# Patient Record
Sex: Female | Born: 1940
Health system: Southern US, Community
[De-identification: ages and names within clinical notes are randomized; demographics above are authoritative.]

## PROBLEM LIST (undated history)

## (undated) DIAGNOSIS — I10 Essential (primary) hypertension: Secondary | ICD-10-CM

## (undated) DIAGNOSIS — M1990 Unspecified osteoarthritis, unspecified site: Secondary | ICD-10-CM

## (undated) DIAGNOSIS — I639 Cerebral infarction, unspecified: Secondary | ICD-10-CM

## (undated) DIAGNOSIS — E7800 Pure hypercholesterolemia, unspecified: Secondary | ICD-10-CM

## (undated) HISTORY — PX: REPLACEMENT TOTAL KNEE: SUR1224

## (undated) HISTORY — DX: Cerebral infarction, unspecified: I63.9

---

## 1999-07-06 ENCOUNTER — Other Ambulatory Visit: Admission: RE | Admit: 1999-07-06 | Discharge: 1999-07-06 | Payer: Self-pay | Admitting: Family Medicine

## 2000-09-09 ENCOUNTER — Other Ambulatory Visit: Admission: RE | Admit: 2000-09-09 | Discharge: 2000-09-09 | Payer: Self-pay | Admitting: Family Medicine

## 2001-02-13 ENCOUNTER — Encounter: Payer: Self-pay | Admitting: Family Medicine

## 2001-02-13 ENCOUNTER — Encounter: Admission: RE | Admit: 2001-02-13 | Discharge: 2001-02-13 | Payer: Self-pay | Admitting: Family Medicine

## 2001-10-09 ENCOUNTER — Encounter: Admission: RE | Admit: 2001-10-09 | Discharge: 2001-10-09 | Payer: Self-pay | Admitting: Family Medicine

## 2001-10-09 ENCOUNTER — Encounter: Payer: Self-pay | Admitting: Family Medicine

## 2005-09-22 ENCOUNTER — Other Ambulatory Visit: Admission: RE | Admit: 2005-09-22 | Discharge: 2005-09-22 | Payer: Self-pay | Admitting: Family Medicine

## 2006-04-01 ENCOUNTER — Encounter: Admission: RE | Admit: 2006-04-01 | Discharge: 2006-04-01 | Payer: Self-pay | Admitting: Family Medicine

## 2006-05-03 ENCOUNTER — Ambulatory Visit (HOSPITAL_COMMUNITY): Admission: RE | Admit: 2006-05-03 | Discharge: 2006-05-03 | Payer: Self-pay | Admitting: Gastroenterology

## 2006-12-12 ENCOUNTER — Other Ambulatory Visit: Admission: RE | Admit: 2006-12-12 | Discharge: 2006-12-12 | Payer: Self-pay | Admitting: Family Medicine

## 2007-05-16 ENCOUNTER — Encounter: Admission: RE | Admit: 2007-05-16 | Discharge: 2007-05-16 | Payer: Self-pay | Admitting: Family Medicine

## 2007-08-29 ENCOUNTER — Inpatient Hospital Stay (HOSPITAL_COMMUNITY): Admission: RE | Admit: 2007-08-29 | Discharge: 2007-09-03 | Payer: Self-pay | Admitting: Orthopedic Surgery

## 2008-05-15 ENCOUNTER — Ambulatory Visit (HOSPITAL_BASED_OUTPATIENT_CLINIC_OR_DEPARTMENT_OTHER): Admission: RE | Admit: 2008-05-15 | Discharge: 2008-05-16 | Payer: Self-pay | Admitting: Orthopedic Surgery

## 2008-06-25 ENCOUNTER — Encounter: Admission: RE | Admit: 2008-06-25 | Discharge: 2008-06-25 | Payer: Self-pay | Admitting: Family Medicine

## 2008-12-20 ENCOUNTER — Other Ambulatory Visit: Admission: RE | Admit: 2008-12-20 | Discharge: 2008-12-20 | Payer: Self-pay | Admitting: Family Medicine

## 2009-07-07 ENCOUNTER — Encounter: Admission: RE | Admit: 2009-07-07 | Discharge: 2009-07-07 | Payer: Self-pay | Admitting: Family Medicine

## 2009-09-16 ENCOUNTER — Ambulatory Visit: Payer: Self-pay | Admitting: Gynecology

## 2009-10-06 ENCOUNTER — Ambulatory Visit: Payer: Self-pay | Admitting: Gynecology

## 2009-10-09 ENCOUNTER — Ambulatory Visit (HOSPITAL_BASED_OUTPATIENT_CLINIC_OR_DEPARTMENT_OTHER): Admission: RE | Admit: 2009-10-09 | Discharge: 2009-10-09 | Payer: Self-pay | Admitting: Gynecology

## 2009-10-09 ENCOUNTER — Ambulatory Visit: Payer: Self-pay | Admitting: Gynecology

## 2009-10-23 ENCOUNTER — Ambulatory Visit: Payer: Self-pay | Admitting: Gynecology

## 2010-07-16 ENCOUNTER — Encounter: Admission: RE | Admit: 2010-07-16 | Discharge: 2010-07-16 | Payer: Self-pay | Admitting: Family Medicine

## 2010-11-16 ENCOUNTER — Encounter: Payer: Self-pay | Admitting: Family Medicine

## 2011-03-09 NOTE — Op Note (Signed)
Tammy Arnold, Tammy Arnold               ACCOUNT NO.:  [id number]   MEDICAL RECORD NO.:  [id number]          PATIENT TYPE:  INP   LOCATION:  0012                         FACILITY:  [facility]   PHYSICIAN:  Marlowe Kays, M.D.  DATE OF BIRTH:  07/10/1941   DATE OF PROCEDURE:  08/29/2007  DATE OF DISCHARGE:                               OPERATIVE REPORT   PREOPERATIVE DIAGNOSIS:  Osteoarthritis, right knee.   POSTOPERATIVE DIAGNOSIS:  Osteoarthritis, right knee.   OPERATION:  Osteonics total knee replacement, right.   SURGEON:  Marlowe Kays, M.D.   ASSISTANTDruscilla Brownie. Underwood, P.A.-C.   ANESTHESIA:  Spinal.   JUSTIFICATION FOR PROCEDURE:  She had tricompartmental arthritis but  mainly in the medial joint.  There was almost bone-on-bone abutment. She  had had a total complement of nonsurgical treatment including viscous  supplementation.   DESCRIPTION OF PROCEDURE:  Prophylactic antibiotics, satisfactory spinal  anesthesia, Foley catheter inserted.  Lateral hip stabilizer, pneumatic  tourniquet, a time out performed.  The right leg was prepped with  DuraPrep from tourniquet to ankle and draped in a sterile field.  Ioban  employed.  Vertical midline incision down to the patellar mechanism with  median parapatellar incision to open the joint.  Undermining the pes  anserinus and medial collateral ligament, freed up the patellar  mechanism, and was then able to evert the patella and flex the knee.  Small osteophytes from around the patella and the femur were removed.  She had severe wear in the medial femoral condyle in particular, and  also a good bit of patellar and lateral compartment wear, as depicted on  x-ray. I removed remnants of the menisci, ACL, and PCL complex. I then  made 5/16 drill hole in the distal femur followed by the canal finder  and the axis liner set for 5 degrees valgus cut.  She had a substantial  flexion contracture and I elected to take 12 mm off the distal  femur.  I  then used the sizing jig and using the jig, she measured out at a size 9  for the femur but clearly this was too large a prosthesis for her femur  based on placing a trial against the distal femur since it overlapped  medially and laterally where as a 7 was a perfect size.  Accordingly, I  elected to use the guide for making the cuts for a 7 prosthesis and  after placing the scribe lines on the distal femur with the guide, I  then used a distal cutting jig and made anterior and posterior cuts and  posterior anterior chamfers. The anterior cut did necessitate taking a  small amount of cortical femur in order to accommodate the 7 size on the  distal femur.  I then went to the tibia where I made a leveling cut,  sized the tibia at #7, and did my initial intramedullary drill hole  followed by step cut drill canal finder, and then placed the  intramedullary rod and the cutting jig set for 90 degrees cut 4 mm off  the depressed medial tibial plateau. After making  this cut, I then  placed a laminar spreader to remove remnants of bone and soft tissue  from behind the condyles and then placed the jig for creating the  patellar groove and also the box cut for the post.  I first used a saw  to remove some bone from the intercondylar area to minimize the risk of  fracture.  After creating the post hole, I then went to the patella  which was sized at 26.  I used a 10 mm recessed jig followed by the  guide for creating the three fixation holes.  I then placed the trial  patella and trimmed bone around the perimeter.  I then returned to the  tibia and we had previously gone through a trial reduction and found a  10 spacer was a little too thick and I re-cut an additional 2 mm off the  proximal tibia and the 10 mm spacer was perfect.  I used the external  guide splitting the bimalleolar distance and put scribe lines on the  anterior tibia for the base plate.  This was base plate was then  applied  and using the tripod apparatus, I reamed up to a 7 cemented. She did not  have a significant varus deformity but because of her medial compartment  narrowing and some osteopenia, I felt that a small stem would facilitate  stability and I elected to go with a 40 mm stem because of her size.  I  used the reamer to ream for this and then went through a trial reduction  and found this fit nicely.  I water picked the knee while the components  were assembled and the 40 mm extension was placed on the tibial  component.  I then glued in the components individually, starting first  with the tibia, impacting, and removing all methyl methacrylate from  around the perimeter.  I then followed this with the femur and held the  knee in extension with the 10 mm spacer as we glued in the patella which  I held with a patellar holding clamp.  When the methacrylate had  hardened, we removed small amounts of methacrylate from around the  components and I went through a trial reduction with the 12 mm spacer  which left a little bit of a flexion contracture, so I felt that the 10  mm spacer was the correct thickness. Accordingly, after irrigating the  wound well and checking to be sure that there were no remaining  follicles of methacrylate remaining to interfere with mechanics, I  placed the final 10 mm posterior stabilized spacer, reduced the knee,  found it to be nice and stable, a small lateral release was performed, a  Hemovac inserted, and the wound closed in multiple layers with #1 Vicryl  in two layers in the quadriceps tendon and distally in two layers with  the synovium and the capsule.  The subcutaneous tissue was closed with a  combination of #1 and 2-0 Vicryl, staples in the skin.  A dry sterile  dressing was applied.  It should be noted that I placed bone wax on all  of the bone prior to closure.  The tourniquet was then released with  slightly less than 2 hours of tourniquet time having  elapsed.  She  tolerated the procedure well and was taken to the recovery room in  satisfactory condition with no known complications.           ______________________________  Marlowe Kays, M.D.  JA/MEDQ  D:  08/29/2007  T:  08/29/2007  Job:  [id number]

## 2011-03-09 NOTE — H&P (Signed)
Tammy Arnold, Tammy Arnold               ACCOUNT NO.:  [id number]   MEDICAL RECORD NO.:  [id number]          PATIENT TYPE:  INP   LOCATION:  NA                           FACILITY:  [facility]   PHYSICIAN:  Marlowe Kays, M.D.  DATE OF BIRTH:  07/23/1941   DATE OF ADMISSION:  08/29/2007  DATE OF DISCHARGE:                              HISTORY & PHYSICAL   CHIEF COMPLAINT:  Pain in my right knee.   PRESENT ILLNESS:  70 year old lady who had been seen by Dr. Simonne Come  for continuing progressive problems concerning her right knee.  She has  had progressive deterioration of the joint with degenerative changes,  developing a rather severe and interfering osteoarthritis.  X-rays have  shown significant changes in the articulating surfaces.  After much  discussion, including the risks and benefits of surgery, it was decided  to go ahead with total knee replacement arthroplasty to the right knee.   PAST MEDICAL HISTORY:  This lady has been in relatively good health  throughout her lifetime.  She does have some mild hypertension being  treated by Dr. Maurice Small.   ALLERGIES:  PENICILLIN.   CURRENT MEDICATIONS:  1. Aspirin 81 mg daily (will stop prior to surgery).  2. Actonel 35 mg daily,  3. Vitamins.   FAMILY HISTORY:  Positive for heart disease and hypertension.   SOCIAL HISTORY:  The patient is married, retired, and has three  children.  No intake of alcohol or tobacco products.   REVIEW OF SYSTEMS:  CNS:  No seizures, paralysis, numbness, or double  vision.  RESPIRATORY:  No productive cough, no hemoptysis, no shortness  of breath.  CARDIOVASCULAR:  No chest pain, no angina, no orthopnea.  GASTROINTESTINAL:  No nausea, vomiting, melena, or bloody stool.  GENITOURINARY:  No discharge, dysuria, hematuria.  MUSCULOSKELETAL:  Primarily in present illness with her knee.   PHYSICAL EXAMINATION:  GENERAL:  Alert, cooperative, friendly 66-year-  old white female looking younger than her  stated age.  VITAL SIGNS:  Blood pressure 140/78, pulse 86, respirations 12.  She is  somewhat anxious today.  HEENT:  Normocephalic.  PERRLA.  EOM intact.  Oropharynx is clear.  CHEST:  Clear to auscultation.  No rhonchi or rales.  HEART:  Regular rate and rhythm.  No murmurs are heard.  ABDOMEN:  Soft, nontender.  Liver or spleen not felt.  GENITALIA, RECTAL, PELVIC, BREASTS:  Not done, not pertinent to present  illness.  EXTREMITIES:  The patient has painful range of motion of the right knee  with crepitus.   ADMISSION DIAGNOSES:  1. Osteoarthritis, right knee.  2. Hypertension.   PLAN:  The patient will be admitted for total knee replacement  arthroplasty of the right knee.  We will order appropriate durable  medical goods that she will need at home during her hospitalization.      [name] [name].    ______________________________  Marlowe Kays, M.D.    DLU/MEDQ  D:  08/22/2007  T:  08/22/2007  Job:  [id number]   cc:   Gretta Arab. Valentina Lucks, M.D.  Fax: [phone]

## 2011-03-09 NOTE — Op Note (Signed)
Tammy Arnold, Tammy Arnold               ACCOUNT NO.:  [id number]   MEDICAL RECORD NO.:  [id number]          PATIENT TYPE:  AMB   LOCATION:  NESC                         FACILITY:  [facility]   PHYSICIAN:  Marlowe Kays, M.D.  DATE OF BIRTH:  19-Jun-1941   DATE OF PROCEDURE:  05/15/2008  DATE OF DISCHARGE:                               OPERATIVE REPORT   PREOPERATIVE DIAGNOSES:  1. Chronic impingement syndrome with rotator cuff tendinopathy.  2. Osteoarthritis, acromioclavicular joint, left clavicle.   POSTOPERATIVE DIAGNOSIS:  1. Chronic impingement syndrome with rotator cuff tendinopathy.  2. Osteoarthritis, acromioclavicular joint, left clavicle.   OPERATION:  1. Left shoulder arthroscopy (normal examination).  2. Arthroscopic subacromial decompression with shaving of the rotator      cuff bursal surface.  3. Open resection, distal clavicle, left shoulder.   SURGEON:  Marlowe Kays, M.D.   ASSISTANT:  Mr. Idolina Primer, [place].   ANESTHESIA:  General.   PATHOLOGY AND INDICATIONS FOR PROCEDURE:  Chronic progressive pain in  left shoulder with an MRI demonstrating rotator cuff tendinopathy, a  type 2 acromion and fairly pronounced arthritic changes in the [facility] joint.  Consequently she is here for the above-mentioned surgery.   PROCEDURE:  Prophylactic antibiotics.  She has had a total knee  replacement.  Under satisfactory general anesthesia in the beach-chair  position on the sliding frame, the left shoulder girdle was prepped with  DuraPrep and draped in a sterile field.  Anatomy of the shoulder joint  was marked out and posterior and lateral portal sites marked.  After  performing a time-out these 2 portals and subacromial space were all  injected with 0.5% Marcaine with adrenaline.  Through a posterior Soft  Spot portal I atraumatically entered the glenohumeral joint.  This was  normal on examination.  Representative pictures were taken.  I then  redirected the scope to the  subacromial space and through the lateral  portal introduced the 4.2 shaver, preceded by a blunt trocar.  She had a  good bit of bursitis and roughening of the bursal surface of the rotator  cuff.  I cleared out most of bursal tissue with a shaver and gently  smoothed down the rotator cuff.  I then used the 90-degree ArthroCare  vaporizer to begin removing soft tissue from the undersurface of the  distal acromion back to the [facility] joint, which was identified.  I followed  this with a 4-mm oval bur, burring down the undersurface of the  acromion.  I then utilized these 3 instruments, going back and forth  until the bursal surface of the rotator cuff was reasonably smooth and  there was wide decompression of the subacromial space based on pictures  with the arm to his side and the arm abducted with the vaporizer in  place.  I then removed all fluid possible from the subacromial space and  I made an open incision on the distal clavicle, which was identified  with subperiosteal dissection.  I measured 1.5 cm from the [facility] joint,  marked the clavicle there, and then undermining it, used a micro saw to  cut the  clavicle at this spot.  I then removed the cut portion with a  towel clip and cautery technique.  Several small spicules of bone were  removed from the distal surface of the parent clavicle.  I then placed  bone wax over the cut surface of the clavicle and irrigated the gap well  with sterile saline and placed Gelfoam in the resection site.  I then  closed the fascia over the top of this with interrupted 2-0 Vicryl and  the same in the subcutaneous tissue, with Steri-Strips on the skin and 4-  0 nylon in the 2 portal incisions and the 2 portals and subacromial  space once again were infiltrated with 0.5% Marcaine with adrenaline.  A  dry sterile dressing was applied, followed by a shoulder immobilizer.  She tolerated the procedure well and was taken to the recovery room in  satisfactory  condition with no known complications.           ______________________________  Marlowe Kays, M.D.     JA/MEDQ  D:  05/15/2008  T:  05/15/2008  Job:  [id number]

## 2011-03-12 NOTE — Discharge Summary (Signed)
Tammy Arnold, CERINO               ACCOUNT NO.:  [id number]   MEDICAL RECORD NO.:  [id number]          PATIENT TYPE:  INP   LOCATION:  1613                         [facility]ACILITY:  [facility]   PHYSICIAN:  Marlowe Kays, M.D.  DATE OF BIRTH:  September 14, 1941   DATE OF ADMISSION:  08/29/2007  DATE OF DISCHARGE:  09/03/2007                               DISCHARGE SUMMARY   ADMITTING DIAGNOSES:  1. Osteoarthritis of the right knee.  2. Hypertension.   DISCHARGE DIAGNOSES:  1. Osteoarthritis of the right knee.  2. Hypertension.  3. Postoperative anemia, treated with transfusion.   OPERATION:  On August 29, 2007, the patient underwent an Osteonics  total knee replacement arthroplasty of the right knee.  [name]  Idolina Primer, P.A.-C, assisted.   BRIEF HISTORY:  This is a 70 year old lady with progressive problems  concerning the right knee.  She has tricompartmental arthritis by  examination and x-ray but mainly in the medial joint with a near bone-on-  bone deformity.  This pain and limitation of activity is limiting her  day-to-day pleasures and so after much discussion including risks and  benefits of surgery, it was decided she would benefit with the above  procedure and was admitted for same.   COURSE IN THE HOSPITAL:  The patient tolerated the surgical procedure  quite well.  She could weightbear as tolerated.  Neurovascular remained  intact to the operative extremity.  The Hemovac was pulled the first day  and then a dressing change the second day.  She worked very slowly with  physical therapy but did achieve ambulating in the hall.  She was placed  on Coumadin protocol postoperatively for prevention of DVT.  She ran a  slight temperature of 101 on September 01, 2007; however, the lungs were  clear.  Dr. Simonne Come started her on Keflex and she was eventually  discharged on same.  The patient's hemoglobin dropped to 7.0.  she was  transfused with packed cells, bringing her hemoglobin up to  9.2 with a  hematocrit of 27.0.   The patient continued to work with physical therapy, feeling much better  after her transfusion.  Dr. Simonne Come saw her the day of discharge.  She  was mobile, the wound was clear, and it was felt she could be maintained  in the home environment with home health.   Laboratory values in the hospital hematologically showed a preoperative  CBC completely within normal limits.  Hemoglobin was 13.0, hematocrit  was 38.5.  her hemoglobin dropped to 7.0 prior to her transfusion and  afterward the hemoglobin was 9.2 and hematocrit was 27.0.  Blood  chemistries were normal.  INR was 1.7 at discharge.  Blood chemistries  remained normal other than very slight elevated glucose at 136.  Urinalysis negative for urinary tract infection.  Cultures were all  negative.  Electrocardiogram showed normal sinus rhythm.  No chest x-ray  seen on this chart.   CONDITION ON DISCHARGE:  Improved, stable.   PLAN:  The patient is discharged to her home in the care of her family.  She may continue weightbearing as tolerated.  Return to see [place] 2 weeks  after the date of surgery.   MEDICATIONS AT DISCHARGE:  Actonel 35 mg one per week, per her family  physician.   Our medications are Vicodin for pain, Robaxin as a muscle relaxant,  Coumadin for anticoagulation therapy to be done for 4 weeks after the  date of surgery, ferrous sulfate for blood replacement, Keflex as an  antibiotic.   Use dry dressing as indicated.  They are urged to call should they hae  any problems or questions at home.  Continue with her diet that she  enjoyed prior to surgery.      [name] Tammy Arnold.    ______________________________  Marlowe Kays, M.D.    DLU/MEDQ  D:  09/27/2007  T:  09/27/2007  Job:  [id number]   cc:   Gretta Arab. Valentina Lucks, M.D.  Fax: [phone]

## 2011-06-11 ENCOUNTER — Other Ambulatory Visit: Payer: Self-pay | Admitting: Family Medicine

## 2011-06-11 DIAGNOSIS — Z1231 Encounter for screening mammogram for malignant neoplasm of breast: Secondary | ICD-10-CM

## 2011-07-23 LAB — POCT I-STAT 4, (NA,K, GLUC, HGB,HCT)
Glucose, Bld: 86
HCT: 44
Operator id: 268271

## 2011-07-27 ENCOUNTER — Ambulatory Visit
Admission: RE | Admit: 2011-07-27 | Discharge: 2011-07-27 | Disposition: A | Discharge status: Home / Self Care | Payer: PRIVATE HEALTH INSURANCE | Source: Ambulatory Visit | Attending: Family Medicine | Admitting: Family Medicine

## 2011-07-27 DIAGNOSIS — Z1231 Encounter for screening mammogram for malignant neoplasm of breast: Secondary | ICD-10-CM

## 2011-08-03 LAB — CROSSMATCH
ABO/RH(D): A POS
ABO/RH(D): A POS
Antibody Screen: NEGATIVE

## 2011-08-03 LAB — BASIC METABOLIC PANEL
CO2: 27
Chloride: 102
GFR calc Af Amer: >60
Glucose, Bld: 136 — ABNORMAL HIGH
Sodium: 135

## 2011-08-03 LAB — PROTIME-INR
INR: 1.3
INR: 1.7 — ABNORMAL HIGH
INR: 1.7 — ABNORMAL HIGH
Prothrombin Time: 20.3 — ABNORMAL HIGH

## 2011-08-03 LAB — CULTURE, BLOOD (ROUTINE X 2)
Culture: NO GROWTH
Culture: NO GROWTH

## 2011-08-03 LAB — CBC
HCT: 21.8 — ABNORMAL LOW
HCT: 21.8 — ABNORMAL LOW
HCT: 23.6 — ABNORMAL LOW
Hemoglobin: 7.5 — CL
Hemoglobin: 7.7 — CL
Hemoglobin: 8.4 — ABNORMAL LOW
MCHC: 35.1
MCHC: 35.6
MCV: 86.9
MCV: 87.1
MCV: 90.1
Platelets: 108 — ABNORMAL LOW
RBC: 2.5 — ABNORMAL LOW
RBC: 3.13 — ABNORMAL LOW
RDW: 13.1
RDW: 13.1
RDW: 13.5
WBC: 7.3

## 2011-08-03 LAB — HEPARIN ANTIBODY SCREEN

## 2011-08-03 LAB — PREPARE RBC (CROSSMATCH)

## 2011-08-04 LAB — COMPREHENSIVE METABOLIC PANEL
ALT: 16
AST: 18
Alkaline Phosphatase: 62
CO2: 28
Calcium: 9.8
Chloride: 106
GFR calc Af Amer: >60
GFR calc non Af Amer: >60
Glucose, Bld: 94
Potassium: 4.3
Sodium: 143
Total Bilirubin: 0.7

## 2011-08-04 LAB — URINALYSIS, ROUTINE W REFLEX MICROSCOPIC
Glucose, UA: NEGATIVE
Ketones, ur: NEGATIVE
Specific Gravity, Urine: 1.01
pH: 7

## 2011-08-04 LAB — PROTIME-INR
INR: 1
Prothrombin Time: 13.6

## 2011-08-04 LAB — DIFFERENTIAL
Basophils Absolute: 0
Basophils Relative: 0
Eosinophils Absolute: 0.1
Eosinophils Relative: 2
Neutrophils Relative %: 64

## 2011-08-04 LAB — CBC
Hemoglobin: 13
MCHC: 33.9
RBC: 4.36
WBC: 6.2

## 2012-07-13 ENCOUNTER — Other Ambulatory Visit: Payer: Self-pay | Admitting: Family Medicine

## 2012-07-13 DIAGNOSIS — Z1231 Encounter for screening mammogram for malignant neoplasm of breast: Secondary | ICD-10-CM

## 2012-08-15 ENCOUNTER — Ambulatory Visit
Admission: RE | Admit: 2012-08-15 | Discharge: 2012-08-15 | Disposition: A | Discharge status: Home / Self Care | Payer: PRIVATE HEALTH INSURANCE | Source: Ambulatory Visit | Attending: Family Medicine | Admitting: Family Medicine

## 2012-08-15 DIAGNOSIS — Z1231 Encounter for screening mammogram for malignant neoplasm of breast: Secondary | ICD-10-CM

## 2013-07-09 ENCOUNTER — Other Ambulatory Visit: Payer: Self-pay

## 2013-07-09 DIAGNOSIS — Z1231 Encounter for screening mammogram for malignant neoplasm of breast: Secondary | ICD-10-CM

## 2013-08-21 ENCOUNTER — Ambulatory Visit
Admission: RE | Admit: 2013-08-21 | Discharge: 2013-08-21 | Disposition: A | Discharge status: Home / Self Care | Payer: BC Managed Care – PPO | Source: Ambulatory Visit

## 2013-08-21 DIAGNOSIS — Z1231 Encounter for screening mammogram for malignant neoplasm of breast: Secondary | ICD-10-CM

## 2014-10-31 ENCOUNTER — Other Ambulatory Visit: Payer: Self-pay

## 2014-10-31 DIAGNOSIS — Z1231 Encounter for screening mammogram for malignant neoplasm of breast: Secondary | ICD-10-CM

## 2014-11-07 ENCOUNTER — Ambulatory Visit
Admission: RE | Admit: 2014-11-07 | Discharge: 2014-11-07 | Disposition: A | Discharge status: Home / Self Care | Payer: Commercial Indemnity | Source: Ambulatory Visit

## 2014-11-07 DIAGNOSIS — Z1231 Encounter for screening mammogram for malignant neoplasm of breast: Secondary | ICD-10-CM

## 2015-07-08 ENCOUNTER — Other Ambulatory Visit: Payer: Self-pay | Admitting: Orthopedic Surgery

## 2015-07-08 DIAGNOSIS — M419 Scoliosis, unspecified: Secondary | ICD-10-CM

## 2015-07-18 ENCOUNTER — Ambulatory Visit
Admission: RE | Admit: 2015-07-18 | Discharge: 2015-07-18 | Disposition: A | Discharge status: Home / Self Care | Payer: Commercial Indemnity | Source: Ambulatory Visit | Attending: Orthopedic Surgery | Admitting: Orthopedic Surgery

## 2015-07-18 DIAGNOSIS — M419 Scoliosis, unspecified: Secondary | ICD-10-CM

## 2015-07-18 MED ORDER — IOHEXOL 180 MG/ML  SOLN
17.0000 mL | Freq: Once | INTRAMUSCULAR | Status: DC | PRN
  Administered 2015-07-18: 17 mL via INTRATHECAL

## 2015-07-18 MED ORDER — DIAZEPAM 5 MG PO TABS
5.0000 mg | ORAL_TABLET | Freq: Once | ORAL | Status: AC
Start: 2015-07-18 — End: 2015-07-18
  Administered 2015-07-18: 5 mg via ORAL

## 2015-07-18 NOTE — Discharge Instructions (Addendum)

## 2015-12-07 ENCOUNTER — Encounter (HOSPITAL_COMMUNITY): Payer: Self-pay | Admitting: Emergency Medicine

## 2015-12-07 DIAGNOSIS — Z880 Allergy status to penicillin: Secondary | ICD-10-CM | POA: Insufficient documentation

## 2015-12-07 DIAGNOSIS — I10 Essential (primary) hypertension: Secondary | ICD-10-CM | POA: Diagnosis not present

## 2015-12-07 DIAGNOSIS — Y9289 Other specified places as the place of occurrence of the external cause: Secondary | ICD-10-CM | POA: Diagnosis not present

## 2015-12-07 DIAGNOSIS — L539 Erythematous condition, unspecified: Secondary | ICD-10-CM | POA: Insufficient documentation

## 2015-12-07 DIAGNOSIS — N390 Urinary tract infection, site not specified: Secondary | ICD-10-CM | POA: Insufficient documentation

## 2015-12-07 DIAGNOSIS — Z8639 Personal history of other endocrine, nutritional and metabolic disease: Secondary | ICD-10-CM | POA: Diagnosis not present

## 2015-12-07 DIAGNOSIS — S39012A Strain of muscle, fascia and tendon of lower back, initial encounter: Secondary | ICD-10-CM | POA: Insufficient documentation

## 2015-12-07 DIAGNOSIS — Y9389 Activity, other specified: Secondary | ICD-10-CM | POA: Insufficient documentation

## 2015-12-07 DIAGNOSIS — Y998 Other external cause status: Secondary | ICD-10-CM | POA: Diagnosis not present

## 2015-12-07 DIAGNOSIS — X58XXXA Exposure to other specified factors, initial encounter: Secondary | ICD-10-CM | POA: Insufficient documentation

## 2015-12-07 DIAGNOSIS — M5440 Lumbago with sciatica, unspecified side: Secondary | ICD-10-CM | POA: Diagnosis not present

## 2015-12-07 DIAGNOSIS — R109 Unspecified abdominal pain: Secondary | ICD-10-CM | POA: Diagnosis present

## 2015-12-07 DIAGNOSIS — K5900 Constipation, unspecified: Secondary | ICD-10-CM | POA: Insufficient documentation

## 2015-12-07 DIAGNOSIS — M62830 Muscle spasm of back: Secondary | ICD-10-CM | POA: Diagnosis not present

## 2015-12-07 DIAGNOSIS — M4306 Spondylolysis, lumbar region: Secondary | ICD-10-CM | POA: Diagnosis not present

## 2015-12-07 LAB — COMPREHENSIVE METABOLIC PANEL
ALK PHOS: 49 U/L (ref 38–126)
ALT: 14 U/L (ref 14–54)
AST: 22 U/L (ref 15–41)
Albumin: 3.3 g/dL — ABNORMAL LOW (ref 3.5–5.0)
Anion gap: 11 (ref 5–15)
BILIRUBIN TOTAL: 0.1 mg/dL — AB (ref 0.3–1.2)
BUN: 19 mg/dL (ref 6–20)
CALCIUM: 8.7 mg/dL — AB (ref 8.9–10.3)
CO2: 24 mmol/L (ref 22–32)
CREATININE: 0.72 mg/dL (ref 0.44–1.00)
Chloride: 105 mmol/L (ref 101–111)
GFR calc non Af Amer: >60 mL/min (ref >60)
GLUCOSE: 151 mg/dL — AB (ref 65–99)
Potassium: 3.8 mmol/L (ref 3.5–5.1)
SODIUM: 140 mmol/L (ref 135–145)
Total Protein: 6.4 g/dL — ABNORMAL LOW (ref 6.5–8.1)

## 2015-12-07 LAB — CBC
HCT: 33.6 % — ABNORMAL LOW (ref 36.0–46.0)
Hemoglobin: 10.8 g/dL — ABNORMAL LOW (ref 12.0–15.0)
MCH: 27.6 pg (ref 26.0–34.0)
MCHC: 32.1 g/dL (ref 30.0–36.0)
MCV: 85.7 fL (ref 78.0–100.0)
PLATELETS: 252 10*3/uL (ref 150–400)
RBC: 3.92 MIL/uL (ref 3.87–5.11)
RDW: 13.4 % (ref 11.5–15.5)
WBC: 8.8 10*3/uL (ref 4.0–10.5)

## 2015-12-07 LAB — LIPASE, BLOOD: Lipase: 41 U/L (ref 11–51)

## 2015-12-07 NOTE — ED Notes (Signed)
Pt c/o L flank pain that moved to the center of her back. x2 days.

## 2015-12-08 ENCOUNTER — Emergency Department (HOSPITAL_COMMUNITY)
Admission: EM | Admit: 2015-12-08 | Discharge: 2015-12-08 | Disposition: A | Discharge status: Home / Self Care | Payer: Managed Care, Other (non HMO) | Attending: Emergency Medicine | Admitting: Emergency Medicine

## 2015-12-08 ENCOUNTER — Encounter (HOSPITAL_COMMUNITY): Payer: Self-pay | Admitting: Emergency Medicine

## 2015-12-08 ENCOUNTER — Emergency Department (HOSPITAL_COMMUNITY): Payer: Managed Care, Other (non HMO)

## 2015-12-08 DIAGNOSIS — M5440 Lumbago with sciatica, unspecified side: Secondary | ICD-10-CM | POA: Diagnosis not present

## 2015-12-08 DIAGNOSIS — N390 Urinary tract infection, site not specified: Secondary | ICD-10-CM

## 2015-12-08 DIAGNOSIS — M62830 Muscle spasm of back: Secondary | ICD-10-CM | POA: Diagnosis not present

## 2015-12-08 DIAGNOSIS — M4306 Spondylolysis, lumbar region: Secondary | ICD-10-CM

## 2015-12-08 DIAGNOSIS — R52 Pain, unspecified: Secondary | ICD-10-CM

## 2015-12-08 DIAGNOSIS — S39012A Strain of muscle, fascia and tendon of lower back, initial encounter: Secondary | ICD-10-CM

## 2015-12-08 DIAGNOSIS — K5900 Constipation, unspecified: Secondary | ICD-10-CM

## 2015-12-08 HISTORY — DX: Essential (primary) hypertension: I10

## 2015-12-08 HISTORY — DX: Pure hypercholesterolemia, unspecified: E78.00

## 2015-12-08 LAB — URINALYSIS, ROUTINE W REFLEX MICROSCOPIC
BILIRUBIN URINE: NEGATIVE
Glucose, UA: NEGATIVE mg/dL
KETONES UR: NEGATIVE mg/dL
Leukocytes, UA: NEGATIVE
Nitrite: NEGATIVE
PH: 5.5 (ref 5.0–8.0)
Protein, ur: NEGATIVE mg/dL
SPECIFIC GRAVITY, URINE: 1.011 (ref 1.005–1.030)

## 2015-12-08 LAB — URINE MICROSCOPIC-ADD ON

## 2015-12-08 MED ORDER — GADOBENATE DIMEGLUMINE 529 MG/ML IV SOLN
10.0000 mL | Freq: Once | INTRAVENOUS | Status: AC | PRN
  Administered 2015-12-08: 10 mL via INTRAVENOUS

## 2015-12-08 MED ORDER — POLYETHYLENE GLYCOL 3350 17 GM/SCOOP PO POWD: 17.0000 g | Freq: Every day | ORAL | Status: DC

## 2015-12-08 MED ORDER — DICLOFENAC SODIUM ER 100 MG PO TB24: 100.0000 mg | ORAL_TABLET | Freq: Every day | ORAL | Status: DC

## 2015-12-08 MED ORDER — METHOCARBAMOL 500 MG PO TABS
1000.0000 mg | ORAL_TABLET | Freq: Once | ORAL | Status: AC
  Administered 2015-12-08: 1000 mg via ORAL
  Filled 2015-12-08: qty 2

## 2015-12-08 MED ORDER — METHOCARBAMOL 500 MG PO TABS: 500.0000 mg | ORAL_TABLET | Freq: Two times a day (BID) | ORAL | Status: DC

## 2015-12-08 MED ORDER — BACLOFEN 10 MG PO TABS: ORAL_TABLET | ORAL | Status: DC

## 2015-12-08 MED ORDER — KETOROLAC TROMETHAMINE 30 MG/ML IJ SOLN
30.0000 mg | Freq: Once | INTRAMUSCULAR | Status: AC
  Administered 2015-12-08: 30 mg via INTRAVENOUS
  Filled 2015-12-08: qty 1

## 2015-12-08 MED ORDER — FOSFOMYCIN TROMETHAMINE 3 G PO PACK
3.0000 g | PACK | Freq: Once | ORAL | Status: AC
  Administered 2015-12-08: 3 g via ORAL
  Filled 2015-12-08: qty 3

## 2015-12-08 NOTE — Discharge Instructions (Signed)
Do not take robaxen, take baclofen as needed for muscle spasms. Discuss physical therapy and back brace LSO with primary doctor.   If you were given medicines take as directed.  If you are on coumadin or contraceptives realize their levels and effectiveness is altered by many different medicines.  If you have any reaction (rash, tongues swelling, other) to the medicines stop taking and see a physician.    If your blood pressure was elevated in the ER make sure you follow up for management with a primary doctor or return for chest pain, shortness of breath or stroke symptoms.  Please follow up as directed and return to the ER or see a physician for new or worsening symptoms.  Thank you. Filed Vitals:   12/07/15 2232 12/08/15 0430 12/08/15 0638  BP: 136/54 119/60 126/57  Pulse: 89 65 69  Temp: 100.7 F (38.2 C)  98.3 F (36.8 C)  TempSrc: Oral  Oral  Resp: 18  16  SpO2: 96% 98% 98%

## 2015-12-08 NOTE — Consult Note (Signed)
Requesting Physician: Dr.  Randal Buba    Reason for consultation:  severe low back pain  HPI:                                                                                                                                         Tammy Arnold is an 75 y.o. female patient who presented  with severe low back pain for a few days. H/o chronic low back pain. Also reported flank pain.   No other focal neuro sx, no bowel or bladder problems, no leg weakness or numbness, no sx in UE or vision or speech.    Past Medical History: Past Medical History  Diagnosis Date  . High cholesterol   . Hypertension     Past Surgical History  Procedure Laterality Date  . Replacement total knee      Family History: History reviewed. No pertinent family history.  Social History:   reports that she has never smoked. She has never used smokeless tobacco. She reports that she does not drink alcohol. Her drug history is not on file.  Allergies:  Allergies  Allergen Reactions  . Penicillins     Was a long time ago; doesn't remember reaction  . Sulfa Antibiotics     Was a long time ago; doesn't remember reaction     Medications:                                                                                                                        No current facility-administered medications for this encounter.  Current outpatient prescriptions:  .  hydrochlorothiazide (HYDRODIURIL) 25 MG tablet, Take 12.5 mg by mouth daily., Disp: , Rfl: 2 .  simvastatin (ZOCOR) 20 MG tablet, Take 20 mg by mouth daily., Disp: , Rfl: 5 .  baclofen (LIORESAL) 10 MG tablet, Take 5 mg of baclofen in the morning and 10 mg at night as needed for muscle spasms., Disp: 30 each, Rfl: 0 .  Diclofenac Sodium CR (VOLTAREN-XR) 100 MG 24 hr tablet, Take 1 tablet (100 mg total) by mouth daily., Disp: 10 tablet, Rfl: 0 .  methocarbamol (ROBAXIN) 500 MG tablet, Take 1 tablet (500 mg total) by mouth 2 (two) times daily., Disp: 20 tablet,  Rfl: 0 .  polyethylene glycol powder (GLYCOLAX/MIRALAX) powder, Take 17 g by mouth daily., Disp: 255 g, Rfl: 0   ROS:  History obtained from the patient  General ROS: negative for - chills, fatigue, fever, night sweats, weight gain or weight loss Psychological ROS: negative for - behavioral disorder, hallucinations, memory difficulties, mood swings or suicidal ideation Ophthalmic ROS: negative for - blurry vision, double vision, eye pain or loss of vision ENT ROS: negative for - epistaxis, nasal discharge, oral lesions, sore throat, tinnitus or vertigo Allergy and Immunology ROS: negative for - hives or itchy/watery eyes Hematological and Lymphatic ROS: negative for - bleeding problems, bruising or swollen lymph nodes Endocrine ROS: negative for - galactorrhea, hair pattern changes, polydipsia/polyuria or temperature intolerance Respiratory ROS: negative for - cough, hemoptysis, shortness of breath or wheezing Cardiovascular ROS: negative for - chest pain, dyspnea on exertion, edema or irregular heartbeat Gastrointestinal ROS: negative for - abdominal pain, diarrhea, hematemesis, nausea/vomiting or stool incontinence Genito-Urinary ROS: negative for - dysuria, hematuria, incontinence or urinary frequency/urgency Musculoskeletal ROS: negative for - joint swelling or muscular weakness Neurological ROS: as noted in HPI Dermatological ROS: negative for rash and skin lesion changes  Neurologic Examination:                                                                                                      Blood pressure 126/57, pulse 69, temperature 98.3 F (36.8 C), temperature source Oral, resp. rate 16, SpO2 98 %.  Evaluation of higher integrative functions including: Level of alertness: Alert,  Oriented to time, place and person Speech: fluent, no evidence  of dysarthria or aphasia noted.  Test the following cranial nerves: 2-12 grossly intact Motor examination: Normal tone, bulk, full 5/5 motor strength in all 4 extremities Examination of sensation : Normal and symmetric sensation to pinprick in all 4 extremities and on face Examination of deep tendon reflexes: 2+, normal and symmetric in all extremities, normal plantars bilaterally Test coordination: Normal finger nose testing, with no evidence of limb appendicular ataxia or abnormal involuntary movements or tremors noted.  Gait:  Antalgic   Lab Results: Basic Metabolic Panel:  Recent Labs Lab 12/07/15 2251  NA 140  K 3.8  CL 105  CO2 24  GLUCOSE 151*  BUN 19  CREATININE 0.72  CALCIUM 8.7*    Liver Function Tests:  Recent Labs Lab 12/07/15 2251  AST 22  ALT 14  ALKPHOS 49  BILITOT 0.1*  PROT 6.4*  ALBUMIN 3.3*    Recent Labs Lab 12/07/15 2251  LIPASE 41   No results for input(s): AMMONIA in the last 168 hours.  CBC:  Recent Labs Lab 12/07/15 2251  WBC 8.8  HGB 10.8*  HCT 33.6*  MCV 85.7  PLT 252    Cardiac Enzymes: No results for input(s): CKTOTAL, CKMB, CKMBINDEX, TROPONINI in the last 168 hours.  Lipid Panel: No results for input(s): CHOL, TRIG, HDL, CHOLHDL, VLDL, LDLCALC in the last 168 hours.  CBG: No results for input(s): GLUCAP in the last 168 hours.  Microbiology: Results for orders placed or performed during the hospital encounter of 08/29/07  Culture, blood (routine x 2)     Status: None   Collection Time: 09/01/07  10:10 AM  Result Value Ref Range Status   Specimen Description BLOOD LEFT ARM  Final   Special Requests BOTTLES DRAWN AEROBIC AND ANAEROBIC 5CC  Final   Culture NO GROWTH 5 DAYS  Final   Report Status 09/07/2007 FINAL  Final  Culture, blood (routine x 2)     Status: None   Collection Time: 09/01/07 10:10 AM  Result Value Ref Range Status   Specimen Description BLOOD RIGHT ARM  Final   Special Requests BOTTLES DRAWN  AEROBIC AND ANAEROBIC 10CC  Final   Culture NO GROWTH 5 DAYS  Final   Report Status 09/07/2007 FINAL  Final     Imaging: Mr Lumbar Spine W Wo Contrast  12/08/2015  CLINICAL DATA:  Constant severe LEFT flank pain for 3 days.  Fever. EXAM: MRI LUMBAR SPINE WITHOUT AND WITH CONTRAST TECHNIQUE: Multiplanar and multiecho pulse sequences of the lumbar spine were obtained without and with intravenous contrast. CONTRAST:  45mL MULTIHANCE GADOBENATE DIMEGLUMINE 529 MG/ML IV SOLN COMPARISON:  CT abdomen pelvis December 08, 2015 at 0408 hours FINDINGS: Lumbar vertebral bodies intact and aligned with maintenance of lumbar lordosis. Transitional anatomy, lumbarized S1 vertebral body. Broad levoscoliosis better seen on today's CT. Moderate to severe L2-3 through L5-S1 disc height loss associated with scoliosis with decreased T2 signal within all discs compatible with desiccation. Moderate acute on chronic discogenic endplate changes [id number], mild at L3-4 and L4-5. No suspicious osseous or intradiscal enhancement. Conus medullaris terminates at L2 and is normal morphology and signal characteristics. Cauda equina is unremarkable. No abnormal spinal cord, leptomeningeal or epidural enhancement. Included prevertebral and paraspinal soft tissues are nonsuspicious, moderate paraspinal muscle atrophy. Mild bright interstitial STIR signal LEFT paraspinal soft tissues. Level by level evaluation: L1-2: Small 3 mm broad-based disc bulge. No canal stenosis or neural foraminal narrowing. L2-3: Small broad-based disc bulge asymmetric to the LEFT. Mild facet arthropathy and ligamentum flavum redundancy. Trace LEFT facet effusion is likely reactive. No canal stenosis. Encroachment upon the exited LEFT L2 nerve. No canal stenosis. Mild LEFT neural foraminal narrowing. L3-4: Small broad-based disc bulge, moderate LEFT facet arthropathy and ligamentum flavum redundancy with trace LEFT facet effusion which is likely reactive. No canal stenosis.  Mild LEFT greater than RIGHT neural foraminal narrowing. L4-5: Small broad-based disc bulge asymmetric to LEFT. Moderate facet arthropathy and ligamentum flavum redundancy with trace LEFT facet effusion which is likely reactive. No canal stenosis. Mild RIGHT greater LEFT neural foraminal narrowing. L5-S1: Small broad-based disc bulge. Severe RIGHT, mild LEFT facet arthropathy and ligamentum flavum redundancy without canal stenosis. Moderate to severe RIGHT neural foraminal narrowing, mild on the LEFT. IMPRESSION: No acute lumbar spine fracture or malalignment. Levoscoliosis better seen on today's CT. Degenerative lumbar spine without canal stenosis. Neural foraminal narrowing L2-3 through L5-S1: Moderate to severe on the RIGHT at L5-S1. Low-grade LEFT paraspinal muscle strain. Electronically Signed   By: Elon Alas M.D.   On: 12/08/2015 05:55   Ct Renal Stone Study  12/08/2015  CLINICAL DATA:  New constant left flank pain, onset 3 days ago. EXAM: CT ABDOMEN AND PELVIS WITHOUT CONTRAST TECHNIQUE: Multidetector CT imaging of the abdomen and pelvis was performed following the standard protocol without IV contrast. COMPARISON:  None. FINDINGS: Atelectasis in the lung bases.  Coronary artery calcifications. Kidneys are symmetrical in size and shape. No hydronephrosis or hydroureter. Punctate size stone in the lower pole right kidney. No stones in the left kidney. No ureteral stones or bladder stones. Bladder wall is not thickened. The  unenhanced appearance of the liver, spleen, gallbladder, pancreas, adrenal glands, inferior vena cava, abdominal aorta, and retroperitoneal lymph nodes is unremarkable. Stomach, small bowel, and colon are not abnormally distended. The colon is diffusely stool-filled suggesting constipation. No free air or free fluid in the abdomen. Abdominal wall musculature appears intact. Pelvis: The appendix is not identified. Uterus and ovaries are not enlarged. No pelvic mass or  lymphadenopathy. No free or loculated pelvic fluid collections. No evidence of diverticulitis. Degenerative changes throughout the spine. No destructive bone lesions. IMPRESSION: Nonobstructing intrarenal stone on the right. No obstructing ureteral stones identified. Electronically Signed   By: Lucienne Capers M.D.   On: 12/08/2015 04:42    Assessment and plan:   Tammy Arnold is an 75 y.o. female patient who presented with severe worsening low back pain and spasms.  CT ABDOMEN AND PELVIS WITHOUT CONTRAST showed Nonobstructing intrarenal stone on the right. No obstructing ureteral stones identified.  I recommend MRI lumbar spine. It showed severe  degenerative spine disease with scoliosis, facet arthritis, and endplate degenerative changes, post paravertebral soft tissue edema. Reviewed mri images with patient.  Recommend baclofen 5 mg in am, and 10 mg QHS to help with severe back spasms from the degenerative spine disease. Advised out patient physical therapy, and will benefit from obtaining a LSO brace through prescription from PCP office.  D/W ER physician.

## 2015-12-08 NOTE — ED Notes (Signed)
Neurology at bedside.

## 2015-12-08 NOTE — ED Notes (Signed)
Waiting for neurology to see patient before discharge.

## 2015-12-08 NOTE — ED Provider Notes (Signed)
CSN: [id number]     Arrival date & time 12/07/15  2215 History  By signing my name below, I, Tammy Arnold, attest that this documentation has been prepared under the direction and in the presence of Josip Merolla, MD. Electronically Signed: Altamease Arnold, ED Scribe. 12/08/2015. 3:47 AM   Chief Complaint  Patient presents with  . Flank Pain  . Back Pain   Patient is a 75 y.o. female presenting with flank pain. The history is provided by the patient. No language interpreter was used.  Flank Pain This is a new problem. The current episode started more than 2 days ago. The problem occurs constantly. The problem has not changed since onset.Pertinent negatives include no chest pain, no abdominal pain, no headaches and no shortness of breath. The symptoms are aggravated by walking (and laying flat). Nothing relieves the symptoms. Treatments tried: oxycodone. The treatment provided mild relief.  Tammy Arnold is a 75 y.o. female who presents to the Emergency Department complaining of new, constant, 2/10 in severity left flank pain with onset 3 days ago. The pain radiates to the center of the back. Initially she associated the pain with bursitis in her hip but notes that the radiation is new. Walking and laying flat exacerbate the pain.  She has occasionally been using her husband's oxycodone without sufficient pain relief at home. Associated symptoms include fever. Pt denies difficulty urinating, dysuria, increased frequency, hematuria, pain or difficulty passing stool, diarrhea cough, congestion, and draining wounds.    Past Medical History  Diagnosis Date  . High cholesterol   . Hypertension    Past Surgical History  Procedure Laterality Date  . Replacement total knee     No family history on file. Social History  Substance Use Topics  . Smoking status: Never Smoker   . Smokeless tobacco: Never Used     Comment: smoked very little in college none since  . Alcohol Use: No   OB  History    No data available     Review of Systems  Constitutional: Positive for fever.  Respiratory: Negative for shortness of breath.   Cardiovascular: Negative for chest pain.  Gastrointestinal: Negative for abdominal pain.  Genitourinary: Positive for flank pain. Negative for difficulty urinating.  Musculoskeletal: Positive for back pain. Negative for gait problem.  Neurological: Negative for weakness, numbness and headaches.  All other systems reviewed and are negative.     Allergies  Penicillins and Sulfa antibiotics  Home Medications   Prior to Admission medications   Not on File   BP 136/54 mmHg  Pulse 89  Temp(Src) 100.7 F (38.2 C) (Oral)  Resp 18  SpO2 96% Physical Exam  Constitutional: She is oriented to person, place, and time. She appears well-developed and well-nourished.  HENT:  Head: Normocephalic.  Mouth/Throat: Oropharynx is clear and moist.  Moist mucous membranes No exudate  Eyes: EOM are normal. Pupils are equal, round, and reactive to light.  Neck: Normal range of motion. Neck supple.  Trachea midline No bruit  Cardiovascular: Normal rate and regular rhythm.   Pulmonary/Chest: Effort normal and breath sounds normal. No stridor. No respiratory distress. She has no wheezes. She has no rales.  CTAB  Abdominal: Soft. She exhibits no mass. There is no tenderness. There is no rebound and no guarding.  Stool noted in the transverse and descending colon  Musculoskeletal: Normal range of motion.  Redness and warmth noted at the lateral right forearm  Lymphadenopathy:    She has no cervical  adenopathy.  Neurological: She is alert and oriented to person, place, and time. She has normal reflexes.  L5S1 intact  Intact perineal sensation  Skin: Skin is warm and dry.  Psychiatric: She has a normal mood and affect. Her behavior is normal.  Nursing note and vitals reviewed.   ED Course  Procedures (including critical care time) DIAGNOSTIC  STUDIES: Oxygen Saturation is 96% on RA,  normal by my interpretation.    COORDINATION OF CARE: 3:43 AM Discussed treatment plan which includes lab work with pt at bedside and pt agreed to plan.  Labs Review Labs Reviewed  COMPREHENSIVE METABOLIC PANEL - Abnormal; Notable for the following:    Glucose, Bld 151 (*)    Calcium 8.7 (*)    Total Protein 6.4 (*)    Albumin 3.3 (*)    Total Bilirubin 0.1 (*)    All other components within normal limits  CBC - Abnormal; Notable for the following:    Hemoglobin 10.8 (*)    HCT 33.6 (*)    All other components within normal limits  URINALYSIS, ROUTINE W REFLEX MICROSCOPIC (NOT AT [facility]) - Abnormal; Notable for the following:    Hgb urine dipstick MODERATE (*)    All other components within normal limits  URINE MICROSCOPIC-ADD ON - Abnormal; Notable for the following:    Squamous Epithelial / LPF 0-5 (*)    Bacteria, UA RARE (*)    Casts HYALINE CASTS (*)    All other components within normal limits  LIPASE, BLOOD  URINALYSIS, ROUTINE W REFLEX MICROSCOPIC (NOT AT [facility])    Imaging Review No results found. I have personally reviewed and evaluated these lab results as part of my medical decision-making.   EKG Interpretation None      MDM   Final diagnoses:  None   Medications  fosfomycin (MONUROL) packet 3 g (not administered)  methocarbamol (ROBAXIN) tablet 1,000 mg (not administered)  ketorolac (TORADOL) 30 MG/ML injection 30 mg (30 mg Intravenous Given 12/08/15 0400)  gadobenate dimeglumine (MULTIHANCE) injection 10 mL (10 mLs Intravenous Contrast Given 12/08/15 0535)   Results for orders placed or performed during the hospital encounter of 12/08/15  Lipase, blood  Result Value Ref Range   Lipase 41 11 - 51 U/L  Comprehensive metabolic panel  Result Value Ref Range   Sodium 140 135 - 145 mmol/L   Potassium 3.8 3.5 - 5.1 mmol/L   Chloride 105 101 - 111 mmol/L   CO2 24 22 - 32 mmol/L   Glucose, Bld 151 (H) 65 - 99 mg/dL    BUN 19 6 - 20 mg/dL   Creatinine, Ser 0.72 0.44 - 1.00 mg/dL   Calcium 8.7 (L) 8.9 - 10.3 mg/dL   Total Protein 6.4 (L) 6.5 - 8.1 g/dL   Albumin 3.3 (L) 3.5 - 5.0 g/dL   AST 22 15 - 41 U/L   ALT 14 14 - 54 U/L   Alkaline Phosphatase 49 38 - 126 U/L   Total Bilirubin 0.1 (L) 0.3 - 1.2 mg/dL   GFR calc non Af Amer >60 >60 mL/min   GFR calc Af Amer >60 >60 mL/min   Anion gap 11 5 - 15  CBC  Result Value Ref Range   WBC 8.8 4.0 - 10.5 K/uL   RBC 3.92 3.87 - 5.11 MIL/uL   Hemoglobin 10.8 (L) 12.0 - 15.0 g/dL   HCT 33.6 (L) 36.0 - 46.0 %   MCV 85.7 78.0 - 100.0 fL   MCH 27.6 26.0 -  34.0 pg   MCHC 32.1 30.0 - 36.0 g/dL   RDW 13.4 11.5 - 15.5 %   Platelets 252 150 - 400 K/uL  Urinalysis, Routine w reflex microscopic (not at [facility])  Result Value Ref Range   Color, Urine YELLOW YELLOW   APPearance CLEAR CLEAR   Specific Gravity, Urine 1.011 1.005 - 1.030   pH 5.5 5.0 - 8.0   Glucose, UA NEGATIVE NEGATIVE mg/dL   Hgb urine dipstick MODERATE (A) NEGATIVE   Bilirubin Urine NEGATIVE NEGATIVE   Ketones, ur NEGATIVE NEGATIVE mg/dL   Protein, ur NEGATIVE NEGATIVE mg/dL   Nitrite NEGATIVE NEGATIVE   Leukocytes, UA NEGATIVE NEGATIVE  Urine microscopic-add on  Result Value Ref Range   Squamous Epithelial / LPF 0-5 (A) NONE SEEN   WBC, UA 0-5 0 - 5 WBC/hpf   RBC / HPF 0-5 0 - 5 RBC/hpf   Bacteria, UA RARE (A) NONE SEEN   Casts HYALINE CASTS (A) NEGATIVE   Mr Lumbar Spine W Wo Contrast  12/08/2015  CLINICAL DATA:  Constant severe LEFT flank pain for 3 days.  Fever. EXAM: MRI LUMBAR SPINE WITHOUT AND WITH CONTRAST TECHNIQUE: Multiplanar and multiecho pulse sequences of the lumbar spine were obtained without and with intravenous contrast. CONTRAST:  67mL MULTIHANCE GADOBENATE DIMEGLUMINE 529 MG/ML IV SOLN COMPARISON:  CT abdomen pelvis December 08, 2015 at 0408 hours FINDINGS: Lumbar vertebral bodies intact and aligned with maintenance of lumbar lordosis. Transitional anatomy, lumbarized S1  vertebral body. Broad levoscoliosis better seen on today's CT. Moderate to severe L2-3 through L5-S1 disc height loss associated with scoliosis with decreased T2 signal within all discs compatible with desiccation. Moderate acute on chronic discogenic endplate changes [id number], mild at L3-4 and L4-5. No suspicious osseous or intradiscal enhancement. Conus medullaris terminates at L2 and is normal morphology and signal characteristics. Cauda equina is unremarkable. No abnormal spinal cord, leptomeningeal or epidural enhancement. Included prevertebral and paraspinal soft tissues are nonsuspicious, moderate paraspinal muscle atrophy. Mild bright interstitial STIR signal LEFT paraspinal soft tissues. Level by level evaluation: L1-2: Small 3 mm broad-based disc bulge. No canal stenosis or neural foraminal narrowing. L2-3: Small broad-based disc bulge asymmetric to the LEFT. Mild facet arthropathy and ligamentum flavum redundancy. Trace LEFT facet effusion is likely reactive. No canal stenosis. Encroachment upon the exited LEFT L2 nerve. No canal stenosis. Mild LEFT neural foraminal narrowing. L3-4: Small broad-based disc bulge, moderate LEFT facet arthropathy and ligamentum flavum redundancy with trace LEFT facet effusion which is likely reactive. No canal stenosis. Mild LEFT greater than RIGHT neural foraminal narrowing. L4-5: Small broad-based disc bulge asymmetric to LEFT. Moderate facet arthropathy and ligamentum flavum redundancy with trace LEFT facet effusion which is likely reactive. No canal stenosis. Mild RIGHT greater LEFT neural foraminal narrowing. L5-S1: Small broad-based disc bulge. Severe RIGHT, mild LEFT facet arthropathy and ligamentum flavum redundancy without canal stenosis. Moderate to severe RIGHT neural foraminal narrowing, mild on the LEFT. IMPRESSION: No acute lumbar spine fracture or malalignment. Levoscoliosis better seen on today's CT. Degenerative lumbar spine without canal stenosis. Neural  foraminal narrowing L2-3 through L5-S1: Moderate to severe on the RIGHT at L5-S1. Low-grade LEFT paraspinal muscle strain. Electronically Signed   By: Elon Alas M.D.   On: 12/08/2015 05:55   Ct Renal Stone Study  12/08/2015  CLINICAL DATA:  New constant left flank pain, onset 3 days ago. EXAM: CT ABDOMEN AND PELVIS WITHOUT CONTRAST TECHNIQUE: Multidetector CT imaging of the abdomen and pelvis was performed following the standard protocol  without IV contrast. COMPARISON:  None. FINDINGS: Atelectasis in the lung bases.  Coronary artery calcifications. Kidneys are symmetrical in size and shape. No hydronephrosis or hydroureter. Punctate size stone in the lower pole right kidney. No stones in the left kidney. No ureteral stones or bladder stones. Bladder wall is not thickened. The unenhanced appearance of the liver, spleen, gallbladder, pancreas, adrenal glands, inferior vena cava, abdominal aorta, and retroperitoneal lymph nodes is unremarkable. Stomach, small bowel, and colon are not abnormally distended. The colon is diffusely stool-filled suggesting constipation. No free air or free fluid in the abdomen. Abdominal wall musculature appears intact. Pelvis: The appendix is not identified. Uterus and ovaries are not enlarged. No pelvic mass or lymphadenopathy. No free or loculated pelvic fluid collections. No evidence of diverticulitis. Degenerative changes throughout the spine. No destructive bone lesions. IMPRESSION: Nonobstructing intrarenal stone on the right. No obstructing ureteral stones identified. Electronically Signed   By: Lucienne Capers M.D.   On: 12/08/2015 04:42       Patient updated frequently on labs and imaging.     Case d/w Dr. Silverio Decamp who is seeing the patient   Patient instructed to follow up with both their PMD and Dr. Gladstone Lighter.  Patient verbalizes understanding and agrees to follow up.  Strict fever return precautions.    I personally performed the services described in  this documentation, which was scribed in my presence. The recorded information has been reviewed and is accurate.      Veatrice Kells, MD 12/08/15 [phone]

## 2015-12-09 LAB — URINE CULTURE: Special Requests: NORMAL

## 2015-12-23 ENCOUNTER — Other Ambulatory Visit: Payer: Self-pay

## 2015-12-23 DIAGNOSIS — Z1231 Encounter for screening mammogram for malignant neoplasm of breast: Secondary | ICD-10-CM

## 2016-01-01 ENCOUNTER — Ambulatory Visit
Admission: RE | Admit: 2016-01-01 | Discharge: 2016-01-01 | Disposition: A | Discharge status: Home / Self Care | Payer: Managed Care, Other (non HMO) | Source: Ambulatory Visit

## 2016-01-01 DIAGNOSIS — Z1231 Encounter for screening mammogram for malignant neoplasm of breast: Secondary | ICD-10-CM

## 2016-12-07 DIAGNOSIS — M810 Age-related osteoporosis without current pathological fracture: Secondary | ICD-10-CM | POA: Diagnosis not present

## 2017-02-17 DIAGNOSIS — M810 Age-related osteoporosis without current pathological fracture: Secondary | ICD-10-CM | POA: Diagnosis not present

## 2017-02-17 DIAGNOSIS — Z0000 Encounter for general adult medical examination without abnormal findings: Secondary | ICD-10-CM | POA: Diagnosis not present

## 2017-02-17 DIAGNOSIS — R413 Other amnesia: Secondary | ICD-10-CM | POA: Diagnosis not present

## 2017-02-17 DIAGNOSIS — N951 Menopausal and female climacteric states: Secondary | ICD-10-CM | POA: Diagnosis not present

## 2017-02-17 DIAGNOSIS — E7800 Pure hypercholesterolemia, unspecified: Secondary | ICD-10-CM | POA: Diagnosis not present

## 2017-02-17 DIAGNOSIS — E559 Vitamin D deficiency, unspecified: Secondary | ICD-10-CM | POA: Diagnosis not present

## 2017-02-17 DIAGNOSIS — I10 Essential (primary) hypertension: Secondary | ICD-10-CM | POA: Diagnosis not present

## 2017-02-17 DIAGNOSIS — M1990 Unspecified osteoarthritis, unspecified site: Secondary | ICD-10-CM | POA: Diagnosis not present

## 2017-06-07 DIAGNOSIS — M810 Age-related osteoporosis without current pathological fracture: Secondary | ICD-10-CM | POA: Diagnosis not present

## 2017-07-25 DIAGNOSIS — E559 Vitamin D deficiency, unspecified: Secondary | ICD-10-CM | POA: Diagnosis not present

## 2017-07-25 DIAGNOSIS — R413 Other amnesia: Secondary | ICD-10-CM | POA: Diagnosis not present

## 2017-07-25 DIAGNOSIS — I10 Essential (primary) hypertension: Secondary | ICD-10-CM | POA: Diagnosis not present

## 2017-07-25 DIAGNOSIS — F411 Generalized anxiety disorder: Secondary | ICD-10-CM | POA: Diagnosis not present

## 2017-07-25 DIAGNOSIS — Z23 Encounter for immunization: Secondary | ICD-10-CM | POA: Diagnosis not present

## 2017-09-20 ENCOUNTER — Ambulatory Visit: Payer: Medicare Other | Admitting: Neurology

## 2017-10-27 ENCOUNTER — Other Ambulatory Visit: Payer: Self-pay | Admitting: Dermatology

## 2017-10-27 DIAGNOSIS — D485 Neoplasm of uncertain behavior of skin: Secondary | ICD-10-CM | POA: Diagnosis not present

## 2017-10-27 DIAGNOSIS — L309 Dermatitis, unspecified: Secondary | ICD-10-CM | POA: Diagnosis not present

## 2017-10-27 DIAGNOSIS — L259 Unspecified contact dermatitis, unspecified cause: Secondary | ICD-10-CM | POA: Diagnosis not present

## 2017-11-15 DIAGNOSIS — H25013 Cortical age-related cataract, bilateral: Secondary | ICD-10-CM | POA: Diagnosis not present

## 2017-11-15 DIAGNOSIS — H2513 Age-related nuclear cataract, bilateral: Secondary | ICD-10-CM | POA: Diagnosis not present

## 2017-11-23 DIAGNOSIS — H25012 Cortical age-related cataract, left eye: Secondary | ICD-10-CM | POA: Diagnosis not present

## 2017-11-23 DIAGNOSIS — H2512 Age-related nuclear cataract, left eye: Secondary | ICD-10-CM | POA: Diagnosis not present

## 2017-11-23 HISTORY — PX: CATARACT EXTRACTION: SUR2

## 2017-12-04 ENCOUNTER — Encounter (HOSPITAL_COMMUNITY): Payer: Self-pay

## 2017-12-04 ENCOUNTER — Emergency Department (HOSPITAL_COMMUNITY): Payer: Medicare Other

## 2017-12-04 ENCOUNTER — Inpatient Hospital Stay (HOSPITAL_COMMUNITY)
Admission: EM | Admit: 2017-12-04 | Discharge: 2017-12-07 | DRG: 123 | Disposition: A | Discharge status: Rehabilitation Facility | Payer: Medicare Other | Attending: Internal Medicine | Admitting: Internal Medicine

## 2017-12-04 ENCOUNTER — Other Ambulatory Visit: Payer: Self-pay

## 2017-12-04 DIAGNOSIS — I639 Cerebral infarction, unspecified: Secondary | ICD-10-CM | POA: Diagnosis not present

## 2017-12-04 DIAGNOSIS — G459 Transient cerebral ischemic attack, unspecified: Secondary | ICD-10-CM | POA: Diagnosis not present

## 2017-12-04 DIAGNOSIS — E785 Hyperlipidemia, unspecified: Secondary | ICD-10-CM | POA: Diagnosis present

## 2017-12-04 DIAGNOSIS — W19XXXA Unspecified fall, initial encounter: Secondary | ICD-10-CM

## 2017-12-04 DIAGNOSIS — H510 Palsy (spasm) of conjugate gaze: Secondary | ICD-10-CM | POA: Diagnosis present

## 2017-12-04 DIAGNOSIS — I10 Essential (primary) hypertension: Secondary | ICD-10-CM | POA: Diagnosis not present

## 2017-12-04 DIAGNOSIS — R9401 Abnormal electroencephalogram [EEG]: Secondary | ICD-10-CM | POA: Diagnosis present

## 2017-12-04 DIAGNOSIS — R509 Fever, unspecified: Secondary | ICD-10-CM | POA: Diagnosis not present

## 2017-12-04 DIAGNOSIS — H47293 Other optic atrophy, bilateral: Principal | ICD-10-CM | POA: Diagnosis present

## 2017-12-04 DIAGNOSIS — R483 Visual agnosia: Secondary | ICD-10-CM | POA: Diagnosis present

## 2017-12-04 DIAGNOSIS — H538 Other visual disturbances: Secondary | ICD-10-CM | POA: Diagnosis not present

## 2017-12-04 DIAGNOSIS — H5316 Psychophysical visual disturbances: Secondary | ICD-10-CM | POA: Diagnosis not present

## 2017-12-04 DIAGNOSIS — R51 Headache: Secondary | ICD-10-CM | POA: Diagnosis not present

## 2017-12-04 DIAGNOSIS — D62 Acute posthemorrhagic anemia: Secondary | ICD-10-CM | POA: Diagnosis not present

## 2017-12-04 DIAGNOSIS — H518 Other specified disorders of binocular movement: Secondary | ICD-10-CM | POA: Diagnosis present

## 2017-12-04 DIAGNOSIS — R52 Pain, unspecified: Secondary | ICD-10-CM

## 2017-12-04 DIAGNOSIS — M545 Low back pain: Secondary | ICD-10-CM | POA: Diagnosis not present

## 2017-12-04 DIAGNOSIS — R270 Ataxia, unspecified: Secondary | ICD-10-CM

## 2017-12-04 DIAGNOSIS — I6523 Occlusion and stenosis of bilateral carotid arteries: Secondary | ICD-10-CM | POA: Diagnosis not present

## 2017-12-04 DIAGNOSIS — Z79899 Other long term (current) drug therapy: Secondary | ICD-10-CM

## 2017-12-04 DIAGNOSIS — Z96659 Presence of unspecified artificial knee joint: Secondary | ICD-10-CM | POA: Diagnosis present

## 2017-12-04 DIAGNOSIS — E876 Hypokalemia: Secondary | ICD-10-CM | POA: Diagnosis present

## 2017-12-04 DIAGNOSIS — R488 Other symbolic dysfunctions: Secondary | ICD-10-CM | POA: Diagnosis present

## 2017-12-04 DIAGNOSIS — R9389 Abnormal findings on diagnostic imaging of other specified body structures: Secondary | ICD-10-CM

## 2017-12-04 DIAGNOSIS — E7800 Pure hypercholesterolemia, unspecified: Secondary | ICD-10-CM | POA: Diagnosis present

## 2017-12-04 DIAGNOSIS — M419 Scoliosis, unspecified: Secondary | ICD-10-CM | POA: Diagnosis present

## 2017-12-04 DIAGNOSIS — F0390 Unspecified dementia without behavioral disturbance: Secondary | ICD-10-CM | POA: Diagnosis present

## 2017-12-04 DIAGNOSIS — Z8742 Personal history of other diseases of the female genital tract: Secondary | ICD-10-CM

## 2017-12-04 DIAGNOSIS — R4189 Other symptoms and signs involving cognitive functions and awareness: Secondary | ICD-10-CM | POA: Diagnosis present

## 2017-12-04 DIAGNOSIS — S0990XA Unspecified injury of head, initial encounter: Secondary | ICD-10-CM | POA: Diagnosis not present

## 2017-12-04 HISTORY — DX: Unspecified osteoarthritis, unspecified site: M19.90

## 2017-12-04 LAB — I-STAT TROPONIN, ED: Troponin i, poc: 0 ng/mL (ref 0.00–0.08)

## 2017-12-04 LAB — DIFFERENTIAL
Basophils Absolute: 0.1 10*3/uL (ref 0.0–0.1)
Basophils Relative: 1 %
EOS PCT: 2 %
Eosinophils Absolute: 0.2 10*3/uL (ref 0.0–0.7)
LYMPHS ABS: 1.6 10*3/uL (ref 0.7–4.0)
LYMPHS PCT: 18 %
MONO ABS: 0.7 10*3/uL (ref 0.1–1.0)
Monocytes Relative: 8 %
NEUTROS ABS: 6.2 10*3/uL (ref 1.7–7.7)
NEUTROS PCT: 71 %

## 2017-12-04 LAB — COMPREHENSIVE METABOLIC PANEL
ALBUMIN: 3.6 g/dL (ref 3.5–5.0)
ALK PHOS: 47 U/L (ref 38–126)
ALT: 15 U/L (ref 14–54)
ANION GAP: 10 (ref 5–15)
AST: 22 U/L (ref 15–41)
BILIRUBIN TOTAL: 0.5 mg/dL (ref 0.3–1.2)
BUN: 10 mg/dL (ref 6–20)
CALCIUM: 9.1 mg/dL (ref 8.9–10.3)
CO2: 23 mmol/L (ref 22–32)
CREATININE: 0.69 mg/dL (ref 0.44–1.00)
Chloride: 106 mmol/L (ref 101–111)
GFR calc Af Amer: >60 mL/min (ref >60)
GFR calc non Af Amer: >60 mL/min (ref >60)
GLUCOSE: 105 mg/dL — AB (ref 65–99)
Potassium: 3.3 mmol/L — ABNORMAL LOW (ref 3.5–5.1)
SODIUM: 139 mmol/L (ref 135–145)
TOTAL PROTEIN: 6.6 g/dL (ref 6.5–8.1)

## 2017-12-04 LAB — CBG MONITORING, ED: Glucose-Capillary: 103 mg/dL — ABNORMAL HIGH (ref 65–99)

## 2017-12-04 LAB — RAPID URINE DRUG SCREEN, HOSP PERFORMED
AMPHETAMINES: NOT DETECTED
BARBITURATES: NOT DETECTED
BENZODIAZEPINES: NOT DETECTED
COCAINE: NOT DETECTED
Opiates: NOT DETECTED
Tetrahydrocannabinol: NOT DETECTED

## 2017-12-04 LAB — URINALYSIS, ROUTINE W REFLEX MICROSCOPIC
BACTERIA UA: NONE SEEN
Bilirubin Urine: NEGATIVE
Glucose, UA: NEGATIVE mg/dL
Ketones, ur: 5 mg/dL — AB
LEUKOCYTES UA: NEGATIVE
Nitrite: NEGATIVE
PROTEIN: NEGATIVE mg/dL
SQUAMOUS EPITHELIAL / LPF: NONE SEEN
Specific Gravity, Urine: 1.013 (ref 1.005–1.030)
pH: 7 (ref 5.0–8.0)

## 2017-12-04 LAB — I-STAT CHEM 8, ED
BUN: 12 mg/dL (ref 6–20)
CHLORIDE: 105 mmol/L (ref 101–111)
CREATININE: 0.6 mg/dL (ref 0.44–1.00)
Calcium, Ion: 1.1 mmol/L — ABNORMAL LOW (ref 1.15–1.40)
Glucose, Bld: 104 mg/dL — ABNORMAL HIGH (ref 65–99)
HCT: 35 % — ABNORMAL LOW (ref 36.0–46.0)
Hemoglobin: 11.9 g/dL — ABNORMAL LOW (ref 12.0–15.0)
POTASSIUM: 3.4 mmol/L — AB (ref 3.5–5.1)
SODIUM: 140 mmol/L (ref 135–145)
TCO2: 25 mmol/L (ref 22–32)

## 2017-12-04 LAB — CBC
HCT: 35.3 % — ABNORMAL LOW (ref 36.0–46.0)
HEMOGLOBIN: 11.6 g/dL — AB (ref 12.0–15.0)
MCH: 28.2 pg (ref 26.0–34.0)
MCHC: 32.9 g/dL (ref 30.0–36.0)
MCV: 85.7 fL (ref 78.0–100.0)
PLATELETS: 266 10*3/uL (ref 150–400)
RBC: 4.12 MIL/uL (ref 3.87–5.11)
RDW: 13.7 % (ref 11.5–15.5)
WBC: 8.7 10*3/uL (ref 4.0–10.5)

## 2017-12-04 LAB — PROTIME-INR
INR: 1.13
PROTHROMBIN TIME: 14.4 s (ref 11.4–15.2)

## 2017-12-04 LAB — ETHANOL: Alcohol, Ethyl (B): <10 mg/dL (ref <10)

## 2017-12-04 MED ORDER — ACETAMINOPHEN 650 MG RE SUPP: 650.0000 mg | RECTAL | Status: DC | PRN

## 2017-12-04 MED ORDER — ASPIRIN 325 MG PO TABS
325.0000 mg | ORAL_TABLET | Freq: Every day | ORAL | Status: DC
  Administered 2017-12-05: 325 mg via ORAL
  Filled 2017-12-04: qty 1

## 2017-12-04 MED ORDER — OFLOXACIN 0.3 % OP SOLN
1.0000 [drp] | Freq: Two times a day (BID) | OPHTHALMIC | Status: DC
  Administered 2017-12-06 – 2017-12-07 (×3): 1 [drp] via OPHTHALMIC
  Filled 2017-12-04: qty 5

## 2017-12-04 MED ORDER — ASPIRIN 300 MG RE SUPP: 300.0000 mg | Freq: Every day | RECTAL | Status: DC

## 2017-12-04 MED ORDER — STROKE: EARLY STAGES OF RECOVERY BOOK
Freq: Once | Status: AC
  Administered 2017-12-05: 02:00:00
  Filled 2017-12-04: qty 1

## 2017-12-04 MED ORDER — PREDNISOLONE ACETATE 1 % OP SUSP
1.0000 [drp] | Freq: Two times a day (BID) | OPHTHALMIC | Status: DC
  Administered 2017-12-06 – 2017-12-07 (×3): 1 [drp] via OPHTHALMIC
  Filled 2017-12-04: qty 1

## 2017-12-04 MED ORDER — IOPAMIDOL (ISOVUE-370) INJECTION 76%
INTRAVENOUS | Status: AC
  Administered 2017-12-04: 100 mL
  Filled 2017-12-04: qty 100

## 2017-12-04 MED ORDER — SODIUM CHLORIDE 0.9 % IV SOLN
INTRAVENOUS | Status: AC
  Administered 2017-12-04: via INTRAVENOUS

## 2017-12-04 MED ORDER — ACETAMINOPHEN 160 MG/5ML PO SOLN: 650.0000 mg | ORAL | Status: DC | PRN

## 2017-12-04 MED ORDER — ENOXAPARIN SODIUM 40 MG/0.4ML ~~LOC~~ SOLN
40.0000 mg | SUBCUTANEOUS | Status: DC
  Administered 2017-12-05 – 2017-12-07 (×3): 40 mg via SUBCUTANEOUS
  Filled 2017-12-04 (×3): qty 0.4

## 2017-12-04 MED ORDER — ACETAMINOPHEN 325 MG PO TABS
650.0000 mg | ORAL_TABLET | ORAL | Status: DC | PRN
  Administered 2017-12-06 – 2017-12-07 (×2): 650 mg via ORAL
  Filled 2017-12-04 (×3): qty 2

## 2017-12-04 NOTE — H&P (Signed)
History and Physical    Tammy Arnold ZOX:[id number] DOB: August 18, 1941 DOA: 12/04/2017  PCP: Kelton Pillar, MD  Patient coming from: Home.  History provided by patient's husband.  Chief Complaint: Fall and difficulty walking.  HPI: Tammy Arnold is a 77 y.o. female with history of hypertension hyperlipidemia who was taken off her medications recently after patient had an allergic reaction last month patient performed purpuric spots on the skin which was confirmed by biopsy was brought to the ER after patient had a fall at home.  As per the patient's husband patient was last seen around 11:45 AM and on returning back home around 4 PM patient was found to be on the floor.  Patient was conscious.  Patient has poor memory and does not remember why she fell.  After which patient was helped and made to sit on the chair.  During which it was noted that patient had ataxic gait.  Patient not had any difficulty swallowing or speaking.  When patient's husband went to the room and come back she had a fall onto the floor.  Did not lose consciousness or hit her head.  Patient was brought to the ER.  ED Course: In the ER patient had CT head followed by CT angiogram of the head and neck which all were unremarkable.  Patient is being admitted for further management of possible TIA versus stroke.  On exam patient is able to move all extremities without difficulty.  Pupils are equal and reactive to light.  Patient passed swallow.  Review of Systems: As per HPI, rest all negative.   Past Medical History:  Diagnosis Date  . Arthritis   . High cholesterol   . Hypertension     Past Surgical History:  Procedure Laterality Date  . CATARACT EXTRACTION    . REPLACEMENT TOTAL KNEE       reports that  has never smoked. she has never used smokeless tobacco. She reports that she does not drink alcohol. Her drug history is not on file.  Allergies  Allergen Reactions  . Baclofen Other (See Comments)   Caused brain dysfunction  . Keflex [Cephalexin] Rash  . Macrodantin [Nitrofurantoin] Rash  . Neggram [Nalidixic Acid] Rash  . Penicillins Rash    Has patient had a PCN reaction causing immediate rash, facial/tongue/throat swelling, SOB or lightheadedness with hypotension: Yes Has patient had a PCN reaction causing severe rash involving mucus membranes or skin necrosis: No Has patient had a PCN reaction that required hospitalization: No Has patient had a PCN reaction occurring within the last 10 years: No If all of the above answers are "NO", then may proceed with Cephalosporin use.  . Sulfa Antibiotics Rash    Family History  Problem Relation Age of Onset  . Dementia Mother     Prior to Admission medications   Medication Sig Start Date End Date Taking? Authorizing Provider  Difluprednate (DUREZOL) 0.05 % EMUL Place 1 drop into the left eye See admin instructions. Instill one drop into left eye twice daily for 3 weeks starting after surgery (surgery date 11/23/17)   Yes [provider]  ofloxacin (OCUFLOX) 0.3 % ophthalmic solution Place 1 drop into the left eye See admin instructions. Instill one drop into left eye twice daily starting 3 days prior to surgery and continuing for 3 weeks after surgery (surgery date 11/23/17) 11/17/17  Yes [provider]  baclofen (LIORESAL) 10 MG tablet Take 5 mg of baclofen in the morning and 10 mg  at night as needed for muscle spasms. Patient not taking: Reported on 12/04/2017 12/08/15   Elnora Morrison, MD  Diclofenac Sodium CR (VOLTAREN-XR) 100 MG 24 hr tablet Take 1 tablet (100 mg total) by mouth daily. Patient not taking: Reported on 12/04/2017 12/08/15   Elnora Morrison, MD  polyethylene glycol powder (GLYCOLAX/MIRALAX) powder Take 17 g by mouth daily. Patient not taking: Reported on 12/04/2017 12/08/15   Veatrice Kells, MD    Physical Exam: Vitals:   12/04/17 2030 12/04/17 2200 12/04/17 2215 12/04/17 2230  BP: (!) 159/71 (!) 138/94  127/60 140/66  Pulse: 73 81 77 81  Resp: 12 (!) 24 (!) 25 (!) 26  Temp:      TempSrc:      SpO2: 99% 99% 99% 100%  Weight:      Height:          Constitutional: Moderately built and nourished. Vitals:   12/04/17 2030 12/04/17 2200 12/04/17 2215 12/04/17 2230  BP: (!) 159/71 (!) 138/94 127/60 140/66  Pulse: 73 81 77 81  Resp: 12 (!) 24 (!) 25 (!) 26  Temp:      TempSrc:      SpO2: 99% 99% 99% 100%  Weight:      Height:       Eyes: Anicteric no pallor. ENMT: No discharge from the ears eyes nose or mouth. Neck: No mass felt.  No neck rigidity.  No JVD appreciated. Respiratory: No rhonchi or crepitations. Cardiovascular: S1-S2 heard no murmurs appreciated. Abdomen: Soft nontender bowel sounds present.  No guarding or rigidity. Musculoskeletal: No edema.  No joint effusion. Skin: No rash.  Skin appears warm. Neurologic: Alert awake oriented to person only.  Moves all extremities 5 x 5.  No facial asymmetry tongue is midline.  Pupils equal and reactive to light. Psychiatric: Oriented to person only.   Labs on Admission: I have personally reviewed following labs and imaging studies  CBC: Recent Labs  Lab 12/04/17 1915 12/04/17 1935  WBC 8.7  --   NEUTROABS 6.2  --   HGB 11.6* 11.9*  HCT 35.3* 35.0*  MCV 85.7  --   PLT 266  --    Basic Metabolic Panel: Recent Labs  Lab 12/04/17 1915 12/04/17 1935  NA 139 140  K 3.3* 3.4*  CL 106 105  CO2 23  --   GLUCOSE 105* 104*  BUN 10 12  CREATININE 0.69 0.60  CALCIUM 9.1  --    GFR: Estimated Creatinine Clearance: 52.5 mL/min (by C-G formula based on SCr of 0.6 mg/dL). Liver Function Tests: Recent Labs  Lab 12/04/17 1915  AST 22  ALT 15  ALKPHOS 47  BILITOT 0.5  PROT 6.6  ALBUMIN 3.6   No results for input(s): LIPASE, AMYLASE in the last 168 hours. No results for input(s): AMMONIA in the last 168 hours. Coagulation Profile: Recent Labs  Lab 12/04/17 2020  INR 1.13   Cardiac Enzymes: No results for  input(s): CKTOTAL, CKMB, CKMBINDEX, TROPONINI in the last 168 hours. BNP (last 3 results) No results for input(s): PROBNP in the last 8760 hours. HbA1C: No results for input(s): HGBA1C in the last 72 hours. CBG: Recent Labs  Lab 12/04/17 1900  GLUCAP 103*   Lipid Profile: No results for input(s): CHOL, HDL, LDLCALC, TRIG, CHOLHDL, LDLDIRECT in the last 72 hours. Thyroid Function Tests: No results for input(s): TSH, T4TOTAL, FREET4, T3FREE, THYROIDAB in the last 72 hours. Anemia Panel: No results for input(s): VITAMINB12, FOLATE, FERRITIN, TIBC, IRON, RETICCTPCT in  the last 72 hours. Urine analysis:    Component Value Date/Time   COLORURINE YELLOW 12/04/2017 1919   APPEARANCEUR CLEAR 12/04/2017 1919   LABSPEC 1.013 12/04/2017 1919   PHURINE 7.0 12/04/2017 1919   GLUCOSEU NEGATIVE 12/04/2017 1919   HGBUR MODERATE (A) 12/04/2017 1919   BILIRUBINUR NEGATIVE 12/04/2017 1919   KETONESUR 5 (A) 12/04/2017 1919   PROTEINUR NEGATIVE 12/04/2017 1919   UROBILINOGEN 0.2 08/25/2007 0835   NITRITE NEGATIVE 12/04/2017 1919   LEUKOCYTESUR NEGATIVE 12/04/2017 1919   Sepsis Labs: @LABRCNTIP (procalcitonin:4,lacticidven:4) )No results found for this or any previous visit (from the past 240 hour(s)).   Radiological Exams on Admission: Ct Angio Head W Or Wo Contrast  Result Date: 12/04/2017 CLINICAL DATA:  Focal neuro deficit for greater than 6 hours. EXAM: CT ANGIOGRAPHY HEAD AND NECK CT PERFUSION BRAIN TECHNIQUE: Multidetector CT imaging of the head and neck was performed using the standard protocol during bolus administration of intravenous contrast. Multiplanar CT image reconstructions and MIPs were obtained to evaluate the vascular anatomy. Carotid stenosis measurements (when applicable) are obtained utilizing NASCET criteria, using the distal internal carotid diameter as the denominator. Multiphase CT imaging of the brain was performed following IV bolus contrast injection. Subsequent  parametric perfusion maps were calculated using RAPID software. CONTRAST:  118mL ISOVUE-370 IOPAMIDOL (ISOVUE-370) INJECTION 76% COMPARISON:  CT head without contrast from the same day. FINDINGS: CTA NECK FINDINGS Aortic arch: A 3 vessel arch configuration is present. There is minimal calcification at the origin of the left subclavian artery. Additional calcifications are present in the distal arch without aneurysm or stenosis. Right carotid system: The right common carotid artery is within normal limits. Atherosclerotic calcifications are present at the bifurcation. There is no significant stenosis. Cervical right ICA is within normal limits. Left carotid system: Is the left common carotid artery is within normal limits. Atherosclerotic changes are present at the bifurcation. There is no significant stenosis relative to the more distal vessel. The cervical left ICA is unremarkable. Vertebral arteries: The left vertebral artery is dominant to the right. Both vertebral arteries originate from the subclavian arteries without significant stenosis. There tortuosity in the proximal left vertebral artery. No stenoses are present in either vertebral artery throughout the neck. Skeleton: Grade 1 anterolisthesis is present at C3-4. There is chronic loss of disc height at C4-5, C5-6, and C6-7. Other neck: The soft tissues the neck are otherwise unremarkable. Salivary glands are within normal limits bilaterally. No focal mucosal or submucosal lesions are present. The thyroid is within normal limits. There is no significant adenopathy. Upper chest: Interlobular septal thickening is present. There is no focal consolidation. Review of the MIP images confirms the above findings CTA HEAD FINDINGS Anterior circulation: Atherosclerotic irregularity is present in the cavernous and precavernous internal carotid arteries bilaterally. There is no significant stenosis through the ICA termini. The right A1 segment is hypoplastic. Anterior  communicating artery is patent. Both A2 segments fill. M1 segments are within normal limits. MCA bifurcations are normal. Is small vessel attenuation is present in the MCA branch vessels bilaterally without a significant proximal stenosis or occlusion. Posterior circulation: The left vertebral artery is the dominant vessel. PICA origins are visualized and normal bilaterally. The basilar artery is within normal limits. Both posterior cerebral arteries originate from the basilar tip. The PCA branch vessels are within normal limits. Venous sinuses: Dural sinuses are patent. The right transverse sinus is dominant. Straight sinus deep cerebral veins are intact. Cortical veins are unremarkable. Anatomic variants: None. Review of  the MIP images confirms the above findings CT Brain Perfusion Findings: CBF (<30%) Volume: 50mL Perfusion (Tmax>6.0s) volume: 47mL A remote left occipital lobe infarct is noted. IMPRESSION: 1. Mild atherosclerotic changes at the origin of the left subclavian artery and at the carotid bifurcations bilaterally without significant stenosis relative to the more distal vessels. 2. Atherosclerotic irregularity within the cavernous and precavernous internal carotid arteries bilaterally without significant stenosis. 3. Distal small vessel disease in the MCA branch vessels bilaterally without significant proximal stenosis, aneurysm, or branch vessel occlusion. 4. Remote left occipital lobe infarct. 5. Perfusion imaging demonstrates no acute infarct or significant ischemia. 6. Spondylosis of the cervical spine as described. Electronically Signed   By: San Morelle M.D.   On: 12/04/2017 20:47   Ct Head Wo Contrast  Result Date: 12/04/2017 CLINICAL DATA:  Pain following fall.  Expressive aphasia. EXAM: CT HEAD WITHOUT CONTRAST TECHNIQUE: Contiguous axial images were obtained from the base of the skull through the vertex without intravenous contrast. COMPARISON:  None. FINDINGS: Brain: There is  moderate generalized ventricular enlargement. There is milder sulcal prominence diffusely. There is no appreciable intracranial mass, hemorrhage, extra-axial fluid collection, midline shift. There is a small focus of 4 decreased attenuation in the mid left cerebellum which is rather ill-defined and is concerning for a small acute infarct in the mid left cerebellar hemisphere. Elsewhere, there is patchy small vessel disease in the centra semiovale bilaterally. There is small vessel disease in the posterior limb of each internal capsule. Vascular: No hyperdense vessel. There is calcification in each carotid siphon region. Skull: The bony calvarium appears intact. Sinuses/Orbits: There is mucosal thickening in several ethmoid air cells. There is slight mucosal thickening in the lateral right maxillary antrum. Other paranasal sinuses are clear. Orbits appear symmetric bilaterally except for apparent prior cataract surgery on the left. Other: Mastoid air cells are clear. There is debris in each external auditory canal. IMPRESSION: 1. Focal area of decreased attenuation in the mid left cerebellum, concerning for recent and possibly acute infarct in this area. 2. Atrophy with ventricles somewhat larger in proportion in sulci. Question a degree of superimposed normal pressure hydrocephalus. 3. Patchy supratentorial small vessel disease. No mass or hemorrhage. 4.  There are foci of arterial vascular calcification. 5.  Mild paranasal sinus disease. Electronically Signed   By: Lowella Grip III M.D.   On: 12/04/2017 18:59   Ct Angio Neck W Or Wo Contrast  Result Date: 12/04/2017 CLINICAL DATA:  Focal neuro deficit for greater than 6 hours. EXAM: CT ANGIOGRAPHY HEAD AND NECK CT PERFUSION BRAIN TECHNIQUE: Multidetector CT imaging of the head and neck was performed using the standard protocol during bolus administration of intravenous contrast. Multiplanar CT image reconstructions and MIPs were obtained to evaluate the  vascular anatomy. Carotid stenosis measurements (when applicable) are obtained utilizing NASCET criteria, using the distal internal carotid diameter as the denominator. Multiphase CT imaging of the brain was performed following IV bolus contrast injection. Subsequent parametric perfusion maps were calculated using RAPID software. CONTRAST:  126mL ISOVUE-370 IOPAMIDOL (ISOVUE-370) INJECTION 76% COMPARISON:  CT head without contrast from the same day. FINDINGS: CTA NECK FINDINGS Aortic arch: A 3 vessel arch configuration is present. There is minimal calcification at the origin of the left subclavian artery. Additional calcifications are present in the distal arch without aneurysm or stenosis. Right carotid system: The right common carotid artery is within normal limits. Atherosclerotic calcifications are present at the bifurcation. There is no significant stenosis. Cervical right ICA is  within normal limits. Left carotid system: Is the left common carotid artery is within normal limits. Atherosclerotic changes are present at the bifurcation. There is no significant stenosis relative to the more distal vessel. The cervical left ICA is unremarkable. Vertebral arteries: The left vertebral artery is dominant to the right. Both vertebral arteries originate from the subclavian arteries without significant stenosis. There tortuosity in the proximal left vertebral artery. No stenoses are present in either vertebral artery throughout the neck. Skeleton: Grade 1 anterolisthesis is present at C3-4. There is chronic loss of disc height at C4-5, C5-6, and C6-7. Other neck: The soft tissues the neck are otherwise unremarkable. Salivary glands are within normal limits bilaterally. No focal mucosal or submucosal lesions are present. The thyroid is within normal limits. There is no significant adenopathy. Upper chest: Interlobular septal thickening is present. There is no focal consolidation. Review of the MIP images confirms the  above findings CTA HEAD FINDINGS Anterior circulation: Atherosclerotic irregularity is present in the cavernous and precavernous internal carotid arteries bilaterally. There is no significant stenosis through the ICA termini. The right A1 segment is hypoplastic. Anterior communicating artery is patent. Both A2 segments fill. M1 segments are within normal limits. MCA bifurcations are normal. Is small vessel attenuation is present in the MCA branch vessels bilaterally without a significant proximal stenosis or occlusion. Posterior circulation: The left vertebral artery is the dominant vessel. PICA origins are visualized and normal bilaterally. The basilar artery is within normal limits. Both posterior cerebral arteries originate from the basilar tip. The PCA branch vessels are within normal limits. Venous sinuses: Dural sinuses are patent. The right transverse sinus is dominant. Straight sinus deep cerebral veins are intact. Cortical veins are unremarkable. Anatomic variants: None. Review of the MIP images confirms the above findings CT Brain Perfusion Findings: CBF (<30%) Volume: 31mL Perfusion (Tmax>6.0s) volume: 56mL A remote left occipital lobe infarct is noted. IMPRESSION: 1. Mild atherosclerotic changes at the origin of the left subclavian artery and at the carotid bifurcations bilaterally without significant stenosis relative to the more distal vessels. 2. Atherosclerotic irregularity within the cavernous and precavernous internal carotid arteries bilaterally without significant stenosis. 3. Distal small vessel disease in the MCA branch vessels bilaterally without significant proximal stenosis, aneurysm, or branch vessel occlusion. 4. Remote left occipital lobe infarct. 5. Perfusion imaging demonstrates no acute infarct or significant ischemia. 6. Spondylosis of the cervical spine as described. Electronically Signed   By: San Morelle M.D.   On: 12/04/2017 20:47   Ct Cerebral Perfusion W  Contrast  Result Date: 12/04/2017 CLINICAL DATA:  Focal neuro deficit for greater than 6 hours. EXAM: CT ANGIOGRAPHY HEAD AND NECK CT PERFUSION BRAIN TECHNIQUE: Multidetector CT imaging of the head and neck was performed using the standard protocol during bolus administration of intravenous contrast. Multiplanar CT image reconstructions and MIPs were obtained to evaluate the vascular anatomy. Carotid stenosis measurements (when applicable) are obtained utilizing NASCET criteria, using the distal internal carotid diameter as the denominator. Multiphase CT imaging of the brain was performed following IV bolus contrast injection. Subsequent parametric perfusion maps were calculated using RAPID software. CONTRAST:  188mL ISOVUE-370 IOPAMIDOL (ISOVUE-370) INJECTION 76% COMPARISON:  CT head without contrast from the same day. FINDINGS: CTA NECK FINDINGS Aortic arch: A 3 vessel arch configuration is present. There is minimal calcification at the origin of the left subclavian artery. Additional calcifications are present in the distal arch without aneurysm or stenosis. Right carotid system: The right common carotid artery is within normal  limits. Atherosclerotic calcifications are present at the bifurcation. There is no significant stenosis. Cervical right ICA is within normal limits. Left carotid system: Is the left common carotid artery is within normal limits. Atherosclerotic changes are present at the bifurcation. There is no significant stenosis relative to the more distal vessel. The cervical left ICA is unremarkable. Vertebral arteries: The left vertebral artery is dominant to the right. Both vertebral arteries originate from the subclavian arteries without significant stenosis. There tortuosity in the proximal left vertebral artery. No stenoses are present in either vertebral artery throughout the neck. Skeleton: Grade 1 anterolisthesis is present at C3-4. There is chronic loss of disc height at C4-5, C5-6, and  C6-7. Other neck: The soft tissues the neck are otherwise unremarkable. Salivary glands are within normal limits bilaterally. No focal mucosal or submucosal lesions are present. The thyroid is within normal limits. There is no significant adenopathy. Upper chest: Interlobular septal thickening is present. There is no focal consolidation. Review of the MIP images confirms the above findings CTA HEAD FINDINGS Anterior circulation: Atherosclerotic irregularity is present in the cavernous and precavernous internal carotid arteries bilaterally. There is no significant stenosis through the ICA termini. The right A1 segment is hypoplastic. Anterior communicating artery is patent. Both A2 segments fill. M1 segments are within normal limits. MCA bifurcations are normal. Is small vessel attenuation is present in the MCA branch vessels bilaterally without a significant proximal stenosis or occlusion. Posterior circulation: The left vertebral artery is the dominant vessel. PICA origins are visualized and normal bilaterally. The basilar artery is within normal limits. Both posterior cerebral arteries originate from the basilar tip. The PCA branch vessels are within normal limits. Venous sinuses: Dural sinuses are patent. The right transverse sinus is dominant. Straight sinus deep cerebral veins are intact. Cortical veins are unremarkable. Anatomic variants: None. Review of the MIP images confirms the above findings CT Brain Perfusion Findings: CBF (<30%) Volume: 59mL Perfusion (Tmax>6.0s) volume: 66mL A remote left occipital lobe infarct is noted. IMPRESSION: 1. Mild atherosclerotic changes at the origin of the left subclavian artery and at the carotid bifurcations bilaterally without significant stenosis relative to the more distal vessels. 2. Atherosclerotic irregularity within the cavernous and precavernous internal carotid arteries bilaterally without significant stenosis. 3. Distal small vessel disease in the MCA branch  vessels bilaterally without significant proximal stenosis, aneurysm, or branch vessel occlusion. 4. Remote left occipital lobe infarct. 5. Perfusion imaging demonstrates no acute infarct or significant ischemia. 6. Spondylosis of the cervical spine as described. Electronically Signed   By: San Morelle M.D.   On: 12/04/2017 20:47    EKG: Independently reviewed.  Normal sinus rhythm.  Assessment/Plan Principal Problem:   TIA (transient ischemic attack) Active Problems:   Essential hypertension   HLD (hyperlipidemia)    1. TIA versus stroke -appreciate neurology consult.  MRI brain has been ordered along with 2D echo.  The patient will be on aspirin.  Physical therapy consult.  Patient has had allergic reaction to statin last month so we will start and has not been ordered. 2. Hypertension -patient had a generalized reaction to hydrochlorothiazide.  Closely follow blood pressure trends.  PRN IV hydralazine for systolic blood pressure more than 220 until stroke ruled out. 3. Hyperlipidemia -statin has not been ordered since patient had recent allergic reaction. 4. Dementia patient's husband states that patient did not want to have outpatient with -.  At this time we are checking RPR B12 folate TSH levels.   DVT prophylaxis: Lovenox.  Code Status: Full code. Family Communication: [name] husband. Disposition Plan: Home. Consults called: Neurology. Admission status: Observation.   Rise Patience MD [facility] Pager [phone].  If 7PM-7AM, please contact night-coverage [email or web address] Password [facility]  12/04/2017, 10:43 PM

## 2017-12-04 NOTE — ED Notes (Signed)
Canceled code stroke per Dr. Rory Percy

## 2017-12-04 NOTE — ED Triage Notes (Signed)
Per Pt, Pt is coming from home with complaints of fall. Husband reports that he left patient around 1145 to go out. When he got home slightly after 1600 patient was found on the ground. Husband helped patient out of the floor and patient requested to walk back and forth. When he left her in the kitchen, pt fell again trying to get up and walk. Reports that he gait is off. Noted to have expressive aphasia, but patient has been having slight episodes of this in the last two weeks. Two weeks ago, pt had cataract surgery.

## 2017-12-04 NOTE — ED Notes (Signed)
Neurologist at bedside,patient back from [place], RN present during the CT

## 2017-12-04 NOTE — ED Notes (Signed)
This RN attempted IV x 2 without success 

## 2017-12-04 NOTE — ED Provider Notes (Signed)
[facility] Provider Note   CSN: [id number] Arrival date & time: 12/04/17  1815     History   Chief Complaint Chief Complaint  Patient presents with  . Fall  . Stroke Symptoms    HPI Tammy Arnold is a 77 y.o. female.  HPI   Patient 77 year old female presenting with expressive aphasia.  Unfortunately has been is not at bedside.  According to nurses came home at 4:00 to find her lying on the floor.  Per report last known normal at 11:30 AM..  That means last known normal was almost 8 hours ago.  Past Medical History:  Diagnosis Date  . Arthritis   . High cholesterol   . Hypertension     Patient Active Problem List   Diagnosis Date Noted  . TIA (transient ischemic attack) 12/04/2017  . Essential hypertension 12/04/2017  . HLD (hyperlipidemia) 12/04/2017    Past Surgical History:  Procedure Laterality Date  . CATARACT EXTRACTION    . REPLACEMENT TOTAL KNEE      OB History    No data available       Home Medications    Prior to Admission medications   Medication Sig Start Date End Date Taking? Authorizing Provider  Difluprednate (DUREZOL) 0.05 % EMUL Place 1 drop into the left eye See admin instructions. Instill one drop into left eye twice daily for 3 weeks starting after surgery (surgery date 11/23/17)   Yes [provider]  ofloxacin (OCUFLOX) 0.3 % ophthalmic solution Place 1 drop into the left eye See admin instructions. Instill one drop into left eye twice daily starting 3 days prior to surgery and continuing for 3 weeks after surgery (surgery date 11/23/17) 11/17/17  Yes [provider]  baclofen (LIORESAL) 10 MG tablet Take 5 mg of baclofen in the morning and 10 mg at night as needed for muscle spasms. Patient not taking: Reported on 12/04/2017 12/08/15   Elnora Morrison, MD  Diclofenac Sodium CR (VOLTAREN-XR) 100 MG 24 hr tablet Take 1 tablet (100 mg total) by mouth daily. Patient not taking: Reported on 12/04/2017  12/08/15   Elnora Morrison, MD  polyethylene glycol powder (GLYCOLAX/MIRALAX) powder Take 17 g by mouth daily. Patient not taking: Reported on 12/04/2017 12/08/15   Veatrice Kells, MD    Family History Family History  Problem Relation Age of Onset  . Dementia Mother     Social History Social History   Tobacco Use  . Smoking status: Never Smoker  . Smokeless tobacco: Never Used  . Tobacco comment: smoked very little in college none since  Substance Use Topics  . Alcohol use: No    Alcohol/week: 0.0 oz  . Drug use: Not on file     Allergies   Baclofen; Keflex [cephalexin]; Macrodantin [nitrofurantoin]; Neggram [nalidixic acid]; Penicillins; and Sulfa antibiotics   Review of Systems Review of Systems  Unable to perform ROS: Mental status change     Physical Exam Updated Vital Signs BP 140/66   Pulse 81   Temp 98.6 F (37 C) (Oral)   Resp (!) 26   Ht 5\' 4"  (1.626 m)   Wt 56.7 kg (125 lb)   SpO2 100%   BMI 21.46 kg/m   Physical Exam  Constitutional: She appears well-developed and well-nourished.  HENT:  Head: Normocephalic and atraumatic.  Eyes: Right eye exhibits no discharge. Left eye exhibits no discharge.  Cardiovascular: Normal rate and regular rhythm.  Pulmonary/Chest: Effort normal and breath sounds normal. No respiratory  distress.  Neurological:  oreinted to first name, not place time or situation.   Difficulty finding words,   CN appear intact, some dysmetria  Skin: Skin is warm and dry. She is not diaphoretic.  Psychiatric: She has a normal mood and affect.  Nursing note and vitals reviewed.    ED Treatments / Results  Labs (all labs ordered are listed, but only abnormal results are displayed) Labs Reviewed  CBC - Abnormal; Notable for the following components:      Result Value   Hemoglobin 11.6 (*)    HCT 35.3 (*)    All other components within normal limits  COMPREHENSIVE METABOLIC PANEL - Abnormal; Notable for the following components:     Potassium 3.3 (*)    Glucose, Bld 105 (*)    All other components within normal limits  URINALYSIS, ROUTINE W REFLEX MICROSCOPIC - Abnormal; Notable for the following components:   Hgb urine dipstick MODERATE (*)    Ketones, ur 5 (*)    All other components within normal limits  CBG MONITORING, ED - Abnormal; Notable for the following components:   Glucose-Capillary 103 (*)    All other components within normal limits  I-STAT CHEM 8, ED - Abnormal; Notable for the following components:   Potassium 3.4 (*)    Glucose, Bld 104 (*)    Calcium, Ion 1.10 (*)    Hemoglobin 11.9 (*)    HCT 35.0 (*)    All other components within normal limits  DIFFERENTIAL  ETHANOL  RAPID URINE DRUG SCREEN, HOSP PERFORMED  PROTIME-INR  HEMOGLOBIN A1C  LIPID PANEL  CBC  CREATININE, SERUM  COMPREHENSIVE METABOLIC PANEL  CBC  I-STAT TROPONIN, ED  I-STAT TROPONIN, ED    EKG  EKG Interpretation  Date/Time:  Sunday December 04 2017 18:58:40 EST Ventricular Rate:  76 PR Interval:    QRS Duration: 91 QT Interval:  397 QTC Calculation: 447 R Axis:   90 Text Interpretation:  Sinus rhythm Borderline right axis deviation Normal sinus rhythm Confirmed by Thomasene Lot, Deleon Passe [phone]) on 12/04/2017 11:53:17 PM       Radiology Ct Angio Head W Or Wo Contrast  Result Date: 12/04/2017 CLINICAL DATA:  Focal neuro deficit for greater than 6 hours. EXAM: CT ANGIOGRAPHY HEAD AND NECK CT PERFUSION BRAIN TECHNIQUE: Multidetector CT imaging of the head and neck was performed using the standard protocol during bolus administration of intravenous contrast. Multiplanar CT image reconstructions and MIPs were obtained to evaluate the vascular anatomy. Carotid stenosis measurements (when applicable) are obtained utilizing NASCET criteria, using the distal internal carotid diameter as the denominator. Multiphase CT imaging of the brain was performed following IV bolus contrast injection. Subsequent parametric perfusion maps  were calculated using RAPID software. CONTRAST:  166mL ISOVUE-370 IOPAMIDOL (ISOVUE-370) INJECTION 76% COMPARISON:  CT head without contrast from the same day. FINDINGS: CTA NECK FINDINGS Aortic arch: A 3 vessel arch configuration is present. There is minimal calcification at the origin of the left subclavian artery. Additional calcifications are present in the distal arch without aneurysm or stenosis. Right carotid system: The right common carotid artery is within normal limits. Atherosclerotic calcifications are present at the bifurcation. There is no significant stenosis. Cervical right ICA is within normal limits. Left carotid system: Is the left common carotid artery is within normal limits. Atherosclerotic changes are present at the bifurcation. There is no significant stenosis relative to the more distal vessel. The cervical left ICA is unremarkable. Vertebral arteries: The left vertebral artery is dominant  to the right. Both vertebral arteries originate from the subclavian arteries without significant stenosis. There tortuosity in the proximal left vertebral artery. No stenoses are present in either vertebral artery throughout the neck. Skeleton: Grade 1 anterolisthesis is present at C3-4. There is chronic loss of disc height at C4-5, C5-6, and C6-7. Other neck: The soft tissues the neck are otherwise unremarkable. Salivary glands are within normal limits bilaterally. No focal mucosal or submucosal lesions are present. The thyroid is within normal limits. There is no significant adenopathy. Upper chest: Interlobular septal thickening is present. There is no focal consolidation. Review of the MIP images confirms the above findings CTA HEAD FINDINGS Anterior circulation: Atherosclerotic irregularity is present in the cavernous and precavernous internal carotid arteries bilaterally. There is no significant stenosis through the ICA termini. The right A1 segment is hypoplastic. Anterior communicating artery is  patent. Both A2 segments fill. M1 segments are within normal limits. MCA bifurcations are normal. Is small vessel attenuation is present in the MCA branch vessels bilaterally without a significant proximal stenosis or occlusion. Posterior circulation: The left vertebral artery is the dominant vessel. PICA origins are visualized and normal bilaterally. The basilar artery is within normal limits. Both posterior cerebral arteries originate from the basilar tip. The PCA branch vessels are within normal limits. Venous sinuses: Dural sinuses are patent. The right transverse sinus is dominant. Straight sinus deep cerebral veins are intact. Cortical veins are unremarkable. Anatomic variants: None. Review of the MIP images confirms the above findings CT Brain Perfusion Findings: CBF (<30%) Volume: 33mL Perfusion (Tmax>6.0s) volume: 10mL A remote left occipital lobe infarct is noted. IMPRESSION: 1. Mild atherosclerotic changes at the origin of the left subclavian artery and at the carotid bifurcations bilaterally without significant stenosis relative to the more distal vessels. 2. Atherosclerotic irregularity within the cavernous and precavernous internal carotid arteries bilaterally without significant stenosis. 3. Distal small vessel disease in the MCA branch vessels bilaterally without significant proximal stenosis, aneurysm, or branch vessel occlusion. 4. Remote left occipital lobe infarct. 5. Perfusion imaging demonstrates no acute infarct or significant ischemia. 6. Spondylosis of the cervical spine as described. Electronically Signed   By: San Morelle M.D.   On: 12/04/2017 20:47   Ct Head Wo Contrast  Result Date: 12/04/2017 CLINICAL DATA:  Pain following fall.  Expressive aphasia. EXAM: CT HEAD WITHOUT CONTRAST TECHNIQUE: Contiguous axial images were obtained from the base of the skull through the vertex without intravenous contrast. COMPARISON:  None. FINDINGS: Brain: There is moderate generalized  ventricular enlargement. There is milder sulcal prominence diffusely. There is no appreciable intracranial mass, hemorrhage, extra-axial fluid collection, midline shift. There is a small focus of 4 decreased attenuation in the mid left cerebellum which is rather ill-defined and is concerning for a small acute infarct in the mid left cerebellar hemisphere. Elsewhere, there is patchy small vessel disease in the centra semiovale bilaterally. There is small vessel disease in the posterior limb of each internal capsule. Vascular: No hyperdense vessel. There is calcification in each carotid siphon region. Skull: The bony calvarium appears intact. Sinuses/Orbits: There is mucosal thickening in several ethmoid air cells. There is slight mucosal thickening in the lateral right maxillary antrum. Other paranasal sinuses are clear. Orbits appear symmetric bilaterally except for apparent prior cataract surgery on the left. Other: Mastoid air cells are clear. There is debris in each external auditory canal. IMPRESSION: 1. Focal area of decreased attenuation in the mid left cerebellum, concerning for recent and possibly acute infarct in this  area. 2. Atrophy with ventricles somewhat larger in proportion in sulci. Question a degree of superimposed normal pressure hydrocephalus. 3. Patchy supratentorial small vessel disease. No mass or hemorrhage. 4.  There are foci of arterial vascular calcification. 5.  Mild paranasal sinus disease. Electronically Signed   By: Lowella Grip III M.D.   On: 12/04/2017 18:59   Ct Angio Neck W Or Wo Contrast  Result Date: 12/04/2017 CLINICAL DATA:  Focal neuro deficit for greater than 6 hours. EXAM: CT ANGIOGRAPHY HEAD AND NECK CT PERFUSION BRAIN TECHNIQUE: Multidetector CT imaging of the head and neck was performed using the standard protocol during bolus administration of intravenous contrast. Multiplanar CT image reconstructions and MIPs were obtained to evaluate the vascular anatomy.  Carotid stenosis measurements (when applicable) are obtained utilizing NASCET criteria, using the distal internal carotid diameter as the denominator. Multiphase CT imaging of the brain was performed following IV bolus contrast injection. Subsequent parametric perfusion maps were calculated using RAPID software. CONTRAST:  189mL ISOVUE-370 IOPAMIDOL (ISOVUE-370) INJECTION 76% COMPARISON:  CT head without contrast from the same day. FINDINGS: CTA NECK FINDINGS Aortic arch: A 3 vessel arch configuration is present. There is minimal calcification at the origin of the left subclavian artery. Additional calcifications are present in the distal arch without aneurysm or stenosis. Right carotid system: The right common carotid artery is within normal limits. Atherosclerotic calcifications are present at the bifurcation. There is no significant stenosis. Cervical right ICA is within normal limits. Left carotid system: Is the left common carotid artery is within normal limits. Atherosclerotic changes are present at the bifurcation. There is no significant stenosis relative to the more distal vessel. The cervical left ICA is unremarkable. Vertebral arteries: The left vertebral artery is dominant to the right. Both vertebral arteries originate from the subclavian arteries without significant stenosis. There tortuosity in the proximal left vertebral artery. No stenoses are present in either vertebral artery throughout the neck. Skeleton: Grade 1 anterolisthesis is present at C3-4. There is chronic loss of disc height at C4-5, C5-6, and C6-7. Other neck: The soft tissues the neck are otherwise unremarkable. Salivary glands are within normal limits bilaterally. No focal mucosal or submucosal lesions are present. The thyroid is within normal limits. There is no significant adenopathy. Upper chest: Interlobular septal thickening is present. There is no focal consolidation. Review of the MIP images confirms the above findings CTA  HEAD FINDINGS Anterior circulation: Atherosclerotic irregularity is present in the cavernous and precavernous internal carotid arteries bilaterally. There is no significant stenosis through the ICA termini. The right A1 segment is hypoplastic. Anterior communicating artery is patent. Both A2 segments fill. M1 segments are within normal limits. MCA bifurcations are normal. Is small vessel attenuation is present in the MCA branch vessels bilaterally without a significant proximal stenosis or occlusion. Posterior circulation: The left vertebral artery is the dominant vessel. PICA origins are visualized and normal bilaterally. The basilar artery is within normal limits. Both posterior cerebral arteries originate from the basilar tip. The PCA branch vessels are within normal limits. Venous sinuses: Dural sinuses are patent. The right transverse sinus is dominant. Straight sinus deep cerebral veins are intact. Cortical veins are unremarkable. Anatomic variants: None. Review of the MIP images confirms the above findings CT Brain Perfusion Findings: CBF (<30%) Volume: 33mL Perfusion (Tmax>6.0s) volume: 29mL A remote left occipital lobe infarct is noted. IMPRESSION: 1. Mild atherosclerotic changes at the origin of the left subclavian artery and at the carotid bifurcations bilaterally without significant stenosis relative  to the more distal vessels. 2. Atherosclerotic irregularity within the cavernous and precavernous internal carotid arteries bilaterally without significant stenosis. 3. Distal small vessel disease in the MCA branch vessels bilaterally without significant proximal stenosis, aneurysm, or branch vessel occlusion. 4. Remote left occipital lobe infarct. 5. Perfusion imaging demonstrates no acute infarct or significant ischemia. 6. Spondylosis of the cervical spine as described. Electronically Signed   By: San Morelle M.D.   On: 12/04/2017 20:47   Ct Cerebral Perfusion W Contrast  Result Date:  12/04/2017 CLINICAL DATA:  Focal neuro deficit for greater than 6 hours. EXAM: CT ANGIOGRAPHY HEAD AND NECK CT PERFUSION BRAIN TECHNIQUE: Multidetector CT imaging of the head and neck was performed using the standard protocol during bolus administration of intravenous contrast. Multiplanar CT image reconstructions and MIPs were obtained to evaluate the vascular anatomy. Carotid stenosis measurements (when applicable) are obtained utilizing NASCET criteria, using the distal internal carotid diameter as the denominator. Multiphase CT imaging of the brain was performed following IV bolus contrast injection. Subsequent parametric perfusion maps were calculated using RAPID software. CONTRAST:  129mL ISOVUE-370 IOPAMIDOL (ISOVUE-370) INJECTION 76% COMPARISON:  CT head without contrast from the same day. FINDINGS: CTA NECK FINDINGS Aortic arch: A 3 vessel arch configuration is present. There is minimal calcification at the origin of the left subclavian artery. Additional calcifications are present in the distal arch without aneurysm or stenosis. Right carotid system: The right common carotid artery is within normal limits. Atherosclerotic calcifications are present at the bifurcation. There is no significant stenosis. Cervical right ICA is within normal limits. Left carotid system: Is the left common carotid artery is within normal limits. Atherosclerotic changes are present at the bifurcation. There is no significant stenosis relative to the more distal vessel. The cervical left ICA is unremarkable. Vertebral arteries: The left vertebral artery is dominant to the right. Both vertebral arteries originate from the subclavian arteries without significant stenosis. There tortuosity in the proximal left vertebral artery. No stenoses are present in either vertebral artery throughout the neck. Skeleton: Grade 1 anterolisthesis is present at C3-4. There is chronic loss of disc height at C4-5, C5-6, and C6-7. Other neck: The soft  tissues the neck are otherwise unremarkable. Salivary glands are within normal limits bilaterally. No focal mucosal or submucosal lesions are present. The thyroid is within normal limits. There is no significant adenopathy. Upper chest: Interlobular septal thickening is present. There is no focal consolidation. Review of the MIP images confirms the above findings CTA HEAD FINDINGS Anterior circulation: Atherosclerotic irregularity is present in the cavernous and precavernous internal carotid arteries bilaterally. There is no significant stenosis through the ICA termini. The right A1 segment is hypoplastic. Anterior communicating artery is patent. Both A2 segments fill. M1 segments are within normal limits. MCA bifurcations are normal. Is small vessel attenuation is present in the MCA branch vessels bilaterally without a significant proximal stenosis or occlusion. Posterior circulation: The left vertebral artery is the dominant vessel. PICA origins are visualized and normal bilaterally. The basilar artery is within normal limits. Both posterior cerebral arteries originate from the basilar tip. The PCA branch vessels are within normal limits. Venous sinuses: Dural sinuses are patent. The right transverse sinus is dominant. Straight sinus deep cerebral veins are intact. Cortical veins are unremarkable. Anatomic variants: None. Review of the MIP images confirms the above findings CT Brain Perfusion Findings: CBF (<30%) Volume: 29mL Perfusion (Tmax>6.0s) volume: 97mL A remote left occipital lobe infarct is noted. IMPRESSION: 1. Mild atherosclerotic changes at  the origin of the left subclavian artery and at the carotid bifurcations bilaterally without significant stenosis relative to the more distal vessels. 2. Atherosclerotic irregularity within the cavernous and precavernous internal carotid arteries bilaterally without significant stenosis. 3. Distal small vessel disease in the MCA branch vessels bilaterally without  significant proximal stenosis, aneurysm, or branch vessel occlusion. 4. Remote left occipital lobe infarct. 5. Perfusion imaging demonstrates no acute infarct or significant ischemia. 6. Spondylosis of the cervical spine as described. Electronically Signed   By: San Morelle M.D.   On: 12/04/2017 20:47    Procedures Procedures (including critical care time)  Medications Ordered in ED Medications  prednisoLONE acetate (PRED FORTE) 1 % ophthalmic suspension 1 drop (not administered)  ofloxacin (OCUFLOX) 0.3 % ophthalmic solution 1 drop (not administered)   stroke: mapping our early stages of recovery book (not administered)  0.9 %  sodium chloride infusion (not administered)  acetaminophen (TYLENOL) tablet 650 mg (not administered)    Or  acetaminophen (TYLENOL) solution 650 mg (not administered)    Or  acetaminophen (TYLENOL) suppository 650 mg (not administered)  enoxaparin (LOVENOX) injection 40 mg (not administered)  aspirin suppository 300 mg (not administered)    Or  aspirin tablet 325 mg (not administered)  iopamidol (ISOVUE-370) 76 % injection (100 mLs  Contrast Given 12/04/17 1951)     Initial Impression / Assessment and Plan / ED Course  I have reviewed the triage vital signs and the nursing notes.  Pertinent labs & imaging results that were available during my care of the patient were reviewed by me and considered in my medical decision making (see chart for details).    Now that Ive talked to her husband, it appears that her memory issues have been ongoing for the last several months.  She is been filling out checks incorrectly, she had to quit [employer] and her bridge club because she was unable to remember things or perform things the way that she used to.  Husband e feels like she knows the answer but is unable to say it. This has been progressive over 2 years. Likely dementia sub type.   However today he reports that he got home and she is unable to stand and unable  to ambulate well.  It sound like she is having some posterior signs concerning for new stroke.    Seen by neurology, will admit for stroke work up and initiate dementia work up.       Final Clinical Impressions(s) / ED Diagnoses   Final diagnoses:  None    ED Discharge Orders    None       Macarthur Critchley, MD 12/04/17 2353

## 2017-12-04 NOTE — Consult Note (Addendum)
Neurology Consultation  Reason for Consult: Aphasia, falls Referring Physician: Dr. Thomasene Lot  CC: Aphasia, falls  History is obtained from: Chart, husband at bedside  HPI: Tammy Arnold is a 77 y.o. female who has a past medical history of hypertension, hyperlipidemia, progressive cognitive decline, brought into the emergency room for evaluation of having had a fall.  The history is provided by the husband, who is not exactly a very good historian. He reports that she has been having problems with her memory now ongoing for a year or year and a half and the progression has been pretty rapid in terms of the deterioration of her memory. At baseline 2 years ago or so, she would play bridge 3 times a week but then stopped doing it.  Initially thought it was because of her personal choice to not do it but then she he realized that she is not able to perform her ADLs and simple tasks.  As an example, he said that she tends to ignore certain parts of her visual field and is unable to grab objects when asked.  She is also having trouble with words and sentences.  She is able to make her needs known but her speech has not been normal now for many months to over a year.  He last saw normal at 11 AM and when he returned, she was laying on the floor and probably sustained a fall.  He brought her into the emergency room for evaluation. There is no focal weakness tingling numbness reported.  No reports of headaches.  No preceding flulike symptoms.  No chest pain nausea vomiting shortness of breath palpitations.  She has not had any strokes in the past. She was having difficulty naming objects and with her speech, and was within the extended window for intervention for stroke and hence a code stroke was spaced out upon discussion with me over the phone.  After my evaluation, see my recommendations below, the code stroke was canceled.  ZWC:HENIDP to obtain due to altered mental status.  Pertinent positives  documented above as reported by the husband.  Past Medical History:  Diagnosis Date  . Arthritis   . High cholesterol   . Hypertension    No family history on file.  Social History:   reports that  has never smoked. she has never used smokeless tobacco. She reports that she does not drink alcohol. Her drug history is not on file.  Medications No current facility-administered medications for this encounter.   Current Outpatient Medications:  .  baclofen (LIORESAL) 10 MG tablet, Take 5 mg of baclofen in the morning and 10 mg at night as needed for muscle spasms., Disp: 30 each, Rfl: 0 .  Diclofenac Sodium CR (VOLTAREN-XR) 100 MG 24 hr tablet, Take 1 tablet (100 mg total) by mouth daily., Disp: 10 tablet, Rfl: 0 .  hydrochlorothiazide (HYDRODIURIL) 25 MG tablet, Take 12.5 mg by mouth daily., Disp: , Rfl: 2 .  polyethylene glycol powder (GLYCOLAX/MIRALAX) powder, Take 17 g by mouth daily., Disp: 255 g, Rfl: 0 .  simvastatin (ZOCOR) 20 MG tablet, Take 20 mg by mouth daily., Disp: , Rfl: 5  Exam: Current vital signs: BP (!) 131/97   Pulse 86   Temp 98.6 F (37 C) (Oral)   Resp (!) 23   Ht 5\' 4"  (1.626 m)   Wt 56.7 kg (125 lb)   SpO2 100%   BMI 21.46 kg/m  Vital signs in last 24 hours: Temp:  [98.6 F (  37 C)] 98.6 F (37 C) (02/10 1820) Pulse Rate:  [83-86] 86 (02/10 1930) Resp:  [14-23] 23 (02/10 1930) BP: (131-187)/(63-97) 131/97 (02/10 1930) SpO2:  [100 %] 100 % (02/10 1930) Weight:  [56.7 kg (125 lb)] 56.7 kg (125 lb) (02/10 1820) General: Awake alert in no acute distress HEENT: Normocephalic atraumatic clear nares clear throat Lungs clear to auscultation Cardiac: S1-S2 heard regular rate rhythm Abdomen: Soft nondistended nontender with normal bowel sounds Extremities: Warm well perfused Neurological exam Patient is awake, alert, oriented to self, place.  Could not tell me the date.  Poor attention concentration. She was able to tell me her correct age after a little  pause and hesitation. She was able to follow commands intermittently. Her naming is grossly impaired.  Looking at the pen, she could not name the pen but told me that it is an object used to write and draw with.  She was unable to name my wrist watch.  Unable to name a ring on my finger. Her repetition is completely intact. Cranial nerves: Pupils equal round reactive to light, extraocular movements are intact as based on tracking the observer, very difficult to assess her visual fields as it seems that she has inattention and possible problems with depth perception as she is able to look at the objects but not count fingers or reach my finger.  She was able to grab my finger on either side with much difficulty and multiple trials.  Her face is symmetric.  Facial sensation is intact.  Palate elevates symmetrically.  Tongue is midline. Motor exam: 5/5 all over with no focal drift. Her tone and bulk of the muscles is normal. Sensory exam: Intact to light touch.  Unable to reliably assess for extinction due to her poor attention concentration. Coordination: She was unable to perform finger nose finger test reliably on either right or left.  She was able to extend her arm and touch her nose but when I asked her to touch my finger she seemed unable to do that.  She instead tried to grab my finger. DTRs: Brisk all over Gait testing was deferred. 1a Level of Conscious.: 0 1b LOC Questions: 0 1c LOC Commands: 0 2 Best Gaze: 0 3 Visual: 0 4 Facial Palsy: 0 5a Motor Arm - left: 0 5b Motor Arm - Right: 0 6a Motor Leg - Left: 0 6b Motor Leg - Right: 0 7 Limb Ataxia: 2 8 Sensory: 0 9 Best Language: 2 10 Dysarthria: 0 11 Extinct. and Inatten.: 2 TOTAL: 6  Labs I have reviewed labs in epic and the results pertinent to this consultation are: CBC    Component Value Date/Time   WBC 8.7 12/04/2017 1915   RBC 4.12 12/04/2017 1915   HGB 11.9 (L) 12/04/2017 1935   HCT 35.0 (L) 12/04/2017 1935   PLT 266  12/04/2017 1915   MCV 85.7 12/04/2017 1915   MCH 28.2 12/04/2017 1915   MCHC 32.9 12/04/2017 1915   RDW 13.7 12/04/2017 1915   LYMPHSABS 1.6 12/04/2017 1915   MONOABS 0.7 12/04/2017 1915   EOSABS 0.2 12/04/2017 1915   BASOSABS 0.1 12/04/2017 1915    CMP     Component Value Date/Time   NA 140 12/04/2017 1935   K 3.4 (L) 12/04/2017 1935   CL 105 12/04/2017 1935   CO2 24 12/07/2015 2251   GLUCOSE 104 (H) 12/04/2017 1935   BUN 12 12/04/2017 1935   CREATININE 0.60 12/04/2017 1935   CALCIUM 8.7 (L) 12/07/2015 2251  PROT 6.4 (L) 12/07/2015 2251   ALBUMIN 3.3 (L) 12/07/2015 2251   AST 22 12/07/2015 2251   ALT 14 12/07/2015 2251   ALKPHOS 49 12/07/2015 2251   BILITOT 0.1 (L) 12/07/2015 2251   GFRNONAA >60 12/07/2015 2251   GFRAA >60 12/07/2015 2251   Imaging I have reviewed the images obtained:  CT-scan of the brain no evidence of bleed.  No dense vessels.  Possible hypodensity in the left cerebellum.  CT angiogram of the head and neck with mild atherosclerotic changes at the origin of the left subclavian and carotid bifurcations without significant stenosis.  Atherosclerosis within the cavernous and precavernous internal carotids bilaterally without significant stenosis.  Distal small vessel disease and MCA branches bilaterally without significant proximal stenosis aneurysm or occlusions.  Remote left occipital infarct.  No perfusion asymmetry and CT perfusion imaging.  Spondylosis of C-spine.  Assessment:  77 year old woman past history of hypertension hyperlipidemia and progressive cognitive decline without a formal diagnosis of Alzheimer's dementia brought into the emergency room for evaluation of fall.  Also noted to have difficulty with following commands and producing speech. 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 Posterior circulation strokes, Alzheimer's and neurodegenerative conditions can cause Balint's syndrome, and I think she would benefit from inpatient workup as documented below.  Patient is not a candidate for IV TPA as her last known normal is unclear and at best outside the 4-1/2-hour window. There are no exam findings or imaging finding suggestive of large vessel occlusion, hence not an endovascular treatment candidate.  Impression: --Evaluate for-Balint syndrome - no specific test, suspicion based on history and physical exam --Evaluate for-stroke due to falls and concerning CT finding - stroke w/u as below --Evaluate for- dementia - will be mostly outpatient. Obtain labs as below for reversible causes. --Low suspicion for seizure activity at this time. Will obtain routine EEG --Low on likelihood but not impossible due to presentation with visuo-spacial deficits on exam and relatively rapidly progressive memory decline CJD. An MRI might provide more answers and a spinal tap can be considered at some point if suspicion remains and alternative explanations do not seem plausible.  Recommendations: B12, TSH, RPR - for reversible causes of dementia/memory loss Telemetry monitoring Allow for permissive hypertension for the first 24-48h - only treat PRN if SBP >220 mmHg. Blood pressures can be gradually normalized to SBP<140 upon discharge. MRI brain without contrast Echocardiogram HgbA1c, fasting lipid panel Frequent neuro checks Prophylactic therapy-Antiplatelet med: Aspirin - dose 325mg  PO or 300mg  PR Atorvastatin 80 mg PO daily Risk factor modification PT consult, OT consult, Speech consult Routine EEG Outpatient neurology appointment on February 19th at [facility]  Would recommend to keep that for evaluation of cognitive decline and possible formal neuropsych evaluation.  I discussed this plan in detail with her husband, Dr. Ulanda Edison. I have also discussed the plan in detail with the  ED provider Dr. Thomasene Lot.  Please page stroke NP/PA/MD (listed on AMION)  from 8am-4 pm as this patient will be followed by the stroke team at this point.  -- Amie Portland, MD [facility] Pager: [phone] If 7pm to 7am, please call on call as listed on AMION.  CRITICAL CARE ATTESTATION This patient is critically ill and at significant risk of neurological worsening, death and care requires constant monitoring of vital signs, hemodynamics,respiratory and cardiac monitoring. I spent 60  minutes of neurocritical care time performing neurological assessment, discussion with family, other specialists and medical decision making of high complexityin the care of  this patient.

## 2017-12-04 NOTE — ED Notes (Signed)
Neuro paged to Dr. Thomasene Lot per her request

## 2017-12-04 NOTE — ED Notes (Signed)
Tammy Arnold - [name] Husband - [phone] (if hospitalist or other would like to speak to him)

## 2017-12-05 ENCOUNTER — Observation Stay (HOSPITAL_COMMUNITY): Payer: Medicare Other

## 2017-12-05 ENCOUNTER — Encounter (HOSPITAL_COMMUNITY): Payer: Self-pay | Admitting: Physical Medicine and Rehabilitation

## 2017-12-05 ENCOUNTER — Other Ambulatory Visit: Payer: Self-pay

## 2017-12-05 ENCOUNTER — Inpatient Hospital Stay (HOSPITAL_COMMUNITY): Payer: Medicare Other

## 2017-12-05 DIAGNOSIS — I5030 Unspecified diastolic (congestive) heart failure: Secondary | ICD-10-CM

## 2017-12-05 DIAGNOSIS — E785 Hyperlipidemia, unspecified: Secondary | ICD-10-CM

## 2017-12-05 DIAGNOSIS — M545 Low back pain: Secondary | ICD-10-CM | POA: Diagnosis not present

## 2017-12-05 DIAGNOSIS — H518 Other specified disorders of binocular movement: Secondary | ICD-10-CM | POA: Diagnosis present

## 2017-12-05 DIAGNOSIS — S0990XA Unspecified injury of head, initial encounter: Secondary | ICD-10-CM | POA: Diagnosis not present

## 2017-12-05 DIAGNOSIS — R270 Ataxia, unspecified: Secondary | ICD-10-CM

## 2017-12-05 DIAGNOSIS — R4189 Other symptoms and signs involving cognitive functions and awareness: Secondary | ICD-10-CM

## 2017-12-05 DIAGNOSIS — E876 Hypokalemia: Secondary | ICD-10-CM

## 2017-12-05 DIAGNOSIS — M7989 Other specified soft tissue disorders: Secondary | ICD-10-CM | POA: Diagnosis not present

## 2017-12-05 DIAGNOSIS — R339 Retention of urine, unspecified: Secondary | ICD-10-CM | POA: Diagnosis present

## 2017-12-05 DIAGNOSIS — E7800 Pure hypercholesterolemia, unspecified: Secondary | ICD-10-CM | POA: Diagnosis present

## 2017-12-05 DIAGNOSIS — Z8742 Personal history of other diseases of the female genital tract: Secondary | ICD-10-CM | POA: Diagnosis not present

## 2017-12-05 DIAGNOSIS — R483 Visual agnosia: Secondary | ICD-10-CM | POA: Diagnosis present

## 2017-12-05 DIAGNOSIS — G459 Transient cerebral ischemic attack, unspecified: Secondary | ICD-10-CM | POA: Diagnosis not present

## 2017-12-05 DIAGNOSIS — H5316 Psychophysical visual disturbances: Secondary | ICD-10-CM | POA: Diagnosis present

## 2017-12-05 DIAGNOSIS — R9389 Abnormal findings on diagnostic imaging of other specified body structures: Secondary | ICD-10-CM | POA: Diagnosis not present

## 2017-12-05 DIAGNOSIS — G4700 Insomnia, unspecified: Secondary | ICD-10-CM | POA: Diagnosis present

## 2017-12-05 DIAGNOSIS — R9401 Abnormal electroencephalogram [EEG]: Secondary | ICD-10-CM | POA: Diagnosis present

## 2017-12-05 DIAGNOSIS — H538 Other visual disturbances: Secondary | ICD-10-CM | POA: Diagnosis not present

## 2017-12-05 DIAGNOSIS — I10 Essential (primary) hypertension: Secondary | ICD-10-CM | POA: Diagnosis not present

## 2017-12-05 DIAGNOSIS — Z881 Allergy status to other antibiotic agents status: Secondary | ICD-10-CM | POA: Diagnosis not present

## 2017-12-05 DIAGNOSIS — M419 Scoliosis, unspecified: Secondary | ICD-10-CM | POA: Diagnosis present

## 2017-12-05 DIAGNOSIS — D62 Acute posthemorrhagic anemia: Secondary | ICD-10-CM

## 2017-12-05 DIAGNOSIS — G319 Degenerative disease of nervous system, unspecified: Secondary | ICD-10-CM | POA: Diagnosis present

## 2017-12-05 DIAGNOSIS — R488 Other symbolic dysfunctions: Secondary | ICD-10-CM | POA: Diagnosis present

## 2017-12-05 DIAGNOSIS — R32 Unspecified urinary incontinence: Secondary | ICD-10-CM | POA: Diagnosis not present

## 2017-12-05 DIAGNOSIS — F0280 Dementia in other diseases classified elsewhere without behavioral disturbance: Secondary | ICD-10-CM | POA: Diagnosis present

## 2017-12-05 DIAGNOSIS — R410 Disorientation, unspecified: Secondary | ICD-10-CM | POA: Diagnosis not present

## 2017-12-05 DIAGNOSIS — R509 Fever, unspecified: Secondary | ICD-10-CM | POA: Diagnosis not present

## 2017-12-05 DIAGNOSIS — I7389 Other specified peripheral vascular diseases: Secondary | ICD-10-CM | POA: Diagnosis present

## 2017-12-05 DIAGNOSIS — R159 Full incontinence of feces: Secondary | ICD-10-CM | POA: Diagnosis not present

## 2017-12-05 DIAGNOSIS — N39498 Other specified urinary incontinence: Secondary | ICD-10-CM | POA: Diagnosis not present

## 2017-12-05 DIAGNOSIS — Z79899 Other long term (current) drug therapy: Secondary | ICD-10-CM | POA: Diagnosis not present

## 2017-12-05 DIAGNOSIS — M549 Dorsalgia, unspecified: Secondary | ICD-10-CM | POA: Diagnosis present

## 2017-12-05 DIAGNOSIS — Z883 Allergy status to other anti-infective agents status: Secondary | ICD-10-CM | POA: Diagnosis not present

## 2017-12-05 DIAGNOSIS — Z792 Long term (current) use of antibiotics: Secondary | ICD-10-CM | POA: Diagnosis not present

## 2017-12-05 DIAGNOSIS — Z880 Allergy status to penicillin: Secondary | ICD-10-CM | POA: Diagnosis not present

## 2017-12-05 DIAGNOSIS — H47293 Other optic atrophy, bilateral: Secondary | ICD-10-CM | POA: Diagnosis present

## 2017-12-05 DIAGNOSIS — I679 Cerebrovascular disease, unspecified: Secondary | ICD-10-CM | POA: Diagnosis not present

## 2017-12-05 DIAGNOSIS — H539 Unspecified visual disturbance: Secondary | ICD-10-CM | POA: Diagnosis not present

## 2017-12-05 DIAGNOSIS — H510 Palsy (spasm) of conjugate gaze: Secondary | ICD-10-CM | POA: Diagnosis present

## 2017-12-05 DIAGNOSIS — Z96659 Presence of unspecified artificial knee joint: Secondary | ICD-10-CM | POA: Diagnosis present

## 2017-12-05 DIAGNOSIS — F0390 Unspecified dementia without behavioral disturbance: Secondary | ICD-10-CM | POA: Diagnosis present

## 2017-12-05 LAB — CBC
HCT: 34.4 % — ABNORMAL LOW (ref 36.0–46.0)
HCT: 35 % — ABNORMAL LOW (ref 36.0–46.0)
Hemoglobin: 11.1 g/dL — ABNORMAL LOW (ref 12.0–15.0)
Hemoglobin: 11.6 g/dL — ABNORMAL LOW (ref 12.0–15.0)
MCH: 27.6 pg (ref 26.0–34.0)
MCH: 28.2 pg (ref 26.0–34.0)
MCHC: 32.3 g/dL (ref 30.0–36.0)
MCHC: 33.1 g/dL (ref 30.0–36.0)
MCV: 85 fL (ref 78.0–100.0)
MCV: 85.6 fL (ref 78.0–100.0)
PLATELETS: 247 10*3/uL (ref 150–400)
Platelets: 257 10*3/uL (ref 150–400)
RBC: 4.02 MIL/uL (ref 3.87–5.11)
RBC: 4.12 MIL/uL (ref 3.87–5.11)
RDW: 13.7 % (ref 11.5–15.5)
RDW: 13.8 % (ref 11.5–15.5)
WBC: 6.3 10*3/uL (ref 4.0–10.5)
WBC: 7.1 10*3/uL (ref 4.0–10.5)

## 2017-12-05 LAB — COMPREHENSIVE METABOLIC PANEL
ALT: 15 U/L (ref 14–54)
AST: 21 U/L (ref 15–41)
Albumin: 3.3 g/dL — ABNORMAL LOW (ref 3.5–5.0)
Alkaline Phosphatase: 44 U/L (ref 38–126)
Anion gap: 9 (ref 5–15)
BUN: 5 mg/dL — AB (ref 6–20)
CHLORIDE: 108 mmol/L (ref 101–111)
CO2: 22 mmol/L (ref 22–32)
CREATININE: 0.71 mg/dL (ref 0.44–1.00)
Calcium: 8.8 mg/dL — ABNORMAL LOW (ref 8.9–10.3)
GFR calc Af Amer: >60 mL/min (ref >60)
GFR calc non Af Amer: >60 mL/min (ref >60)
Glucose, Bld: 88 mg/dL (ref 65–99)
Potassium: 3.2 mmol/L — ABNORMAL LOW (ref 3.5–5.1)
Sodium: 139 mmol/L (ref 135–145)
Total Bilirubin: 0.8 mg/dL (ref 0.3–1.2)
Total Protein: 6.2 g/dL — ABNORMAL LOW (ref 6.5–8.1)

## 2017-12-05 LAB — HEMOGLOBIN A1C
HEMOGLOBIN A1C: 5.4 % (ref 4.8–5.6)
MEAN PLASMA GLUCOSE: 108.28 mg/dL

## 2017-12-05 LAB — LIPID PANEL
CHOLESTEROL: 168 mg/dL (ref 0–200)
HDL: 45 mg/dL (ref >40)
LDL Cholesterol: 114 mg/dL — ABNORMAL HIGH (ref 0–99)
Total CHOL/HDL Ratio: 3.7 RATIO
Triglycerides: 46 mg/dL (ref <150)
VLDL: 9 mg/dL (ref 0–40)

## 2017-12-05 LAB — VITAMIN B12: Vitamin B-12: 367 pg/mL (ref 180–914)

## 2017-12-05 LAB — CREATININE, SERUM
Creatinine, Ser: 0.67 mg/dL (ref 0.44–1.00)
GFR calc non Af Amer: >60 mL/min (ref >60)

## 2017-12-05 LAB — ECHOCARDIOGRAM COMPLETE
Height: 64 in
WEIGHTICAEL: 2000 [oz_av]

## 2017-12-05 LAB — TSH: TSH: 2.537 u[IU]/mL (ref 0.350–4.500)

## 2017-12-05 LAB — RPR: RPR Ser Ql: NONREACTIVE

## 2017-12-05 MED ORDER — PRAVASTATIN SODIUM 20 MG PO TABS
20.0000 mg | ORAL_TABLET | Freq: Every day | ORAL | Status: DC
  Administered 2017-12-06: 20 mg via ORAL
  Filled 2017-12-05 (×2): qty 1

## 2017-12-05 MED ORDER — ASPIRIN EC 81 MG PO TBEC
81.0000 mg | DELAYED_RELEASE_TABLET | Freq: Every day | ORAL | Status: DC
  Administered 2017-12-06 – 2017-12-07 (×2): 81 mg via ORAL
  Filled 2017-12-05 (×2): qty 1

## 2017-12-05 NOTE — Consult Note (Signed)
Physical Medicine and Rehabilitation    Reason for Consult: Debility Referring Physician: Dr. Eliseo Squires   HPI: Tammy Arnold is a 77 y.o. female with history of HTN, progressive cognitive decline with difficulty speaking, blurry vision due to cataracts and recent surgery left eye. History taken from chart review.  She was admitted on 12/04/17 with fall and difficulty talking. CT head reviewed, suggestive of ?cerebellar abnormality. CTA head/neck per report, showed atherosclerotic changes with distal small vessel disease and remote left occipital lobe infarct --no perfusion deficits. Work up underway. Therapy evaluation done today revealing functional deficits and CIR recommended for follow up therapy. Patient with resulting confusion.    Review of Systems  Unable to perform ROS: Mental acuity   Past Medical History:  Diagnosis Date  . Arthritis   . High cholesterol   . Hypertension     Past Surgical History:  Procedure Laterality Date  . CATARACT EXTRACTION    . REPLACEMENT TOTAL KNEE      Family History  Problem Relation Age of Onset  . Dementia Mother     Social History:  Married. Independent PTA.  reports that  has never smoked. she has never used smokeless tobacco. She reports that she does not drink alcohol. Her drug history is not on file.     Allergies  Allergen Reactions  . Baclofen Other (See Comments)    Caused brain dysfunction  . Keflex [Cephalexin] Rash  . Macrodantin [Nitrofurantoin] Rash  . Neggram [Nalidixic Acid] Rash  . Penicillins Rash    Has patient had a PCN reaction causing immediate rash, facial/tongue/throat swelling, SOB or lightheadedness with hypotension: Yes Has patient had a PCN reaction causing severe rash involving mucus membranes or skin necrosis: No Has patient had a PCN reaction that required hospitalization: No Has patient had a PCN reaction occurring within the last 10 years: No If all of the above answers are "NO", then may  proceed with Cephalosporin use.  . Sulfa Antibiotics Rash    Medications Prior to Admission  Medication Sig Dispense Refill  . Difluprednate (DUREZOL) 0.05 % EMUL Place 1 drop into the left eye See admin instructions. Instill one drop into left eye twice daily for 3 weeks starting after surgery (surgery date 11/23/17)    . ofloxacin (OCUFLOX) 0.3 % ophthalmic solution Place 1 drop into the left eye See admin instructions. Instill one drop into left eye twice daily starting 3 days prior to surgery and continuing for 3 weeks after surgery (surgery date 11/23/17)  1  . baclofen (LIORESAL) 10 MG tablet Take 5 mg of baclofen in the morning and 10 mg at night as needed for muscle spasms. (Patient not taking: Reported on 12/04/2017) 30 each 0  . Diclofenac Sodium CR (VOLTAREN-XR) 100 MG 24 hr tablet Take 1 tablet (100 mg total) by mouth daily. (Patient not taking: Reported on 12/04/2017) 10 tablet 0  . polyethylene glycol powder (GLYCOLAX/MIRALAX) powder Take 17 g by mouth daily. (Patient not taking: Reported on 12/04/2017) 255 g 0    Home: Home Living Family/patient expects to be discharged to:: Private residence Living Arrangements: Spouse/significant other Available Help at Discharge: Family, Available PRN/intermittently Home Equipment: Gilford Rile - 2 wheels  Functional History: Prior Function Level of Independence: Needs assistance Gait / Transfers Assistance Needed: Reports walking independently. Per notes, pt with multiple falls at home but pt endorses only 1 leading to admission. ADL's / Homemaking Assistance Needed: Per notes, spouse assists with ADLs. Per pt, spouse  works during the day. Comments: Not sure of accuracy of pt's reported PLOF/history as no family members present during evaluation. Functional Status:  Mobility: Bed Mobility Overal bed mobility: Needs Assistance Bed Mobility: Supine to Sit, Sit to Supine Supine to sit: Mod assist, HOB elevated Sit to supine: Mod assist General  bed mobility comments: Multiple attempts to elevate trunk to get to EOB without success as pt gets half way up and falls posteriorly. Mod A to assist but still with posterior lean supported by UEs. Assist to bring BLEs into bed. Transfers Overall transfer level: Needs assistance Equipment used: 1 person hand held assist Transfers: Sit to/from Stand Sit to Stand: Mod assist General transfer comment: Assist to power to standing from EOB x2 with multiple attempts with Bil knees collapsing at first. Pt facing towards right side in standing despite cues to face forward. SPT bed to/from chair towards right side x2, Difficulty following commands to reach for arm rest with RUE and impaired sequencing noted as well.  Ambulation/Gait General Gait Details: Deferred as echo tech present to perform test.    ADL:    Cognition: Cognition Overall Cognitive Status: Impaired/Different from baseline Orientation Level: Disoriented to situation, Disoriented to place, Disoriented to time, Oriented to person Cognition Arousal/Alertness: Awake/alert Behavior During Therapy: Flat affect Overall Cognitive Status: Impaired/Different from baseline Area of Impairment: Orientation, Memory, Following commands, Safety/judgement, Awareness, Problem solving Orientation Level: Disoriented to, Time, Situation Memory: Decreased short-term memory Following Commands: Follows multi-step commands inconsistently, Follows one step commands with increased time Safety/Judgement: Decreased awareness of safety, Decreased awareness of deficits Awareness: Intellectual Problem Solving: Slow processing, Difficulty sequencing, Requires verbal cues, Requires tactile cues General Comments: Able to state she was in the hospital with increased time. Not able to state month despite contextual clues, "v day is coming up" or "second month of the year." Difficulty with short term memory within session- not able to recall name of hospital  previously discussed. Pt seems to have right visual field deficits, difficulty counting number of fingers in right visual field and putting RUE in gown for dressing.   Blood pressure (!) 174/81, pulse 93, temperature 99.1 F (37.3 C), temperature source Oral, resp. rate 16, height 5\' 4"  (1.626 m), weight 56.7 kg (125 lb), SpO2 97 %. Physical Exam  Nursing note and vitals reviewed. Constitutional: She appears well-developed and well-nourished. No distress.  HENT:  Head: Normocephalic and atraumatic.  Mouth/Throat: Oropharynx is clear and moist.  Eyes: Conjunctivae are normal. Pupils are equal, round, and reactive to light. Right eye exhibits no discharge. Left eye exhibits no discharge.  Neck: Normal range of motion. Neck supple.  Cardiovascular: Normal rate and regular rhythm.  Respiratory: Effort normal and breath sounds normal. No stridor.  GI: Soft. Bowel sounds are normal. She exhibits no distension. There is no tenderness.  Musculoskeletal: She exhibits no edema or tenderness.  Neurological: She is alert.  Oriented to self only. Unable to recall age or DOB. Delayed processing with difficulty following simple motor commands.  Aphasia Motor: RUE 4/5 proximal to distal with apraxia LUE: 4+/5 proximal to distal  RLE: 4-/5 proximal to distal with apraxia LLE: 4+/5 proximal to distal  Skin: Skin is warm and dry. She is not diaphoretic.  Psychiatric: Her mood appears anxious. Her speech is delayed. Cognition and memory are impaired.    Results for orders placed or performed during the hospital encounter of 12/04/17 (from the past 24 hour(s))  CBG monitoring, ED     Status: Abnormal  Collection Time: 12/04/17  7:00 PM  Result Value Ref Range   Glucose-Capillary 103 (H) 65 - 99 mg/dL   Comment 1 Notify RN    Comment 2 Document in Chart   CBC     Status: Abnormal   Collection Time: 12/04/17  7:15 PM  Result Value Ref Range   WBC 8.7 4.0 - 10.5 K/uL   RBC 4.12 3.87 - 5.11 MIL/uL    Hemoglobin 11.6 (L) 12.0 - 15.0 g/dL   HCT 35.3 (L) 36.0 - 46.0 %   MCV 85.7 78.0 - 100.0 fL   MCH 28.2 26.0 - 34.0 pg   MCHC 32.9 30.0 - 36.0 g/dL   RDW 13.7 11.5 - 15.5 %   Platelets 266 150 - 400 K/uL  Differential     Status: None   Collection Time: 12/04/17  7:15 PM  Result Value Ref Range   Neutrophils Relative % 71 %   Neutro Abs 6.2 1.7 - 7.7 K/uL   Lymphocytes Relative 18 %   Lymphs Abs 1.6 0.7 - 4.0 K/uL   Monocytes Relative 8 %   Monocytes Absolute 0.7 0.1 - 1.0 K/uL   Eosinophils Relative 2 %   Eosinophils Absolute 0.2 0.0 - 0.7 K/uL   Basophils Relative 1 %   Basophils Absolute 0.1 0.0 - 0.1 K/uL  Comprehensive metabolic panel     Status: Abnormal   Collection Time: 12/04/17  7:15 PM  Result Value Ref Range   Sodium 139 135 - 145 mmol/L   Potassium 3.3 (L) 3.5 - 5.1 mmol/L   Chloride 106 101 - 111 mmol/L   CO2 23 22 - 32 mmol/L   Glucose, Bld 105 (H) 65 - 99 mg/dL   BUN 10 6 - 20 mg/dL   Creatinine, Ser 0.69 0.44 - 1.00 mg/dL   Calcium 9.1 8.9 - 10.3 mg/dL   Total Protein 6.6 6.5 - 8.1 g/dL   Albumin 3.6 3.5 - 5.0 g/dL   AST 22 15 - 41 U/L   ALT 15 14 - 54 U/L   Alkaline Phosphatase 47 38 - 126 U/L   Total Bilirubin 0.5 0.3 - 1.2 mg/dL   GFR calc non Af Amer >60 >60 mL/min   GFR calc Af Amer >60 >60 mL/min   Anion gap 10 5 - 15  Ethanol     Status: None   Collection Time: 12/04/17  7:19 PM  Result Value Ref Range   Alcohol, Ethyl (B) <10 <10 mg/dL  Urine rapid drug screen (hosp performed)     Status: None   Collection Time: 12/04/17  7:19 PM  Result Value Ref Range   Opiates NONE DETECTED NONE DETECTED   Cocaine NONE DETECTED NONE DETECTED   Benzodiazepines NONE DETECTED NONE DETECTED   Amphetamines NONE DETECTED NONE DETECTED   Tetrahydrocannabinol NONE DETECTED NONE DETECTED   Barbiturates NONE DETECTED NONE DETECTED  Urinalysis, Routine w reflex microscopic     Status: Abnormal   Collection Time: 12/04/17  7:19 PM  Result Value Ref Range    Color, Urine YELLOW YELLOW   APPearance CLEAR CLEAR   Specific Gravity, Urine 1.013 1.005 - 1.030   pH 7.0 5.0 - 8.0   Glucose, UA NEGATIVE NEGATIVE mg/dL   Hgb urine dipstick MODERATE (A) NEGATIVE   Bilirubin Urine NEGATIVE NEGATIVE   Ketones, ur 5 (A) NEGATIVE mg/dL   Protein, ur NEGATIVE NEGATIVE mg/dL   Nitrite NEGATIVE NEGATIVE   Leukocytes, UA NEGATIVE NEGATIVE   RBC / HPF 0-5  0 - 5 RBC/hpf   WBC, UA 0-5 0 - 5 WBC/hpf   Bacteria, UA NONE SEEN NONE SEEN   Squamous Epithelial / LPF NONE SEEN NONE SEEN   Mucus PRESENT    Hyaline Casts, UA PRESENT   I-stat troponin, ED     Status: None   Collection Time: 12/04/17  7:33 PM  Result Value Ref Range   Troponin i, poc 0.00 0.00 - 0.08 ng/mL   Comment 3          I-Stat Chem 8, ED     Status: Abnormal   Collection Time: 12/04/17  7:35 PM  Result Value Ref Range   Sodium 140 135 - 145 mmol/L   Potassium 3.4 (L) 3.5 - 5.1 mmol/L   Chloride 105 101 - 111 mmol/L   BUN 12 6 - 20 mg/dL   Creatinine, Ser 0.60 0.44 - 1.00 mg/dL   Glucose, Bld 104 (H) 65 - 99 mg/dL   Calcium, Ion 1.10 (L) 1.15 - 1.40 mmol/L   TCO2 25 22 - 32 mmol/L   Hemoglobin 11.9 (L) 12.0 - 15.0 g/dL   HCT 35.0 (L) 36.0 - 46.0 %  Protime-INR     Status: None   Collection Time: 12/04/17  8:20 PM  Result Value Ref Range   Prothrombin Time 14.4 11.4 - 15.2 seconds   INR 1.13   Hemoglobin A1c     Status: None   Collection Time: 12/04/17 11:50 PM  Result Value Ref Range   Hgb A1c MFr Bld 5.4 4.8 - 5.6 %   Mean Plasma Glucose 108.28 mg/dL  CBC     Status: Abnormal   Collection Time: 12/04/17 11:50 PM  Result Value Ref Range   WBC 7.1 4.0 - 10.5 K/uL   RBC 4.12 3.87 - 5.11 MIL/uL   Hemoglobin 11.6 (L) 12.0 - 15.0 g/dL   HCT 35.0 (L) 36.0 - 46.0 %   MCV 85.0 78.0 - 100.0 fL   MCH 28.2 26.0 - 34.0 pg   MCHC 33.1 30.0 - 36.0 g/dL   RDW 13.7 11.5 - 15.5 %   Platelets 257 150 - 400 K/uL  Creatinine, serum     Status: None   Collection Time: 12/04/17 11:50 PM    Result Value Ref Range   Creatinine, Ser 0.67 0.44 - 1.00 mg/dL   GFR calc non Af Amer >60 >60 mL/min   GFR calc Af Amer >60 >60 mL/min  Lipid panel     Status: Abnormal   Collection Time: 12/05/17  7:29 AM  Result Value Ref Range   Cholesterol 168 0 - 200 mg/dL   Triglycerides 46 <150 mg/dL   HDL 45 >40 mg/dL   Total CHOL/HDL Ratio 3.7 RATIO   VLDL 9 0 - 40 mg/dL   LDL Cholesterol 114 (H) 0 - 99 mg/dL  Comprehensive metabolic panel     Status: Abnormal   Collection Time: 12/05/17  7:29 AM  Result Value Ref Range   Sodium 139 135 - 145 mmol/L   Potassium 3.2 (L) 3.5 - 5.1 mmol/L   Chloride 108 101 - 111 mmol/L   CO2 22 22 - 32 mmol/L   Glucose, Bld 88 65 - 99 mg/dL   BUN 5 (L) 6 - 20 mg/dL   Creatinine, Ser 0.71 0.44 - 1.00 mg/dL   Calcium 8.8 (L) 8.9 - 10.3 mg/dL   Total Protein 6.2 (L) 6.5 - 8.1 g/dL   Albumin 3.3 (L) 3.5 - 5.0 g/dL   AST  21 15 - 41 U/L   ALT 15 14 - 54 U/L   Alkaline Phosphatase 44 38 - 126 U/L   Total Bilirubin 0.8 0.3 - 1.2 mg/dL   GFR calc non Af Amer >60 >60 mL/min   GFR calc Af Amer >60 >60 mL/min   Anion gap 9 5 - 15  CBC     Status: Abnormal   Collection Time: 12/05/17  7:29 AM  Result Value Ref Range   WBC 6.3 4.0 - 10.5 K/uL   RBC 4.02 3.87 - 5.11 MIL/uL   Hemoglobin 11.1 (L) 12.0 - 15.0 g/dL   HCT 34.4 (L) 36.0 - 46.0 %   MCV 85.6 78.0 - 100.0 fL   MCH 27.6 26.0 - 34.0 pg   MCHC 32.3 30.0 - 36.0 g/dL   RDW 13.8 11.5 - 15.5 %   Platelets 247 150 - 400 K/uL  Vitamin B12     Status: None   Collection Time: 12/05/17  7:29 AM  Result Value Ref Range   Vitamin B-12 367 180 - 914 pg/mL  TSH     Status: None   Collection Time: 12/05/17  7:29 AM  Result Value Ref Range   TSH 2.537 0.350 - 4.500 uIU/mL   Ct Angio Head W Or Wo Contrast  Result Date: 12/04/2017 CLINICAL DATA:  Focal neuro deficit for greater than 6 hours. EXAM: CT ANGIOGRAPHY HEAD AND NECK CT PERFUSION BRAIN TECHNIQUE: Multidetector CT imaging of the head and neck was  performed using the standard protocol during bolus administration of intravenous contrast. Multiplanar CT image reconstructions and MIPs were obtained to evaluate the vascular anatomy. Carotid stenosis measurements (when applicable) are obtained utilizing NASCET criteria, using the distal internal carotid diameter as the denominator. Multiphase CT imaging of the brain was performed following IV bolus contrast injection. Subsequent parametric perfusion maps were calculated using RAPID software. CONTRAST:  140mL ISOVUE-370 IOPAMIDOL (ISOVUE-370) INJECTION 76% COMPARISON:  CT head without contrast from the same day. FINDINGS: CTA NECK FINDINGS Aortic arch: A 3 vessel arch configuration is present. There is minimal calcification at the origin of the left subclavian artery. Additional calcifications are present in the distal arch without aneurysm or stenosis. Right carotid system: The right common carotid artery is within normal limits. Atherosclerotic calcifications are present at the bifurcation. There is no significant stenosis. Cervical right ICA is within normal limits. Left carotid system: Is the left common carotid artery is within normal limits. Atherosclerotic changes are present at the bifurcation. There is no significant stenosis relative to the more distal vessel. The cervical left ICA is unremarkable. Vertebral arteries: The left vertebral artery is dominant to the right. Both vertebral arteries originate from the subclavian arteries without significant stenosis. There tortuosity in the proximal left vertebral artery. No stenoses are present in either vertebral artery throughout the neck. Skeleton: Grade 1 anterolisthesis is present at C3-4. There is chronic loss of disc height at C4-5, C5-6, and C6-7. Other neck: The soft tissues the neck are otherwise unremarkable. Salivary glands are within normal limits bilaterally. No focal mucosal or submucosal lesions are present. The thyroid is within normal limits.  There is no significant adenopathy. Upper chest: Interlobular septal thickening is present. There is no focal consolidation. Review of the MIP images confirms the above findings CTA HEAD FINDINGS Anterior circulation: Atherosclerotic irregularity is present in the cavernous and precavernous internal carotid arteries bilaterally. There is no significant stenosis through the ICA termini. The right A1 segment is hypoplastic. Anterior communicating artery  is patent. Both A2 segments fill. M1 segments are within normal limits. MCA bifurcations are normal. Is small vessel attenuation is present in the MCA branch vessels bilaterally without a significant proximal stenosis or occlusion. Posterior circulation: The left vertebral artery is the dominant vessel. PICA origins are visualized and normal bilaterally. The basilar artery is within normal limits. Both posterior cerebral arteries originate from the basilar tip. The PCA branch vessels are within normal limits. Venous sinuses: Dural sinuses are patent. The right transverse sinus is dominant. Straight sinus deep cerebral veins are intact. Cortical veins are unremarkable. Anatomic variants: None. Review of the MIP images confirms the above findings CT Brain Perfusion Findings: CBF (<30%) Volume: 30mL Perfusion (Tmax>6.0s) volume: 76mL A remote left occipital lobe infarct is noted. IMPRESSION: 1. Mild atherosclerotic changes at the origin of the left subclavian artery and at the carotid bifurcations bilaterally without significant stenosis relative to the more distal vessels. 2. Atherosclerotic irregularity within the cavernous and precavernous internal carotid arteries bilaterally without significant stenosis. 3. Distal small vessel disease in the MCA branch vessels bilaterally without significant proximal stenosis, aneurysm, or branch vessel occlusion. 4. Remote left occipital lobe infarct. 5. Perfusion imaging demonstrates no acute infarct or significant ischemia. 6.  Spondylosis of the cervical spine as described. Electronically Signed   By: San Morelle M.D.   On: 12/04/2017 20:47   Ct Head Wo Contrast  Result Date: 12/04/2017 CLINICAL DATA:  Pain following fall.  Expressive aphasia. EXAM: CT HEAD WITHOUT CONTRAST TECHNIQUE: Contiguous axial images were obtained from the base of the skull through the vertex without intravenous contrast. COMPARISON:  None. FINDINGS: Brain: There is moderate generalized ventricular enlargement. There is milder sulcal prominence diffusely. There is no appreciable intracranial mass, hemorrhage, extra-axial fluid collection, midline shift. There is a small focus of 4 decreased attenuation in the mid left cerebellum which is rather ill-defined and is concerning for a small acute infarct in the mid left cerebellar hemisphere. Elsewhere, there is patchy small vessel disease in the centra semiovale bilaterally. There is small vessel disease in the posterior limb of each internal capsule. Vascular: No hyperdense vessel. There is calcification in each carotid siphon region. Skull: The bony calvarium appears intact. Sinuses/Orbits: There is mucosal thickening in several ethmoid air cells. There is slight mucosal thickening in the lateral right maxillary antrum. Other paranasal sinuses are clear. Orbits appear symmetric bilaterally except for apparent prior cataract surgery on the left. Other: Mastoid air cells are clear. There is debris in each external auditory canal. IMPRESSION: 1. Focal area of decreased attenuation in the mid left cerebellum, concerning for recent and possibly acute infarct in this area. 2. Atrophy with ventricles somewhat larger in proportion in sulci. Question a degree of superimposed normal pressure hydrocephalus. 3. Patchy supratentorial small vessel disease. No mass or hemorrhage. 4.  There are foci of arterial vascular calcification. 5.  Mild paranasal sinus disease. Electronically Signed   By: Lowella Grip III  M.D.   On: 12/04/2017 18:59   Ct Angio Neck W Or Wo Contrast  Result Date: 12/04/2017 CLINICAL DATA:  Focal neuro deficit for greater than 6 hours. EXAM: CT ANGIOGRAPHY HEAD AND NECK CT PERFUSION BRAIN TECHNIQUE: Multidetector CT imaging of the head and neck was performed using the standard protocol during bolus administration of intravenous contrast. Multiplanar CT image reconstructions and MIPs were obtained to evaluate the vascular anatomy. Carotid stenosis measurements (when applicable) are obtained utilizing NASCET criteria, using the distal internal carotid diameter as the denominator. Multiphase  CT imaging of the brain was performed following IV bolus contrast injection. Subsequent parametric perfusion maps were calculated using RAPID software. CONTRAST:  149mL ISOVUE-370 IOPAMIDOL (ISOVUE-370) INJECTION 76% COMPARISON:  CT head without contrast from the same day. FINDINGS: CTA NECK FINDINGS Aortic arch: A 3 vessel arch configuration is present. There is minimal calcification at the origin of the left subclavian artery. Additional calcifications are present in the distal arch without aneurysm or stenosis. Right carotid system: The right common carotid artery is within normal limits. Atherosclerotic calcifications are present at the bifurcation. There is no significant stenosis. Cervical right ICA is within normal limits. Left carotid system: Is the left common carotid artery is within normal limits. Atherosclerotic changes are present at the bifurcation. There is no significant stenosis relative to the more distal vessel. The cervical left ICA is unremarkable. Vertebral arteries: The left vertebral artery is dominant to the right. Both vertebral arteries originate from the subclavian arteries without significant stenosis. There tortuosity in the proximal left vertebral artery. No stenoses are present in either vertebral artery throughout the neck. Skeleton: Grade 1 anterolisthesis is present at C3-4.  There is chronic loss of disc height at C4-5, C5-6, and C6-7. Other neck: The soft tissues the neck are otherwise unremarkable. Salivary glands are within normal limits bilaterally. No focal mucosal or submucosal lesions are present. The thyroid is within normal limits. There is no significant adenopathy. Upper chest: Interlobular septal thickening is present. There is no focal consolidation. Review of the MIP images confirms the above findings CTA HEAD FINDINGS Anterior circulation: Atherosclerotic irregularity is present in the cavernous and precavernous internal carotid arteries bilaterally. There is no significant stenosis through the ICA termini. The right A1 segment is hypoplastic. Anterior communicating artery is patent. Both A2 segments fill. M1 segments are within normal limits. MCA bifurcations are normal. Is small vessel attenuation is present in the MCA branch vessels bilaterally without a significant proximal stenosis or occlusion. Posterior circulation: The left vertebral artery is the dominant vessel. PICA origins are visualized and normal bilaterally. The basilar artery is within normal limits. Both posterior cerebral arteries originate from the basilar tip. The PCA branch vessels are within normal limits. Venous sinuses: Dural sinuses are patent. The right transverse sinus is dominant. Straight sinus deep cerebral veins are intact. Cortical veins are unremarkable. Anatomic variants: None. Review of the MIP images confirms the above findings CT Brain Perfusion Findings: CBF (<30%) Volume: 7mL Perfusion (Tmax>6.0s) volume: 78mL A remote left occipital lobe infarct is noted. IMPRESSION: 1. Mild atherosclerotic changes at the origin of the left subclavian artery and at the carotid bifurcations bilaterally without significant stenosis relative to the more distal vessels. 2. Atherosclerotic irregularity within the cavernous and precavernous internal carotid arteries bilaterally without significant  stenosis. 3. Distal small vessel disease in the MCA branch vessels bilaterally without significant proximal stenosis, aneurysm, or branch vessel occlusion. 4. Remote left occipital lobe infarct. 5. Perfusion imaging demonstrates no acute infarct or significant ischemia. 6. Spondylosis of the cervical spine as described. Electronically Signed   By: San Morelle M.D.   On: 12/04/2017 20:47   Ct Cerebral Perfusion W Contrast  Result Date: 12/04/2017 CLINICAL DATA:  Focal neuro deficit for greater than 6 hours. EXAM: CT ANGIOGRAPHY HEAD AND NECK CT PERFUSION BRAIN TECHNIQUE: Multidetector CT imaging of the head and neck was performed using the standard protocol during bolus administration of intravenous contrast. Multiplanar CT image reconstructions and MIPs were obtained to evaluate the vascular anatomy. Carotid stenosis measurements (  when applicable) are obtained utilizing NASCET criteria, using the distal internal carotid diameter as the denominator. Multiphase CT imaging of the brain was performed following IV bolus contrast injection. Subsequent parametric perfusion maps were calculated using RAPID software. CONTRAST:  153mL ISOVUE-370 IOPAMIDOL (ISOVUE-370) INJECTION 76% COMPARISON:  CT head without contrast from the same day. FINDINGS: CTA NECK FINDINGS Aortic arch: A 3 vessel arch configuration is present. There is minimal calcification at the origin of the left subclavian artery. Additional calcifications are present in the distal arch without aneurysm or stenosis. Right carotid system: The right common carotid artery is within normal limits. Atherosclerotic calcifications are present at the bifurcation. There is no significant stenosis. Cervical right ICA is within normal limits. Left carotid system: Is the left common carotid artery is within normal limits. Atherosclerotic changes are present at the bifurcation. There is no significant stenosis relative to the more distal vessel. The cervical left  ICA is unremarkable. Vertebral arteries: The left vertebral artery is dominant to the right. Both vertebral arteries originate from the subclavian arteries without significant stenosis. There tortuosity in the proximal left vertebral artery. No stenoses are present in either vertebral artery throughout the neck. Skeleton: Grade 1 anterolisthesis is present at C3-4. There is chronic loss of disc height at C4-5, C5-6, and C6-7. Other neck: The soft tissues the neck are otherwise unremarkable. Salivary glands are within normal limits bilaterally. No focal mucosal or submucosal lesions are present. The thyroid is within normal limits. There is no significant adenopathy. Upper chest: Interlobular septal thickening is present. There is no focal consolidation. Review of the MIP images confirms the above findings CTA HEAD FINDINGS Anterior circulation: Atherosclerotic irregularity is present in the cavernous and precavernous internal carotid arteries bilaterally. There is no significant stenosis through the ICA termini. The right A1 segment is hypoplastic. Anterior communicating artery is patent. Both A2 segments fill. M1 segments are within normal limits. MCA bifurcations are normal. Is small vessel attenuation is present in the MCA branch vessels bilaterally without a significant proximal stenosis or occlusion. Posterior circulation: The left vertebral artery is the dominant vessel. PICA origins are visualized and normal bilaterally. The basilar artery is within normal limits. Both posterior cerebral arteries originate from the basilar tip. The PCA branch vessels are within normal limits. Venous sinuses: Dural sinuses are patent. The right transverse sinus is dominant. Straight sinus deep cerebral veins are intact. Cortical veins are unremarkable. Anatomic variants: None. Review of the MIP images confirms the above findings CT Brain Perfusion Findings: CBF (<30%) Volume: 25mL Perfusion (Tmax>6.0s) volume: 41mL A remote left  occipital lobe infarct is noted. IMPRESSION: 1. Mild atherosclerotic changes at the origin of the left subclavian artery and at the carotid bifurcations bilaterally without significant stenosis relative to the more distal vessels. 2. Atherosclerotic irregularity within the cavernous and precavernous internal carotid arteries bilaterally without significant stenosis. 3. Distal small vessel disease in the MCA branch vessels bilaterally without significant proximal stenosis, aneurysm, or branch vessel occlusion. 4. Remote left occipital lobe infarct. 5. Perfusion imaging demonstrates no acute infarct or significant ischemia. 6. Spondylosis of the cervical spine as described. Electronically Signed   By: San Morelle M.D.   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  Records reviewed and summated above.  1. Does the need for close, 24 hr/day medical supervision in concert with the patient's rehab needs make it unreasonable for this patient to be served in a less intensive  setting? Potentially  2. Co-Morbidities requiring supervision/potential complications: HTN (monitor and provide prns in accordance with increased physical exertion and pain), progressive cognitive decline with difficulty speaking, blurry vision due to cataracts and recent surgery left eye, hypokalemia (continue to monitor and replete as necessary), ABLA (transfuse if necessary to ensure appropriate perfusion for increased activity tolerance) 3. Due to safety, disease management, medication administration and patient education, does the patient require 24 hr/day rehab nursing? Yes 4. Does the patient require coordinated care of a physician, rehab nurse, PT (1-2 hrs/day, 5 days/week), OT (1-2 hrs/day, 5 days/week) and SLP (1-2 hrs/day, 5 days/week) to address physical and functional deficits in the context of the above medical diagnosis(es)? Yes Addressing  deficits in the following areas: balance, endurance, locomotion, strength, transferring, bathing, dressing, toileting, cognition and psychosocial support 5. Can the patient actively participate in an intensive therapy program of at least 3 hrs of therapy per day at least 5 days per week? Yes 6. The potential for patient to make measurable gains while on inpatient rehab is good 7. Anticipated functional outcomes upon discharge from inpatient rehab are min assist  with PT, min assist with OT, min assist and mod assist with SLP. 8. Estimated rehab length of stay to reach the above functional goals is: 14-17 days. 9. Anticipated D/C setting: Home 10. Anticipated post D/C treatments: HH therapy and Home excercise program 11. Overall Rehab/Functional Prognosis: good  RECOMMENDATIONS: This patient's condition is appropriate for continued rehabilitative care in the following setting: Will await completion of medical workup and will also need to inquire about baseline level of functioning.  Patient has agreed to participate in recommended program. Potentially Note that insurance prior authorization may be required for reimbursement for recommended care.  Comment: Rehab Admissions Coordinator to follow up.  Delice Lesch, MD, ABPMR Bary Leriche, PA-C 12/05/2017

## 2017-12-05 NOTE — Evaluation (Signed)
Occupational Therapy Evaluation Patient Details Name: Tammy Arnold MRN: [id number] DOB: 1941-01-19 Today's Date: 12/05/2017    History of Present Illness Patient is a 77 y/o female who presents s/p fall and with aphasia. Per spouse, pt with cognitive decline over the last year as well as memory issues. Head CT, CTA-unremarkable. MRI pending. 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 Workup pending. PMH includes HTN, high cholesterol.    Clinical Impression   Pt admitted with above. She demonstrates the below listed deficits and will benefit from continued OT to maximize safety and independence with BADLs.  Pt presents to OT with deficits in cognition, vision, motor planning, balance, coordination.  She currently requires mod A for ADLs and functional transfers.   She lived alone with spouse and per chart, has had a progressive decline in function over the past 1+ years, and has required assist with ADLs.  Feel she would benefit from the intensity and consistency of therapies on CIR to allow her to maximize independence with ADLs and functional mobility and reduce burden of care. Will follow acutely.       Follow Up Recommendations  CIR;Supervision/Assistance - 24 hour    Equipment Recommendations  3 in 1 bedside commode    Recommendations for Other Services Rehab consult     Precautions / Restrictions Precautions Precautions: Fall      Mobility Bed Mobility                  Transfers Overall transfer level: Needs assistance Equipment used: 1 person hand held assist Transfers: Sit to/from Stand Sit to Stand: Mod assist         General transfer comment: assist to move into standing and assist for balance     Balance Overall balance assessment: Needs assistance Sitting-balance support: Feet supported;Bilateral upper extremity supported Sitting balance-Leahy Scale: Poor Sitting balance - Comments: Requires BUEs to maintain static sitting balance with  posterior lean, more notable during AROM BLEs.  Postural control: Posterior lean Standing balance support: Single extremity supported Standing balance-Leahy Scale: Poor                             ADL either performed or assessed with clinical judgement   ADL Overall ADL's : Needs assistance/impaired Eating/Feeding: Minimal assistance;Sitting   Grooming: Oral care;Minimal assistance;Sitting Grooming Details (indicate cue type and reason): Pt unable to locate toothpaste and toothbrush on her bedside table despite it being placed right in front of her.  She repeatedly reached for cups on the sides of her bedside table.  once those objects were removed, she was able to locate the toothpaste and toothbrush with mod cues.  She required mod A to remove the top from the toothpaste, then proceeded to apply toothpaste to the handle of the toothbrush - mod A to correct.  She then required once cue to initiate brushing her teeth.  After spitting and rinsing mouth the first time, she required cues to reinitiate the task.  She was able to let me know she needed to continue, but was unable to do so without prompting.  Upper Body Bathing: Moderate assistance;Sitting   Lower Body Bathing: Moderate assistance;Sit to/from stand   Upper Body Dressing : Maximal assistance;Sitting   Lower Body Dressing: Moderate assistance;Sit to/from stand Lower Body Dressing Details (indicate cue type and reason): able to don/doff socks with increased time and effort  Toilet Transfer: Moderate assistance;Stand-pivot;BSC   Toileting- Clothing Manipulation  and Hygiene: Moderate assistance;Sit to/from stand       Functional mobility during ADLs: Moderate assistance       Vision Baseline Vision/History: Wears glasses Additional Comments: Pt is able to track object in all quadrant, but she is unable to locate items intermittently in all visual fields.  She tends to be able to locate items in central fields more  consistently, but at times, will not see the items in her central fields and will instead reach for items in her periphery.  When asked to place an object in my hand she will completely bypass my hand, and move the item toward my chest or my face.       Perception Perception Perception Tested?: Yes Perception Deficits: Body part identification Spatial deficits: Pt able to identify body parts with ~75% accuracy - she was unable to identify nor locate her ear.  Required max multimodal cues to do so    Praxis Praxis Praxis tested?: Deficits Deficits: Ideomotor;Perseveration    Pertinent Vitals/Pain Pain Assessment: Faces Faces Pain Scale: Hurts little more Pain Location: Lt hip or back.  Pt with grimace, then rub this area  Pain Descriptors / Indicators: Grimacing;Guarding Pain Intervention(s): Monitored during session     Hand Dominance Right   Extremity/Trunk Assessment Upper Extremity Assessment Upper Extremity Assessment: RUE deficits/detail;LUE deficits/detail RUE Deficits / Details: dysmetria noted  RUE Coordination: decreased fine motor LUE Deficits / Details: dysmetria noted  LUE Coordination: decreased fine motor   Lower Extremity Assessment Lower Extremity Assessment: Defer to PT evaluation   Cervical / Trunk Assessment Cervical / Trunk Assessment: Kyphotic   Communication Communication Communication: Expressive difficulties   Cognition Arousal/Alertness: Awake/alert Behavior During Therapy: WFL for tasks assessed/performed Overall Cognitive Status: Impaired/Different from baseline Area of Impairment: Orientation;Attention;Memory;Following commands;Safety/judgement;Awareness;Problem solving                 Orientation Level: Disoriented to;Time Current Attention Level: Sustained Memory: Decreased short-term memory Following Commands: Follows multi-step commands inconsistently;Follows one step commands inconsistently Safety/Judgement: Decreased awareness of  deficits;Decreased awareness of safety     General Comments: cognition very difficult to accurately assess due to communication deficits, apraxias, and visual deficits.   She will follow one step commands 75% of time, but will have intermittent difficulty point to body parts, reaching for objects, but this may be due to apraxia and scanning as well as attentional deficits.  She displays some awareness of her deficits, and states "I should know this".  She states she can no longer drive "it's not safe to do so".     General Comments       Exercises     Shoulder Instructions      Home Living Family/patient expects to be discharged to:: Private residence Living Arrangements: Spouse/significant other Available Help at Discharge: Family;Available PRN/intermittently Type of Home: House Home Access: Stairs to enter [employer] of Steps: pt unable to state    Home Layout: Two level Alternate Level Stairs-Number of Steps: Pt unable to state details but indicates they could live on main level              Home Equipment: Walker - 2 wheels   Additional Comments: spouse not present.  Pt only able to provide limited info       Prior Functioning/Environment Level of Independence: Needs assistance  Gait / Transfers Assistance Needed: Reports walking independently. Per notes, pt with multiple falls at home but pt endorses only 1 leading to admission. ADL's / [employer]  Needed: Per notes, spouse assists with ADLs. Per pt, spouse works during the day.   Comments: Not sure of accuracy of pt's reported PLOF/history as no family members present during evaluation.        OT Problem List: Decreased activity tolerance;Impaired balance (sitting and/or standing);Impaired vision/perception;Decreased coordination;Decreased cognition;Decreased safety awareness;Decreased knowledge of use of DME or AE      OT Treatment/Interventions: Self-care/ADL training;Neuromuscular  education;DME and/or AE instruction;Therapeutic activities;Cognitive remediation/compensation;Visual/perceptual remediation/compensation;Patient/family education;Balance training    OT Goals(Current goals can be found in the care plan section) Acute Rehab OT Goals Patient Stated Goal: pt unable to state  OT Goal Formulation: With patient Time For Goal Achievement: 12/12/17 Potential to Achieve Goals: Good ADL Goals Pt Will Perform Grooming: with min assist;standing Pt Will Perform Upper Body Bathing: with min assist;sitting Pt Will Perform Lower Body Bathing: with min assist;sit to/from stand Pt Will Perform Upper Body Dressing: with min assist;sitting Pt Will Perform Lower Body Dressing: with min assist;sit to/from stand Pt Will Transfer to Toilet: with min assist;ambulating;regular height toilet;grab bars Pt Will Perform Toileting - Clothing Manipulation and hygiene: with min assist;sit to/from stand Additional ADL Goal #1: Pt will correctly identify familiar grooming object in an array of no more than 2 objects Additional ADL Goal #2: Pt will utilize and organized scan path with min cues with in her central visual field Additional ADL Goal #3: Pt will be able to perform saccadic jumb between two objects consistently with no more than 2 cues  OT Frequency: Min 3X/week   Barriers to D/C:            Co-evaluation              AM-PAC PT "6 Clicks" Daily Activity     Outcome Measure Help from another person eating meals?: A Little Help from another person taking care of personal grooming?: A Lot Help from another person toileting, which includes using toliet, bedpan, or urinal?: A Lot Help from another person bathing (including washing, rinsing, drying)?: A Lot Help from another person to put on and taking off regular upper body clothing?: A Lot Help from another person to put on and taking off regular lower body clothing?: A Lot 6 Click Score: 13   End of Session Nurse  Communication: Mobility status  Activity Tolerance: Patient tolerated treatment well Patient left: in chair;with call bell/phone within reach;with chair alarm set  OT Visit Diagnosis: Unsteadiness on feet (R26.81);Cognitive communication deficit (R41.841) Symptoms and signs involving cognitive functions: Cerebral infarction                Time: 5956-3875 OT Time Calculation (min): 33 min Charges:  OT General Charges $OT Visit: 1 Visit OT Evaluation $OT Eval Moderate Complexity: 1 Mod OT Treatments $Self Care/Home Management : 8-22 mins G-Codes:     [facility], OTR/L [phone]   Lucille Passy M 12/05/2017, 6:54 PM

## 2017-12-05 NOTE — Evaluation (Signed)
Physical Therapy Evaluation Patient Details Name: Tammy Arnold MRN: [id number] DOB: Oct 04, 1941 Today's Date: 12/05/2017   History of Present Illness  Patient is a 77 y/o female who presents s/p fall and with aphasia. Per spouse, pt with cognitive decline over the last year as well as memory issues. Head CT, CTA-unremarkable. MRI pending. 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 Workup pending. PMH includes HTN, high cholesterol.   Clinical Impression  Patient presents with right visual field deficits, possibly language deficits?, impaired memory, decreased problem solving, impaired ability to follow multi step commands, poor awareness and impaired balance/mobility s/p above. Pt demonstrates impaired sequencing and motor planning requiring Min-Mod A to perform SPT. Question vision also impacting mobility. Pt not a great historian and spouse not present so difficult to get accurate history of symptoms. At this time, pt not safe to return home. Concerned about pt's ability to retain learned information due to memory issues and cognitive decline however rehab may be beneficial to educate spouse on techniques to assist and ease burden of care at home. Would benefit from post acute rehab to maximize independence and mobility prior to return home. Will follow acutely.    Follow Up Recommendations CIR    Equipment Recommendations  None recommended by PT    Recommendations for Other Services Rehab consult     Precautions / Restrictions Precautions Precautions: Fall Restrictions Weight Bearing Restrictions: No      Mobility  Bed Mobility Overal bed mobility: Needs Assistance Bed Mobility: Supine to Sit;Sit to Supine     Supine to sit: Mod assist;HOB elevated Sit to supine: Mod assist   General bed mobility comments: Multiple attempts to elevate trunk to get to EOB without success as pt gets half way up and falls posteriorly. Mod A to assist but still with posterior lean supported  by UEs. Assist to bring BLEs into bed.  Transfers Overall transfer level: Needs assistance Equipment used: 1 person hand held assist Transfers: Sit to/from Stand Sit to Stand: Mod assist         General transfer comment: Assist to power to standing from EOB x2 with multiple attempts with Bil knees collapsing at first. Pt facing towards right side in standing despite cues to face forward. SPT bed to/from chair towards right side x2, Difficulty following commands to reach for arm rest with RUE and impaired sequencing noted as well.   Ambulation/Gait             General Gait Details: Deferred as echo tech present to perform test.  Stairs            Wheelchair Mobility    Modified Rankin (Stroke Patients Only) Modified Rankin (Stroke Patients Only) Pre-Morbid Rankin Score: Moderately severe disability Modified Rankin: Moderately severe disability     Balance Overall balance assessment: Needs assistance Sitting-balance support: Feet supported;Bilateral upper extremity supported Sitting balance-Leahy Scale: Poor Sitting balance - Comments: Requires BUEs to maintain static sitting balance with posterior lean, more notable during AROM BLEs.  Postural control: Posterior lean Standing balance support: During functional activity;Single extremity supported Standing balance-Leahy Scale: Poor Standing balance comment: Reilant on external support for standing balance.                             Pertinent Vitals/Pain Pain Assessment: Faces Faces Pain Scale: No hurt    Home Living Family/patient expects to be discharged to:: Private residence Living Arrangements: Spouse/significant other Available Help at  Discharge: Family;Available PRN/intermittently           Home Equipment: Walker - 2 wheels      Prior Function Level of Independence: Needs assistance   Gait / Transfers Assistance Needed: Reports walking independently. Per notes, pt with multiple falls  at home but pt endorses only 1 leading to admission.  ADL's / Homemaking Assistance Needed: Per notes, spouse assists with ADLs. Per pt, spouse works during the day.  Comments: Not sure of accuracy of pt's reported PLOF/history as no family members present during evaluation.     Hand Dominance        Extremity/Trunk Assessment   Upper Extremity Assessment Upper Extremity Assessment: Defer to OT evaluation    Lower Extremity Assessment Lower Extremity Assessment: Generalized weakness;Difficult to assess due to impaired cognition(Grossly ~3+/5 throughout BLEs. Reports sensation WFL.)    Cervical / Trunk Assessment Cervical / Trunk Assessment: Kyphotic  Communication   Communication: Expressive difficulties;Receptive difficulties  Cognition Arousal/Alertness: Awake/alert Behavior During Therapy: Flat affect Overall Cognitive Status: Impaired/Different from baseline Area of Impairment: Orientation;Memory;Following commands;Safety/judgement;Awareness;Problem solving                 Orientation Level: Disoriented to;Time;Situation   Memory: Decreased short-term memory Following Commands: Follows multi-step commands inconsistently;Follows one step commands with increased time Safety/Judgement: Decreased awareness of safety;Decreased awareness of deficits Awareness: Intellectual Problem Solving: Slow processing;Difficulty sequencing;Requires verbal cues;Requires tactile cues General Comments: Able to state she was in the hospital with increased time. Not able to state month despite contextual clues, "v day is coming up" or "second month of the year." Difficulty with short term memory within session- not able to recall name of hospital previously discussed. Pt seems to have right visual field deficits, difficulty counting number of fingers in right visual field and putting RUE in gown for dressing.      General Comments General comments (skin integrity, edema, etc.): HR  stable.    Exercises     Assessment/Plan    PT Assessment Patient needs continued PT services  PT Problem List Decreased mobility;Decreased safety awareness;Decreased cognition;Decreased balance       PT Treatment Interventions DME instruction;Functional mobility training;Balance training;Patient/family education;Therapeutic activities;Therapeutic exercise;Cognitive remediation;Gait training    PT Goals (Current goals can be found in the Care Plan section)  Acute Rehab PT Goals Patient Stated Goal: feel better PT Goal Formulation: With patient Time For Goal Achievement: 12/19/17 Potential to Achieve Goals: Good    Frequency Min 3X/week   Barriers to discharge Decreased caregiver support      Co-evaluation               AM-PAC PT "6 Clicks" Daily Activity  Outcome Measure Difficulty turning over in bed (including adjusting bedclothes, sheets and blankets)?: Unable Difficulty moving from lying on back to sitting on the side of the bed? : Unable Difficulty sitting down on and standing up from a chair with arms (e.g., wheelchair, bedside commode, etc,.)?: Unable Help needed moving to and from a bed to chair (including a wheelchair)?: A Lot Help needed walking in hospital room?: A Lot Help needed climbing 3-5 steps with a railing? : Total 6 Click Score: 8    End of Session Equipment Utilized During Treatment: Gait belt Activity Tolerance: Patient tolerated treatment well Patient left: in bed;with call bell/phone within reach;with bed alarm set;Other (comment)(with ECHO tech present in room) Nurse Communication: Mobility status PT Visit Diagnosis: Unsteadiness on feet (R26.81);Difficulty in walking, not elsewhere classified (R26.2)    Time:  6948-5462 PT Time Calculation (min) (ACUTE ONLY): 21 min   Charges:   PT Evaluation $PT Eval Moderate Complexity: 1 Mod PT Treatments $Therapeutic Activity: 8-22 mins   PT G Codes:        Wray Kearns, PT,  DPT [phone]    Tammy Arnold 12/05/2017, 9:20 AM

## 2017-12-05 NOTE — Progress Notes (Signed)
STROKE TEAM PROGRESS NOTE   SUBJECTIVE (INTERVAL HISTORY) Her RN is at the bedside.  Overall she feels her condition is unchanged. 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    OBJECTIVE Temp:  [98.6 F (37 C)-99.1 F (37.3 C)] 99.1 F (37.3 C) (02/11 1015) Pulse Rate:  [73-102] 93 (02/11 1015) Cardiac Rhythm: Normal sinus rhythm (02/11 0708) Resp:  [12-26] 16 (02/11 1015) BP: (126-187)/(60-106) 174/81 (02/11 1015) SpO2:  [86 %-100 %] 97 % (02/11 1015) Weight:  [125 lb (56.7 kg)] 125 lb (56.7 kg) (02/10 1820)  Recent Labs  Lab 12/04/17 1900  GLUCAP 103*   Recent Labs  Lab 12/04/17 1915 12/04/17 1935 12/04/17 2350 12/05/17 0729  NA 139 140  --  139  K 3.3* 3.4*  --  3.2*  CL 106 105  --  108  CO2 23  --   --  22  GLUCOSE 105* 104*  --  88  BUN 10 12  --  5*  CREATININE 0.69 0.60 0.67 0.71  CALCIUM 9.1  --   --  8.8*   Recent Labs  Lab 12/04/17 1915 12/05/17 0729  AST 22 21  ALT 15 15  ALKPHOS 47 44  BILITOT 0.5 0.8  PROT 6.6 6.2*  ALBUMIN 3.6 3.3*   Recent Labs  Lab 12/04/17 1915 12/04/17 1935 12/04/17 2350 12/05/17 0729  WBC 8.7  --  7.1 6.3  NEUTROABS 6.2  --   --   --   HGB 11.6* 11.9* 11.6* 11.1*  HCT 35.3* 35.0* 35.0* 34.4*  MCV 85.7  --  85.0 85.6  PLT 266  --  257 247   No results for input(s): CKTOTAL, CKMB, CKMBINDEX, TROPONINI in the last 168 hours. Recent Labs    12/04/17 2020  LABPROT 14.4  INR 1.13   Recent Labs    12/04/17 1919  COLORURINE YELLOW  LABSPEC 1.013  PHURINE 7.0  GLUCOSEU NEGATIVE  HGBUR MODERATE*  BILIRUBINUR NEGATIVE  KETONESUR 5*  PROTEINUR NEGATIVE  NITRITE NEGATIVE  LEUKOCYTESUR NEGATIVE       Component Value Date/Time   CHOL 168 12/05/2017 0729   TRIG 46 12/05/2017 0729   HDL 45 12/05/2017 0729   CHOLHDL 3.7 12/05/2017 0729   VLDL 9 12/05/2017 0729   LDLCALC 114 (H) 12/05/2017 0729   Lab Results  Component Value Date   HGBA1C 5.4  12/04/2017      Component Value Date/Time   LABOPIA NONE DETECTED 12/04/2017 1919   COCAINSCRNUR NONE DETECTED 12/04/2017 1919   LABBENZ NONE DETECTED 12/04/2017 1919   AMPHETMU NONE DETECTED 12/04/2017 1919   THCU NONE DETECTED 12/04/2017 1919   LABBARB NONE DETECTED 12/04/2017 1919    Recent Labs  Lab 12/04/17 1919  ETH <10    I have personally reviewed the radiological images below and agree with the radiology interpretations.  Ct Angio Head W Or Wo Contrast  Result Date: 12/04/2017 CLINICAL DATA:  Focal neuro deficit for greater than 6 hours. EXAM: CT ANGIOGRAPHY HEAD AND NECK CT PERFUSION BRAIN TECHNIQUE: Multidetector CT imaging of the head and neck was performed using the standard protocol during bolus administration of intravenous contrast. Multiplanar CT image reconstructions and MIPs were obtained to evaluate the vascular anatomy. Carotid stenosis measurements (when applicable) are obtained utilizing NASCET criteria, using the distal internal carotid diameter as the denominator. Multiphase CT imaging of the brain was performed following IV bolus contrast injection. Subsequent parametric perfusion maps were calculated using RAPID  software. CONTRAST:  122mL ISOVUE-370 IOPAMIDOL (ISOVUE-370) INJECTION 76% COMPARISON:  CT head without contrast from the same day. FINDINGS: CTA NECK FINDINGS Aortic arch: A 3 vessel arch configuration is present. There is minimal calcification at the origin of the left subclavian artery. Additional calcifications are present in the distal arch without aneurysm or stenosis. Right carotid system: The right common carotid artery is within normal limits. Atherosclerotic calcifications are present at the bifurcation. There is no significant stenosis. Cervical right ICA is within normal limits. Left carotid system: Is the left common carotid artery is within normal limits. Atherosclerotic changes are present at the bifurcation. There is no significant stenosis  relative to the more distal vessel. The cervical left ICA is unremarkable. Vertebral arteries: The left vertebral artery is dominant to the right. Both vertebral arteries originate from the subclavian arteries without significant stenosis. There tortuosity in the proximal left vertebral artery. No stenoses are present in either vertebral artery throughout the neck. Skeleton: Grade 1 anterolisthesis is present at C3-4. There is chronic loss of disc height at C4-5, C5-6, and C6-7. Other neck: The soft tissues the neck are otherwise unremarkable. Salivary glands are within normal limits bilaterally. No focal mucosal or submucosal lesions are present. The thyroid is within normal limits. There is no significant adenopathy. Upper chest: Interlobular septal thickening is present. There is no focal consolidation. Review of the MIP images confirms the above findings CTA HEAD FINDINGS Anterior circulation: Atherosclerotic irregularity is present in the cavernous and precavernous internal carotid arteries bilaterally. There is no significant stenosis through the ICA termini. The right A1 segment is hypoplastic. Anterior communicating artery is patent. Both A2 segments fill. M1 segments are within normal limits. MCA bifurcations are normal. Is small vessel attenuation is present in the MCA branch vessels bilaterally without a significant proximal stenosis or occlusion. Posterior circulation: The left vertebral artery is the dominant vessel. PICA origins are visualized and normal bilaterally. The basilar artery is within normal limits. Both posterior cerebral arteries originate from the basilar tip. The PCA branch vessels are within normal limits. Venous sinuses: Dural sinuses are patent. The right transverse sinus is dominant. Straight sinus deep cerebral veins are intact. Cortical veins are unremarkable. Anatomic variants: None. Review of the MIP images confirms the above findings CT Brain Perfusion Findings: CBF (<30%)  Volume: 56mL Perfusion (Tmax>6.0s) volume: 33mL A remote left occipital lobe infarct is noted. IMPRESSION: 1. Mild atherosclerotic changes at the origin of the left subclavian artery and at the carotid bifurcations bilaterally without significant stenosis relative to the more distal vessels. 2. Atherosclerotic irregularity within the cavernous and precavernous internal carotid arteries bilaterally without significant stenosis. 3. Distal small vessel disease in the MCA branch vessels bilaterally without significant proximal stenosis, aneurysm, or branch vessel occlusion. 4. Remote left occipital lobe infarct. 5. Perfusion imaging demonstrates no acute infarct or significant ischemia. 6. Spondylosis of the cervical spine as described. Electronically Signed   By: San Morelle M.D.   On: 12/04/2017 20:47   Ct Head Wo Contrast  Result Date: 12/04/2017 CLINICAL DATA:  Pain following fall.  Expressive aphasia. EXAM: CT HEAD WITHOUT CONTRAST TECHNIQUE: Contiguous axial images were obtained from the base of the skull through the vertex without intravenous contrast. COMPARISON:  None. FINDINGS: Brain: There is moderate generalized ventricular enlargement. There is milder sulcal prominence diffusely. There is no appreciable intracranial mass, hemorrhage, extra-axial fluid collection, midline shift. There is a small focus of 4 decreased attenuation in the mid left cerebellum which is rather  ill-defined and is concerning for a small acute infarct in the mid left cerebellar hemisphere. Elsewhere, there is patchy small vessel disease in the centra semiovale bilaterally. There is small vessel disease in the posterior limb of each internal capsule. Vascular: No hyperdense vessel. There is calcification in each carotid siphon region. Skull: The bony calvarium appears intact. Sinuses/Orbits: There is mucosal thickening in several ethmoid air cells. There is slight mucosal thickening in the lateral right maxillary antrum.  Other paranasal sinuses are clear. Orbits appear symmetric bilaterally except for apparent prior cataract surgery on the left. Other: Mastoid air cells are clear. There is debris in each external auditory canal. IMPRESSION: 1. Focal area of decreased attenuation in the mid left cerebellum, concerning for recent and possibly acute infarct in this area. 2. Atrophy with ventricles somewhat larger in proportion in sulci. Question a degree of superimposed normal pressure hydrocephalus. 3. Patchy supratentorial small vessel disease. No mass or hemorrhage. 4.  There are foci of arterial vascular calcification. 5.  Mild paranasal sinus disease. Electronically Signed   By: Lowella Grip III M.D.   On: 12/04/2017 18:59   Ct Angio Neck W Or Wo Contrast  Result Date: 12/04/2017 CLINICAL DATA:  Focal neuro deficit for greater than 6 hours. EXAM: CT ANGIOGRAPHY HEAD AND NECK CT PERFUSION BRAIN TECHNIQUE: Multidetector CT imaging of the head and neck was performed using the standard protocol during bolus administration of intravenous contrast. Multiplanar CT image reconstructions and MIPs were obtained to evaluate the vascular anatomy. Carotid stenosis measurements (when applicable) are obtained utilizing NASCET criteria, using the distal internal carotid diameter as the denominator. Multiphase CT imaging of the brain was performed following IV bolus contrast injection. Subsequent parametric perfusion maps were calculated using RAPID software. CONTRAST:  157mL ISOVUE-370 IOPAMIDOL (ISOVUE-370) INJECTION 76% COMPARISON:  CT head without contrast from the same day. FINDINGS: CTA NECK FINDINGS Aortic arch: A 3 vessel arch configuration is present. There is minimal calcification at the origin of the left subclavian artery. Additional calcifications are present in the distal arch without aneurysm or stenosis. Right carotid system: The right common carotid artery is within normal limits. Atherosclerotic calcifications are  present at the bifurcation. There is no significant stenosis. Cervical right ICA is within normal limits. Left carotid system: Is the left common carotid artery is within normal limits. Atherosclerotic changes are present at the bifurcation. There is no significant stenosis relative to the more distal vessel. The cervical left ICA is unremarkable. Vertebral arteries: The left vertebral artery is dominant to the right. Both vertebral arteries originate from the subclavian arteries without significant stenosis. There tortuosity in the proximal left vertebral artery. No stenoses are present in either vertebral artery throughout the neck. Skeleton: Grade 1 anterolisthesis is present at C3-4. There is chronic loss of disc height at C4-5, C5-6, and C6-7. Other neck: The soft tissues the neck are otherwise unremarkable. Salivary glands are within normal limits bilaterally. No focal mucosal or submucosal lesions are present. The thyroid is within normal limits. There is no significant adenopathy. Upper chest: Interlobular septal thickening is present. There is no focal consolidation. Review of the MIP images confirms the above findings CTA HEAD FINDINGS Anterior circulation: Atherosclerotic irregularity is present in the cavernous and precavernous internal carotid arteries bilaterally. There is no significant stenosis through the ICA termini. The right A1 segment is hypoplastic. Anterior communicating artery is patent. Both A2 segments fill. M1 segments are within normal limits. MCA bifurcations are normal. Is small vessel attenuation is present  in the MCA branch vessels bilaterally without a significant proximal stenosis or occlusion. Posterior circulation: The left vertebral artery is the dominant vessel. PICA origins are visualized and normal bilaterally. The basilar artery is within normal limits. Both posterior cerebral arteries originate from the basilar tip. The PCA branch vessels are within normal limits. Venous  sinuses: Dural sinuses are patent. The right transverse sinus is dominant. Straight sinus deep cerebral veins are intact. Cortical veins are unremarkable. Anatomic variants: None. Review of the MIP images confirms the above findings CT Brain Perfusion Findings: CBF (<30%) Volume: 24mL Perfusion (Tmax>6.0s) volume: 79mL A remote left occipital lobe infarct is noted. IMPRESSION: 1. Mild atherosclerotic changes at the origin of the left subclavian artery and at the carotid bifurcations bilaterally without significant stenosis relative to the more distal vessels. 2. Atherosclerotic irregularity within the cavernous and precavernous internal carotid arteries bilaterally without significant stenosis. 3. Distal small vessel disease in the MCA branch vessels bilaterally without significant proximal stenosis, aneurysm, or branch vessel occlusion. 4. Remote left occipital lobe infarct. 5. Perfusion imaging demonstrates no acute infarct or significant ischemia. 6. Spondylosis of the cervical spine as described. Electronically Signed   By: San Morelle M.D.   On: 12/04/2017 20:47   Ct Cerebral Perfusion W Contrast  Result Date: 12/04/2017 CLINICAL DATA:  Focal neuro deficit for greater than 6 hours. EXAM: CT ANGIOGRAPHY HEAD AND NECK CT PERFUSION BRAIN TECHNIQUE: Multidetector CT imaging of the head and neck was performed using the standard protocol during bolus administration of intravenous contrast. Multiplanar CT image reconstructions and MIPs were obtained to evaluate the vascular anatomy. Carotid stenosis measurements (when applicable) are obtained utilizing NASCET criteria, using the distal internal carotid diameter as the denominator. Multiphase CT imaging of the brain was performed following IV bolus contrast injection. Subsequent parametric perfusion maps were calculated using RAPID software. CONTRAST:  133mL ISOVUE-370 IOPAMIDOL (ISOVUE-370) INJECTION 76% COMPARISON:  CT head without contrast from the same  day. FINDINGS: CTA NECK FINDINGS Aortic arch: A 3 vessel arch configuration is present. There is minimal calcification at the origin of the left subclavian artery. Additional calcifications are present in the distal arch without aneurysm or stenosis. Right carotid system: The right common carotid artery is within normal limits. Atherosclerotic calcifications are present at the bifurcation. There is no significant stenosis. Cervical right ICA is within normal limits. Left carotid system: Is the left common carotid artery is within normal limits. Atherosclerotic changes are present at the bifurcation. There is no significant stenosis relative to the more distal vessel. The cervical left ICA is unremarkable. Vertebral arteries: The left vertebral artery is dominant to the right. Both vertebral arteries originate from the subclavian arteries without significant stenosis. There tortuosity in the proximal left vertebral artery. No stenoses are present in either vertebral artery throughout the neck. Skeleton: Grade 1 anterolisthesis is present at C3-4. There is chronic loss of disc height at C4-5, C5-6, and C6-7. Other neck: The soft tissues the neck are otherwise unremarkable. Salivary glands are within normal limits bilaterally. No focal mucosal or submucosal lesions are present. The thyroid is within normal limits. There is no significant adenopathy. Upper chest: Interlobular septal thickening is present. There is no focal consolidation. Review of the MIP images confirms the above findings CTA HEAD FINDINGS Anterior circulation: Atherosclerotic irregularity is present in the cavernous and precavernous internal carotid arteries bilaterally. There is no significant stenosis through the ICA termini. The right A1 segment is hypoplastic. Anterior communicating artery is patent. Both A2 segments  fill. M1 segments are within normal limits. MCA bifurcations are normal. Is small vessel attenuation is present in the MCA branch  vessels bilaterally without a significant proximal stenosis or occlusion. Posterior circulation: The left vertebral artery is the dominant vessel. PICA origins are visualized and normal bilaterally. The basilar artery is within normal limits. Both posterior cerebral arteries originate from the basilar tip. The PCA branch vessels are within normal limits. Venous sinuses: Dural sinuses are patent. The right transverse sinus is dominant. Straight sinus deep cerebral veins are intact. Cortical veins are unremarkable. Anatomic variants: None. Review of the MIP images confirms the above findings CT Brain Perfusion Findings: CBF (<30%) Volume: 68mL Perfusion (Tmax>6.0s) volume: 5mL A remote left occipital lobe infarct is noted. IMPRESSION: 1. Mild atherosclerotic changes at the origin of the left subclavian artery and at the carotid bifurcations bilaterally without significant stenosis relative to the more distal vessels. 2. Atherosclerotic irregularity within the cavernous and precavernous internal carotid arteries bilaterally without significant stenosis. 3. Distal small vessel disease in the MCA branch vessels bilaterally without significant proximal stenosis, aneurysm, or branch vessel occlusion. 4. Remote left occipital lobe infarct. 5. Perfusion imaging demonstrates no acute infarct or significant ischemia. 6. Spondylosis of the cervical spine as described. Electronically Signed   By: San Morelle M.D.   On: 12/04/2017 20:47   MRI pending  TTE pending   PHYSICAL EXAM  Temp:  [98.6 F (37 C)-99.1 F (37.3 C)] 99.1 F (37.3 C) (02/11 1015) Pulse Rate:  [73-102] 93 (02/11 1015) Resp:  [12-26] 16 (02/11 1015) BP: (126-187)/(60-106) 174/81 (02/11 1015) SpO2:  [86 %-100 %] 97 % (02/11 1015) Weight:  [125 lb (56.7 kg)] 125 lb (56.7 kg) (02/10 1820)  General - thin built, well developed, in no apparent distress, with mild restlessness.  Ophthalmologic - fundi not visualized due to  noncooperation.  Cardiovascular - Regular rate and rhythm with no murmur.  Mental Status -  Awake alert, orientated to first name only, not orientated to last name, age, place, people or time. Language exam showed spontaneous speech, able to follow one step command and able to repeat, however, profound anomia, not able to follow two-step commands, not able to differentiate left and right. Attention span and concentration exam showed not able to backward spelling and not able to calculate. Recent and remote memory were impaired with 0/3 delayed recall. Fund of Knowledge was assessed and was impaired.  Cranial Nerves II - XII - II - Visual field exam showed simultanagnosia but intermittently able to perceive 2-3 objects in the picture at the same time.  III, IV, VI - Extraocular movements intact with smooth pursuit eye movement but not able to perform spontaneous saccades, consistent with oculomotor apraxia. V - Facial sensation intact bilaterally. VII - Facial movement intact bilaterally. VIII - Hearing & vestibular intact bilaterally. X - Palate elevates symmetrically. XI - Chin turning & shoulder shrug intact bilaterally. XII - Tongue protrusion intact.  Motor Strength - The patient's strength was normal in all extremities and pronator drift was absent.  Bulk was normal and fasciculations were absent.   Motor Tone - Muscle tone was assessed at the neck and appendages and was normal.  Reflexes - The patient's reflexes were symmetrical in all extremities and she had no pathological reflexes.  Sensory - Light touch, temperature/pinprick were assessed and were symmetrical.    Coordination - The patient had dysmetria and miss pointing with FTN, not able to pick up spoon at side of visual field  but able to pick up knife at the center of visual field, consistent with optic ataxia and lack of visual scanning.  Tremor was absent.  Gait and Station - deferred.   ASSESSMENT/PLAN Ms. SIGRID SCHWEBACH is a 77 y.o. female with history of HTN, HLD, cognitive decline admitted after a fall. No tPA given due to not feeling to be stroke.    Cognitive decline with balint syndrome    Resultant anomia, impaired cognition, simultanagnosia, optic ataxia, and oculomotor apraxia  CT remote left occipital infarct  MRI  pending  CTA head and neck and CTP unremarkable  2D Echo  pending  LDL 114  HgbA1c 5.4  TSH and B12 WNL  lovenox for VTE prophylaxis  Fall precautions  Diet Heart Room service appropriate? Yes; Fluid consistency: Thin   No antithrombotic prior to admission, now on aspirin 81 mg daily.   Ongoing aggressive stroke risk factor management  Therapy recommendations:  CIR  Disposition:  Pending  Pt has neurology visit with Dr. Rexene Alberts on 12/13/17  Hypertension Stable  Long term BP goal normotensive  Hyperlipidemia  Home meds:  none   LDL 114, goal < 70  Now on pravastatin 20  Continue statin at discharge  Other Stroke Risk Factors  Advanced age  Other Active Problems  S/p fall  Hospital day # 0   Rosalin Hawking, MD PhD Stroke Neurology 12/05/2017 1:19 PM    To contact Stroke Continuity provider, please refer to [email or web address] After hours, contact General Neurology

## 2017-12-05 NOTE — Progress Notes (Signed)
PROGRESS NOTE    Tammy Arnold  XNT:[id number] DOB: 1941/06/18 DOA: 12/04/2017 PCP: Kelton Pillar, MD   Outpatient Specialists:    Brief Narrative:  Tammy Arnold is a 77 y.o. female with history of hypertension hyperlipidemia who was taken off her medications recently after patient had an allergic reaction last month patient performed purpuric spots on the skin which was confirmed by biopsy was brought to the ER after patient had a fall at home.  As per the patient's husband patient was last seen around 11:45 AM and on returning back home around 4 PM patient was found to be on the floor.  Patient was conscious.  Patient has poor memory and does not remember why she fell.  After which patient was helped and made to sit on the chair.  During which it was noted that patient had ataxic gait.  Patient not had any difficulty swallowing or speaking.  When patient's husband went to the room and come back she had a fall onto the floor.  Did not lose consciousness or hit her head.  Patient was brought to the ER.     Assessment & Plan:   Principal Problem:   TIA (transient ischemic attack) Active Problems:   Essential hypertension   HLD (hyperlipidemia)   Ataxia   TIA versus stroke vs Balint syndrome  -appreciate neurology consult -MRI brain pending - 2D echo -PT-- CIR -pravastatin -ASA -EEG  Hypertension - PRN IV hydralazine for systolic blood pressure more than 220 until stroke ruled out.  Hyperlipidemia  -pravastatin ordered  Dementia - RPR - B12 lower end of normal -folate  -TSH normal -has appointment at [facility] 12/13/17      DVT prophylaxis:  Lovenox   Code Status: Full Code   Family Communication:   Disposition Plan:     Consultants:   neuro   Subjective: C/o being cold  Objective: Vitals:   12/05/17 0025 12/05/17 0134 12/05/17 0556 12/05/17 1015  BP: (!) 127/106 (!) 164/72 (!) 141/90 (!) 174/81  Pulse: 79 79 91 93  Resp:  18 18 16   Temp:   98.6 F (37 C) 99 F (37.2 C) 99.1 F (37.3 C)  TempSrc:  Oral Oral Oral  SpO2: 99% 98% 98% 97%  Weight:      Height:       No intake or output data in the 24 hours ending 12/05/17 1237 Filed Weights   12/04/17 1820  Weight: 56.7 kg (125 lb)    Examination:  General exam: Appears calm and comfortable  Respiratory system: Clear to auscultation. Respiratory effort normal. Cardiovascular system: S1 & S2 heard, RRR. No JVD, murmurs, rubs, gallops or clicks. No pedal edema. Gastrointestinal system: Abdomen is nondistended, soft and nontender. No organomegaly or masses felt. Normal bowel sounds heard. Central nervous system: coordination impaired Extremities: Symmetric 5 x 5 power. Skin: No rashes, lesions or ulcers Psychiatry: cooperative    Data Reviewed: I have personally reviewed following labs and imaging studies  CBC: Recent Labs  Lab 12/04/17 1915 12/04/17 1935 12/04/17 2350 12/05/17 0729  WBC 8.7  --  7.1 6.3  NEUTROABS 6.2  --   --   --   HGB 11.6* 11.9* 11.6* 11.1*  HCT 35.3* 35.0* 35.0* 34.4*  MCV 85.7  --  85.0 85.6  PLT 266  --  257 967   Basic Metabolic Panel: Recent Labs  Lab 12/04/17 1915 12/04/17 1935 12/04/17 2350 12/05/17 0729  NA 139 140  --  139  K  3.3* 3.4*  --  3.2*  CL 106 105  --  108  CO2 23  --   --  22  GLUCOSE 105* 104*  --  88  BUN 10 12  --  5*  CREATININE 0.69 0.60 0.67 0.71  CALCIUM 9.1  --   --  8.8*   GFR: Estimated Creatinine Clearance: 52.5 mL/min (by C-G formula based on SCr of 0.71 mg/dL). Liver Function Tests: Recent Labs  Lab 12/04/17 1915 12/05/17 0729  AST 22 21  ALT 15 15  ALKPHOS 47 44  BILITOT 0.5 0.8  PROT 6.6 6.2*  ALBUMIN 3.6 3.3*   No results for input(s): LIPASE, AMYLASE in the last 168 hours. No results for input(s): AMMONIA in the last 168 hours. Coagulation Profile: Recent Labs  Lab 12/04/17 2020  INR 1.13   Cardiac Enzymes: No results for input(s): CKTOTAL, CKMB, CKMBINDEX, TROPONINI  in the last 168 hours. BNP (last 3 results) No results for input(s): PROBNP in the last 8760 hours. HbA1C: Recent Labs    12/04/17 2350  HGBA1C 5.4   CBG: Recent Labs  Lab 12/04/17 1900  GLUCAP 103*   Lipid Profile: Recent Labs    12/05/17 0729  CHOL 168  HDL 45  LDLCALC 114*  TRIG 46  CHOLHDL 3.7   Thyroid Function Tests: Recent Labs    12/05/17 0729  TSH 2.537   Anemia Panel: Recent Labs    12/05/17 0729  VITAMINB12 367   Urine analysis:    Component Value Date/Time   COLORURINE YELLOW 12/04/2017 1919   APPEARANCEUR CLEAR 12/04/2017 1919   LABSPEC 1.013 12/04/2017 1919   PHURINE 7.0 12/04/2017 1919   GLUCOSEU NEGATIVE 12/04/2017 1919   HGBUR MODERATE (A) 12/04/2017 1919   BILIRUBINUR NEGATIVE 12/04/2017 1919   KETONESUR 5 (A) 12/04/2017 1919   PROTEINUR NEGATIVE 12/04/2017 1919   UROBILINOGEN 0.2 08/25/2007 0835   NITRITE NEGATIVE 12/04/2017 1919   LEUKOCYTESUR NEGATIVE 12/04/2017 1919     )No results found for this or any previous visit (from the past 240 hour(s)).    Anti-infectives (From admission, onward)   None       Radiology Studies: Ct Angio Head W Or Wo Contrast  Result Date: 12/04/2017 CLINICAL DATA:  Focal neuro deficit for greater than 6 hours. EXAM: CT ANGIOGRAPHY HEAD AND NECK CT PERFUSION BRAIN TECHNIQUE: Multidetector CT imaging of the head and neck was performed using the standard protocol during bolus administration of intravenous contrast. Multiplanar CT image reconstructions and MIPs were obtained to evaluate the vascular anatomy. Carotid stenosis measurements (when applicable) are obtained utilizing NASCET criteria, using the distal internal carotid diameter as the denominator. Multiphase CT imaging of the brain was performed following IV bolus contrast injection. Subsequent parametric perfusion maps were calculated using RAPID software. CONTRAST:  165mL ISOVUE-370 IOPAMIDOL (ISOVUE-370) INJECTION 76% COMPARISON:  CT head  without contrast from the same day. FINDINGS: CTA NECK FINDINGS Aortic arch: A 3 vessel arch configuration is present. There is minimal calcification at the origin of the left subclavian artery. Additional calcifications are present in the distal arch without aneurysm or stenosis. Right carotid system: The right common carotid artery is within normal limits. Atherosclerotic calcifications are present at the bifurcation. There is no significant stenosis. Cervical right ICA is within normal limits. Left carotid system: Is the left common carotid artery is within normal limits. Atherosclerotic changes are present at the bifurcation. There is no significant stenosis relative to the more distal vessel. The cervical left ICA  is unremarkable. Vertebral arteries: The left vertebral artery is dominant to the right. Both vertebral arteries originate from the subclavian arteries without significant stenosis. There tortuosity in the proximal left vertebral artery. No stenoses are present in either vertebral artery throughout the neck. Skeleton: Grade 1 anterolisthesis is present at C3-4. There is chronic loss of disc height at C4-5, C5-6, and C6-7. Other neck: The soft tissues the neck are otherwise unremarkable. Salivary glands are within normal limits bilaterally. No focal mucosal or submucosal lesions are present. The thyroid is within normal limits. There is no significant adenopathy. Upper chest: Interlobular septal thickening is present. There is no focal consolidation. Review of the MIP images confirms the above findings CTA HEAD FINDINGS Anterior circulation: Atherosclerotic irregularity is present in the cavernous and precavernous internal carotid arteries bilaterally. There is no significant stenosis through the ICA termini. The right A1 segment is hypoplastic. Anterior communicating artery is patent. Both A2 segments fill. M1 segments are within normal limits. MCA bifurcations are normal. Is small vessel attenuation  is present in the MCA branch vessels bilaterally without a significant proximal stenosis or occlusion. Posterior circulation: The left vertebral artery is the dominant vessel. PICA origins are visualized and normal bilaterally. The basilar artery is within normal limits. Both posterior cerebral arteries originate from the basilar tip. The PCA branch vessels are within normal limits. Venous sinuses: Dural sinuses are patent. The right transverse sinus is dominant. Straight sinus deep cerebral veins are intact. Cortical veins are unremarkable. Anatomic variants: None. Review of the MIP images confirms the above findings CT Brain Perfusion Findings: CBF (<30%) Volume: 16mL Perfusion (Tmax>6.0s) volume: 60mL A remote left occipital lobe infarct is noted. IMPRESSION: 1. Mild atherosclerotic changes at the origin of the left subclavian artery and at the carotid bifurcations bilaterally without significant stenosis relative to the more distal vessels. 2. Atherosclerotic irregularity within the cavernous and precavernous internal carotid arteries bilaterally without significant stenosis. 3. Distal small vessel disease in the MCA branch vessels bilaterally without significant proximal stenosis, aneurysm, or branch vessel occlusion. 4. Remote left occipital lobe infarct. 5. Perfusion imaging demonstrates no acute infarct or significant ischemia. 6. Spondylosis of the cervical spine as described. Electronically Signed   By: San Morelle M.D.   On: 12/04/2017 20:47   Ct Head Wo Contrast  Result Date: 12/04/2017 CLINICAL DATA:  Pain following fall.  Expressive aphasia. EXAM: CT HEAD WITHOUT CONTRAST TECHNIQUE: Contiguous axial images were obtained from the base of the skull through the vertex without intravenous contrast. COMPARISON:  None. FINDINGS: Brain: There is moderate generalized ventricular enlargement. There is milder sulcal prominence diffusely. There is no appreciable intracranial mass, hemorrhage,  extra-axial fluid collection, midline shift. There is a small focus of 4 decreased attenuation in the mid left cerebellum which is rather ill-defined and is concerning for a small acute infarct in the mid left cerebellar hemisphere. Elsewhere, there is patchy small vessel disease in the centra semiovale bilaterally. There is small vessel disease in the posterior limb of each internal capsule. Vascular: No hyperdense vessel. There is calcification in each carotid siphon region. Skull: The bony calvarium appears intact. Sinuses/Orbits: There is mucosal thickening in several ethmoid air cells. There is slight mucosal thickening in the lateral right maxillary antrum. Other paranasal sinuses are clear. Orbits appear symmetric bilaterally except for apparent prior cataract surgery on the left. Other: Mastoid air cells are clear. There is debris in each external auditory canal. IMPRESSION: 1. Focal area of decreased attenuation in the mid left  cerebellum, concerning for recent and possibly acute infarct in this area. 2. Atrophy with ventricles somewhat larger in proportion in sulci. Question a degree of superimposed normal pressure hydrocephalus. 3. Patchy supratentorial small vessel disease. No mass or hemorrhage. 4.  There are foci of arterial vascular calcification. 5.  Mild paranasal sinus disease. Electronically Signed   By: Lowella Grip III M.D.   On: 12/04/2017 18:59   Ct Angio Neck W Or Wo Contrast  Result Date: 12/04/2017 CLINICAL DATA:  Focal neuro deficit for greater than 6 hours. EXAM: CT ANGIOGRAPHY HEAD AND NECK CT PERFUSION BRAIN TECHNIQUE: Multidetector CT imaging of the head and neck was performed using the standard protocol during bolus administration of intravenous contrast. Multiplanar CT image reconstructions and MIPs were obtained to evaluate the vascular anatomy. Carotid stenosis measurements (when applicable) are obtained utilizing NASCET criteria, using the distal internal carotid diameter  as the denominator. Multiphase CT imaging of the brain was performed following IV bolus contrast injection. Subsequent parametric perfusion maps were calculated using RAPID software. CONTRAST:  152mL ISOVUE-370 IOPAMIDOL (ISOVUE-370) INJECTION 76% COMPARISON:  CT head without contrast from the same day. FINDINGS: CTA NECK FINDINGS Aortic arch: A 3 vessel arch configuration is present. There is minimal calcification at the origin of the left subclavian artery. Additional calcifications are present in the distal arch without aneurysm or stenosis. Right carotid system: The right common carotid artery is within normal limits. Atherosclerotic calcifications are present at the bifurcation. There is no significant stenosis. Cervical right ICA is within normal limits. Left carotid system: Is the left common carotid artery is within normal limits. Atherosclerotic changes are present at the bifurcation. There is no significant stenosis relative to the more distal vessel. The cervical left ICA is unremarkable. Vertebral arteries: The left vertebral artery is dominant to the right. Both vertebral arteries originate from the subclavian arteries without significant stenosis. There tortuosity in the proximal left vertebral artery. No stenoses are present in either vertebral artery throughout the neck. Skeleton: Grade 1 anterolisthesis is present at C3-4. There is chronic loss of disc height at C4-5, C5-6, and C6-7. Other neck: The soft tissues the neck are otherwise unremarkable. Salivary glands are within normal limits bilaterally. No focal mucosal or submucosal lesions are present. The thyroid is within normal limits. There is no significant adenopathy. Upper chest: Interlobular septal thickening is present. There is no focal consolidation. Review of the MIP images confirms the above findings CTA HEAD FINDINGS Anterior circulation: Atherosclerotic irregularity is present in the cavernous and precavernous internal carotid  arteries bilaterally. There is no significant stenosis through the ICA termini. The right A1 segment is hypoplastic. Anterior communicating artery is patent. Both A2 segments fill. M1 segments are within normal limits. MCA bifurcations are normal. Is small vessel attenuation is present in the MCA branch vessels bilaterally without a significant proximal stenosis or occlusion. Posterior circulation: The left vertebral artery is the dominant vessel. PICA origins are visualized and normal bilaterally. The basilar artery is within normal limits. Both posterior cerebral arteries originate from the basilar tip. The PCA branch vessels are within normal limits. Venous sinuses: Dural sinuses are patent. The right transverse sinus is dominant. Straight sinus deep cerebral veins are intact. Cortical veins are unremarkable. Anatomic variants: None. Review of the MIP images confirms the above findings CT Brain Perfusion Findings: CBF (<30%) Volume: 37mL Perfusion (Tmax>6.0s) volume: 57mL A remote left occipital lobe infarct is noted. IMPRESSION: 1. Mild atherosclerotic changes at the origin of the left subclavian artery  and at the carotid bifurcations bilaterally without significant stenosis relative to the more distal vessels. 2. Atherosclerotic irregularity within the cavernous and precavernous internal carotid arteries bilaterally without significant stenosis. 3. Distal small vessel disease in the MCA branch vessels bilaterally without significant proximal stenosis, aneurysm, or branch vessel occlusion. 4. Remote left occipital lobe infarct. 5. Perfusion imaging demonstrates no acute infarct or significant ischemia. 6. Spondylosis of the cervical spine as described. Electronically Signed   By: San Morelle M.D.   On: 12/04/2017 20:47   Ct Cerebral Perfusion W Contrast  Result Date: 12/04/2017 CLINICAL DATA:  Focal neuro deficit for greater than 6 hours. EXAM: CT ANGIOGRAPHY HEAD AND NECK CT PERFUSION BRAIN  TECHNIQUE: Multidetector CT imaging of the head and neck was performed using the standard protocol during bolus administration of intravenous contrast. Multiplanar CT image reconstructions and MIPs were obtained to evaluate the vascular anatomy. Carotid stenosis measurements (when applicable) are obtained utilizing NASCET criteria, using the distal internal carotid diameter as the denominator. Multiphase CT imaging of the brain was performed following IV bolus contrast injection. Subsequent parametric perfusion maps were calculated using RAPID software. CONTRAST:  157mL ISOVUE-370 IOPAMIDOL (ISOVUE-370) INJECTION 76% COMPARISON:  CT head without contrast from the same day. FINDINGS: CTA NECK FINDINGS Aortic arch: A 3 vessel arch configuration is present. There is minimal calcification at the origin of the left subclavian artery. Additional calcifications are present in the distal arch without aneurysm or stenosis. Right carotid system: The right common carotid artery is within normal limits. Atherosclerotic calcifications are present at the bifurcation. There is no significant stenosis. Cervical right ICA is within normal limits. Left carotid system: Is the left common carotid artery is within normal limits. Atherosclerotic changes are present at the bifurcation. There is no significant stenosis relative to the more distal vessel. The cervical left ICA is unremarkable. Vertebral arteries: The left vertebral artery is dominant to the right. Both vertebral arteries originate from the subclavian arteries without significant stenosis. There tortuosity in the proximal left vertebral artery. No stenoses are present in either vertebral artery throughout the neck. Skeleton: Grade 1 anterolisthesis is present at C3-4. There is chronic loss of disc height at C4-5, C5-6, and C6-7. Other neck: The soft tissues the neck are otherwise unremarkable. Salivary glands are within normal limits bilaterally. No focal mucosal or  submucosal lesions are present. The thyroid is within normal limits. There is no significant adenopathy. Upper chest: Interlobular septal thickening is present. There is no focal consolidation. Review of the MIP images confirms the above findings CTA HEAD FINDINGS Anterior circulation: Atherosclerotic irregularity is present in the cavernous and precavernous internal carotid arteries bilaterally. There is no significant stenosis through the ICA termini. The right A1 segment is hypoplastic. Anterior communicating artery is patent. Both A2 segments fill. M1 segments are within normal limits. MCA bifurcations are normal. Is small vessel attenuation is present in the MCA branch vessels bilaterally without a significant proximal stenosis or occlusion. Posterior circulation: The left vertebral artery is the dominant vessel. PICA origins are visualized and normal bilaterally. The basilar artery is within normal limits. Both posterior cerebral arteries originate from the basilar tip. The PCA branch vessels are within normal limits. Venous sinuses: Dural sinuses are patent. The right transverse sinus is dominant. Straight sinus deep cerebral veins are intact. Cortical veins are unremarkable. Anatomic variants: None. Review of the MIP images confirms the above findings CT Brain Perfusion Findings: CBF (<30%) Volume: 65mL Perfusion (Tmax>6.0s) volume: 34mL A remote left occipital  lobe infarct is noted. IMPRESSION: 1. Mild atherosclerotic changes at the origin of the left subclavian artery and at the carotid bifurcations bilaterally without significant stenosis relative to the more distal vessels. 2. Atherosclerotic irregularity within the cavernous and precavernous internal carotid arteries bilaterally without significant stenosis. 3. Distal small vessel disease in the MCA branch vessels bilaterally without significant proximal stenosis, aneurysm, or branch vessel occlusion. 4. Remote left occipital lobe infarct. 5. Perfusion  imaging demonstrates no acute infarct or significant ischemia. 6. Spondylosis of the cervical spine as described. Electronically Signed   By: San Morelle M.D.   On: 12/04/2017 20:47        Scheduled Meds: . aspirin  300 mg Rectal Daily   Or  . aspirin  325 mg Oral Daily  . enoxaparin (LOVENOX) injection  40 mg Subcutaneous Q24H  . ofloxacin  1 drop Left Eye BID  . pravastatin  20 mg Oral q1800  . prednisoLONE acetate  1 drop Left Eye BID   Continuous Infusions: . sodium chloride 50 mL/hr at 12/04/17 2355     LOS: 0 days    Time spent: 35 min    Geradine Girt, DO [facility] Pager [phone]  If 7PM-7AM, please contact night-coverage [email or web address] Password [facility] 12/05/2017, 12:37 PM

## 2017-12-05 NOTE — Progress Notes (Signed)
  Echocardiogram 2D Echocardiogram has been performed.  Merrie Roof F 12/05/2017, 9:24 AM

## 2017-12-05 NOTE — Care Management Note (Signed)
Case Management Note  Patient Details  Name: Tammy Arnold MRN: [id number] Date of Birth: [birth year]  Subjective/Objective:   Pt in with TIA. He is from home with his spouse.                  Action/Plan: Recommendation is for CIR. CM following for d/c disposition.   Expected Discharge Date:                  Expected Discharge Plan:  [place]  In-House Referral:     Discharge planning Services  CM Consult  Post Acute Care Choice:    Choice offered to:     DME Arranged:    DME Agency:     HH Arranged:    [place] Agency:     Status of Service:  In process, will continue to follow  If discussed at Long Length of Stay Meetings, dates discussed:    Additional Comments:  Pollie Friar, RN 12/05/2017, 4:14 PM

## 2017-12-05 NOTE — Progress Notes (Signed)
Rehab Admissions Coordinator Note:  Patient was screened by Cleatrice Burke for appropriateness for an Inpatient Acute Rehab Consult per PT recommendation.  At this time, we are recommending Inpatient Rehab consult if pt would like to be considered for admit and pt inpatient statues. Please advise.  Cleatrice Burke 12/05/2017, 11:44 AM  I can be reached at [phone].

## 2017-12-06 ENCOUNTER — Inpatient Hospital Stay (HOSPITAL_COMMUNITY): Payer: Medicare Other

## 2017-12-06 LAB — FOLATE RBC
FOLATE, HEMOLYSATE: 296.8 ng/mL
Folate, RBC: 873 ng/mL (ref >498)
HEMATOCRIT: 34 % (ref 34.0–46.6)

## 2017-12-06 MED ORDER — LIDOCAINE 5 % EX PTCH
1.0000 | MEDICATED_PATCH | CUTANEOUS | Status: DC
  Administered 2017-12-06: 1 via TRANSDERMAL
  Filled 2017-12-06: qty 1

## 2017-12-06 MED ORDER — VITAMIN B-12 100 MCG PO TABS
250.0000 ug | ORAL_TABLET | Freq: Every day | ORAL | Status: DC
  Administered 2017-12-06 – 2017-12-07 (×2): 250 ug via ORAL
  Filled 2017-12-06 (×2): qty 3

## 2017-12-06 NOTE — Progress Notes (Signed)
Occupational Therapy Treatment Patient Details Name: Tammy Arnold MRN: [id number] DOB: 1941-02-04 Today's Date: 12/06/2017    History of present illness Patient is a 77 y/o female who presents 12/04/17 s/p fall and with aphasia. Per spouse, pt with cognitive decline over the last year as well as memory issues. Head CT/CTA unremarkable. 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 MRI negative for acute abnormality; shows generalized volume loss and sequelae of chornic ischemic microangiopathy. Workup pending. PMH includes HTN, arthritis, cataract extraction (10/2017).   OT comments  Pt seen x 2 by OT this date.  She indicated severe LBP, with periods of restlessness and grimacing noted.  During periods of obvious discomfort, pt's ability to maintain attention to tasks at hand deteriorated significantly.  She required max A to bathe today due to impaired attention, perseveration, pain, and visual deficits.  She was able to perform grooming and toileting with min A today, which is an improvement compared to yesterday.  She required min A for the most part for functional transfers, however, she occasionally experienced LOB requiring mod A to recover.  Kinesiology tape applied to low back to alleviate pain - spouse instructed on its use.  It is very difficult to determine if there was a reduction in pain as she demonstrates significant difficulty verbalizing needs.   Spouse was present and is very supportive of patient.  He assisted several times throughout session, but at times over stimulates her verbally and with too much tactile input.  Based on this session, continue to feel she will benefit from CIR to allow her to maximize her independence and reduce burden of care - she does appear to be able to some instruction when info is provided in simple commands/phrases and tactile input to guide her to visually fixate on task or objects.     Follow Up Recommendations  CIR;Supervision/Assistance - 24 hour     Equipment Recommendations  3 in 1 bedside commode    Recommendations for Other Services      Precautions / Restrictions Precautions Precautions: Fall       Mobility Bed Mobility Overal bed mobility: Needs Assistance Bed Mobility: Sidelying to Sit;Sit to Supine   Sidelying to sit: Min assist   Sit to supine: Mod assist   General bed mobility comments: Pt indicating severe back pain in sidelying.  She was assisted to EOB, but she demonstrated difficulty problem solving through activity due to distraction from pain.  Assist provided to guide LEs off the bed and to lift his shoulders   Transfers Overall transfer level: Needs assistance Equipment used: 1 person hand held assist Transfers: Sit to/from Stand;Stand Pivot Transfers Sit to Stand: Min assist Stand pivot transfers: Min assist       General transfer comment: assist to steady     Balance Overall balance assessment: Needs assistance Sitting-balance support: Feet supported;Bilateral upper extremity supported Sitting balance-Leahy Scale: Fair Sitting balance - Comments: Pt able to sit EOB and EOC with min guard assist.  She was able to reach down to feet to wash and don socks without LOB    Standing balance support: Single extremity supported Standing balance-Leahy Scale: Poor Standing balance comment: requires min A and UE support with an occasional LOB to the Rt requiring mod A to recover                            ADL either performed or assessed with clinical judgement  ADL Overall ADL's : Needs assistance/impaired     Grooming: Wash/dry hands;Wash/dry face;Minimal assistance;Standing Grooming Details (indicate cue type and reason): Pt required tactile cues/assist to initiate washing hands and to move through the sequence of doing so, but was able to do so with multimodal cues, and occasional assist      Lower Body Bathing: Maximal assistance;Sit to/from stand Lower Body Bathing Details  (indicate cue type and reason): Pt's IV dislodged when she was moving OOB.  Pt with signifcant bleeding, and assisted with clean up.  Pt demonstrated difficulty targeting specific area to bathe.  She required tactile cues to bathe area, and noted to perseverate on washing her feet.  Pt with back pain and fatigue which further limited her ability to concentrate on task  Upper Body Dressing : Moderate assistance;Sitting Upper Body Dressing Details (indicate cue type and reason): Pt requires mod cues to target sleeve and thread UEs through sleeve  Lower Body Dressing: Minimal assistance Lower Body Dressing Details (indicate cue type and reason): performed socks only - she required min A to don Lt sock - appeared limited by back pain  Toilet Transfer: Minimal assistance;Ambulation;Comfort height toilet;Grab bars Toilet Transfer Details (indicate cue type and reason): Pt ambulated to BR with min HHA to help her navigate unfamiliar environment and for balance  Toileting- Clothing Manipulation and Hygiene: Minimal assistance;Sit to/from stand Toileting - Clothing Manipulation Details (indicate cue type and reason): Pt able to maneuver gown with min A for balance.  Pt began to clean peri area, then proceeded to reach to floor and wipe floor.  tactile and verbal cues to redirect behavior, then pt able to complete task successfully      Functional mobility during ADLs: Minimal assistance;Moderate assistance       Vision   Additional Comments: Pt sporadically able to better target objects today during functional ADL tasks    Perception     Praxis      Cognition Arousal/Alertness: Awake/alert Behavior During Therapy: Restless Overall Cognitive Status: Impaired/Different from baseline Area of Impairment: Orientation;Attention;Memory;Following commands;Safety/judgement;Awareness;Problem solving                 Orientation Level: Disoriented to;Time;Place Current Attention Level:  Sustained Memory: Decreased short-term memory Following Commands: Follows one step commands consistently Safety/Judgement: Decreased awareness of safety;Decreased awareness of deficits   Problem Solving: Decreased initiation;Difficulty sequencing;Requires verbal cues;Requires tactile cues General Comments: as pt fatigues and pain increases cognition declines.  Pt very restless today with c/o significant back pain which appears to significantly hinder her ability to consistently sustain attention to tasks - affect different than yesterday         Exercises Exercises: Other exercises Other Exercises Other Exercises: Kinesiology tape applied to low back to reduce pain.  Spouse was instructed in it's use.  Pt with difficulty expressing her needs and pain.  She will definitively indicate and say she is in pain, then soon after deny she is in pain, but she appears very restless with facial grimacing noted    Shoulder Instructions       General Comments Spouse present.  He reports pt has had gradual decline in cognition x ~ 2 years, but was still very independent until a more rapid decline since the first of the year.  She was still performing ADLs mod I up until admission, and was still driving until ~4 weeks PTA     Pertinent Vitals/ Pain       Pain Assessment: Faces Faces Pain Scale: Hurts  whole lot Pain Location: Lower back Pain Descriptors / Indicators: Grimacing;Guarding Pain Intervention(s): Monitored during session;Repositioned;Other (comment)(kinesiology tape applied to low back )  Home Living                                          Prior Functioning/Environment              Frequency  Min 3X/week        Progress Toward Goals  OT Goals(current goals can now be found in the care plan section)  Progress towards OT goals: Progressing toward goals     Plan Discharge plan remains appropriate    Co-evaluation                 AM-PAC PT "6  Clicks" Daily Activity     Outcome Measure   Help from another person eating meals?: A Little Help from another person taking care of personal grooming?: A Lot Help from another person toileting, which includes using toliet, bedpan, or urinal?: A Little Help from another person bathing (including washing, rinsing, drying)?: A Lot Help from another person to put on and taking off regular upper body clothing?: A Lot Help from another person to put on and taking off regular lower body clothing?: A Lot 6 Click Score: 14    End of Session    OT Visit Diagnosis: Unsteadiness on feet (R26.81);Pain Symptoms and signs involving cognitive functions: Other cerebrovascular disease Pain - part of body: (back )   Activity Tolerance Patient limited by fatigue;Patient limited by pain   Patient Left in bed;with bed alarm set;with family/visitor present   Nurse Communication Mobility status        Time: 1334-1400 OT Time Calculation (min): 26 min  Charges: OT General Charges $OT Visit: 1 Visit OT Treatments $Self Care/Home Management : 53-67 mins $Therapeutic Activity: 23-37 mins  [facility], OTR/L [phone]    Lucille Passy M 12/06/2017, 7:40 PM

## 2017-12-06 NOTE — Progress Notes (Signed)
  Speech Language Pathology Treatment: Cognitive-Linquistic  Patient Details Name: Tammy Arnold MRN: [id number] DOB: 11-01-1940 Today's Date: 12/06/2017 Time: 1035-1100 SLP Time Calculation (min) (ACUTE ONLY): 25 min  Assessment / Plan / Recommendation Clinical Impression  Skilled treatment session focused on cognition goals. SLP facilitated session by providing Total A assist to answer basic yes/no questions about family to achieve < 10% accuracy. Pt require multiple attempts with Total A to indicate any conditioning to simple task that didn't involve her speaking or visual perceptional abilities. Pt unable to repeat name of month or day of week d/t lack of understanding of task not anomia.    HPI HPI: Tammy Arnold a 77 y.o.femalewithhistory of hypertension hyperlipidemia who was taken off her medications recently after patient had an allergic reaction last month patient performed purpuric spots on the skin which was confirmed by biopsy was brought to the ER after patient had a fall at home. As per the patient's husband patient was last seen around 11:45 AM and on returning back home around 4 PM patient was found to be on the floor. Patient was conscious. Patient has poor memory and does not remember why she fell. After which patient was helped and made to sit on the chair. During which it was noted that patient had ataxic gait. Patient not had any difficulty swallowing or speaking. When patient's husband went to the room and come back she had a fall onto the floor. Did not lose consciousness or hit her head. Patient was brought to the ER. In the ER patient had CT head followed by CT angiogram of the head and neck which all were unremarkable. Patient is being admitted for further management of possible TIA versus stroke. On exam patient is able to move all extremities without difficulty. Pupils are equal and reactive to light. Patient passed swallow. MRI reveals generalized volume  loss and sequelae of chronic ischemic. microangiopathy without acute intracranial abnormality. Most recent neurology note (12/05/17) states that pt continues to present with cognitive impairment with visual disturbance, consistent with balint syndrome.      SLP Plan  Continue with current plan of care  Patient needs continued Speech Lanaguage Pathology Services    Recommendations                   Follow up Recommendations: Skilled Nursing facility SLP Visit Diagnosis: Cognitive communication deficit (J44.920) Plan: Continue with current plan of care       GO                Damon Baisch 12/06/2017, 12:16 PM

## 2017-12-06 NOTE — Progress Notes (Signed)
STROKE TEAM PROGRESS NOTE   SUBJECTIVE (INTERVAL HISTORY) Husband at bedside.  Overall she feels her condition is unchanged. 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 Imaging results and discharge POC reviewed.   As per husband who was physician specialized in [place], that pt started to have cognitive impairment 2-3 years ago and progressed over the years. However, she came in this time due to gait imbalance and easy to fall for the last 1-2 weeks. MRI showed no infarct. She has complained of severe back pain and currently working with PT/OT.   OBJECTIVE Temp:  [97.8 F (36.6 C)-99.4 F (37.4 C)] 99.4 F (37.4 C) (02/12 1015) Pulse Rate:  [62-82] 82 (02/12 1015) Cardiac Rhythm: Normal sinus rhythm (02/12 0700) Resp:  [18-20] 20 (02/12 1015) BP: (145-177)/(60-92) 160/81 (02/12 1015) SpO2:  [97 %-100 %] 100 % (02/12 1015)  Recent Labs  Lab 12/04/17 1900  GLUCAP 103*   Recent Labs  Lab 12/04/17 1915 12/04/17 1935 12/04/17 2350 12/05/17 0729  NA 139 140  --  139  K 3.3* 3.4*  --  3.2*  CL 106 105  --  108  CO2 23  --   --  22  GLUCOSE 105* 104*  --  88  BUN 10 12  --  5*  CREATININE 0.69 0.60 0.67 0.71  CALCIUM 9.1  --   --  8.8*   Recent Labs  Lab 12/04/17 1915 12/05/17 0729  AST 22 21  ALT 15 15  ALKPHOS 47 44  BILITOT 0.5 0.8  PROT 6.6 6.2*  ALBUMIN 3.6 3.3*   Recent Labs  Lab 12/04/17 1915 12/04/17 1935 12/04/17 2350 12/05/17 0729  WBC 8.7  --  7.1 6.3  NEUTROABS 6.2  --   --   --   HGB 11.6* 11.9* 11.6* 11.1*  HCT 35.3* 35.0* 35.0* 34.4*  MCV 85.7  --  85.0 85.6  PLT 266  --  257 247   No results for input(s): CKTOTAL, CKMB, CKMBINDEX, TROPONINI in the last 168 hours. Recent Labs    12/04/17 2020  LABPROT 14.4  INR 1.13   Recent Labs    12/04/17 1919  COLORURINE YELLOW  LABSPEC 1.013  PHURINE 7.0  GLUCOSEU NEGATIVE  HGBUR MODERATE*  BILIRUBINUR NEGATIVE  KETONESUR 5*   PROTEINUR NEGATIVE  NITRITE NEGATIVE  LEUKOCYTESUR NEGATIVE       Component Value Date/Time   CHOL 168 12/05/2017 0729   TRIG 46 12/05/2017 0729   HDL 45 12/05/2017 0729   CHOLHDL 3.7 12/05/2017 0729   VLDL 9 12/05/2017 0729   LDLCALC 114 (H) 12/05/2017 0729   Lab Results  Component Value Date   HGBA1C 5.4 12/04/2017      Component Value Date/Time   LABOPIA NONE DETECTED 12/04/2017 1919   COCAINSCRNUR NONE DETECTED 12/04/2017 1919   LABBENZ NONE DETECTED 12/04/2017 1919   AMPHETMU NONE DETECTED 12/04/2017 1919   THCU NONE DETECTED 12/04/2017 1919   LABBARB NONE DETECTED 12/04/2017 1919    Recent Labs  Lab 12/04/17 1919  ETH <10    I have personally reviewed the radiological images below and agree with the radiology interpretations.  Ct Angio Head W Or Wo Contrast  Result Date: 12/04/2017 CLINICAL DATA:  Focal neuro deficit for greater than 6 hours. EXAM: CT ANGIOGRAPHY HEAD AND NECK CT PERFUSION BRAIN TECHNIQUE: Multidetector CT imaging of the head and neck was performed using the standard protocol during bolus administration of intravenous contrast. Multiplanar CT  image reconstructions and MIPs were obtained to evaluate the vascular anatomy. Carotid stenosis measurements (when applicable) are obtained utilizing NASCET criteria, using the distal internal carotid diameter as the denominator. Multiphase CT imaging of the brain was performed following IV bolus contrast injection. Subsequent parametric perfusion maps were calculated using RAPID software. CONTRAST:  130mL ISOVUE-370 IOPAMIDOL (ISOVUE-370) INJECTION 76% COMPARISON:  CT head without contrast from the same day. FINDINGS: CTA NECK FINDINGS Aortic arch: A 3 vessel arch configuration is present. There is minimal calcification at the origin of the left subclavian artery. Additional calcifications are present in the distal arch without aneurysm or stenosis. Right carotid system: The right common carotid artery is within  normal limits. Atherosclerotic calcifications are present at the bifurcation. There is no significant stenosis. Cervical right ICA is within normal limits. Left carotid system: Is the left common carotid artery is within normal limits. Atherosclerotic changes are present at the bifurcation. There is no significant stenosis relative to the more distal vessel. The cervical left ICA is unremarkable. Vertebral arteries: The left vertebral artery is dominant to the right. Both vertebral arteries originate from the subclavian arteries without significant stenosis. There tortuosity in the proximal left vertebral artery. No stenoses are present in either vertebral artery throughout the neck. Skeleton: Grade 1 anterolisthesis is present at C3-4. There is chronic loss of disc height at C4-5, C5-6, and C6-7. Other neck: The soft tissues the neck are otherwise unremarkable. Salivary glands are within normal limits bilaterally. No focal mucosal or submucosal lesions are present. The thyroid is within normal limits. There is no significant adenopathy. Upper chest: Interlobular septal thickening is present. There is no focal consolidation. Review of the MIP images confirms the above findings CTA HEAD FINDINGS Anterior circulation: Atherosclerotic irregularity is present in the cavernous and precavernous internal carotid arteries bilaterally. There is no significant stenosis through the ICA termini. The right A1 segment is hypoplastic. Anterior communicating artery is patent. Both A2 segments fill. M1 segments are within normal limits. MCA bifurcations are normal. Is small vessel attenuation is present in the MCA branch vessels bilaterally without a significant proximal stenosis or occlusion. Posterior circulation: The left vertebral artery is the dominant vessel. PICA origins are visualized and normal bilaterally. The basilar artery is within normal limits. Both posterior cerebral arteries originate from the basilar tip. The PCA  branch vessels are within normal limits. Venous sinuses: Dural sinuses are patent. The right transverse sinus is dominant. Straight sinus deep cerebral veins are intact. Cortical veins are unremarkable. Anatomic variants: None. Review of the MIP images confirms the above findings CT Brain Perfusion Findings: CBF (<30%) Volume: 76mL Perfusion (Tmax>6.0s) volume: 74mL A remote left occipital lobe infarct is noted. IMPRESSION: 1. Mild atherosclerotic changes at the origin of the left subclavian artery and at the carotid bifurcations bilaterally without significant stenosis relative to the more distal vessels. 2. Atherosclerotic irregularity within the cavernous and precavernous internal carotid arteries bilaterally without significant stenosis. 3. Distal small vessel disease in the MCA branch vessels bilaterally without significant proximal stenosis, aneurysm, or branch vessel occlusion. 4. Remote left occipital lobe infarct. 5. Perfusion imaging demonstrates no acute infarct or significant ischemia. 6. Spondylosis of the cervical spine as described. Electronically Signed   By: San Morelle M.D.   On: 12/04/2017 20:47   Ct Head Wo Contrast  Result Date: 12/04/2017 CLINICAL DATA:  Pain following fall.  Expressive aphasia. EXAM: CT HEAD WITHOUT CONTRAST TECHNIQUE: Contiguous axial images were obtained from the base of the skull through  the vertex without intravenous contrast. COMPARISON:  None. FINDINGS: Brain: There is moderate generalized ventricular enlargement. There is milder sulcal prominence diffusely. There is no appreciable intracranial mass, hemorrhage, extra-axial fluid collection, midline shift. There is a small focus of 4 decreased attenuation in the mid left cerebellum which is rather ill-defined and is concerning for a small acute infarct in the mid left cerebellar hemisphere. Elsewhere, there is patchy small vessel disease in the centra semiovale bilaterally. There is small vessel disease in the  posterior limb of each internal capsule. Vascular: No hyperdense vessel. There is calcification in each carotid siphon region. Skull: The bony calvarium appears intact. Sinuses/Orbits: There is mucosal thickening in several ethmoid air cells. There is slight mucosal thickening in the lateral right maxillary antrum. Other paranasal sinuses are clear. Orbits appear symmetric bilaterally except for apparent prior cataract surgery on the left. Other: Mastoid air cells are clear. There is debris in each external auditory canal. IMPRESSION: 1. Focal area of decreased attenuation in the mid left cerebellum, concerning for recent and possibly acute infarct in this area. 2. Atrophy with ventricles somewhat larger in proportion in sulci. Question a degree of superimposed normal pressure hydrocephalus. 3. Patchy supratentorial small vessel disease. No mass or hemorrhage. 4.  There are foci of arterial vascular calcification. 5.  Mild paranasal sinus disease. Electronically Signed   By: Lowella Grip III M.D.   On: 12/04/2017 18:59   Ct Angio Neck W Or Wo Contrast  Result Date: 12/04/2017 CLINICAL DATA:  Focal neuro deficit for greater than 6 hours. EXAM: CT ANGIOGRAPHY HEAD AND NECK CT PERFUSION BRAIN TECHNIQUE: Multidetector CT imaging of the head and neck was performed using the standard protocol during bolus administration of intravenous contrast. Multiplanar CT image reconstructions and MIPs were obtained to evaluate the vascular anatomy. Carotid stenosis measurements (when applicable) are obtained utilizing NASCET criteria, using the distal internal carotid diameter as the denominator. Multiphase CT imaging of the brain was performed following IV bolus contrast injection. Subsequent parametric perfusion maps were calculated using RAPID software. CONTRAST:  118mL ISOVUE-370 IOPAMIDOL (ISOVUE-370) INJECTION 76% COMPARISON:  CT head without contrast from the same day. FINDINGS: CTA NECK FINDINGS Aortic arch: A 3  vessel arch configuration is present. There is minimal calcification at the origin of the left subclavian artery. Additional calcifications are present in the distal arch without aneurysm or stenosis. Right carotid system: The right common carotid artery is within normal limits. Atherosclerotic calcifications are present at the bifurcation. There is no significant stenosis. Cervical right ICA is within normal limits. Left carotid system: Is the left common carotid artery is within normal limits. Atherosclerotic changes are present at the bifurcation. There is no significant stenosis relative to the more distal vessel. The cervical left ICA is unremarkable. Vertebral arteries: The left vertebral artery is dominant to the right. Both vertebral arteries originate from the subclavian arteries without significant stenosis. There tortuosity in the proximal left vertebral artery. No stenoses are present in either vertebral artery throughout the neck. Skeleton: Grade 1 anterolisthesis is present at C3-4. There is chronic loss of disc height at C4-5, C5-6, and C6-7. Other neck: The soft tissues the neck are otherwise unremarkable. Salivary glands are within normal limits bilaterally. No focal mucosal or submucosal lesions are present. The thyroid is within normal limits. There is no significant adenopathy. Upper chest: Interlobular septal thickening is present. There is no focal consolidation. Review of the MIP images confirms the above findings CTA HEAD FINDINGS Anterior circulation: Atherosclerotic irregularity  is present in the cavernous and precavernous internal carotid arteries bilaterally. There is no significant stenosis through the ICA termini. The right A1 segment is hypoplastic. Anterior communicating artery is patent. Both A2 segments fill. M1 segments are within normal limits. MCA bifurcations are normal. Is small vessel attenuation is present in the MCA branch vessels bilaterally without a significant proximal  stenosis or occlusion. Posterior circulation: The left vertebral artery is the dominant vessel. PICA origins are visualized and normal bilaterally. The basilar artery is within normal limits. Both posterior cerebral arteries originate from the basilar tip. The PCA branch vessels are within normal limits. Venous sinuses: Dural sinuses are patent. The right transverse sinus is dominant. Straight sinus deep cerebral veins are intact. Cortical veins are unremarkable. Anatomic variants: None. Review of the MIP images confirms the above findings CT Brain Perfusion Findings: CBF (<30%) Volume: 91mL Perfusion (Tmax>6.0s) volume: 48mL A remote left occipital lobe infarct is noted. IMPRESSION: 1. Mild atherosclerotic changes at the origin of the left subclavian artery and at the carotid bifurcations bilaterally without significant stenosis relative to the more distal vessels. 2. Atherosclerotic irregularity within the cavernous and precavernous internal carotid arteries bilaterally without significant stenosis. 3. Distal small vessel disease in the MCA branch vessels bilaterally without significant proximal stenosis, aneurysm, or branch vessel occlusion. 4. Remote left occipital lobe infarct. 5. Perfusion imaging demonstrates no acute infarct or significant ischemia. 6. Spondylosis of the cervical spine as described. Electronically Signed   By: San Morelle M.D.   On: 12/04/2017 20:47   Mr Brain Wo Contrast  Result Date: 12/05/2017 CLINICAL DATA:  Fall with gait abnormality.  Expressive aphasia. EXAM: MRI HEAD WITHOUT CONTRAST TECHNIQUE: Multiplanar, multiecho pulse sequences of the brain and surrounding structures were obtained without intravenous contrast. COMPARISON:  Head CT 12/04/2017 FINDINGS: Brain: The midline structures are normal. There is no acute infarct or acute hemorrhage. No mass lesion, hydrocephalus, dural abnormality or extra-axial collection. There is periventricular white matter hyperintensity  consistent with chronic small vessel disease. Generalized volume loss with ex vacuo dilatation of the ventricles. No chronic microhemorrhage or superficial siderosis. Vascular: Major intracranial arterial and venous sinus flow voids are preserved. Skull and upper cervical spine: The visualized skull base, calvarium, upper cervical spine and extracranial soft tissues are normal. Sinuses/Orbits: No fluid levels or advanced mucosal thickening. No mastoid or middle ear effusion. Normal orbits. IMPRESSION: Generalized volume loss and sequelae of chronic ischemic microangiopathy without acute intracranial abnormality. Electronically Signed   By: Ulyses Jarred M.D.   On: 12/05/2017 20:00   Ct Cerebral Perfusion W Contrast  Result Date: 12/04/2017 CLINICAL DATA:  Focal neuro deficit for greater than 6 hours. EXAM: CT ANGIOGRAPHY HEAD AND NECK CT PERFUSION BRAIN TECHNIQUE: Multidetector CT imaging of the head and neck was performed using the standard protocol during bolus administration of intravenous contrast. Multiplanar CT image reconstructions and MIPs were obtained to evaluate the vascular anatomy. Carotid stenosis measurements (when applicable) are obtained utilizing NASCET criteria, using the distal internal carotid diameter as the denominator. Multiphase CT imaging of the brain was performed following IV bolus contrast injection. Subsequent parametric perfusion maps were calculated using RAPID software. CONTRAST:  164mL ISOVUE-370 IOPAMIDOL (ISOVUE-370) INJECTION 76% COMPARISON:  CT head without contrast from the same day. FINDINGS: CTA NECK FINDINGS Aortic arch: A 3 vessel arch configuration is present. There is minimal calcification at the origin of the left subclavian artery. Additional calcifications are present in the distal arch without aneurysm or stenosis. Right carotid system: The  right common carotid artery is within normal limits. Atherosclerotic calcifications are present at the bifurcation. There is  no significant stenosis. Cervical right ICA is within normal limits. Left carotid system: Is the left common carotid artery is within normal limits. Atherosclerotic changes are present at the bifurcation. There is no significant stenosis relative to the more distal vessel. The cervical left ICA is unremarkable. Vertebral arteries: The left vertebral artery is dominant to the right. Both vertebral arteries originate from the subclavian arteries without significant stenosis. There tortuosity in the proximal left vertebral artery. No stenoses are present in either vertebral artery throughout the neck. Skeleton: Grade 1 anterolisthesis is present at C3-4. There is chronic loss of disc height at C4-5, C5-6, and C6-7. Other neck: The soft tissues the neck are otherwise unremarkable. Salivary glands are within normal limits bilaterally. No focal mucosal or submucosal lesions are present. The thyroid is within normal limits. There is no significant adenopathy. Upper chest: Interlobular septal thickening is present. There is no focal consolidation. Review of the MIP images confirms the above findings CTA HEAD FINDINGS Anterior circulation: Atherosclerotic irregularity is present in the cavernous and precavernous internal carotid arteries bilaterally. There is no significant stenosis through the ICA termini. The right A1 segment is hypoplastic. Anterior communicating artery is patent. Both A2 segments fill. M1 segments are within normal limits. MCA bifurcations are normal. Is small vessel attenuation is present in the MCA branch vessels bilaterally without a significant proximal stenosis or occlusion. Posterior circulation: The left vertebral artery is the dominant vessel. PICA origins are visualized and normal bilaterally. The basilar artery is within normal limits. Both posterior cerebral arteries originate from the basilar tip. The PCA branch vessels are within normal limits. Venous sinuses: Dural sinuses are patent. The  right transverse sinus is dominant. Straight sinus deep cerebral veins are intact. Cortical veins are unremarkable. Anatomic variants: None. Review of the MIP images confirms the above findings CT Brain Perfusion Findings: CBF (<30%) Volume: 45mL Perfusion (Tmax>6.0s) volume: 14mL A remote left occipital lobe infarct is noted. IMPRESSION: 1. Mild atherosclerotic changes at the origin of the left subclavian artery and at the carotid bifurcations bilaterally without significant stenosis relative to the more distal vessels. 2. Atherosclerotic irregularity within the cavernous and precavernous internal carotid arteries bilaterally without significant stenosis. 3. Distal small vessel disease in the MCA branch vessels bilaterally without significant proximal stenosis, aneurysm, or branch vessel occlusion. 4. Remote left occipital lobe infarct. 5. Perfusion imaging demonstrates no acute infarct or significant ischemia. 6. Spondylosis of the cervical spine as described. Electronically Signed   By: San Morelle M.D.   On: 12/04/2017 20:47   MRI  IMPRESSION: Generalized volume loss and sequelae of chronic ischemic microangiopathy without acute intracranial abnormality  TTE  Study Conclusions - Left ventricle: The cavity size was normal. Wall thickness was   increased in a pattern of mild LVH. Systolic function was normal.   The estimated ejection fraction was in the range of 60% to 65%.   Wall motion was normal; there were no regional wall motion   abnormalities. Doppler parameters are consistent with abnormal   left ventricular relaxation (grade 1 diastolic dysfunction). - Aortic valve: There was no stenosis. - Mitral valve: Mildly calcified annulus. There was no significant   regurgitation. - Left atrium: The atrium was mildly to moderately dilated. - Right ventricle: The cavity size was normal. Systolic function   was normal. - Pulmonary arteries: No complete TR doppler jet so unable to  estimate PA systolic pressure. - Inferior vena cava: The vessel was normal in size. The   respirophasic diameter changes were in the normal range (>= 50%),   consistent with normal central venous pressure. Impressions:- Normal LV size with mild LV hypertrophy. EF 60-65%. Normal RV   size and systolic function. No significant valvular   abnormalities.  PHYSICAL EXAM  Temp:  [97.8 F (36.6 C)-99.4 F (37.4 C)] 99.4 F (37.4 C) (02/12 1015) Pulse Rate:  [62-82] 82 (02/12 1015) Resp:  [18-20] 20 (02/12 1015) BP: (145-177)/(60-92) 160/81 (02/12 1015) SpO2:  [97 %-100 %] 100 % (02/12 1015)  General - thin built, well developed, in no apparent distress, with mild restlessness.  Ophthalmologic - fundi not visualized due to noncooperation.  Cardiovascular - Regular rate and rhythm with no murmur.  Mental Status -  Awake alert, orientated to first name only, not orientated to last name, age, place, people or time. Language exam showed spontaneous speech, able to follow one step command and able to repeat, however, profound anomia, not able to follow two-step commands, not able to differentiate left and right. Attention span and concentration exam showed not able to backward spelling and not able to calculate. Recent and remote memory were impaired with 0/3 delayed recall. Fund of Knowledge was assessed and was impaired.  Cranial Nerves II - XII - II - Visual field exam showed simultanagnosia but intermittently able to perceive 2-3 objects in the picture at the same time.  III, IV, VI - Extraocular movements intact with smooth pursuit eye movement but not able to perform spontaneous saccades, consistent with oculomotor apraxia. V - Facial sensation intact bilaterally. VII - Facial movement intact bilaterally. VIII - Hearing & vestibular intact bilaterally. X - Palate elevates symmetrically. XI - Chin turning & shoulder shrug intact bilaterally. XII - Tongue protrusion intact.  Motor  Strength - The patient's strength was normal in all extremities and pronator drift was absent.  Bulk was normal and fasciculations were absent.   Motor Tone - Muscle tone was assessed at the neck and appendages and was normal.  Reflexes - The patient's reflexes were symmetrical in all extremities and she had no pathological reflexes.  Sensory - Light touch, temperature/pinprick were assessed and were symmetrical.    Coordination - The patient had dysmetria and miss pointing with FTN, not able to pick up spoon at side of visual field but able to pick up knife at the center of visual field, consistent with optic ataxia and lack of visual scanning.  Tremor was absent.  Gait and Station - deferred.   ASSESSMENT/PLAN Ms. Tammy Arnold is a 77 y.o. female with history of HTN, HLD, cognitive decline admitted after a fall. No tPA given due to not feeling to be stroke.    Cognitive decline with balint syndrome - progressed over the last 2-3 years   Resultant anomia, impaired cognition, simultanagnosia, optic ataxia, and oculomotor apraxia  CT remote left occipital infarct  MRI  - No acute findings  CTA head and neck and CTP unremarkable  2D Echo - EF 60-65%, No PFO   Do not feel LP or EEG needed at this time.   LDL 114  HgbA1c 5.4  TSH and B12 WNL  lovenox for VTE prophylaxis Fall precautions  Diet Heart Room service appropriate? Yes; Fluid consistency: Thin   No antithrombotic prior to admission, now on aspirin 81 mg daily.   Ongoing aggressive stroke risk factor management  Therapy recommendations:  CIR  Disposition:  CIR hopefully in AM  Pt has neurology visit with Dr. Rexene Alberts on 12/13/17  Hypertension Stable  Long term BP goal normotensive  Hyperlipidemia  Home meds:  none   LDL 114, goal < 70  Now on pravastatin 20  Continue statin at discharge  Other Stroke Risk Factors  Advanced age  Other Active Problems  S/p fall  Hospital day # 1  Neurology  to sign off. Please call for any further questions or concerns. Pt will follow up with Dr. Rexene Alberts as scheduled on 12/13/17. Thank you for this consultation.  Rosalin Hawking, MD PhD Stroke Neurology 12/06/2017 3:53 PM  To contact Stroke Continuity provider, please refer to [email or web address] After hours, contact General Neurology

## 2017-12-06 NOTE — Progress Notes (Addendum)
Physical Therapy Treatment Patient Details Name: Tammy Arnold MRN: [id number] DOB: September 08, 1941 Today's Date: 12/06/2017    History of Present Illness Patient is a 77 y/o female who presents 12/04/17 s/p fall and with aphasia. Per spouse, pt with cognitive decline over the last year as well as memory issues. Head CT/CTA unremarkable. 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 MRI negative for acute abnormality; shows generalized volume loss and sequelae of chornic ischemic microangiopathy. Workup pending. PMH includes HTN, arthritis, cataract extraction (10/2017).   PT Comments    Pt progressing with mobility. Demonstrates significant visual/spatial deficits throughout session, most likely contributing to her apparent balance deficits requiring HHA and modA to maintain balance while ambulating. Pt with decreased ability to follow commands, especially with regards to directional tasks and scanning room (I.e. Finding recliner to sit in). Pt with decreased awareness of deficits. Husband present this session; reports pt was indep with ambulation, not requiring any DME; husband provides intermittent assist for ADLs. Continue to recommend intensive CIR-level therapies to maximize functional mobility prior to return home; pt and husband in agreement with this. Will follow acutely.    Follow Up Recommendations  CIR     Equipment Recommendations  None recommended by PT    Recommendations for Other Services Rehab consult     Precautions / Restrictions Precautions Precautions: Fall Restrictions Weight Bearing Restrictions: No    Mobility  Bed Mobility Overal bed mobility: Needs Assistance Bed Mobility: Supine to Sit     Supine to sit: Min assist     General bed mobility comments: Entered room with pt laying on side secondary to lower back pain; atttempting to sit up despite bedrail in her way. MinA to maintain balance and keep from falling off edge, as pt leaning sideways, although no  physical assist required to actually come into sitting. MinA to scoot hips back to keep from sliding off bed  Transfers Overall transfer level: Needs assistance Equipment used: 1 person hand held assist Transfers: Sit to/from Stand Sit to Stand: Mod assist         General transfer comment: Pt able to stand on 3rd attempt with single HHA and modA to assist trunk elevation and maintain balance  Ambulation/Gait Ambulation/Gait assistance: Mod assist Ambulation Distance (Feet): 10 Feet Assistive device: 1 person hand held assist Gait Pattern/deviations: Step-to pattern;Narrow base of support;Scissoring Gait velocity: Decreased Gait velocity interpretation: <1.8 ft/sec, indicative of risk for recurrent falls General Gait Details: Slow, unsteady ambulation with HHA and modA to maintain balance; pt also reaching for bilat UE support on furniture. Pt with narrow BOS unable to self-correct, requiring cues. Told to sit in recliner, pt attempting to sit in chair next to husband; unable to scan in order to find recliner or follow directional tasks in order to get to it; required physical guidance to chair   Stairs            Wheelchair Mobility    Modified Rankin (Stroke Patients Only) Modified Rankin (Stroke Patients Only) Pre-Morbid Rankin Score: Moderate disability Modified Rankin: Moderately severe disability     Balance Overall balance assessment: Needs assistance Sitting-balance support: Feet supported;Bilateral upper extremity supported Sitting balance-Leahy Scale: Poor Sitting balance - Comments: Pt with decreased awareness about to slide forward off EOB. Unable to don L sock without almost falling off bed Postural control: Posterior lean Standing balance support: Single extremity supported Standing balance-Leahy Scale: Poor Standing balance comment: Reilant on external support for standing balance.  Cognition Arousal/Alertness:  Awake/alert Behavior During Therapy: Restless Overall Cognitive Status: Impaired/Different from baseline Area of Impairment: Orientation;Attention;Memory;Following commands;Safety/judgement;Awareness;Problem solving                 Orientation Level: Disoriented to;Time;Situation Current Attention Level: Sustained Memory: Decreased short-term memory Following Commands: Follows one step commands inconsistently;Follows multi-step commands inconsistently Safety/Judgement: Decreased awareness of deficits;Decreased awareness of safety Awareness: Intellectual Problem Solving: Slow processing;Difficulty sequencing;Requires verbal cues;Requires tactile cues General Comments: Pt following one-step commands >75% of times, but intermittent difficulty completing task correctly; difficult to determine whether this is related to deficits with problem solving or apraxias and visuo-spatial deficits. Decreased attention throughout session requiring intermittent cues to attend to task. Some aphasia noted talking about things not pertinent to conversation. Apparent visual-spatial deficits, unable to reach for object at midline or in periphery with either UE; pt inconsistently reporting awareness into her deficits      Exercises      General Comments General comments (skin integrity, edema, etc.): Husband present during session      Pertinent Vitals/Pain Pain Assessment: Faces Faces Pain Scale: Hurts little more Pain Location: Lower back Pain Descriptors / Indicators: Grimacing;Guarding Pain Intervention(s): Monitored during session;Repositioned    Home Living     Available Help at Discharge: Family;Available PRN/intermittently Type of Home: House              Prior Function            PT Goals (current goals can now be found in the care plan section) Acute Rehab PT Goals Patient Stated Goal: Get better PT Goal Formulation: With patient/family Time For Goal Achievement:  12/19/17 Potential to Achieve Goals: Good Progress towards PT goals: Progressing toward goals    Frequency    Min 3X/week      PT Plan Current plan remains appropriate    Co-evaluation              AM-PAC PT "6 Clicks" Daily Activity  Outcome Measure  Difficulty turning over in bed (including adjusting bedclothes, sheets and blankets)?: Unable Difficulty moving from lying on back to sitting on the side of the bed? : Unable Difficulty sitting down on and standing up from a chair with arms (e.g., wheelchair, bedside commode, etc,.)?: Unable Help needed moving to and from a bed to chair (including a wheelchair)?: A Little Help needed walking in hospital room?: A Lot Help needed climbing 3-5 steps with a railing? : Total 6 Click Score: 9    End of Session Equipment Utilized During Treatment: Gait belt Activity Tolerance: Patient tolerated treatment well Patient left: in chair;with call bell/phone within reach;with chair alarm set;with family/visitor present;Other (comment)(with MD present) Nurse Communication: Mobility status PT Visit Diagnosis: Unsteadiness on feet (R26.81);Difficulty in walking, not elsewhere classified (R26.2)     Time: 0272-5366 PT Time Calculation (min) (ACUTE ONLY): 27 min  Charges:  $Gait Training: 8-22 mins $Therapeutic Activity: 8-22 mins                    G Codes:      Mabeline Caras, PT, DPT Acute Rehab Services  Pager: [place] 12/06/2017, 10:32 AM

## 2017-12-06 NOTE — Progress Notes (Signed)
EEG completed, results pending. 

## 2017-12-06 NOTE — Progress Notes (Signed)
I met with pt with her spouse, Dr. Ulanda Edison, at bedside. He clarified baseline function pta. Up until Sunday, pt independent with adls and mobility. She has had a gradual decline cognitively over the past two years he relates to a baclofen dose she received in the ED. Played bridge three times per week until 6 months ago when she quit for she felt she was making too many mistakes. Drove until 3 to 4 weeks ago for she felt she was unsafe. Husband works as a [employer] representative 2 1/2 days per week. He states he can and will provide hired caregivers at home 24/7 and prefers an inpt rehab admit rather than SNF. He relates her physical decline sudden as of this past Sunday. I also discussed therapy progress with acute OT. I will discuss with Rehab MD. Await medical workup completion on acute. Hopeful for CIR admit. [phone]

## 2017-12-06 NOTE — Progress Notes (Signed)
PROGRESS NOTE    Tammy Arnold  RWE:[id number] DOB: Dec 22, 1940 DOA: 12/04/2017 PCP: Kelton Pillar, MD   Outpatient Specialists:    Brief Narrative:  Tammy Arnold is a 77 y.o. female with history of hypertension hyperlipidemia who was taken off her medications recently after patient had an allergic reaction last month patient performed purpuric spots on the skin which was confirmed by biopsy was brought to the ER after patient had a fall at home.  As per the patient's husband patient was last seen around 11:45 AM and on returning back home around 4 PM patient was found to be on the floor.  Patient was conscious.  Patient has poor memory and does not remember why she fell.  After which patient was helped and made to sit on the chair.  During which it was noted that patient had ataxic gait.  Patient not had any difficulty swallowing or speaking.  When patient's husband went to the room and come back she had a fall onto the floor.  Did not lose consciousness or hit her head.  Patient was brought to the ER.     Assessment & Plan:   Principal Problem:   TIA (transient ischemic attack) Active Problems:   Essential hypertension   HLD (hyperlipidemia)   Ataxia   Cognitive impairment   Balint's syndrome   Benign essential HTN   Abnormal MRI   Blurry vision   Hypokalemia   Acute blood loss anemia    Balint syndrome  -appreciate neurology consult -MRI brain w/o CVA but does show Generalized volume loss and sequelae of chronic ischemic microangiopathy without acute intracranial abnormality. - 2D echo: Normal LV size with mild LV hypertrophy. EF 60-65%. Normal RV   size and systolic function. No significant valvular   abnormalities. -PT-- CIR -pravastatin -ASA -EEG ordered  Hypertension - PRN IV hydralazine for systolic blood pressure more than 220 until stroke ruled out.  Hyperlipidemia  -pravastatin ordered  Dementia - RPR negative - B12 lower end of normal -folate    -TSH normal -has appointment at [facility] 12/13/17      DVT prophylaxis:  Lovenox   Code Status: Full Code   Family Communication: Husband (MD) at bedside  Disposition Plan:  CIR tomm?  Consultants:   neuro   Subjective: C/o low back pain Was recently on steroids of LE rash Had recent cataract surgery as well  Objective: Vitals:   12/05/17 2200 12/06/17 0200 12/06/17 0543 12/06/17 1015  BP: (!) 156/92 (!) 145/60 (!) 155/69 (!) 160/81  Pulse: 62 72 81 82  Resp: 20 18 18 20   Temp: 98.9 F (37.2 C) 98.2 F (36.8 C) 98 F (36.7 C) 99.4 F (37.4 C)  TempSrc: Oral Oral Axillary Oral  SpO2: 98% 99% 97% 100%  Weight:      Height:       No intake or output data in the 24 hours ending 12/06/17 1426 Filed Weights   12/04/17 1820  Weight: 56.7 kg (125 lb)    Examination:  General exam: in chair, moving around Respiratory system: clear, no wheezing Cardiovascular system: rrr Gastrointestinal system:+ BS, soft Central nervous system: impaired coordination Skin: no rash     Data Reviewed: I have personally reviewed following labs and imaging studies  CBC: Recent Labs  Lab 12/04/17 1915 12/04/17 1935 12/04/17 2350 12/05/17 0729  WBC 8.7  --  7.1 6.3  NEUTROABS 6.2  --   --   --   HGB 11.6* 11.9* 11.6* 11.1*  HCT 35.3* 35.0* 35.0* 34.4*  MCV 85.7  --  85.0 85.6  PLT 266  --  257 062   Basic Metabolic Panel: Recent Labs  Lab 12/04/17 1915 12/04/17 1935 12/04/17 2350 12/05/17 0729  NA 139 140  --  139  K 3.3* 3.4*  --  3.2*  CL 106 105  --  108  CO2 23  --   --  22  GLUCOSE 105* 104*  --  88  BUN 10 12  --  5*  CREATININE 0.69 0.60 0.67 0.71  CALCIUM 9.1  --   --  8.8*   GFR: Estimated Creatinine Clearance: 52.5 mL/min (by C-G formula based on SCr of 0.71 mg/dL). Liver Function Tests: Recent Labs  Lab 12/04/17 1915 12/05/17 0729  AST 22 21  ALT 15 15  ALKPHOS 47 44  BILITOT 0.5 0.8  PROT 6.6 6.2*  ALBUMIN 3.6 3.3*   No results for  input(s): LIPASE, AMYLASE in the last 168 hours. No results for input(s): AMMONIA in the last 168 hours. Coagulation Profile: Recent Labs  Lab 12/04/17 2020  INR 1.13   Cardiac Enzymes: No results for input(s): CKTOTAL, CKMB, CKMBINDEX, TROPONINI in the last 168 hours. BNP (last 3 results) No results for input(s): PROBNP in the last 8760 hours. HbA1C: Recent Labs    12/04/17 2350  HGBA1C 5.4   CBG: Recent Labs  Lab 12/04/17 1900  GLUCAP 103*   Lipid Profile: Recent Labs    12/05/17 0729  CHOL 168  HDL 45  LDLCALC 114*  TRIG 46  CHOLHDL 3.7   Thyroid Function Tests: Recent Labs    12/05/17 0729  TSH 2.537   Anemia Panel: Recent Labs    12/05/17 0729  VITAMINB12 367   Urine analysis:    Component Value Date/Time   COLORURINE YELLOW 12/04/2017 1919   APPEARANCEUR CLEAR 12/04/2017 1919   LABSPEC 1.013 12/04/2017 1919   PHURINE 7.0 12/04/2017 1919   GLUCOSEU NEGATIVE 12/04/2017 1919   HGBUR MODERATE (A) 12/04/2017 1919   BILIRUBINUR NEGATIVE 12/04/2017 1919   KETONESUR 5 (A) 12/04/2017 1919   PROTEINUR NEGATIVE 12/04/2017 1919   UROBILINOGEN 0.2 08/25/2007 0835   NITRITE NEGATIVE 12/04/2017 1919   LEUKOCYTESUR NEGATIVE 12/04/2017 1919     )No results found for this or any previous visit (from the past 240 hour(s)).    Anti-infectives (From admission, onward)   None       Radiology Studies: Ct Angio Head W Or Wo Contrast  Result Date: 12/04/2017 CLINICAL DATA:  Focal neuro deficit for greater than 6 hours. EXAM: CT ANGIOGRAPHY HEAD AND NECK CT PERFUSION BRAIN TECHNIQUE: Multidetector CT imaging of the head and neck was performed using the standard protocol during bolus administration of intravenous contrast. Multiplanar CT image reconstructions and MIPs were obtained to evaluate the vascular anatomy. Carotid stenosis measurements (when applicable) are obtained utilizing NASCET criteria, using the distal internal carotid diameter as the  denominator. Multiphase CT imaging of the brain was performed following IV bolus contrast injection. Subsequent parametric perfusion maps were calculated using RAPID software. CONTRAST:  164mL ISOVUE-370 IOPAMIDOL (ISOVUE-370) INJECTION 76% COMPARISON:  CT head without contrast from the same day. FINDINGS: CTA NECK FINDINGS Aortic arch: A 3 vessel arch configuration is present. There is minimal calcification at the origin of the left subclavian artery. Additional calcifications are present in the distal arch without aneurysm or stenosis. Right carotid system: The right common carotid artery is within normal limits. Atherosclerotic calcifications are present at the  bifurcation. There is no significant stenosis. Cervical right ICA is within normal limits. Left carotid system: Is the left common carotid artery is within normal limits. Atherosclerotic changes are present at the bifurcation. There is no significant stenosis relative to the more distal vessel. The cervical left ICA is unremarkable. Vertebral arteries: The left vertebral artery is dominant to the right. Both vertebral arteries originate from the subclavian arteries without significant stenosis. There tortuosity in the proximal left vertebral artery. No stenoses are present in either vertebral artery throughout the neck. Skeleton: Grade 1 anterolisthesis is present at C3-4. There is chronic loss of disc height at C4-5, C5-6, and C6-7. Other neck: The soft tissues the neck are otherwise unremarkable. Salivary glands are within normal limits bilaterally. No focal mucosal or submucosal lesions are present. The thyroid is within normal limits. There is no significant adenopathy. Upper chest: Interlobular septal thickening is present. There is no focal consolidation. Review of the MIP images confirms the above findings CTA HEAD FINDINGS Anterior circulation: Atherosclerotic irregularity is present in the cavernous and precavernous internal carotid arteries  bilaterally. There is no significant stenosis through the ICA termini. The right A1 segment is hypoplastic. Anterior communicating artery is patent. Both A2 segments fill. M1 segments are within normal limits. MCA bifurcations are normal. Is small vessel attenuation is present in the MCA branch vessels bilaterally without a significant proximal stenosis or occlusion. Posterior circulation: The left vertebral artery is the dominant vessel. PICA origins are visualized and normal bilaterally. The basilar artery is within normal limits. Both posterior cerebral arteries originate from the basilar tip. The PCA branch vessels are within normal limits. Venous sinuses: Dural sinuses are patent. The right transverse sinus is dominant. Straight sinus deep cerebral veins are intact. Cortical veins are unremarkable. Anatomic variants: None. Review of the MIP images confirms the above findings CT Brain Perfusion Findings: CBF (<30%) Volume: 86mL Perfusion (Tmax>6.0s) volume: 7mL A remote left occipital lobe infarct is noted. IMPRESSION: 1. Mild atherosclerotic changes at the origin of the left subclavian artery and at the carotid bifurcations bilaterally without significant stenosis relative to the more distal vessels. 2. Atherosclerotic irregularity within the cavernous and precavernous internal carotid arteries bilaterally without significant stenosis. 3. Distal small vessel disease in the MCA branch vessels bilaterally without significant proximal stenosis, aneurysm, or branch vessel occlusion. 4. Remote left occipital lobe infarct. 5. Perfusion imaging demonstrates no acute infarct or significant ischemia. 6. Spondylosis of the cervical spine as described. Electronically Signed   By: San Morelle M.D.   On: 12/04/2017 20:47   Ct Head Wo Contrast  Result Date: 12/04/2017 CLINICAL DATA:  Pain following fall.  Expressive aphasia. EXAM: CT HEAD WITHOUT CONTRAST TECHNIQUE: Contiguous axial images were obtained from the  base of the skull through the vertex without intravenous contrast. COMPARISON:  None. FINDINGS: Brain: There is moderate generalized ventricular enlargement. There is milder sulcal prominence diffusely. There is no appreciable intracranial mass, hemorrhage, extra-axial fluid collection, midline shift. There is a small focus of 4 decreased attenuation in the mid left cerebellum which is rather ill-defined and is concerning for a small acute infarct in the mid left cerebellar hemisphere. Elsewhere, there is patchy small vessel disease in the centra semiovale bilaterally. There is small vessel disease in the posterior limb of each internal capsule. Vascular: No hyperdense vessel. There is calcification in each carotid siphon region. Skull: The bony calvarium appears intact. Sinuses/Orbits: There is mucosal thickening in several ethmoid air cells. There is slight mucosal thickening in  the lateral right maxillary antrum. Other paranasal sinuses are clear. Orbits appear symmetric bilaterally except for apparent prior cataract surgery on the left. Other: Mastoid air cells are clear. There is debris in each external auditory canal. IMPRESSION: 1. Focal area of decreased attenuation in the mid left cerebellum, concerning for recent and possibly acute infarct in this area. 2. Atrophy with ventricles somewhat larger in proportion in sulci. Question a degree of superimposed normal pressure hydrocephalus. 3. Patchy supratentorial small vessel disease. No mass or hemorrhage. 4.  There are foci of arterial vascular calcification. 5.  Mild paranasal sinus disease. Electronically Signed   By: Lowella Grip III M.D.   On: 12/04/2017 18:59   Ct Angio Neck W Or Wo Contrast  Result Date: 12/04/2017 CLINICAL DATA:  Focal neuro deficit for greater than 6 hours. EXAM: CT ANGIOGRAPHY HEAD AND NECK CT PERFUSION BRAIN TECHNIQUE: Multidetector CT imaging of the head and neck was performed using the standard protocol during bolus  administration of intravenous contrast. Multiplanar CT image reconstructions and MIPs were obtained to evaluate the vascular anatomy. Carotid stenosis measurements (when applicable) are obtained utilizing NASCET criteria, using the distal internal carotid diameter as the denominator. Multiphase CT imaging of the brain was performed following IV bolus contrast injection. Subsequent parametric perfusion maps were calculated using RAPID software. CONTRAST:  136mL ISOVUE-370 IOPAMIDOL (ISOVUE-370) INJECTION 76% COMPARISON:  CT head without contrast from the same day. FINDINGS: CTA NECK FINDINGS Aortic arch: A 3 vessel arch configuration is present. There is minimal calcification at the origin of the left subclavian artery. Additional calcifications are present in the distal arch without aneurysm or stenosis. Right carotid system: The right common carotid artery is within normal limits. Atherosclerotic calcifications are present at the bifurcation. There is no significant stenosis. Cervical right ICA is within normal limits. Left carotid system: Is the left common carotid artery is within normal limits. Atherosclerotic changes are present at the bifurcation. There is no significant stenosis relative to the more distal vessel. The cervical left ICA is unremarkable. Vertebral arteries: The left vertebral artery is dominant to the right. Both vertebral arteries originate from the subclavian arteries without significant stenosis. There tortuosity in the proximal left vertebral artery. No stenoses are present in either vertebral artery throughout the neck. Skeleton: Grade 1 anterolisthesis is present at C3-4. There is chronic loss of disc height at C4-5, C5-6, and C6-7. Other neck: The soft tissues the neck are otherwise unremarkable. Salivary glands are within normal limits bilaterally. No focal mucosal or submucosal lesions are present. The thyroid is within normal limits. There is no significant adenopathy. Upper chest:  Interlobular septal thickening is present. There is no focal consolidation. Review of the MIP images confirms the above findings CTA HEAD FINDINGS Anterior circulation: Atherosclerotic irregularity is present in the cavernous and precavernous internal carotid arteries bilaterally. There is no significant stenosis through the ICA termini. The right A1 segment is hypoplastic. Anterior communicating artery is patent. Both A2 segments fill. M1 segments are within normal limits. MCA bifurcations are normal. Is small vessel attenuation is present in the MCA branch vessels bilaterally without a significant proximal stenosis or occlusion. Posterior circulation: The left vertebral artery is the dominant vessel. PICA origins are visualized and normal bilaterally. The basilar artery is within normal limits. Both posterior cerebral arteries originate from the basilar tip. The PCA branch vessels are within normal limits. Venous sinuses: Dural sinuses are patent. The right transverse sinus is dominant. Straight sinus deep cerebral veins are intact. Cortical  veins are unremarkable. Anatomic variants: None. Review of the MIP images confirms the above findings CT Brain Perfusion Findings: CBF (<30%) Volume: 70mL Perfusion (Tmax>6.0s) volume: 29mL A remote left occipital lobe infarct is noted. IMPRESSION: 1. Mild atherosclerotic changes at the origin of the left subclavian artery and at the carotid bifurcations bilaterally without significant stenosis relative to the more distal vessels. 2. Atherosclerotic irregularity within the cavernous and precavernous internal carotid arteries bilaterally without significant stenosis. 3. Distal small vessel disease in the MCA branch vessels bilaterally without significant proximal stenosis, aneurysm, or branch vessel occlusion. 4. Remote left occipital lobe infarct. 5. Perfusion imaging demonstrates no acute infarct or significant ischemia. 6. Spondylosis of the cervical spine as described.  Electronically Signed   By: San Morelle M.D.   On: 12/04/2017 20:47   Mr Brain Wo Contrast  Result Date: 12/05/2017 CLINICAL DATA:  Fall with gait abnormality.  Expressive aphasia. EXAM: MRI HEAD WITHOUT CONTRAST TECHNIQUE: Multiplanar, multiecho pulse sequences of the brain and surrounding structures were obtained without intravenous contrast. COMPARISON:  Head CT 12/04/2017 FINDINGS: Brain: The midline structures are normal. There is no acute infarct or acute hemorrhage. No mass lesion, hydrocephalus, dural abnormality or extra-axial collection. There is periventricular white matter hyperintensity consistent with chronic small vessel disease. Generalized volume loss with ex vacuo dilatation of the ventricles. No chronic microhemorrhage or superficial siderosis. Vascular: Major intracranial arterial and venous sinus flow voids are preserved. Skull and upper cervical spine: The visualized skull base, calvarium, upper cervical spine and extracranial soft tissues are normal. Sinuses/Orbits: No fluid levels or advanced mucosal thickening. No mastoid or middle ear effusion. Normal orbits. IMPRESSION: Generalized volume loss and sequelae of chronic ischemic microangiopathy without acute intracranial abnormality. Electronically Signed   By: Ulyses Jarred M.D.   On: 12/05/2017 20:00   Ct Cerebral Perfusion W Contrast  Result Date: 12/04/2017 CLINICAL DATA:  Focal neuro deficit for greater than 6 hours. EXAM: CT ANGIOGRAPHY HEAD AND NECK CT PERFUSION BRAIN TECHNIQUE: Multidetector CT imaging of the head and neck was performed using the standard protocol during bolus administration of intravenous contrast. Multiplanar CT image reconstructions and MIPs were obtained to evaluate the vascular anatomy. Carotid stenosis measurements (when applicable) are obtained utilizing NASCET criteria, using the distal internal carotid diameter as the denominator. Multiphase CT imaging of the brain was performed following IV  bolus contrast injection. Subsequent parametric perfusion maps were calculated using RAPID software. CONTRAST:  149mL ISOVUE-370 IOPAMIDOL (ISOVUE-370) INJECTION 76% COMPARISON:  CT head without contrast from the same day. FINDINGS: CTA NECK FINDINGS Aortic arch: A 3 vessel arch configuration is present. There is minimal calcification at the origin of the left subclavian artery. Additional calcifications are present in the distal arch without aneurysm or stenosis. Right carotid system: The right common carotid artery is within normal limits. Atherosclerotic calcifications are present at the bifurcation. There is no significant stenosis. Cervical right ICA is within normal limits. Left carotid system: Is the left common carotid artery is within normal limits. Atherosclerotic changes are present at the bifurcation. There is no significant stenosis relative to the more distal vessel. The cervical left ICA is unremarkable. Vertebral arteries: The left vertebral artery is dominant to the right. Both vertebral arteries originate from the subclavian arteries without significant stenosis. There tortuosity in the proximal left vertebral artery. No stenoses are present in either vertebral artery throughout the neck. Skeleton: Grade 1 anterolisthesis is present at C3-4. There is chronic loss of disc height at C4-5, C5-6, and C6-7.  Other neck: The soft tissues the neck are otherwise unremarkable. Salivary glands are within normal limits bilaterally. No focal mucosal or submucosal lesions are present. The thyroid is within normal limits. There is no significant adenopathy. Upper chest: Interlobular septal thickening is present. There is no focal consolidation. Review of the MIP images confirms the above findings CTA HEAD FINDINGS Anterior circulation: Atherosclerotic irregularity is present in the cavernous and precavernous internal carotid arteries bilaterally. There is no significant stenosis through the ICA termini. The right  A1 segment is hypoplastic. Anterior communicating artery is patent. Both A2 segments fill. M1 segments are within normal limits. MCA bifurcations are normal. Is small vessel attenuation is present in the MCA branch vessels bilaterally without a significant proximal stenosis or occlusion. Posterior circulation: The left vertebral artery is the dominant vessel. PICA origins are visualized and normal bilaterally. The basilar artery is within normal limits. Both posterior cerebral arteries originate from the basilar tip. The PCA branch vessels are within normal limits. Venous sinuses: Dural sinuses are patent. The right transverse sinus is dominant. Straight sinus deep cerebral veins are intact. Cortical veins are unremarkable. Anatomic variants: None. Review of the MIP images confirms the above findings CT Brain Perfusion Findings: CBF (<30%) Volume: 63mL Perfusion (Tmax>6.0s) volume: 1mL A remote left occipital lobe infarct is noted. IMPRESSION: 1. Mild atherosclerotic changes at the origin of the left subclavian artery and at the carotid bifurcations bilaterally without significant stenosis relative to the more distal vessels. 2. Atherosclerotic irregularity within the cavernous and precavernous internal carotid arteries bilaterally without significant stenosis. 3. Distal small vessel disease in the MCA branch vessels bilaterally without significant proximal stenosis, aneurysm, or branch vessel occlusion. 4. Remote left occipital lobe infarct. 5. Perfusion imaging demonstrates no acute infarct or significant ischemia. 6. Spondylosis of the cervical spine as described. Electronically Signed   By: San Morelle M.D.   On: 12/04/2017 20:47        Scheduled Meds: . aspirin EC  81 mg Oral Daily  . enoxaparin (LOVENOX) injection  40 mg Subcutaneous Q24H  . ofloxacin  1 drop Left Eye BID  . pravastatin  20 mg Oral q1800  . prednisoLONE acetate  1 drop Left Eye BID  . vitamin B-12  250 mcg Oral Daily    Continuous Infusions:    LOS: 1 day    Time spent: 25 min    Geradine Girt, DO [facility] Pager [phone]  If 7PM-7AM, please contact night-coverage [email or web address] Password [email or web address] 12/06/2017, 2:26 PM

## 2017-12-06 NOTE — Evaluation (Addendum)
Speech Language Pathology Evaluation Patient Details Name: Tammy Arnold MRN: [id number] DOB: [birth year] Today's Date: 12/06/2017 Time: 6387-5643 SLP Time Calculation (min) (ACUTE ONLY): 20 min  Problem List:  Patient Active Problem List   Diagnosis Date Noted  . Ataxia 12/05/2017  . Cognitive impairment   . Balint's syndrome   . Benign essential HTN   . Abnormal MRI   . Blurry vision   . Hypokalemia   . Acute blood loss anemia   . TIA (transient ischemic attack) 12/04/2017  . Essential hypertension 12/04/2017  . HLD (hyperlipidemia) 12/04/2017   Past Medical History:  Past Medical History:  Diagnosis Date  . Arthritis   . High cholesterol   . Hypertension    Past Surgical History:  Past Surgical History:  Procedure Laterality Date  . CATARACT EXTRACTION  11/23/2017  . REPLACEMENT TOTAL KNEE     HPI:  Tammy Arnold a 77 y.o.femalewithhistory of hypertension hyperlipidemia who was taken off her medications recently after patient had an allergic reaction last month patient performed purpuric spots on the skin which was confirmed by biopsy was brought to the ER after patient had a fall at home. As per the patient's husband patient was last seen around 11:45 AM and on returning back home around 4 PM patient was found to be on the floor. Patient was conscious. Patient has poor memory and does not remember why she fell. After which patient was helped and made to sit on the chair. During which it was noted that patient had ataxic gait. Patient not had any difficulty swallowing or speaking. When patient's husband went to the room and come back she had a fall onto the floor. Did not lose consciousness or hit her head. Patient was brought to the ER. In the ER patient had CT head followed by CT angiogram of the head and neck which all were unremarkable. Patient is being admitted for further management of possible TIA versus stroke. On exam patient is able to move all  extremities without difficulty. Pupils are equal and reactive to light. Patient passed swallow. MRI reveals generalized volume loss and sequelae of chronic ischemic. microangiopathy without acute intracranial abnormality. Most recent neurology note (12/05/17) states that pt continues to present with cognitive impairment with visual disturbance, consistent with balint syndrome.   Assessment / Plan / Recommendation Clinical Impression  Pt presents with severe cognitive deficits during this evaluation. Husband present and reports steady congitive decline for last 2 years. He provides example that 2 years prior to present pt "filled out crossword puzzle in newspaper" but would show it to him and nothing would be written on the puzzle with pt having no awareness. He provides that most recently (September) she had appointment with PCP d/t concerns with her memory. During this appointment, she was not able to recall known information such as the county in which [place]. As a result, PCP refered pt to neurologist however pt and husband cancelled appointment d/t fear of receiving dementia diagnosis. Since this fall, pt has been unable to organize trips to grocery store to purchase food for family get togethers such as Thanksgiving even with support of her sister. She is unable to grasp phone correctly to talk on phone with no awarness that she is talking on it upside down. Over the last two years and most specifically since the fall of 2018 pt has declined most external activities d/t overall deficits. Per neurologist note, some of pt's cognitive deficits appear related to balint sydrome  however I suspect given rapid onset and severity of deficits, that pt may also have an overlapping neurodegenerative disease that impacts all level of cognitive function. Pt is unable to comprehend or demonstrate (verbally) any comprehension of information suggesting severe memory deficits. This Probation officer attempted to administer MOCA  Blind (without any visual information) and pt unable to comprehend any tasks or information presented (despite Total A support). Pt unable to answer simple yes/no questions about number of children that she has or simple biographical information. Of note, pt with intermittent oral apraxia that is nonimpactful on speech. When attempting to orally take applesauce off spoon, pt with pursed lips and could only take 1/2 of bolus from spoon with forward tongue movement. Pt also perceived that she had swallowed her pills while 2 were remaining mouth. Pt unable to follow any strategies and applesauce bolus given with education provided to nursing to provide applesauce if pt unable to consume whole pills. Nursing states that just this morning, pt able to take whole without any oral discoordination.   Husband appeared supportive but made several comments that weren't conducive to emotional support of wife. He stated several times that he was surprised his wife "was this severe," "she had been a Land but obsviously was thinking like that now" and that he "wouldn't be able to take her home like this" - all while in front of his wife. Husband doesnt' indicate that he will be able to provide 24 hour support or Min A within home environment. Given pt's severity level and husband's indication that he can't provide care of wife within home setting, I am not recommending Inpatient Rehab. Recommend acute ST to follow while in hospital to help create compensatory strategies to pt increase function.     SLP Assessment  SLP Recommendation/Assessment: Patient needs continued Speech Lanaguage Pathology Services SLP Visit Diagnosis: Cognitive communication deficit (R41.841)    Follow Up Recommendations  Skilled Nursing facility    Frequency and Duration min 2x/week  2 weeks      SLP Evaluation Cognition  Overall Cognitive Status: Impaired/Different from baseline Arousal/Alertness: Awake/alert Orientation  Level: Oriented to person;Disoriented to place;Disoriented to time;Disoriented to situation Attention: Sustained Sustained Attention: Impaired Sustained Attention Impairment: Verbal basic;Functional basic Memory: Impaired Memory Impairment: Retrieval deficit;Decreased recall of new information;Decreased short term memory;Prospective memory;Storage deficit;Decreased long term memory Decreased Long Term Memory: Verbal basic;Functional basic Decreased Short Term Memory: Verbal basic;Functional basic Awareness: Appears intact Problem Solving: Impaired Problem Solving Impairment: Verbal basic;Functional basic Executive Function: (all areas impacted by lower level deficits) Behaviors: Restless Safety/Judgment: Impaired       Comprehension  Auditory Comprehension Overall Auditory Comprehension: Impaired Yes/No Questions: Impaired Basic Biographical Questions: 0-25% accurate Commands: Impaired One Step Basic Commands: 0-24% accurate Conversation: Simple Interfering Components: Visual impairments;Processing speed;Working Marine scientist;Attention;Motor planning EffectiveTechniques: (Pt required Total A to complete tasks) Visual Recognition/Discrimination Discrimination: Not tested Reading Comprehension Reading Status: Not tested    Expression Expression Primary Mode of Expression: Verbal Verbal Expression Overall Verbal Expression: Appears within functional limits for tasks assessed Naming: Impairment(d/t memory impairments) Written Expression Dominant Hand: Right Written Expression: Not tested   Oral / Motor  Oral Motor/Sensory Function Overall Oral Motor/Sensory Function: Within functional limits Motor Speech Overall Motor Speech: Appears within functional limits for tasks assessed(suspect mild oral apraxia w/ manpulation of medicines who) Respiration: Within functional limits Phonation: Normal Resonance: Within functional limits Articulation: Within functional  limitis Intelligibility: Intelligible Motor Planning: Impaired(oral apraxia - nonimpactful on speech) Motor Speech Errors: Not applicable  GO                    Levone Otten 12/06/2017, 12:09 PM

## 2017-12-06 NOTE — Progress Notes (Signed)
Pt observed to be very confused and agitated, requested to use the bathroom at 2100 but could not void, bladder scanned to a volume of greater than 522ml, Dr Wendee Beavers (on call) paged and notified, ordered a one time in and out cath, pt later had an incontinent episode in the bed at 2200, a post void bladder scan done, read about 15ml, pt cleaned up and made comfortable in bed, will continue to monitor. Obasogie-Asidi, Tenee Wish Efe

## 2017-12-06 NOTE — Procedures (Signed)
EEG Report  Clinical History:  Unwitnessed fall without recollection.  MRI shows global atrophy.  Technical Summary:  A 19 channel digital EEG recording was performed using the 10-20 international system of electrode placement.  Bipolar and Referential montages were used.  The total recording time was approx 20 minutes.  Findings:  There is no posterior dominant alpha rhythm.  Posterior frequencies are 5-6 Hz and symmetrical.   No focal slowing is present.  Photic stimulation and hyperventilation are not performed.  There are intermittent triphasic waveforms symmetrically.  There are no epileptiform discharges or electrographic seizures. Sleep was not recorded.  Impression:  This is an abnormal EEG. There is evidence of moderate generalized slowing of brain activity which is non-specific but may be due to underlying dementia or toxic, metabolic, or infectious etiologies.  Clinical correlation is recommended.  The patient is not in non-convulsive status epilepticus.  Rogue Jury, MS, MD

## 2017-12-07 ENCOUNTER — Inpatient Hospital Stay (HOSPITAL_COMMUNITY): Payer: Medicare Other

## 2017-12-07 ENCOUNTER — Inpatient Hospital Stay (HOSPITAL_COMMUNITY)
Admission: RE | Admit: 2017-12-07 | Discharge: 2017-12-27 | DRG: 123 | Disposition: A | Discharge status: Skilled Nursing Facility | Payer: Medicare Other | Source: Intra-hospital | Attending: Physical Medicine & Rehabilitation | Admitting: Physical Medicine & Rehabilitation

## 2017-12-07 ENCOUNTER — Encounter (HOSPITAL_COMMUNITY): Payer: Self-pay | Admitting: Nurse Practitioner

## 2017-12-07 DIAGNOSIS — H518 Other specified disorders of binocular movement: Principal | ICD-10-CM | POA: Diagnosis present

## 2017-12-07 DIAGNOSIS — Z883 Allergy status to other anti-infective agents status: Secondary | ICD-10-CM

## 2017-12-07 DIAGNOSIS — R488 Other symbolic dysfunctions: Secondary | ICD-10-CM | POA: Diagnosis present

## 2017-12-07 DIAGNOSIS — H538 Other visual disturbances: Secondary | ICD-10-CM | POA: Diagnosis not present

## 2017-12-07 DIAGNOSIS — R509 Fever, unspecified: Secondary | ICD-10-CM | POA: Diagnosis present

## 2017-12-07 DIAGNOSIS — R4189 Other symptoms and signs involving cognitive functions and awareness: Secondary | ICD-10-CM

## 2017-12-07 DIAGNOSIS — Z881 Allergy status to other antibiotic agents status: Secondary | ICD-10-CM | POA: Diagnosis not present

## 2017-12-07 DIAGNOSIS — R159 Full incontinence of feces: Secondary | ICD-10-CM | POA: Diagnosis not present

## 2017-12-07 DIAGNOSIS — I10 Essential (primary) hypertension: Secondary | ICD-10-CM | POA: Diagnosis present

## 2017-12-07 DIAGNOSIS — Z880 Allergy status to penicillin: Secondary | ICD-10-CM

## 2017-12-07 DIAGNOSIS — R339 Retention of urine, unspecified: Secondary | ICD-10-CM | POA: Diagnosis present

## 2017-12-07 DIAGNOSIS — E876 Hypokalemia: Secondary | ICD-10-CM | POA: Diagnosis present

## 2017-12-07 DIAGNOSIS — R270 Ataxia, unspecified: Secondary | ICD-10-CM | POA: Diagnosis present

## 2017-12-07 DIAGNOSIS — E2601 Conn's syndrome: Secondary | ICD-10-CM | POA: Diagnosis not present

## 2017-12-07 DIAGNOSIS — N39498 Other specified urinary incontinence: Secondary | ICD-10-CM | POA: Diagnosis not present

## 2017-12-07 DIAGNOSIS — G3184 Mild cognitive impairment, so stated: Secondary | ICD-10-CM | POA: Diagnosis not present

## 2017-12-07 DIAGNOSIS — I679 Cerebrovascular disease, unspecified: Secondary | ICD-10-CM | POA: Diagnosis not present

## 2017-12-07 DIAGNOSIS — Z792 Long term (current) use of antibiotics: Secondary | ICD-10-CM

## 2017-12-07 DIAGNOSIS — E7800 Pure hypercholesterolemia, unspecified: Secondary | ICD-10-CM | POA: Diagnosis present

## 2017-12-07 DIAGNOSIS — D62 Acute posthemorrhagic anemia: Secondary | ICD-10-CM | POA: Diagnosis present

## 2017-12-07 DIAGNOSIS — M6281 Muscle weakness (generalized): Secondary | ICD-10-CM | POA: Diagnosis not present

## 2017-12-07 DIAGNOSIS — R278 Other lack of coordination: Secondary | ICD-10-CM | POA: Diagnosis not present

## 2017-12-07 DIAGNOSIS — G4700 Insomnia, unspecified: Secondary | ICD-10-CM | POA: Diagnosis present

## 2017-12-07 DIAGNOSIS — M549 Dorsalgia, unspecified: Secondary | ICD-10-CM | POA: Diagnosis present

## 2017-12-07 DIAGNOSIS — H539 Unspecified visual disturbance: Secondary | ICD-10-CM | POA: Diagnosis present

## 2017-12-07 DIAGNOSIS — I7389 Other specified peripheral vascular diseases: Secondary | ICD-10-CM | POA: Diagnosis present

## 2017-12-07 DIAGNOSIS — F0280 Dementia in other diseases classified elsewhere without behavioral disturbance: Secondary | ICD-10-CM | POA: Diagnosis present

## 2017-12-07 DIAGNOSIS — Z79899 Other long term (current) drug therapy: Secondary | ICD-10-CM | POA: Diagnosis not present

## 2017-12-07 DIAGNOSIS — R410 Disorientation, unspecified: Secondary | ICD-10-CM | POA: Diagnosis not present

## 2017-12-07 DIAGNOSIS — R32 Unspecified urinary incontinence: Secondary | ICD-10-CM | POA: Diagnosis not present

## 2017-12-07 DIAGNOSIS — M7989 Other specified soft tissue disorders: Secondary | ICD-10-CM | POA: Diagnosis not present

## 2017-12-07 DIAGNOSIS — G319 Degenerative disease of nervous system, unspecified: Secondary | ICD-10-CM | POA: Diagnosis present

## 2017-12-07 LAB — CBC
HEMATOCRIT: 35.8 % — AB (ref 36.0–46.0)
HEMOGLOBIN: 11.6 g/dL — AB (ref 12.0–15.0)
MCH: 27.9 pg (ref 26.0–34.0)
MCHC: 32.4 g/dL (ref 30.0–36.0)
MCV: 86.1 fL (ref 78.0–100.0)
Platelets: 254 10*3/uL (ref 150–400)
RBC: 4.16 MIL/uL (ref 3.87–5.11)
RDW: 13.6 % (ref 11.5–15.5)
WBC: 9.1 10*3/uL (ref 4.0–10.5)

## 2017-12-07 LAB — URINALYSIS, ROUTINE W REFLEX MICROSCOPIC
BILIRUBIN URINE: NEGATIVE
Glucose, UA: NEGATIVE mg/dL
Ketones, ur: 5 mg/dL — AB
LEUKOCYTES UA: NEGATIVE
NITRITE: NEGATIVE
PH: 7 (ref 5.0–8.0)
Protein, ur: NEGATIVE mg/dL
Specific Gravity, Urine: 1.014 (ref 1.005–1.030)

## 2017-12-07 LAB — BASIC METABOLIC PANEL
ANION GAP: 14 (ref 5–15)
BUN: 9 mg/dL (ref 6–20)
CHLORIDE: 106 mmol/L (ref 101–111)
CO2: 21 mmol/L — AB (ref 22–32)
Calcium: 9.1 mg/dL (ref 8.9–10.3)
Creatinine, Ser: 0.73 mg/dL (ref 0.44–1.00)
GFR calc Af Amer: >60 mL/min (ref >60)
GFR calc non Af Amer: >60 mL/min (ref >60)
Glucose, Bld: 103 mg/dL — ABNORMAL HIGH (ref 65–99)
POTASSIUM: 3.3 mmol/L — AB (ref 3.5–5.1)
Sodium: 141 mmol/L (ref 135–145)

## 2017-12-07 LAB — URIC ACID: Uric Acid, Serum: 2.9 mg/dL (ref 2.3–6.6)

## 2017-12-07 LAB — MAGNESIUM: Magnesium: 1.8 mg/dL (ref 1.7–2.4)

## 2017-12-07 LAB — LACTATE DEHYDROGENASE: LDH: 205 U/L — ABNORMAL HIGH (ref 98–192)

## 2017-12-07 MED ORDER — ASPIRIN EC 81 MG PO TBEC
81.0000 mg | DELAYED_RELEASE_TABLET | Freq: Every day | ORAL | Status: DC
  Administered 2017-12-08 – 2017-12-11 (×4): 81 mg via ORAL
  Filled 2017-12-07 (×4): qty 1

## 2017-12-07 MED ORDER — ADULT MULTIVITAMIN W/MINERALS CH
1.0000 | ORAL_TABLET | Freq: Every day | ORAL | Status: DC
  Administered 2017-12-08 – 2017-12-27 (×20): 1 via ORAL
  Filled 2017-12-07 (×20): qty 1

## 2017-12-07 MED ORDER — CYANOCOBALAMIN 250 MCG PO TABS: 250.0000 ug | ORAL_TABLET | Freq: Every day | ORAL | Status: DC

## 2017-12-07 MED ORDER — PROCHLORPERAZINE EDISYLATE 5 MG/ML IJ SOLN: 5.0000 mg | Freq: Four times a day (QID) | INTRAMUSCULAR | Status: DC | PRN

## 2017-12-07 MED ORDER — TRAZODONE HCL 50 MG PO TABS
25.0000 mg | ORAL_TABLET | Freq: Every evening | ORAL | Status: DC | PRN
  Administered 2017-12-18 – 2017-12-26 (×5): 50 mg via ORAL
  Filled 2017-12-07 (×8): qty 1

## 2017-12-07 MED ORDER — PROCHLORPERAZINE 25 MG RE SUPP: 12.5000 mg | Freq: Four times a day (QID) | RECTAL | Status: DC | PRN

## 2017-12-07 MED ORDER — ADULT MULTIVITAMIN W/MINERALS CH
1.0000 | ORAL_TABLET | Freq: Every day | ORAL | Status: DC
  Administered 2017-12-07: 1 via ORAL
  Filled 2017-12-07: qty 1

## 2017-12-07 MED ORDER — PREDNISOLONE ACETATE 1 % OP SUSP
1.0000 [drp] | Freq: Two times a day (BID) | OPHTHALMIC | Status: DC
  Administered 2017-12-08 – 2017-12-11 (×7): 1 [drp] via OPHTHALMIC
  Filled 2017-12-07 (×2): qty 1

## 2017-12-07 MED ORDER — FLEET ENEMA 7-19 GM/118ML RE ENEM: 1.0000 | ENEMA | Freq: Once | RECTAL | Status: DC | PRN

## 2017-12-07 MED ORDER — ALUM & MAG HYDROXIDE-SIMETH 200-200-20 MG/5ML PO SUSP: 30.0000 mL | ORAL | Status: DC | PRN

## 2017-12-07 MED ORDER — LIDOCAINE 5 % EX PTCH: 1.0000 | MEDICATED_PATCH | CUTANEOUS | 0 refills | Status: DC

## 2017-12-07 MED ORDER — FOSFOMYCIN TROMETHAMINE 3 G PO PACK
3.0000 g | PACK | Freq: Once | ORAL | Status: AC
  Administered 2017-12-07: 3 g via ORAL
  Filled 2017-12-07: qty 3

## 2017-12-07 MED ORDER — PROCHLORPERAZINE MALEATE 5 MG PO TABS: 5.0000 mg | ORAL_TABLET | Freq: Four times a day (QID) | ORAL | Status: DC | PRN

## 2017-12-07 MED ORDER — PRAVASTATIN SODIUM 20 MG PO TABS: 20.0000 mg | ORAL_TABLET | Freq: Every day | ORAL | Status: AC

## 2017-12-07 MED ORDER — GUAIFENESIN-DM 100-10 MG/5ML PO SYRP: 5.0000 mL | ORAL_SOLUTION | Freq: Four times a day (QID) | ORAL | Status: DC | PRN

## 2017-12-07 MED ORDER — PRAVASTATIN SODIUM 20 MG PO TABS
20.0000 mg | ORAL_TABLET | Freq: Every day | ORAL | Status: DC
  Administered 2017-12-08 – 2017-12-26 (×19): 20 mg via ORAL
  Filled 2017-12-07 (×19): qty 1

## 2017-12-07 MED ORDER — LIDOCAINE 5 % EX PTCH
1.0000 | MEDICATED_PATCH | CUTANEOUS | Status: DC
  Administered 2017-12-07 – 2017-12-12 (×5): 1 via TRANSDERMAL
  Filled 2017-12-07 (×5): qty 1

## 2017-12-07 MED ORDER — ADULT MULTIVITAMIN W/MINERALS CH: 1.0000 | ORAL_TABLET | Freq: Every day | ORAL | Status: DC

## 2017-12-07 MED ORDER — POLYETHYLENE GLYCOL 3350 17 G PO PACK
17.0000 g | PACK | Freq: Every day | ORAL | Status: DC | PRN
  Administered 2017-12-10 – 2017-12-24 (×3): 17 g via ORAL
  Filled 2017-12-07 (×3): qty 1

## 2017-12-07 MED ORDER — ENOXAPARIN SODIUM 40 MG/0.4ML ~~LOC~~ SOLN
40.0000 mg | SUBCUTANEOUS | Status: DC
  Administered 2017-12-08 – 2017-12-27 (×20): 40 mg via SUBCUTANEOUS
  Filled 2017-12-07 (×20): qty 0.4

## 2017-12-07 MED ORDER — DIPHENHYDRAMINE HCL 12.5 MG/5ML PO ELIX
12.5000 mg | ORAL_SOLUTION | Freq: Four times a day (QID) | ORAL | Status: DC | PRN
  Administered 2017-12-11: 25 mg via ORAL
  Filled 2017-12-07: qty 10

## 2017-12-07 MED ORDER — CYANOCOBALAMIN 250 MCG PO TABS
250.0000 ug | ORAL_TABLET | Freq: Every day | ORAL | Status: DC
  Administered 2017-12-08 – 2017-12-21 (×14): 250 ug via ORAL
  Filled 2017-12-07 (×15): qty 1

## 2017-12-07 MED ORDER — OFLOXACIN 0.3 % OP SOLN
1.0000 [drp] | Freq: Two times a day (BID) | OPHTHALMIC | Status: DC
  Administered 2017-12-08 – 2017-12-11 (×7): 1 [drp] via OPHTHALMIC
  Filled 2017-12-07 (×2): qty 5

## 2017-12-07 MED ORDER — ACETAMINOPHEN 325 MG PO TABS
325.0000 mg | ORAL_TABLET | ORAL | Status: DC | PRN
  Administered 2017-12-08 – 2017-12-16 (×12): 650 mg via ORAL
  Filled 2017-12-07 (×14): qty 2

## 2017-12-07 MED ORDER — BISACODYL 10 MG RE SUPP
10.0000 mg | Freq: Every day | RECTAL | Status: DC | PRN
  Administered 2017-12-11: 10 mg via RECTAL
  Filled 2017-12-07: qty 1

## 2017-12-07 NOTE — Progress Notes (Signed)
I met with pt at bedside, contacted and spoke with spouse by phone and discussed with Dr. Eliseo Squires. We will plan admit to inpt rehab today. I will make the arrangements. [phone]

## 2017-12-07 NOTE — Progress Notes (Signed)
Patient ID: Tammy Arnold, female   DOB: Apr 04, 1941, 77 y.o.   MRN: [id number] Patient admitted to 4W08 via bed escorted by nursing staff.  Due to patients cognition and visual deficits, the team decided to use an enclosure bed to keep the patient safe.  Patient spouse notified of safety plan and in agreement with measures put in place.  Patient complains of pain when moved.  She is inconsistent with following commands and is restless in bed.  Patient appears to be in no immediate distress at this time.  Brita Romp, RN

## 2017-12-07 NOTE — Progress Notes (Signed)
Cristina Gong, RN  Rehab Admission Coordinator  Physical Medicine and Rehabilitation  PMR Pre-admission  Signed  Date of Service:  12/07/2017 2:56 PM       Related encounter: ED to Hosp-Admission (Current) from 12/04/2017 in [facility]      Signed           [] Hide copied text  [] Hover for details   PMR Admission Coordinator Pre-Admission Assessment  Patient: Tammy Arnold is an 77 y.o., female MRN: [id number] DOB: September 18, 1941 Height: 5\' 4"  (162.6 cm) Weight: 56.7 kg (125 lb)                                                                                                                                                  Insurance Information HMO:     PPO:      PCP:      IPA:      80/20: yes     OTHER: no HMO PRIMARY: Medicare a and b      Policy#: [id number]      Subscriber: pt Benefits:  Phone #: online     Name: 12/06/2017 Eff. Date: a 12/24/2006 b 10/25/2016     Deduct: $1340      Out of Pocket Max: none      Life Max: none CIR: 100%      SNF: 20 full days Outpatient: 80%     Co-Pay: 20% Home Health: 100%      Co-Pay: none DME: 80%     Co-Pay: 20% Providers: pt choice  SECONDARY: [employer]      Policy#: [id number]      Subscriber: pt  Medicaid Application Date:       Case Manager:  Disability Application Date:       Case Worker:   Emergency [address]    Name Relation Home Work [place] [phone]     Current Medical History  Patient Admitting Diagnosis:  Debility, Balint syndrome  History of Present Illness:  JME:QASTMH C Henleyis a 77 y.o.femalewith history of HTN, progressive cognitive declineover 2-3 years,blurry vision --recent cataractsurgery left eye.She was admitted on 2/10/19withfall and difficulty talking. CTheadwassuggestive of cerebellar abnormality. CTAhead/neck showed atherosclerotic changes with distal small  vessel disease and remote left occipital lobe infarct --no perfusion deficits.MRI brain showed generalized volume loss with ex vacuo dilation of ventricles and sequelae of chronic ischemic microangiopathy. She continued to have issues with confusion, agitation and visual disturbance. Dr. Erlinda Hong felt that symptoms consistent with Balint syndrome as patient with anomia, impaired cognition, optic ataxia, oculomotor apraxia and simultanagnosia. EEG done revealing moderate generalized slowing question due to dementia, toxic/metabolic encephalopathy or infectious etiology.   Low grade fever with chest xray clear, U/A with many bacteria-patient has  multiple allergies, will treat with one dose fosfomycin form acute hospital. No other cause of fever with no sign of DVT/no headache/no meningeal symptoms, no sinus issues, no sore throat. Found to have use of KPAD for back pain.   Patient complaints of back pain. Myelogram in 2016 and MRI in 2017 with no acute lumbar spine fracture or malalignment. Levoscoliosis better seen on today's xray. Degenerative lumbar spine without canal stenosis. Neural foraminal narrowing L2-3 through L5-S1. Moderate to severe on the right at L5-S1. Low grade LEFT paraspinal muscle strain. Question form inactive in bed.   Dementia workup with RPR negative, B12 lower end of normal but replace. Folate normal, TSH normal. Has an appointment at [facility] 12/13/17.     Past Medical History      Past Medical History:  Diagnosis Date  . Arthritis   . High cholesterol   . Hypertension     Family History  family history includes Dementia in her mother.  Prior Rehab/Hospitalizations:  Has the patient had major surgery during 100 days prior to admission? Yes  Current Medications   Current Facility-Administered Medications:  .  acetaminophen (TYLENOL) tablet 650 mg, 650 mg, Oral, Q4H PRN, 650 mg at 12/06/17 1022 **OR** acetaminophen (TYLENOL) solution 650 mg, 650 mg, Per Tube,  Q4H PRN **OR** acetaminophen (TYLENOL) suppository 650 mg, 650 mg, Rectal, Q4H PRN, Rise Patience, MD .  aspirin EC tablet 81 mg, 81 mg, Oral, Daily, Rosalin Hawking, MD, 81 mg at 12/07/17 1040 .  enoxaparin (LOVENOX) injection 40 mg, 40 mg, Subcutaneous, Q24H, Rise Patience, MD, 40 mg at 12/07/17 1042 .  fosfomycin (MONUROL) packet 3 g, 3 g, Oral, Once, Vann, Jessica U, DO .  lidocaine (LIDODERM) 5 % 1 patch, 1 patch, Transdermal, Q24H, Vann, Jessica U, DO, 1 patch at 12/06/17 1822 .  multivitamin with minerals tablet 1 tablet, 1 tablet, Oral, Daily, Vann, Jessica U, DO .  ofloxacin (OCUFLOX) 0.3 % ophthalmic solution 1 drop, 1 drop, Left Eye, BID, Rise Patience, MD, 1 drop at 12/07/17 1042 .  pravastatin (PRAVACHOL) tablet 20 mg, 20 mg, Oral, q1800, Rosalin Hawking, MD, 20 mg at 12/06/17 1733 .  prednisoLONE acetate (PRED FORTE) 1 % ophthalmic suspension 1 drop, 1 drop, Left Eye, BID, Rise Patience, MD, 1 drop at 12/07/17 1043 .  vitamin B-12 (CYANOCOBALAMIN) tablet 250 mcg, 250 mcg, Oral, Daily, Eulogio Bear U, DO, 250 mcg at 12/07/17 1038  Patients Current Diet: Fall precautions Diet Heart Room service appropriate? Yes; Fluid consistency: Thin Diet - low sodium heart healthy  Precautions / Restrictions Precautions Precautions: Fall Restrictions Weight Bearing Restrictions: No   Has the patient had 2 or more falls or a fall with injury in the past year?No  Prior Activity Level Community (5-7x/wk): Independent without AD. cognition declined over past 2 years.(Drove until 3 to 4 weeks ago)  Development worker, international aid / [place] Devices/Equipment: None Home Equipment: None  Prior Device Use: Indicate devices/aids used by the patient prior to current illness, exacerbation or injury? None of the above  Prior Functional Level Prior Function Level of Independence: Independent(Independent with adls until Sunday pta) Gait / Transfers Assistance  Needed: walked independently pta, bathed self ADL's / Homemaking Assistance Needed: spouse reports he did not assist with adls pta Comments: basline confirmed with spouse  Self Care: Did the patient need help bathing, dressing, using the toilet or eating?  Independent  Indoor Mobility: Did the patient need assistance with walking from room to  room (with or without device)? Independent  Stairs: Did the patient need assistance with internal or external stairs (with or without device)? Independent  Functional Cognition: Did the patient need help planning regular tasks such as shopping or remembering to take medications? Needed some help  Current Functional Level Cognition  Arousal/Alertness: Awake/alert Overall Cognitive Status: Impaired/Different from baseline Current Attention Level: Sustained Orientation Level: Oriented X4 Following Commands: Follows one step commands consistently Safety/Judgement: Decreased awareness of safety, Decreased awareness of deficits General Comments: as pt fatigues and pain increases cognition declines.  Pt very restless today with c/o significant back pain which appears to significantly hinder her ability to consistently sustain attention to tasks - affect different than yesterday  Attention: Sustained Sustained Attention: Impaired Sustained Attention Impairment: Verbal basic, Functional basic Memory: Impaired Memory Impairment: Retrieval deficit, Decreased recall of new information, Decreased short term memory, Prospective memory, Storage deficit, Decreased long term memory Decreased Long Term Memory: Verbal basic, Functional basic Decreased Short Term Memory: Verbal basic, Functional basic Awareness: Appears intact Problem Solving: Impaired Problem Solving Impairment: Verbal basic, Functional basic Executive Function: (all areas impacted by lower level deficits) Behaviors: Restless Safety/Judgment: Impaired    Extremity Assessment (includes  Sensation/Coordination)  Upper Extremity Assessment: RUE deficits/detail, LUE deficits/detail RUE Deficits / Details: dysmetria noted  RUE Coordination: decreased fine motor LUE Deficits / Details: dysmetria noted  LUE Coordination: decreased fine motor  Lower Extremity Assessment: Defer to PT evaluation    ADLs  Overall ADL's : Needs assistance/impaired Eating/Feeding: Minimal assistance, Sitting Grooming: Wash/dry hands, Wash/dry face, Minimal assistance, Standing Grooming Details (indicate cue type and reason): Pt required tactile cues/assist to initiate washing hands and to move through the sequence of doing so, but was able to do so with multimodal cues, and occasional assist  Upper Body Bathing: Moderate assistance, Sitting Lower Body Bathing: Maximal assistance, Sit to/from stand Lower Body Bathing Details (indicate cue type and reason): Pt's IV dislodged when she was moving OOB.  Pt with signifcant bleeding, and assisted with clean up.  Pt demonstrated difficulty targeting specific area to bathe.  She required tactile cues to bathe area, and noted to perseverate on washing her feet.  Pt with back pain and fatigue which further limited her ability to concentrate on task  Upper Body Dressing : Moderate assistance, Sitting Upper Body Dressing Details (indicate cue type and reason): Pt requires mod cues to target sleeve and thread UEs through sleeve  Lower Body Dressing: Minimal assistance Lower Body Dressing Details (indicate cue type and reason): performed socks only - she required min A to don Lt sock - appeared limited by back pain  Toilet Transfer: Minimal assistance, Ambulation, Comfort height toilet, Grab bars Toilet Transfer Details (indicate cue type and reason): Pt ambulated to BR with min HHA to help her navigate unfamiliar environment and for balance  Toileting- Clothing Manipulation and Hygiene: Minimal assistance, Sit to/from stand Toileting - Clothing Manipulation Details  (indicate cue type and reason): Pt able to maneuver gown with min A for balance.  Pt began to clean peri area, then proceeded to reach to floor and wipe floor.  tactile and verbal cues to redirect behavior, then pt able to complete task successfully  Functional mobility during ADLs: Minimal assistance, Moderate assistance    Mobility  Overal bed mobility: Needs Assistance Bed Mobility: Sidelying to Sit, Sit to Supine Sidelying to sit: Min assist Supine to sit: Min assist Sit to supine: Mod assist General bed mobility comments: Pt indicating severe back pain in  sidelying.  She was assisted to EOB, but she demonstrated difficulty problem solving through activity due to distraction from pain.  Assist provided to guide LEs off the bed and to lift his shoulders     Transfers  Overall transfer level: Needs assistance Equipment used: 1 person hand held assist Transfers: Sit to/from Stand, Stand Pivot Transfers Sit to Stand: Min assist Stand pivot transfers: Min assist General transfer comment: assist to steady     Ambulation / Gait / Stairs / Wheelchair Mobility  Ambulation/Gait Ambulation/Gait assistance: Mod assist Ambulation Distance (Feet): 10 Feet Assistive device: 1 person hand held assist Gait Pattern/deviations: Step-to pattern, Narrow base of support, Scissoring General Gait Details: Slow, unsteady ambulation with HHA and modA to maintain balance; pt also reaching for bilat UE support on furniture. Pt with narrow BOS unable to self-correct, requiring cues. Told to sit in recliner, pt attempting to sit in chair next to husband; unable to scan in order to find recliner or follow directional tasks in order to get to it; required physical guidance to chair Gait velocity: Decreased Gait velocity interpretation: <1.8 ft/sec, indicative of risk for recurrent falls    Posture / Balance Dynamic Sitting Balance Sitting balance - Comments: Pt able to sit EOB and EOC with min guard assist.   She was able to reach down to feet to wash and don socks without LOB  Balance Overall balance assessment: Needs assistance Sitting-balance support: Feet supported, Bilateral upper extremity supported Sitting balance-Leahy Scale: Fair Sitting balance - Comments: Pt able to sit EOB and EOC with min guard assist.  She was able to reach down to feet to wash and don socks without LOB  Postural control: Posterior lean Standing balance support: Single extremity supported Standing balance-Leahy Scale: Poor Standing balance comment: requires min A and UE support with an occasional LOB to the Rt requiring mod A to recover     Special needs/care consideration BiPAP/CPAP  N/a CPM  N/a Continuous Drip IV  N/a Dialysis  N/a Life Vest  N/a Oxygen  N/a Special Bed n/a Trach Size  N/a Wound VAC n/a Skin ecchymosis to BUE, ecchymosis to medial back area Bowel mgmt: LBM 2/10 Bladder mgmt: some incontinence; new this admission per spouse; some retention noted with I and O cath performed Diabetic mgmt n/a Sitter at bedside on acute with tele sitter then sitter due to decreased safety awareness and impulsiveness   Previous Home Environment Living Arrangements: Spouse/significant other  Lives With: Spouse Available Help at Discharge: Family, Available 24 hours/day(spouse will hire 24/7 assist as needed) Type of Home: House Home Layout: Two level, Able to live on main level with bedroom/bathroom Alternate Level Stairs-Number of Steps: flight Home Access: Stairs to enter [employer] of Steps: 4 to 5 Bathroom Shower/Tub: Tub/shower unit, Multimedia programmer: Standard Bathroom Accessibility: Yes How Accessible: Accessible via walker Home Care Services: No Additional Comments: verified with spouse  Discharge Living Setting Plans for Discharge Living Setting: [name] home, Lives with (comment)(spouse) Type of Home at Discharge: House Discharge Home Layout: Two level, Able  to live on main level with bedroom/bathroom Alternate Level Stairs-Number of Steps: flight Discharge Home Access: Stairs to enter Entrance Stairs-Number of Steps: 4 to 5 Discharge Bathroom Shower/Tub: Tub/shower unit, Walk-in shower Discharge Bathroom Toilet: Standard Discharge Bathroom Accessibility: Yes How Accessible: Accessible via walker Does the patient have any problems obtaining your medications?: No  Social/Family/Support Systems Patient Roles: Spouse, Parent Contact Information: Fortun[place] Betty, spouse Anticipated Caregiver: spouse and hired caregivers  as needed Anticipated Caregiver's Contact Information: see above Ability/Limitations of Caregiver: spouse works as GYN MD on M[employer], Wed and Friday Caregiver Availability: 24/7 Discharge Plan Discussed with Primary Caregiver: Yes Is Caregiver In Agreement with Plan?: Yes Does Caregiver/Family have Issues with Lodging/Transportation while Pt is in Rehab?: No   Goals/Additional Needs Patient/Family Goal for Rehab: Min assist with PT, OT, and S:LP Expected length of stay: ELOS 14 to 17 days Special Service Needs: new diagnosis this admission. Cognitively impaired mild until Sunday before admit. Gradual decline(Independent with adls pta) Additional Information: Since admit, pt with severe back pain. pt was not on any pain meds pta. chronic lumbar stenosis Pt/Family Agrees to Admission and willing to participate: Yes Program Orientation Provided & Reviewed with Pt/Caregiver Including Roles  & Responsibilities: Yes  Barriers to Discharge: Behavior  Spouse reports slow cognitive decline over past year as pt received one dose of baclofen in ED at that time. Played Bridge 3 times per week until last few months until she made too many mistakes. Drove until 3 to 4 weeks ago. Independent without AD but walked somewhat slow and guarded due to Lumbar stenosis. Functionally ambulatory until Sunday before admit when found down on floor and then  fell again from sited chair.   Decrease burden of Care through IP rehab admission: n/a  Possible need for SNF placement upon discharge: if pt does not reach min assist level of one caregiver support with spouse and hired caregivers, he would look into some SNF rehab. His goal is to have her return home.  Patient Condition: This patient's medical and functional status has changed since the consult dated 12/05/2017 in which the Rehabilitation Physician determined and documented that the patient was potentially appropriate for intensive rehabilitative care in an inpatient rehabilitation facility. Issues have been addressed and update has been discussed with Dr. Posey Pronto and patient now appropriate for inpatient rehabilitation. Will admit to inpatient rehab today.   Preadmission Screen Completed By:  Cleatrice Burke, 12/07/2017 3:12 PM ______________________________________________________________________   Discussed status with Dr. Posey Pronto on 12/07/2017 at  1510 and received telephone approval for admission today.  Admission Coordinator:  Cleatrice Burke, time 4098 Date 12/07/2017             Cosigned by: Jamse Arn, MD at 12/07/2017 3:32 PM  Revision History

## 2017-12-07 NOTE — Progress Notes (Signed)
Jamse Arn, MD  Physician  Physical Medicine and Rehabilitation      Consult Note  Signed     Date of Service:  12/05/2017  1:05 PM         Related encounter: ED to Hosp-Admission (Current) from 12/04/2017 in [facility]           Signed          Expand All Collapse All            Expand widget buttonCollapse widget button     Hide copied text   Hover for detailscustomization button                                                                                                                     untitled image              Physical Medicine and Rehabilitation         Reason for Consult: Debility  Referring Physician: Dr. Eliseo Squires        HPI: Tammy Arnold is a 77 y.o. female with history of HTN, progressive cognitive decline with difficulty speaking, blurry vision due to cataracts and recent surgery left eye. History taken from chart review.  She was admitted on 12/04/17 with fall and difficulty talking. CT head reviewed, suggestive of ?cerebellar abnormality. CTA head/neck per report, showed atherosclerotic changes with distal small vessel disease and remote left occipital lobe infarct --no perfusion deficits. Work up underway. Therapy evaluation done today revealing functional deficits and CIR recommended for follow up therapy. Patient with resulting confusion.       Review of Systems   Unable to perform ROS: Mental acuity           Past Medical History:    Diagnosis   Date    .   Arthritis        .   High cholesterol        .   Hypertension                    Past Surgical History:    Procedure   Laterality   Date    .   CATARACT EXTRACTION            .   REPLACEMENT TOTAL KNEE                        Family History     Problem   Relation   Age of Onset    .   Dementia   Mother              Social History:  Married. Independent PTA.  reports that  has never smoked. she has never used smokeless tobacco. She reports that she does not drink alcohol. Her drug history is not on file.               Allergies    Allergen  Reactions    .   Baclofen   Other (See Comments)            Caused brain dysfunction    .   Keflex [Cephalexin]   Rash    .   Macrodantin [Nitrofurantoin]   Rash    .   Neggram [Nalidixic Acid]   Rash    .   Penicillins   Rash            Has patient had a PCN reaction causing immediate rash, facial/tongue/throat swelling, SOB or lightheadedness with hypotension: Yes  Has patient had a PCN reaction causing severe rash involving mucus membranes or skin necrosis: No  Has patient had a PCN reaction that required hospitalization: No  Has patient had a PCN reaction occurring within the last 10 years: No  If all of the above answers are "NO", then may proceed with Cephalosporin use.    .   Sulfa Antibiotics   Rash                 Medications Prior to Admission    Medication   Sig   Dispense   Refill    .   Difluprednate (DUREZOL) 0.05 % EMUL   Place 1 drop into the left eye See admin instructions. Instill one drop into left eye twice daily for 3 weeks starting after surgery (surgery date 11/23/17)            .   ofloxacin (OCUFLOX) 0.3 % ophthalmic solution   Place 1 drop into the left eye See admin instructions. Instill one drop into left eye twice daily starting 3 days prior to surgery and continuing for 3 weeks after surgery (surgery date 11/23/17)       1    .   baclofen (LIORESAL) 10 MG tablet   Take 5 mg of baclofen in the morning and 10 mg at night as needed for muscle spasms. (Patient not taking: Reported on 12/04/2017)   30 each   0    .   Diclofenac Sodium CR  (VOLTAREN-XR) 100 MG 24 hr tablet   Take 1 tablet (100 mg total) by mouth daily. (Patient not taking: Reported on 12/04/2017)   10 tablet   0    .   polyethylene glycol powder (GLYCOLAX/MIRALAX) powder   Take 17 g by mouth daily. (Patient not taking: Reported on 12/04/2017)   255 g   0          Home:  Home Living  Family/patient expects to be discharged to:: Private residence  Living Arrangements: Spouse/significant other  Available Help at Discharge: Family, Available PRN/intermittently  Home Equipment: Gilford Rile - 2 wheels   Functional History:  Prior Function  Level of Independence: Needs assistance  Gait / Transfers Assistance Needed: Reports walking independently. Per notes, pt with multiple falls at home but pt endorses only 1 leading to admission.  ADL's / Homemaking Assistance Needed: Per notes, spouse assists with ADLs. Per pt, spouse works during the day.  Comments: Not sure of accuracy of pt's reported PLOF/history as no family members present during evaluation.  Functional Status:   Mobility:  Bed Mobility  Overal bed mobility: Needs Assistance  Bed Mobility: Supine to Sit, Sit to Supine  Supine to sit: Mod assist, HOB elevated  Sit to supine: Mod assist  General bed mobility comments: Multiple attempts to elevate trunk to get to EOB without success as pt gets half way up and falls posteriorly. Mod A  to assist but still with posterior lean supported by UEs. Assist to bring BLEs into bed.  Transfers  Overall transfer level: Needs assistance  Equipment used: 1 person hand held assist  Transfers: Sit to/from Stand  Sit to Stand: Mod assist  General transfer comment: Assist to power to standing from EOB x2 with multiple attempts with Bil knees collapsing at first. Pt facing towards right side in standing despite cues to face forward. SPT bed to/from chair towards right side x2, Difficulty following commands to reach for arm rest with RUE  and impaired sequencing noted as well.   Ambulation/Gait  General Gait Details: Deferred as echo tech present to perform test.       ADL:       Cognition:  Cognition  Overall Cognitive Status: Impaired/Different from baseline  Orientation Level: Disoriented to situation, Disoriented to place, Disoriented to time, Oriented to person  Cognition  Arousal/Alertness: Awake/alert  Behavior During Therapy: Flat affect  Overall Cognitive Status: Impaired/Different from baseline  Area of Impairment: Orientation, Memory, Following commands, Safety/judgement, Awareness, Problem solving  Orientation Level: Disoriented to, Time, Situation  Memory: Decreased short-term memory  Following Commands: Follows multi-step commands inconsistently, Follows one step commands with increased time  Safety/Judgement: Decreased awareness of safety, Decreased awareness of deficits  Awareness: Intellectual  Problem Solving: Slow processing, Difficulty sequencing, Requires verbal cues, Requires tactile cues  General Comments: Able to state she was in the hospital with increased time. Not able to state month despite contextual clues, "v day is coming up" or "second month of the year." Difficulty with short term memory within session- not able to recall name of hospital previously discussed. Pt seems to have right visual field deficits, difficulty counting number of fingers in right visual field and putting RUE in gown for dressing.        Blood pressure (!) 174/81, pulse 93, temperature 99.1 F (37.3 C), temperature source Oral, resp. rate 16, height 5\' 4"  (1.626 m), weight 56.7 kg (125 lb), SpO2 97 %.  Physical Exam   Nursing note and vitals reviewed.  Constitutional: She appears well-developed and well-nourished. No distress.   HENT:   Head: Normocephalic and atraumatic.   Mouth/Throat: Oropharynx is clear and moist.   Eyes: Conjunctivae are normal. Pupils are equal, round, and  reactive to light. Right eye exhibits no discharge. Left eye exhibits no discharge.   Neck: Normal range of motion. Neck supple.   Cardiovascular: Normal rate and regular rhythm.   Respiratory: Effort normal and breath sounds normal. No stridor.   GI: Soft. Bowel sounds are normal. She exhibits no distension. There is no tenderness.  Musculoskeletal: She exhibits no edema or tenderness.  Neurological: She is alert.  Oriented to self only. Unable to recall age or DOB. Delayed processing with difficulty following simple motor commands.  Aphasia Motor: RUE 4/5 proximal to distal with apraxia LUE: 4+/5 proximal to distal  RLE: 4-/5 proximal to distal with apraxia LLE: 4+/5 proximal to distal   Skin: Skin is warm and dry. She is not diaphoretic.  Psychiatric: Her mood appears anxious. Her speech is delayed. Cognition and memory are impaired.         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  Records reviewed and summated above.     1.Does the need for close, 24 hr/day medical supervision in concert with the patient's rehab needs make it unreasonable for this patient to be served in a less intensive setting? Potentially    2.Co-Morbidities requiring supervision/potential complications: HTN (monitor and provide prns in accordance with increased physical exertion and pain), progressive cognitive decline with difficulty speaking, blurry vision due to cataracts and recent surgery left eye, hypokalemia (continue to monitor and replete as necessary), ABLA (transfuse if necessary to ensure appropriate perfusion for increased activity tolerance)   3.Due to safety, disease management, medication administration and patient education, does the patient require 24 hr/day rehab nursing? Yes   4.Does the patient require coordinated care of a physician, rehab nurse, PT (1-2 hrs/day, 5 days/week), OT (1-2 hrs/day, 5 days/week) and SLP (1-2 hrs/day, 5 days/week) to address physical and functional deficits in the context of the above medical diagnosis(es)? Yes Addressing deficits in the following areas: balance, endurance, locomotion, strength, transferring, bathing, dressing, toileting, cognition and psychosocial support   5.Can the  patient actively participate in an intensive therapy program of at least 3 hrs of therapy per day at least 5 days per week? Yes   6.The potential for patient to make measurable gains while on inpatient rehab is good   7.Anticipated functional outcomes upon discharge from inpatient rehab are min assist  with PT, min assist with OT, min assist and mod assist with SLP.   8.Estimated rehab length of stay to reach the above functional goals is: 14-17 days.   9.Anticipated D/C setting: Home   10.Anticipated post D/C treatments: HH therapy and Home excercise program   11.Overall Rehab/Functional Prognosis: good      RECOMMENDATIONS:  This patient's condition is appropriate for continued rehabilitative care in the following setting: Will await completion of medical workup and will also need to inquire about baseline level of functioning.   Patient has agreed to participate in recommended program. Potentially  Note that insurance prior authorization may be required for reimbursement for recommended care.     Comment: Rehab Admissions Coordinator to follow up.     Delice Lesch, MD, ABPMR  Bary Leriche, PA-C  12/05/2017

## 2017-12-07 NOTE — Discharge Summary (Addendum)
Physician Discharge Summary  Tammy Arnold:[id number] DOB: [birth year] DOA: 12/04/2017  PCP: Kelton Pillar, MD  Admit date: 12/04/2017 Discharge date: 12/07/2017   Recommendations for Outpatient Follow-Up:   1. Blood culture and urine culture pending-- have treated urine with 1 dose of fosfomycin-- can repeat dose if needed 2. ASA 81 mg daily 3. BMP PRN for hypokalemia 4. Incentive spirometry   Discharge Diagnosis:   Principal Problem:   TIA (transient ischemic attack) Active Problems:   Essential hypertension   HLD (hyperlipidemia)   Ataxia   Cognitive impairment   Balint's syndrome   Benign essential HTN   Abnormal MRI   Blurry vision   Hypokalemia   Acute blood loss anemia   Discharge disposition:  CIR  Discharge Condition: stable  Diet recommendation: Low sodium, heart healthy  Wound care: None.   History of Present Illness:   Tammy Arnold is a 77 y.o. female with history of hypertension hyperlipidemia who was taken off her medications recently after patient had an allergic reaction last month patient performed purpuric spots on the skin which was confirmed by biopsy was brought to the ER after patient had a fall at home.  As per the patient's husband patient was last seen around 11:45 AM and on returning back home around 4 PM patient was found to be on the floor.  Patient was conscious.  Patient has poor memory and does not remember why she fell.  After which patient was helped and made to sit on the chair.  During which it was noted that patient had ataxic gait.  Patient not had any difficulty swallowing or speaking.  When patient's husband went to the room and come back she had a fall onto the floor.  Did not lose consciousness or hit her head.  Patient was brought to the ER.     Hospital Course by Problem:    Balint syndrome -appreciate neurology consult -MRI brain w/o CVA but does show Generalized volume loss and sequelae of chronic  ischemic microangiopathy without acute intracranial abnormality. - 2D echo: Normal LV size with mild LV hypertrophy. EF 60-65%. Normal RV size and systolic function. No significant valvular abnormalities. -PT-- CIR -pravastatin -EEG with non-specific slowing  Low grade fever -check x ray clear -U/A with many bacteria-- patient has multiple allergies, will treat with fosfomycin -no other focal cause of fever-- no sign of DVT/no headache/no meningeal symptoms, no sinus issues, no sore throat -did have large Kpad on -no h/o connective tissue issues -LDH only mildly elevated -uric acid normal  Back pain -patient has had myelogram in 2016 and MRI in 2017--No acute lumbar spine fracture or malalignment. Levoscoliosis better seen on today's CT. Degenerative lumbar spine without canal stenosis. Neural foraminal narrowing L2-3 through L5-S1: Moderate to severe on the RIGHT at L5-S1. Low-grade LEFT paraspinal muscle strain -? From being inactive in bed -encourage PT  Hypertension -had been taken off meds for rash -if continues to be elevated, add PO medication  Hyperlipidemia -pravastatin ordered  Dementia - RPR negative - B12 lower end of normal but will replace -folate normal -TSH normal -has appointment at [facility] 12/13/17      Medical Consultants:    Rehab  neuro   Discharge Exam:   Vitals:   12/07/17 1243 12/07/17 1344  BP:    Pulse:    Resp:    Temp: 99.5 F (37.5 C) 99.5 F (37.5 C)  SpO2:     Vitals:   12/07/17 0610 12/07/17  0957 12/07/17 1243 12/07/17 1344  BP: (!) 161/58 (!) 148/59    Pulse: 81 80    Resp: 18 18    Temp: 99 F (37.2 C) 99.1 F (37.3 C) 99.5 F (37.5 C) 99.5 F (37.5 C)  TempSrc: Axillary Oral Oral Oral  SpO2: 98% 98%    Weight:      Height:        Gen:  In bed, restless    The results of significant diagnostics from this hospitalization (including imaging, microbiology, ancillary and laboratory) are listed below for  reference.     Procedures and Diagnostic Studies:   Ct Angio Head W Or Wo Contrast  Result Date: 12/04/2017 CLINICAL DATA:  Focal neuro deficit for greater than 6 hours. EXAM: CT ANGIOGRAPHY HEAD AND NECK CT PERFUSION BRAIN TECHNIQUE: Multidetector CT imaging of the head and neck was performed using the standard protocol during bolus administration of intravenous contrast. Multiplanar CT image reconstructions and MIPs were obtained to evaluate the vascular anatomy. Carotid stenosis measurements (when applicable) are obtained utilizing NASCET criteria, using the distal internal carotid diameter as the denominator. Multiphase CT imaging of the brain was performed following IV bolus contrast injection. Subsequent parametric perfusion maps were calculated using RAPID software. CONTRAST:  138mL ISOVUE-370 IOPAMIDOL (ISOVUE-370) INJECTION 76% COMPARISON:  CT head without contrast from the same day. FINDINGS: CTA NECK FINDINGS Aortic arch: A 3 vessel arch configuration is present. There is minimal calcification at the origin of the left subclavian artery. Additional calcifications are present in the distal arch without aneurysm or stenosis. Right carotid system: The right common carotid artery is within normal limits. Atherosclerotic calcifications are present at the bifurcation. There is no significant stenosis. Cervical right ICA is within normal limits. Left carotid system: Is the left common carotid artery is within normal limits. Atherosclerotic changes are present at the bifurcation. There is no significant stenosis relative to the more distal vessel. The cervical left ICA is unremarkable. Vertebral arteries: The left vertebral artery is dominant to the right. Both vertebral arteries originate from the subclavian arteries without significant stenosis. There tortuosity in the proximal left vertebral artery. No stenoses are present in either vertebral artery throughout the neck. Skeleton: Grade 1 anterolisthesis  is present at C3-4. There is chronic loss of disc height at C4-5, C5-6, and C6-7. Other neck: The soft tissues the neck are otherwise unremarkable. Salivary glands are within normal limits bilaterally. No focal mucosal or submucosal lesions are present. The thyroid is within normal limits. There is no significant adenopathy. Upper chest: Interlobular septal thickening is present. There is no focal consolidation. Review of the MIP images confirms the above findings CTA HEAD FINDINGS Anterior circulation: Atherosclerotic irregularity is present in the cavernous and precavernous internal carotid arteries bilaterally. There is no significant stenosis through the ICA termini. The right A1 segment is hypoplastic. Anterior communicating artery is patent. Both A2 segments fill. M1 segments are within normal limits. MCA bifurcations are normal. Is small vessel attenuation is present in the MCA branch vessels bilaterally without a significant proximal stenosis or occlusion. Posterior circulation: The left vertebral artery is the dominant vessel. PICA origins are visualized and normal bilaterally. The basilar artery is within normal limits. Both posterior cerebral arteries originate from the basilar tip. The PCA branch vessels are within normal limits. Venous sinuses: Dural sinuses are patent. The right transverse sinus is dominant. Straight sinus deep cerebral veins are intact. Cortical veins are unremarkable. Anatomic variants: None. Review of the MIP images confirms  the above findings CT Brain Perfusion Findings: CBF (<30%) Volume: 48mL Perfusion (Tmax>6.0s) volume: 11mL A remote left occipital lobe infarct is noted. IMPRESSION: 1. Mild atherosclerotic changes at the origin of the left subclavian artery and at the carotid bifurcations bilaterally without significant stenosis relative to the more distal vessels. 2. Atherosclerotic irregularity within the cavernous and precavernous internal carotid arteries bilaterally without  significant stenosis. 3. Distal small vessel disease in the MCA branch vessels bilaterally without significant proximal stenosis, aneurysm, or branch vessel occlusion. 4. Remote left occipital lobe infarct. 5. Perfusion imaging demonstrates no acute infarct or significant ischemia. 6. Spondylosis of the cervical spine as described. Electronically Signed   By: San Morelle M.D.   On: 12/04/2017 20:47   Ct Head Wo Contrast  Result Date: 12/04/2017 CLINICAL DATA:  Pain following fall.  Expressive aphasia. EXAM: CT HEAD WITHOUT CONTRAST TECHNIQUE: Contiguous axial images were obtained from the base of the skull through the vertex without intravenous contrast. COMPARISON:  None. FINDINGS: Brain: There is moderate generalized ventricular enlargement. There is milder sulcal prominence diffusely. There is no appreciable intracranial mass, hemorrhage, extra-axial fluid collection, midline shift. There is a small focus of 4 decreased attenuation in the mid left cerebellum which is rather ill-defined and is concerning for a small acute infarct in the mid left cerebellar hemisphere. Elsewhere, there is patchy small vessel disease in the centra semiovale bilaterally. There is small vessel disease in the posterior limb of each internal capsule. Vascular: No hyperdense vessel. There is calcification in each carotid siphon region. Skull: The bony calvarium appears intact. Sinuses/Orbits: There is mucosal thickening in several ethmoid air cells. There is slight mucosal thickening in the lateral right maxillary antrum. Other paranasal sinuses are clear. Orbits appear symmetric bilaterally except for apparent prior cataract surgery on the left. Other: Mastoid air cells are clear. There is debris in each external auditory canal. IMPRESSION: 1. Focal area of decreased attenuation in the mid left cerebellum, concerning for recent and possibly acute infarct in this area. 2. Atrophy with ventricles somewhat larger in proportion  in sulci. Question a degree of superimposed normal pressure hydrocephalus. 3. Patchy supratentorial small vessel disease. No mass or hemorrhage. 4.  There are foci of arterial vascular calcification. 5.  Mild paranasal sinus disease. Electronically Signed   By: Lowella Grip III M.D.   On: 12/04/2017 18:59   Ct Angio Neck W Or Wo Contrast  Result Date: 12/04/2017 CLINICAL DATA:  Focal neuro deficit for greater than 6 hours. EXAM: CT ANGIOGRAPHY HEAD AND NECK CT PERFUSION BRAIN TECHNIQUE: Multidetector CT imaging of the head and neck was performed using the standard protocol during bolus administration of intravenous contrast. Multiplanar CT image reconstructions and MIPs were obtained to evaluate the vascular anatomy. Carotid stenosis measurements (when applicable) are obtained utilizing NASCET criteria, using the distal internal carotid diameter as the denominator. Multiphase CT imaging of the brain was performed following IV bolus contrast injection. Subsequent parametric perfusion maps were calculated using RAPID software. CONTRAST:  192mL ISOVUE-370 IOPAMIDOL (ISOVUE-370) INJECTION 76% COMPARISON:  CT head without contrast from the same day. FINDINGS: CTA NECK FINDINGS Aortic arch: A 3 vessel arch configuration is present. There is minimal calcification at the origin of the left subclavian artery. Additional calcifications are present in the distal arch without aneurysm or stenosis. Right carotid system: The right common carotid artery is within normal limits. Atherosclerotic calcifications are present at the bifurcation. There is no significant stenosis. Cervical right ICA is within normal limits. Left  carotid system: Is the left common carotid artery is within normal limits. Atherosclerotic changes are present at the bifurcation. There is no significant stenosis relative to the more distal vessel. The cervical left ICA is unremarkable. Vertebral arteries: The left vertebral artery is dominant to the  right. Both vertebral arteries originate from the subclavian arteries without significant stenosis. There tortuosity in the proximal left vertebral artery. No stenoses are present in either vertebral artery throughout the neck. Skeleton: Grade 1 anterolisthesis is present at C3-4. There is chronic loss of disc height at C4-5, C5-6, and C6-7. Other neck: The soft tissues the neck are otherwise unremarkable. Salivary glands are within normal limits bilaterally. No focal mucosal or submucosal lesions are present. The thyroid is within normal limits. There is no significant adenopathy. Upper chest: Interlobular septal thickening is present. There is no focal consolidation. Review of the MIP images confirms the above findings CTA HEAD FINDINGS Anterior circulation: Atherosclerotic irregularity is present in the cavernous and precavernous internal carotid arteries bilaterally. There is no significant stenosis through the ICA termini. The right A1 segment is hypoplastic. Anterior communicating artery is patent. Both A2 segments fill. M1 segments are within normal limits. MCA bifurcations are normal. Is small vessel attenuation is present in the MCA branch vessels bilaterally without a significant proximal stenosis or occlusion. Posterior circulation: The left vertebral artery is the dominant vessel. PICA origins are visualized and normal bilaterally. The basilar artery is within normal limits. Both posterior cerebral arteries originate from the basilar tip. The PCA branch vessels are within normal limits. Venous sinuses: Dural sinuses are patent. The right transverse sinus is dominant. Straight sinus deep cerebral veins are intact. Cortical veins are unremarkable. Anatomic variants: None. Review of the MIP images confirms the above findings CT Brain Perfusion Findings: CBF (<30%) Volume: 41mL Perfusion (Tmax>6.0s) volume: 57mL A remote left occipital lobe infarct is noted. IMPRESSION: 1. Mild atherosclerotic changes at the  origin of the left subclavian artery and at the carotid bifurcations bilaterally without significant stenosis relative to the more distal vessels. 2. Atherosclerotic irregularity within the cavernous and precavernous internal carotid arteries bilaterally without significant stenosis. 3. Distal small vessel disease in the MCA branch vessels bilaterally without significant proximal stenosis, aneurysm, or branch vessel occlusion. 4. Remote left occipital lobe infarct. 5. Perfusion imaging demonstrates no acute infarct or significant ischemia. 6. Spondylosis of the cervical spine as described. Electronically Signed   By: San Morelle M.D.   On: 12/04/2017 20:47   Mr Brain Wo Contrast  Result Date: 12/05/2017 CLINICAL DATA:  Fall with gait abnormality.  Expressive aphasia. EXAM: MRI HEAD WITHOUT CONTRAST TECHNIQUE: Multiplanar, multiecho pulse sequences of the brain and surrounding structures were obtained without intravenous contrast. COMPARISON:  Head CT 12/04/2017 FINDINGS: Brain: The midline structures are normal. There is no acute infarct or acute hemorrhage. No mass lesion, hydrocephalus, dural abnormality or extra-axial collection. There is periventricular white matter hyperintensity consistent with chronic small vessel disease. Generalized volume loss with ex vacuo dilatation of the ventricles. No chronic microhemorrhage or superficial siderosis. Vascular: Major intracranial arterial and venous sinus flow voids are preserved. Skull and upper cervical spine: The visualized skull base, calvarium, upper cervical spine and extracranial soft tissues are normal. Sinuses/Orbits: No fluid levels or advanced mucosal thickening. No mastoid or middle ear effusion. Normal orbits. IMPRESSION: Generalized volume loss and sequelae of chronic ischemic microangiopathy without acute intracranial abnormality. Electronically Signed   By: Ulyses Jarred M.D.   On: 12/05/2017 20:00   Ct Cerebral  Perfusion W  Contrast  Result Date: 12/04/2017 CLINICAL DATA:  Focal neuro deficit for greater than 6 hours. EXAM: CT ANGIOGRAPHY HEAD AND NECK CT PERFUSION BRAIN TECHNIQUE: Multidetector CT imaging of the head and neck was performed using the standard protocol during bolus administration of intravenous contrast. Multiplanar CT image reconstructions and MIPs were obtained to evaluate the vascular anatomy. Carotid stenosis measurements (when applicable) are obtained utilizing NASCET criteria, using the distal internal carotid diameter as the denominator. Multiphase CT imaging of the brain was performed following IV bolus contrast injection. Subsequent parametric perfusion maps were calculated using RAPID software. CONTRAST:  140mL ISOVUE-370 IOPAMIDOL (ISOVUE-370) INJECTION 76% COMPARISON:  CT head without contrast from the same day. FINDINGS: CTA NECK FINDINGS Aortic arch: A 3 vessel arch configuration is present. There is minimal calcification at the origin of the left subclavian artery. Additional calcifications are present in the distal arch without aneurysm or stenosis. Right carotid system: The right common carotid artery is within normal limits. Atherosclerotic calcifications are present at the bifurcation. There is no significant stenosis. Cervical right ICA is within normal limits. Left carotid system: Is the left common carotid artery is within normal limits. Atherosclerotic changes are present at the bifurcation. There is no significant stenosis relative to the more distal vessel. The cervical left ICA is unremarkable. Vertebral arteries: The left vertebral artery is dominant to the right. Both vertebral arteries originate from the subclavian arteries without significant stenosis. There tortuosity in the proximal left vertebral artery. No stenoses are present in either vertebral artery throughout the neck. Skeleton: Grade 1 anterolisthesis is present at C3-4. There is chronic loss of disc height at C4-5, C5-6, and  C6-7. Other neck: The soft tissues the neck are otherwise unremarkable. Salivary glands are within normal limits bilaterally. No focal mucosal or submucosal lesions are present. The thyroid is within normal limits. There is no significant adenopathy. Upper chest: Interlobular septal thickening is present. There is no focal consolidation. Review of the MIP images confirms the above findings CTA HEAD FINDINGS Anterior circulation: Atherosclerotic irregularity is present in the cavernous and precavernous internal carotid arteries bilaterally. There is no significant stenosis through the ICA termini. The right A1 segment is hypoplastic. Anterior communicating artery is patent. Both A2 segments fill. M1 segments are within normal limits. MCA bifurcations are normal. Is small vessel attenuation is present in the MCA branch vessels bilaterally without a significant proximal stenosis or occlusion. Posterior circulation: The left vertebral artery is the dominant vessel. PICA origins are visualized and normal bilaterally. The basilar artery is within normal limits. Both posterior cerebral arteries originate from the basilar tip. The PCA branch vessels are within normal limits. Venous sinuses: Dural sinuses are patent. The right transverse sinus is dominant. Straight sinus deep cerebral veins are intact. Cortical veins are unremarkable. Anatomic variants: None. Review of the MIP images confirms the above findings CT Brain Perfusion Findings: CBF (<30%) Volume: 44mL Perfusion (Tmax>6.0s) volume: 35mL A remote left occipital lobe infarct is noted. IMPRESSION: 1. Mild atherosclerotic changes at the origin of the left subclavian artery and at the carotid bifurcations bilaterally without significant stenosis relative to the more distal vessels. 2. Atherosclerotic irregularity within the cavernous and precavernous internal carotid arteries bilaterally without significant stenosis. 3. Distal small vessel disease in the MCA branch  vessels bilaterally without significant proximal stenosis, aneurysm, or branch vessel occlusion. 4. Remote left occipital lobe infarct. 5. Perfusion imaging demonstrates no acute infarct or significant ischemia. 6. Spondylosis of the cervical spine as described.  Electronically Signed   By: San Morelle M.D.   On: 12/04/2017 20:47     Labs:   Basic Metabolic Panel: Recent Labs  Lab 12/04/17 1915 12/04/17 1935 12/04/17 2350 12/05/17 0729 12/07/17 0531  NA 139 140  --  139 141  K 3.3* 3.4*  --  3.2* 3.3*  CL 106 105  --  108 106  CO2 23  --   --  22 21*  GLUCOSE 105* 104*  --  88 103*  BUN 10 12  --  5* 9  CREATININE 0.69 0.60 0.67 0.71 0.73  CALCIUM 9.1  --   --  8.8* 9.1  MG  --   --   --   --  1.8   GFR Estimated Creatinine Clearance: 52.5 mL/min (by C-G formula based on SCr of 0.73 mg/dL). Liver Function Tests: Recent Labs  Lab 12/04/17 1915 12/05/17 0729  AST 22 21  ALT 15 15  ALKPHOS 47 44  BILITOT 0.5 0.8  PROT 6.6 6.2*  ALBUMIN 3.6 3.3*   No results for input(s): LIPASE, AMYLASE in the last 168 hours. No results for input(s): AMMONIA in the last 168 hours. Coagulation profile Recent Labs  Lab 12/04/17 2020  INR 1.13    CBC: Recent Labs  Lab 12/04/17 1915 12/04/17 1935 12/04/17 2350 12/05/17 0729 12/07/17 0531  WBC 8.7  --  7.1 6.3 9.1  NEUTROABS 6.2  --   --   --   --   HGB 11.6* 11.9* 11.6* 11.1* 11.6*  HCT 35.3* 35.0* 35.0* 34.4*  34.0 35.8*  MCV 85.7  --  85.0 85.6 86.1  PLT 266  --  257 247 254   Cardiac Enzymes: No results for input(s): CKTOTAL, CKMB, CKMBINDEX, TROPONINI in the last 168 hours. BNP: Invalid input(s): POCBNP CBG: Recent Labs  Lab 12/04/17 1900  GLUCAP 103*   D-Dimer No results for input(s): DDIMER in the last 72 hours. Hgb A1c Recent Labs    12/04/17 2350  HGBA1C 5.4   Lipid Profile Recent Labs    12/05/17 0729  CHOL 168  HDL 45  LDLCALC 114*  TRIG 46  CHOLHDL 3.7   Thyroid function  studies Recent Labs    12/05/17 0729  TSH 2.537   Anemia work up Recent Labs    12/05/17 0729  [id number]   Microbiology No results found for this or any previous visit (from the past 240 hour(s)).   Discharge Instructions:   Discharge Instructions    Diet - low sodium heart healthy   Complete by:  As directed    Increase activity slowly   Complete by:  As directed      Allergies as of 12/07/2017      Reactions   Baclofen Other (See Comments)   Caused brain dysfunction   Keflex [cephalexin] Rash   Macrodantin [nitrofurantoin] Rash   Neggram [nalidixic Acid] Rash   Penicillins Rash   Has patient had a PCN reaction causing immediate rash, facial/tongue/throat swelling, SOB or lightheadedness with hypotension: Yes Has patient had a PCN reaction causing severe rash involving mucus membranes or skin necrosis: No Has patient had a PCN reaction that required hospitalization: No Has patient had a PCN reaction occurring within the last 10 years: No If all of the above answers are "NO", then may proceed with Cephalosporin use.   Sulfa Antibiotics Rash      Medication List    STOP taking these medications   baclofen 10 MG tablet Commonly  known as:  LIORESAL   Diclofenac Sodium CR 100 MG 24 hr tablet Commonly known as:  VOLTAREN-XR   polyethylene glycol powder powder Commonly known as:  GLYCOLAX/MIRALAX     TAKE these medications   DUREZOL 0.05 % Emul Generic drug:  Difluprednate Place 1 drop into the left eye See admin instructions. Instill one drop into left eye twice daily for 3 weeks starting after surgery (surgery date 11/23/17)   lidocaine 5 % Commonly known as:  LIDODERM Place 1 patch onto the skin daily. Remove & Discard patch within 12 hours or as directed by MD   multivitamin with minerals Tabs tablet Take 1 tablet by mouth daily.   ofloxacin 0.3 % ophthalmic solution Commonly known as:  OCUFLOX Place 1 drop into the left eye See admin instructions.  Instill one drop into left eye twice daily starting 3 days prior to surgery and continuing for 3 weeks after surgery (surgery date 11/23/17)   pravastatin 20 MG tablet Commonly known as:  PRAVACHOL Take 1 tablet (20 mg total) by mouth daily at 6 PM.   vitamin B-12 250 MCG tablet Commonly known as:  CYANOCOBALAMIN Take 1 tablet (250 mcg total) by mouth daily. Start taking on:  12/08/2017      Follow-up Information    Star Age, MD. Go on 12/13/2017.   Specialties:  Neurology, Radiology Why:  2 PM Contact information: [place] [id number] [phone]        Kelton Pillar, MD Follow up in 1 week(s).   Specialty:  Family Medicine Why:  after d/c from CIR Contact information: [address] [place] [address] [phone]            Time coordinating discharge: 35 min  Signed:  Geradine Girt   [facility] 12/07/2017, 2:44 PM

## 2017-12-07 NOTE — PMR Pre-admission (Signed)
PMR Admission Coordinator Pre-Admission Assessment  Patient: Tammy Arnold is an 77 y.o., female MRN: [id number] DOB: [birth year] Height: 5\' 4"  (162.6 cm) Weight: 56.7 kg (125 lb)              Insurance Information HMO:     PPO:      PCP:      IPA:      80/20: yes     OTHER: no HMO PRIMARY: Medicare a and b      Policy#: [id number]      Subscriber: pt Benefits:  Phone #: online     Name: 12/06/2017 Eff. Date: a 12/24/2006 b 10/25/2016     Deduct: $1340      Out of Pocket Max: none      Life Max: none CIR: 100%      SNF: 20 full days Outpatient: 80%     Co-Pay: 20% Home Health: 100%      Co-Pay: none DME: 80%     Co-Pay: 20% Providers: pt choice  SECONDARY: [employer]      Policy#: [id number]      Subscriber: pt  Medicaid Application Date:       Case Manager:  Disability Application Date:       Case Worker:   Emergency [place]    Name Relation Home Work [place] [phone]     Current Medical History  Patient Admitting Diagnosis:  Debility, Balint syndrome  History of Present Illness:  HPI: Tammy Arnold a 77 y.o.femalewith history of HTN, progressive cognitive decline over 2-3 years,  blurry vision -- recent cataract surgery left eye.She was admitted on 2/10/19withfall and difficulty talking. CThead was suggestive of cerebellar abnormality. CTAhead/neck showed atherosclerotic changes with distal small vessel disease and remote left occipital lobe infarct --no perfusion deficits. MRI brain showed generalized volume loss with ex vacuo dilation of ventricles and sequelae of chronic ischemic microangiopathy.  She continued to have issues with confusion, agitation and visual disturbance.  Dr. Erlinda Hong felt that symptoms consistent with Balint syndrome as patient with anomia, impaired cognition, optic ataxia, oculomotor apraxia and simultanagnosia. EEG done revealing moderate generalized slowing  question due to dementia, toxic/metabolic encephalopathy or infectious etiology.   Low grade fever with chest xray clear, U/A with many bacteria-patient has multiple allergies, will treat with one dose fosfomycin form acute hospital. No other cause of fever with no sign of DVT/no headache/no meningeal symptoms, no sinus issues, no sore throat. Found to have use of KPAD for back pain.   Patient complaints of back pain. Myelogram in 2016 and MRI in 2017 with no acute lumbar spine fracture or malalignment. Levoscoliosis better seen on today's xray. Degenerative lumbar spine without canal stenosis. Neural foraminal narrowing L2-3 through L5-S1. Moderate to severe on the right at L5-S1. Low grade LEFT paraspinal muscle strain. Question form inactive in bed.   Dementia workup with RPR negative, B12 lower end of normal but replace. Folate normal, TSH normal. Has an appointment at [facility] 12/13/17.     Past Medical History  Past Medical History:  Diagnosis Date  . Arthritis   . High cholesterol   . Hypertension     Family History  family history includes Dementia in her mother.  Prior Rehab/Hospitalizations:  Has the patient had major surgery during 100 days prior to admission? Yes  Current Medications   Current Facility-Administered Medications:  .  acetaminophen (TYLENOL) tablet 650 mg, 650 mg, Oral, Q4H  PRN, 650 mg at 12/06/17 1022 **OR** acetaminophen (TYLENOL) solution 650 mg, 650 mg, Per Tube, Q4H PRN **OR** acetaminophen (TYLENOL) suppository 650 mg, 650 mg, Rectal, Q4H PRN, Rise Patience, MD .  aspirin EC tablet 81 mg, 81 mg, Oral, Daily, Rosalin Hawking, MD, 81 mg at 12/07/17 1040 .  enoxaparin (LOVENOX) injection 40 mg, 40 mg, Subcutaneous, Q24H, Rise Patience, MD, 40 mg at 12/07/17 1042 .  fosfomycin (MONUROL) packet 3 g, 3 g, Oral, Once, Vann, Jessica U, DO .  lidocaine (LIDODERM) 5 % 1 patch, 1 patch, Transdermal, Q24H, Vann, Jessica U, DO, 1 patch at 12/06/17 1822 .   multivitamin with minerals tablet 1 tablet, 1 tablet, Oral, Daily, Vann, Jessica U, DO .  ofloxacin (OCUFLOX) 0.3 % ophthalmic solution 1 drop, 1 drop, Left Eye, BID, Rise Patience, MD, 1 drop at 12/07/17 1042 .  pravastatin (PRAVACHOL) tablet 20 mg, 20 mg, Oral, q1800, Rosalin Hawking, MD, 20 mg at 12/06/17 1733 .  prednisoLONE acetate (PRED FORTE) 1 % ophthalmic suspension 1 drop, 1 drop, Left Eye, BID, Rise Patience, MD, 1 drop at 12/07/17 1043 .  vitamin B-12 (CYANOCOBALAMIN) tablet 250 mcg, 250 mcg, Oral, Daily, Eulogio Bear U, DO, 250 mcg at 12/07/17 1038  Patients Current Diet: Fall precautions Diet Heart Room service appropriate? Yes; Fluid consistency: Thin Diet - low sodium heart healthy  Precautions / Restrictions Precautions Precautions: Fall Restrictions Weight Bearing Restrictions: No   Has the patient had 2 or more falls or a fall with injury in the past year?No  Prior Activity Level Community (5-7x/wk): Independent without AD. cognition declined over past 2 years.(Drove until 3 to 4 weeks ago)  Development worker, international aid / [place] Devices/Equipment: None Home Equipment: None  Prior Device Use: Indicate devices/aids used by the patient prior to current illness, exacerbation or injury? None of the above  Prior Functional Level Prior Function Level of Independence: Independent(Independent with adls until Sunday pta) Gait / Transfers Assistance Needed: walked independently pta, bathed self ADL's / Homemaking Assistance Needed: spouse reports he did not assist with adls pta Comments: basline confirmed with spouse  Self Care: Did the patient need help bathing, dressing, using the toilet or eating?  Independent  Indoor Mobility: Did the patient need assistance with walking from room to room (with or without device)? Independent  Stairs: Did the patient need assistance with internal or external stairs (with or without device)?  Independent  Functional Cognition: Did the patient need help planning regular tasks such as shopping or remembering to take medications? Needed some help  Current Functional Level Cognition  Arousal/Alertness: Awake/alert Overall Cognitive Status: Impaired/Different from baseline Current Attention Level: Sustained Orientation Level: Oriented X4 Following Commands: Follows one step commands consistently Safety/Judgement: Decreased awareness of safety, Decreased awareness of deficits General Comments: as pt fatigues and pain increases cognition declines.  Pt very restless today with c/o significant back pain which appears to significantly hinder her ability to consistently sustain attention to tasks - affect different than yesterday  Attention: Sustained Sustained Attention: Impaired Sustained Attention Impairment: Verbal basic, Functional basic Memory: Impaired Memory Impairment: Retrieval deficit, Decreased recall of new information, Decreased short term memory, Prospective memory, Storage deficit, Decreased long term memory Decreased Long Term Memory: Verbal basic, Functional basic Decreased Short Term Memory: Verbal basic, Functional basic Awareness: Appears intact Problem Solving: Impaired Problem Solving Impairment: Verbal basic, Functional basic Executive Function: (all areas impacted by lower level deficits) Behaviors: Restless Safety/Judgment: Impaired    Extremity  Assessment (includes Sensation/Coordination)  Upper Extremity Assessment: RUE deficits/detail, LUE deficits/detail RUE Deficits / Details: dysmetria noted  RUE Coordination: decreased fine motor LUE Deficits / Details: dysmetria noted  LUE Coordination: decreased fine motor  Lower Extremity Assessment: Defer to PT evaluation    ADLs  Overall ADL's : Needs assistance/impaired Eating/Feeding: Minimal assistance, Sitting Grooming: Wash/dry hands, Wash/dry face, Minimal assistance, Standing Grooming Details  (indicate cue type and reason): Pt required tactile cues/assist to initiate washing hands and to move through the sequence of doing so, but was able to do so with multimodal cues, and occasional assist  Upper Body Bathing: Moderate assistance, Sitting Lower Body Bathing: Maximal assistance, Sit to/from stand Lower Body Bathing Details (indicate cue type and reason): Pt's IV dislodged when she was moving OOB.  Pt with signifcant bleeding, and assisted with clean up.  Pt demonstrated difficulty targeting specific area to bathe.  She required tactile cues to bathe area, and noted to perseverate on washing her feet.  Pt with back pain and fatigue which further limited her ability to concentrate on task  Upper Body Dressing : Moderate assistance, Sitting Upper Body Dressing Details (indicate cue type and reason): Pt requires mod cues to target sleeve and thread UEs through sleeve  Lower Body Dressing: Minimal assistance Lower Body Dressing Details (indicate cue type and reason): performed socks only - she required min A to don Lt sock - appeared limited by back pain  Toilet Transfer: Minimal assistance, Ambulation, Comfort height toilet, Grab bars Toilet Transfer Details (indicate cue type and reason): Pt ambulated to BR with min HHA to help her navigate unfamiliar environment and for balance  Toileting- Clothing Manipulation and Hygiene: Minimal assistance, Sit to/from stand Toileting - Clothing Manipulation Details (indicate cue type and reason): Pt able to maneuver gown with min A for balance.  Pt began to clean peri area, then proceeded to reach to floor and wipe floor.  tactile and verbal cues to redirect behavior, then pt able to complete task successfully  Functional mobility during ADLs: Minimal assistance, Moderate assistance    Mobility  Overal bed mobility: Needs Assistance Bed Mobility: Sidelying to Sit, Sit to Supine Sidelying to sit: Min assist Supine to sit: Min assist Sit to supine: Mod  assist General bed mobility comments: Pt indicating severe back pain in sidelying.  She was assisted to EOB, but she demonstrated difficulty problem solving through activity due to distraction from pain.  Assist provided to guide LEs off the bed and to lift his shoulders     Transfers  Overall transfer level: Needs assistance Equipment used: 1 person hand held assist Transfers: Sit to/from Stand, Stand Pivot Transfers Sit to Stand: Min assist Stand pivot transfers: Min assist General transfer comment: assist to steady     Ambulation / Gait / Stairs / Wheelchair Mobility  Ambulation/Gait Ambulation/Gait assistance: Mod assist Ambulation Distance (Feet): 10 Feet Assistive device: 1 person hand held assist Gait Pattern/deviations: Step-to pattern, Narrow base of support, Scissoring General Gait Details: Slow, unsteady ambulation with HHA and modA to maintain balance; pt also reaching for bilat UE support on furniture. Pt with narrow BOS unable to self-correct, requiring cues. Told to sit in recliner, pt attempting to sit in chair next to husband; unable to scan in order to find recliner or follow directional tasks in order to get to it; required physical guidance to chair Gait velocity: Decreased Gait velocity interpretation: <1.8 ft/sec, indicative of risk for recurrent falls    Posture / Balance Dynamic  Sitting Balance Sitting balance - Comments: Pt able to sit EOB and EOC with min guard assist.  She was able to reach down to feet to wash and don socks without LOB  Balance Overall balance assessment: Needs assistance Sitting-balance support: Feet supported, Bilateral upper extremity supported Sitting balance-Leahy Scale: Fair Sitting balance - Comments: Pt able to sit EOB and EOC with min guard assist.  She was able to reach down to feet to wash and don socks without LOB  Postural control: Posterior lean Standing balance support: Single extremity supported Standing balance-Leahy Scale:  Poor Standing balance comment: requires min A and UE support with an occasional LOB to the Rt requiring mod A to recover     Special needs/care consideration BiPAP/CPAP  N/a CPM  N/a Continuous Drip IV  N/a Dialysis  N/a Life Vest  N/a Oxygen  N/a Special Bed n/a Trach Size  N/a Wound VAC n/a Skin ecchymosis to BUE, ecchymosis to medial back area Bowel mgmt: LBM 2/10 Bladder mgmt: some incontinence; new this admission per spouse; some retention noted with I and O cath performed Diabetic mgmt n/a Sitter at bedside on acute with tele sitter then sitter due to decreased safety awareness and impulsiveness   Previous Home Environment Living Arrangements: Spouse/significant other  Lives With: Spouse Available Help at Discharge: Family, Available 24 hours/day(spouse will hire 24/7 assist as needed) Type of Home: House Home Layout: Two level, Able to live on main level with bedroom/bathroom Alternate Level Stairs-Number of Steps: flight Home Access: Stairs to enter [employer] of Steps: 4 to 5 Bathroom Shower/Tub: Tub/shower unit, Multimedia programmer: Standard Bathroom Accessibility: Yes How Accessible: Accessible via walker Home Care Services: No Additional Comments: verified with spouse  Discharge Living Setting Plans for Discharge Living Setting: [name] home, Lives with (comment)(spouse) Type of Home at Discharge: House Discharge Home Layout: Two level, Able to live on main level with bedroom/bathroom Alternate Level Stairs-Number of Steps: flight Discharge Home Access: Stairs to enter Entrance Stairs-Number of Steps: 4 to 5 Discharge Bathroom Shower/Tub: Tub/shower unit, Horticulturist, commercial: Standard Discharge Bathroom Accessibility: Yes How Accessible: Accessible via walker Does the patient have any problems obtaining your medications?: No  Social/Family/Support Systems Patient Roles: Spouse, Parent Contact Information:  Debbie Yearick, spouse Anticipated Caregiver: spouse and hired caregivers as needed Anticipated Ambulance person Information: see above Ability/Limitations of Caregiver: spouse works as GYN MD on [employer], Wed and Friday Caregiver Availability: 24/7 Discharge Plan Discussed with Primary Caregiver: Yes Is Caregiver In Agreement with Plan?: Yes Does Caregiver/Family have Issues with Lodging/Transportation while Pt is in Rehab?: No   Goals/Additional Needs Patient/Family Goal for Rehab: Min assist with PT, OT, and S:LP Expected length of stay: ELOS 14 to 17 days Special Service Needs: new diagnosis this admission. Cognitively impaired mild until Sunday before admit. Gradual decline(Independent with adls pta) Additional Information: Since admit, pt with severe back pain. pt was not on any pain meds pta. chronic lumbar stenosis Pt/Family Agrees to Admission and willing to participate: Yes Program Orientation Provided & Reviewed with Pt/Caregiver Including Roles  & Responsibilities: Yes  Barriers to Discharge: Behavior  Spouse reports slow cognitive decline over past year as pt received one dose of baclofen in ED at that time. Played Bridge 3 times per week until last few months until she made too many mistakes. Drove until 3 to 4 weeks ago. Independent without AD but walked somewhat slow and guarded due to Lumbar stenosis. Functionally ambulatory until Sunday before admit  when found down on floor and then fell again from sited chair.   Decrease burden of Care through IP rehab admission: n/a  Possible need for SNF placement upon discharge: if pt does not reach min assist level of one caregiver support with spouse and hired caregivers, he would look into some SNF rehab. His goal is to have her return home.  Patient Condition: This patient's medical and functional status has changed since the consult dated 12/05/2017 in which the Rehabilitation Physician determined and documented that the patient was  potentially appropriate for intensive rehabilitative care in an inpatient rehabilitation facility. Issues have been addressed and update has been discussed with Dr. Posey Pronto and patient now appropriate for inpatient rehabilitation. Will admit to inpatient rehab today.   Preadmission Screen Completed By:  Cleatrice Burke, 12/07/2017 3:12 PM ______________________________________________________________________   Discussed status with Dr. Posey Pronto on 12/07/2017 at  1510 and received telephone approval for admission today.  Admission Coordinator:  Cleatrice Burke, time 1638 Date 12/07/2017

## 2017-12-07 NOTE — Consult Note (Signed)
[facility] CM Primary Care Navigator  12/07/2017  Tammy Arnold 28-Mar-1941 [id number]   Met with patient at the bedside to identify possible discharge needs butshe was unable to answer questions accurately. [name] safety sitter at bedside.  Patient was noted with memory impairment possibly due to dementia.Patient was in for TIA (transient ischemic attack). Per patient's RN, anticipated discharge plan is [facility]).  Primary care provider's office is listed as providing transition of care (TOC). Patient has discharge instruction to follow-up with primary care provider within 1 week after discharge from [place] ([facility]).   For additional questions please contact:  Edwena Felty A. Kellianne Ek, BSN, RN-BC [facility] PRIMARY CARE Navigator Cell: [phone]

## 2017-12-07 NOTE — H&P (Signed)
Physical Medicine and Rehabilitation Admission H&P    Chief Complaint  Patient presents with  .     HPI: Tammy Arnold is a 77 y.o. female with history of HTN, progressive cognitive decline over 2-3 years,  blurry vision -- recent cataract surgery left eye.  History taken from chart review. She was admitted on 12/04/17 with fall and difficulty talking. CT head was suggestive of cerebellar abnormality. CTA head/neck showed atherosclerotic changes with distal small vessel disease and remote left occipital lobe infarct --no perfusion deficits. MRI brain reviewed, showing generalized atrophy. Per report,generalized volume loss with ex vacuo dilation of ventricles and sequelae of chronic ischemic microangiopathy.  She continued to have issues with confusion, agitation and visual disturbance.  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 EEG done revealing moderate generalized slowing question due to dementia, toxic/metabolic encephalopathy or infectious etiology.   Review of Systems  Unable to perform ROS: Mental acuity   Past Medical History:  Diagnosis Date  . Arthritis   . High cholesterol   . Hypertension     Past Surgical History:  Procedure Laterality Date  . CATARACT EXTRACTION  11/23/2017  . REPLACEMENT TOTAL KNEE      Family History  Problem Relation Age of Onset  . Dementia Mother     Social History:  reports that  has never smoked. she has never used smokeless tobacco. She reports that she does not drink alcohol. Her drug history is not on file.     Allergies  Allergen Reactions  . Baclofen Other (See Comments)    Caused brain dysfunction  . Keflex [Cephalexin] Rash  . Macrodantin [Nitrofurantoin] Rash  . Neggram [Nalidixic Acid] Rash  . Penicillins Rash    Has patient had a PCN reaction causing immediate rash, facial/tongue/throat swelling, SOB or  lightheadedness with hypotension: Yes Has patient had a PCN reaction causing severe rash involving mucus membranes or skin necrosis: No Has patient had a PCN reaction that required hospitalization: No Has patient had a PCN reaction occurring within the last 10 years: No If all of the above answers are "NO", then may proceed with Cephalosporin use.  . Sulfa Antibiotics Rash    Medications Prior to Admission  Medication Sig Dispense Refill  . Difluprednate (DUREZOL) 0.05 % EMUL Place 1 drop into the left eye See admin instructions. Instill one drop into left eye twice daily for 3 weeks starting after surgery (surgery date 11/23/17)    . ofloxacin (OCUFLOX) 0.3 % ophthalmic solution Place 1 drop into the left eye See admin instructions. Instill one drop into left eye twice daily starting 3 days prior to surgery and continuing for 3 weeks after surgery (surgery date 11/23/17)  1  . baclofen (LIORESAL) 10 MG tablet Take 5 mg of baclofen in the morning and 10 mg at night as needed for muscle spasms. (Patient not taking: Reported on 12/04/2017) 30 each 0  . Diclofenac Sodium CR (VOLTAREN-XR) 100 MG 24 hr tablet Take 1 tablet (100 mg total) by mouth daily. (Patient not taking: Reported on 12/04/2017) 10 tablet 0  . polyethylene glycol powder (GLYCOLAX/MIRALAX) powder Take 17 g by mouth daily. (Patient not taking: Reported on 12/04/2017) 255 g 0    Drug Regimen Review  Drug regimen was reviewed and remains appropriate with no significant issues identified  Home: Home Living Family/patient expects to be discharged to:: Private residence Living Arrangements: Spouse/significant other  Available Help at Discharge: Family, Available PRN/intermittently Type of Home: House Home Access: Stairs to enter [employer] of Steps: pt unable to state  Home Layout: Two level Alternate Level Stairs-Number of Steps: Pt unable to state details but indicates they could live on main level  Home Equipment: Walker  - 2 wheels Additional Comments: spouse not present.  Pt only able to provide limited info   Lives With: Spouse   Functional History: Prior Function Level of Independence: Needs assistance Gait / Transfers Assistance Needed: Reports walking independently. Per notes, pt with multiple falls at home but pt endorses only 1 leading to admission. ADL's / Homemaking Assistance Needed: Per notes, spouse assists with ADLs. Per pt, spouse works during the day. Comments: Not sure of accuracy of pt's reported PLOF/history as no family members present during evaluation.  Functional Status:  Mobility: Bed Mobility Overal bed mobility: Needs Assistance Bed Mobility: Sidelying to Sit, Sit to Supine Sidelying to sit: Min assist Supine to sit: Min assist Sit to supine: Mod assist General bed mobility comments: Pt indicating severe back pain in sidelying.  She was assisted to EOB, but she demonstrated difficulty problem solving through activity due to distraction from pain.  Assist provided to guide LEs off the bed and to lift his shoulders  Transfers Overall transfer level: Needs assistance Equipment used: 1 person hand held assist Transfers: Sit to/from Stand, Stand Pivot Transfers Sit to Stand: Min assist Stand pivot transfers: Min assist General transfer comment: assist to steady  Ambulation/Gait Ambulation/Gait assistance: Mod assist Ambulation Distance (Feet): 10 Feet Assistive device: 1 person hand held assist Gait Pattern/deviations: Step-to pattern, Narrow base of support, Scissoring General Gait Details: Slow, unsteady ambulation with HHA and modA to maintain balance; pt also reaching for bilat UE support on furniture. Pt with narrow BOS unable to self-correct, requiring cues. Told to sit in recliner, pt attempting to sit in chair next to husband; unable to scan in order to find recliner or follow directional tasks in order to get to it; required physical guidance to chair Gait velocity:  Decreased Gait velocity interpretation: <1.8 ft/sec, indicative of risk for recurrent falls    ADL: ADL Overall ADL's : Needs assistance/impaired Eating/Feeding: Minimal assistance, Sitting Grooming: Wash/dry hands, Wash/dry face, Minimal assistance, Standing Grooming Details (indicate cue type and reason): Pt required tactile cues/assist to initiate washing hands and to move through the sequence of doing so, but was able to do so with multimodal cues, and occasional assist  Upper Body Bathing: Moderate assistance, Sitting Lower Body Bathing: Maximal assistance, Sit to/from stand Lower Body Bathing Details (indicate cue type and reason): Pt's IV dislodged when she was moving OOB.  Pt with signifcant bleeding, and assisted with clean up.  Pt demonstrated difficulty targeting specific area to bathe.  She required tactile cues to bathe area, and noted to perseverate on washing her feet.  Pt with back pain and fatigue which further limited her ability to concentrate on task  Upper Body Dressing : Moderate assistance, Sitting Upper Body Dressing Details (indicate cue type and reason): Pt requires mod cues to target sleeve and thread UEs through sleeve  Lower Body Dressing: Minimal assistance Lower Body Dressing Details (indicate cue type and reason): performed socks only - she required min A to don Lt sock - appeared limited by back pain  Toilet Transfer: Minimal assistance, Ambulation, Comfort height toilet, Grab bars Toilet Transfer Details (indicate cue type and reason): Pt ambulated to BR with min HHA to help her  navigate unfamiliar environment and for balance  Toileting- Clothing Manipulation and Hygiene: Minimal assistance, Sit to/from stand Toileting - Clothing Manipulation Details (indicate cue type and reason): Pt able to maneuver gown with min A for balance.  Pt began to clean peri area, then proceeded to reach to floor and wipe floor.  tactile and verbal cues to redirect behavior, then pt  able to complete task successfully  Functional mobility during ADLs: Minimal assistance, Moderate assistance  Cognition: Cognition Overall Cognitive Status: Impaired/Different from baseline Arousal/Alertness: Awake/alert Orientation Level: Oriented to person, Disoriented to time, Disoriented to situation, Disoriented to place Attention: Sustained Sustained Attention: Impaired Sustained Attention Impairment: Verbal basic, Functional basic Memory: Impaired Memory Impairment: Retrieval deficit, Decreased recall of new information, Decreased short term memory, Prospective memory, Storage deficit, Decreased long term memory Decreased Long Term Memory: Verbal basic, Functional basic Decreased Short Term Memory: Verbal basic, Functional basic Awareness: Appears intact Problem Solving: Impaired Problem Solving Impairment: Verbal basic, Functional basic Executive Function: (all areas impacted by lower level deficits) Behaviors: Restless Safety/Judgment: Impaired Cognition Arousal/Alertness: Awake/alert Behavior During Therapy: Restless Overall Cognitive Status: Impaired/Different from baseline Area of Impairment: Orientation, Attention, Memory, Following commands, Safety/judgement, Awareness, Problem solving Orientation Level: Disoriented to, Time, Place Current Attention Level: Sustained Memory: Decreased short-term memory Following Commands: Follows one step commands consistently Safety/Judgement: Decreased awareness of safety, Decreased awareness of deficits Awareness: Intellectual Problem Solving: Decreased initiation, Difficulty sequencing, Requires verbal cues, Requires tactile cues General Comments: as pt fatigues and pain increases cognition declines.  Pt very restless today with c/o significant back pain which appears to significantly hinder her ability to consistently sustain attention to tasks - affect different than yesterday   Physical Exam: Blood pressure (!) 148/59, pulse  80, temperature 99.1 F (37.3 C), temperature source Oral, resp. rate 18, height '5\' 4"'  (1.626 m), weight 56.7 kg (125 lb), SpO2 98 %. Physical Exam  Nursing note and vitals reviewed. Constitutional: She appears well-developed and well-nourished. No distress.  Lying in bed with eyes closed and sheets pulled to chin. Behaviorally worse today with refusal to participate in exam.   HENT:  Head: Normocephalic and atraumatic.  Eyes: Right eye exhibits no discharge. Left eye exhibits no discharge.  Refused to open her eyes--limited exam due to agitated behaviors.   Neck: Normal range of motion. Neck supple.  Cardiovascular: Normal rate and regular rhythm.  Respiratory: Effort normal and breath sounds normal. No stridor. No respiratory distress.  GI: Soft. Bowel sounds are normal. She exhibits no distension.  Musculoskeletal:  No edema or tenderness in extremities  Neurological: She is alert.  Alert--but kept eyes closed.  Slumped to the right side of bed --restless at times.   Verbal output Was able to state name as Norva but able to surname with choice of two.  Spontaneously moving all extremities, however not willing to participate in MMT  Skin: Skin is warm and dry. She is not diaphoretic.  Psychiatric: Her affect is blunt. She is combative. Thought content is paranoid. Cognition and memory are impaired. She expresses inappropriate judgment. She is noncommunicative. She is inattentive.    Results for orders placed or performed during the hospital encounter of 12/04/17 (from the past 48 hour(s))  CBC     Status: Abnormal   Collection Time: 12/07/17  5:31 AM  Result Value Ref Range   WBC 9.1 4.0 - 10.5 K/uL   RBC 4.16 3.87 - 5.11 MIL/uL   Hemoglobin 11.6 (L) 12.0 - 15.0 g/dL   HCT 35.8 (  L) 36.0 - 46.0 %   MCV 86.1 78.0 - 100.0 fL   MCH 27.9 26.0 - 34.0 pg   MCHC 32.4 30.0 - 36.0 g/dL   RDW 13.6 11.5 - 15.5 %   Platelets 254 150 - 400 K/uL    Comment: Performed at [facility], [place] [address] [place], [place] [address]  Basic metabolic panel     Status: Abnormal   Collection Time: 12/07/17  5:31 AM  Result Value Ref Range   Sodium 141 135 - 145 mmol/L   Potassium 3.3 (L) 3.5 - 5.1 mmol/L   Chloride 106 101 - 111 mmol/L   CO2 21 (L) 22 - 32 mmol/L   Glucose, Bld 103 (H) 65 - 99 mg/dL   BUN 9 6 - 20 mg/dL   Creatinine, Ser 0.73 0.44 - 1.00 mg/dL   Calcium 9.1 8.9 - 10.3 mg/dL   GFR calc non Af Amer >60 >60 mL/min   GFR calc Af Amer >60 >60 mL/min    Comment: (NOTE) The eGFR has been calculated using the CKD EPI equation. This calculation has not been validated in all clinical situations. eGFR's persistently <60 mL/min signify possible Chronic Kidney Disease.    Anion gap 14 5 - 15    Comment: Performed at [facility] [address], [place], [place] [address]  Magnesium     Status: None   Collection Time: 12/07/17  5:31 AM  Result Value Ref Range   Magnesium 1.8 1.7 - 2.4 mg/dL    Comment: Performed at [facility] [address] [place], [place] [address]  Uric acid     Status: None   Collection Time: 12/07/17  5:31 AM  Result Value Ref Range   Uric Acid, Serum 2.9 2.3 - 6.6 mg/dL    Comment: Performed at [facility] [address], [place], [place] [address]   Mr Brain Wo Contrast  Result Date: 12/05/2017 CLINICAL DATA:  Fall with gait abnormality.  Expressive aphasia. EXAM: MRI HEAD WITHOUT CONTRAST TECHNIQUE: Multiplanar, multiecho pulse sequences of the brain and surrounding structures were obtained without intravenous contrast. COMPARISON:  Head CT 12/04/2017 FINDINGS: Brain: The midline structures are normal. There is no acute infarct or acute hemorrhage. No mass lesion, hydrocephalus, dural abnormality or extra-axial collection. There is periventricular white matter hyperintensity consistent with chronic small vessel disease. Generalized volume loss with ex vacuo dilatation of the ventricles. No chronic microhemorrhage or  superficial siderosis. Vascular: Major intracranial arterial and venous sinus flow voids are preserved. Skull and upper cervical spine: The visualized skull base, calvarium, upper cervical spine and extracranial soft tissues are normal. Sinuses/Orbits: No fluid levels or advanced mucosal thickening. No mastoid or middle ear effusion. Normal orbits. IMPRESSION: Generalized volume loss and sequelae of chronic ischemic microangiopathy without acute intracranial abnormality. Electronically Signed   By: Ulyses Jarred M.D.   On: 12/05/2017 20:00   Dg Chest Port 1 View  Result Date: 12/07/2017 CLINICAL DATA:  Fever EXAM: PORTABLE CHEST 1 VIEW COMPARISON:  None. FINDINGS: Right rotated chest radiograph. Normal heart size. Normal mediastinal contour. No pneumothorax. No pleural effusion. Lungs appear clear, with no acute consolidative airspace disease and no pulmonary edema. IMPRESSION: No active disease. Electronically Signed   By: Ilona Sorrel M.D.   On: 12/07/2017 08:28       Medical Problem List and Plan: 1.  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 2.  DVT Prophylaxis/Anticoagulation: Pharmaceutical:  Lovenox 3. Severe low back pain/Pain Management: tylenol prn 4. Mood: Team to provide ego support. LCSW to follow with patient and husband for support.  5. Neuropsych: This patient is not capable of making decisions on her own behalf. 6. Skin/Wound Care: routine pressure relief measures.  7. Fluids/Electrolytes/Nutrition: Monitor I/O. Offer supplements. Assist with meals due to visual deficits. 8. HTN: Monitor BP bid for trends--may need agent for better control.   9. Fever: Tmax 100.7 on 01/12 and low grade fevers continue.  BC X 2 pending. UCS with multispecies. Treated with fosfomycin today?   Post Admission Physician Evaluation: 1. Preadmission assessment reviewed and changes made below. 2. 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 3. Patient  is admitted to receive collaborative, interdisciplinary care between the physiatrist, rehab nursing staff, and therapy team. 4. [name] level of medical complexity and substantial therapy needs in context of that medical necessity cannot be provided at a lesser intensity of care such as a SNF. 5. Patient has experienced substantial functional loss from his/her baseline which was documented above under the "Functional History" and "Functional Status" headings.  Judging by the patient's diagnosis, physical exam, and functional history, the patient has potential for functional progress which will result in measurable gains while on inpatient rehab.  These gains will be of substantial and practical use upon discharge  in facilitating mobility and self-care at the household level. 64. Physiatrist will provide 24 hour management of medical needs as well as oversight of the therapy plan/treatment and provide guidance as appropriate regarding the interaction of the two. 7. 24 hour rehab nursing will assist with bladder management, bowel management, safety, skin/wound care, disease management, medication administration, pain management and patient education  and help integrate therapy concepts, techniques,education, etc. 8. PT will assess and treat for/with: Lower extremity strength, range of motion, stamina, balance, functional mobility, safety, adaptive techniques and equipment, woundcare, coping skills, pain control, family education.   Goals are: Min assist. 9. OT will assess and treat for/with: ADL's, functional mobility, safety, upper extremity strength, adaptive techniques and equipment, wound mgt, ego support, and community reintegration.   Goals are: Min assist. Therapy may proceed with showering this patient. 10. SLP will assess and treat for/with: Cognition, swallowing, speech.  Goals are: Mod/Max A. 11. Case Management and Social Worker will assess and treat for psychological issues and discharge  planning. 12. Team conference will be held weekly to assess progress toward goals and to determine barriers to discharge. 13. Patient will receive at least 3 hours of therapy per day at least 5 days per week. 14. ELOS: 16-20 days.       15. Prognosis:  fair  Delice Lesch, MD, ABPMR Bary Leriche, PA-C 12/07/2017

## 2017-12-08 ENCOUNTER — Inpatient Hospital Stay (HOSPITAL_COMMUNITY): Payer: Medicare Other | Admitting: Physical Therapy

## 2017-12-08 ENCOUNTER — Inpatient Hospital Stay (HOSPITAL_COMMUNITY): Payer: Medicare Other | Admitting: Speech Pathology

## 2017-12-08 ENCOUNTER — Inpatient Hospital Stay (HOSPITAL_COMMUNITY): Payer: Medicare Other | Admitting: Occupational Therapy

## 2017-12-08 ENCOUNTER — Encounter (HOSPITAL_COMMUNITY): Payer: Medicare Other

## 2017-12-08 DIAGNOSIS — E876 Hypokalemia: Secondary | ICD-10-CM

## 2017-12-08 DIAGNOSIS — H538 Other visual disturbances: Secondary | ICD-10-CM

## 2017-12-08 DIAGNOSIS — R410 Disorientation, unspecified: Secondary | ICD-10-CM

## 2017-12-08 DIAGNOSIS — R509 Fever, unspecified: Secondary | ICD-10-CM

## 2017-12-08 LAB — COMPREHENSIVE METABOLIC PANEL
ALT: 12 U/L — ABNORMAL LOW (ref 14–54)
ANION GAP: 11 (ref 5–15)
AST: 23 U/L (ref 15–41)
Albumin: 3.3 g/dL — ABNORMAL LOW (ref 3.5–5.0)
Alkaline Phosphatase: 49 U/L (ref 38–126)
BUN: 11 mg/dL (ref 6–20)
CO2: 24 mmol/L (ref 22–32)
Calcium: 8.9 mg/dL (ref 8.9–10.3)
Chloride: 105 mmol/L (ref 101–111)
Creatinine, Ser: 0.69 mg/dL (ref 0.44–1.00)
GFR calc non Af Amer: >60 mL/min (ref >60)
Glucose, Bld: 116 mg/dL — ABNORMAL HIGH (ref 65–99)
POTASSIUM: 3.7 mmol/L (ref 3.5–5.1)
SODIUM: 140 mmol/L (ref 135–145)
TOTAL PROTEIN: 6.7 g/dL (ref 6.5–8.1)
Total Bilirubin: 1.1 mg/dL (ref 0.3–1.2)

## 2017-12-08 LAB — CBC WITH DIFFERENTIAL/PLATELET
BASOS PCT: 0 %
Basophils Absolute: 0 10*3/uL (ref 0.0–0.1)
EOS ABS: 0 10*3/uL (ref 0.0–0.7)
EOS PCT: 0 %
HCT: 36.3 % (ref 36.0–46.0)
Hemoglobin: 12.1 g/dL (ref 12.0–15.0)
Lymphocytes Relative: 17 %
Lymphs Abs: 1.7 10*3/uL (ref 0.7–4.0)
MCH: 28.3 pg (ref 26.0–34.0)
MCHC: 33.3 g/dL (ref 30.0–36.0)
MCV: 85 fL (ref 78.0–100.0)
MONO ABS: 0.7 10*3/uL (ref 0.1–1.0)
MONOS PCT: 7 %
Neutro Abs: 8 10*3/uL — ABNORMAL HIGH (ref 1.7–7.7)
Neutrophils Relative %: 76 %
Platelets: 269 10*3/uL (ref 150–400)
RBC: 4.27 MIL/uL (ref 3.87–5.11)
RDW: 13.7 % (ref 11.5–15.5)
WBC: 10.5 10*3/uL (ref 4.0–10.5)

## 2017-12-08 NOTE — Progress Notes (Signed)
Patient information reviewed and entered into eRehab system by Rejeana Fadness, RN, CRRN, PPS Coordinator.  Information including medical coding and functional independence measure will be reviewed and updated through discharge.     Per nursing patient was given "Data Collection Information Summary for Patients in Inpatient Rehabilitation Facilities with attached "Privacy Act Statement-Health Care Records" upon admission.  

## 2017-12-08 NOTE — Progress Notes (Signed)
[facility] PHYSICAL MEDICINE & REHABILITATION     PROGRESS NOTE    Subjective/Complaints: No major issues reported overnight.  Patient is in enclosure bed.  ROS: Unable to obtain due to cognitive/mental status issues.    Objective: Vital Signs: Blood pressure (!) 158/60, pulse 88, temperature 98.5 F (36.9 C), temperature source Oral, resp. rate 20, SpO2 98 %. Dg Lumbar Spine 2-3 Views  Result Date: 12/07/2017 CLINICAL DATA:  Low back pain EXAM: LUMBAR SPINE - 2-3 VIEW COMPARISON:  MRI lumbar spine 12/08/2015 FINDINGS: Five non-rib-bearing lumbar vertebra. Partial sacralization of L5 vertebral body. Biconvex thoracolumbar scoliosis. Multilevel disc space narrowing and endplate spur formation. Vertebral body heights maintained without fracture or subluxation. No bone destruction. SI joints preserved. IMPRESSION: Degenerative disc and facet disease changes lumbar spine with associated scoliosis. No acute abnormalities. Electronically Signed   By: Lavonia Dana M.D.   On: 12/07/2017 12:32   Dg Chest Port 1 View  Result Date: 12/07/2017 CLINICAL DATA:  Fever EXAM: PORTABLE CHEST 1 VIEW COMPARISON:  None. FINDINGS: Right rotated chest radiograph. Normal heart size. Normal mediastinal contour. No pneumothorax. No pleural effusion. Lungs appear clear, with no acute consolidative airspace disease and no pulmonary edema. IMPRESSION: No active disease. Electronically Signed   By: Ilona Sorrel M.D.   On: 12/07/2017 08:28   Recent Labs    12/07/17 0531 12/08/17 0644  WBC 9.1 10.5  HGB 11.6* 12.1  HCT 35.8* 36.3  PLT 254 269   Recent Labs    12/07/17 0531  NA 141  K 3.3*  CL 106  GLUCOSE 103*  BUN 9  CREATININE 0.73  CALCIUM 9.1   CBG (last 3)  No results for input(s): GLUCAP in the last 72 hours.  Wt Readings from Last 3 Encounters:  12/04/17 56.7 kg (125 lb)  07/18/15 56.7 kg (125 lb)    Physical Exam:  Nursing note and vitals reviewed. Constitutional: She appears  well-developed and well-nourished. No distress.    Patient lies in bed with eyes closed. HENT:  Head: Normocephalic and atraumatic.  Eyes: Right eye exhibits no discharge. Left eye exhibits no discharge.    Neck: Normal range of motion. Neck supple.  Cardiovascular: RRR without murmur. No JVD   Respiratory: CTA Bilaterally without wheezes or rales. Normal effort   GI: Soft. Bowel sounds are normal. She exhibits no distension.  Musculoskeletal:  No edema or tenderness in extremities  Neurological: She is alert.    Keeps eyes closed.  Lays on her side in the bed.  Did not participate in exam although did move all 4 extremities.  Withdrew to pain. Skin: Skin is warm and dry. She is not diaphoretic.  Psychiatric: flat/blunt    Assessment/Plan: 1.  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 Physiatrist is providing close team supervision and 24 hour management of active medical problems listed below. Physiatrist and rehab team continue to assess barriers to discharge/monitor patient progress toward functional and medical goals.  Function:  Bathing Bathing position      Bathing parts      Bathing assist        Upper Body Dressing/Undressing Upper body dressing                    Upper body assist        Lower Body Dressing/Undressing Lower body dressing  Lower body assist        Toileting Toileting Toileting activity did not occur: No continent bowel/bladder event        Toileting assist     Transfers Chair/bed Clinical biochemist          Cognition Comprehension Comprehension assist level: Understands basic less than 25% of the time/ requires cueing >75% of the time  Expression Expression assist level: Expresses basis less than 25%  of the time/requires cueing >75% of the time.  Social Interaction Social Interaction assist level: Interacts appropriately less than 25% of the time. May be withdrawn or combative.  Problem Solving Problem solving assist level: Solves basic less than 25% of the time - needs direction nearly all the time or does not effectively solve problems and may need a restraint for safety  Memory Memory assist level: Recognizes or recalls less than 25% of the time/requires cueing greater than 75% of the time   Medical Problem List and Plan: 1.  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   -Beginning therapies today.   -Question ability to participate in the intensity we require on rehab.  May need to start out at 15 hours over 7-day intensity.   -Need to discuss with husband regarding cognitive status and functionality prior to this admission. 2.  DVT Prophylaxis/Anticoagulation: Pharmaceutical: Lovenox   -check dopplers 3. Severe low back pain/Pain Management: tylenol prn 4. Mood: Team to provide ego support. LCSW to follow with patient and husband for support.  5. Neuropsych: This patient is not capable of making decisions on her own behalf. 6. Skin/Wound Care: routine pressure relief measures.  7. Fluids/Electrolytes/Nutrition: Monitor I/O. Offer supplements. Assist with meals due to visual deficits.   -Encourage intake and track ins and outs closely.   -Replace potassium as needed.  Today's labs are pending 8. HTN: Monitor BP bid for trends--may need agent for better control.   9. Fever: Tmax 101.2 overnight   -.  BC X 2 pending. 2/13 UCS with multispecies, chest x-ray reviewed and is negative   -check dopplers   -Continue to observe  LOS (Days) 1 A FACE TO FACE EVALUATION WAS PERFORMED  Meredith Staggers, MD 12/08/2017 8:02 AM

## 2017-12-08 NOTE — Evaluation (Signed)
Occupational Therapy Assessment and Plan  Patient Details  Name: Tammy Arnold MRN: [id number] Date of Birth: 10-19-1941  OT Diagnosis: abnormal posture, acute pain, apraxia, cognitive deficits, disturbance of vision, muscle weakness (generalized) and pain in lower back, coordination disorder Rehab Potential: Rehab Potential (ACUTE ONLY): Good ELOS: 21-24 days   Today's Date: 12/08/2017 OT Individual Time: 4709-6283 OT Individual Time Calculation (min): 75 min     Problem List:  Patient Active Problem List   Diagnosis Date Noted  . Acute brain syndrome 12/07/2017  . FUO (fever of unknown origin)   . Ataxia 12/05/2017  . Cognitive impairment   . Balint's syndrome   . Benign essential HTN   . Abnormal MRI   . Blurry vision   . Hypokalemia   . Acute blood loss anemia   . TIA (transient ischemic attack) 12/04/2017  . Essential hypertension 12/04/2017  . HLD (hyperlipidemia) 12/04/2017    Past Medical History:  Past Medical History:  Diagnosis Date  . Arthritis   . High cholesterol   . Hypertension    Past Surgical History:  Past Surgical History:  Procedure Laterality Date  . CATARACT EXTRACTION  11/23/2017  . REPLACEMENT TOTAL KNEE      Assessment & Plan Clinical Impression: Patient is a 77 y.o. year old female with history of HTN, progressive cognitive decline over 2-3 years,  blurry vision -- recent cataract surgery left eye.History taken from chart review. She was admitted on 2/10/19withfall and difficulty talking. CThead was suggestive of cerebellar abnormality. CTAhead/neck showed atherosclerotic changes with distal small vessel disease and remote left occipital lobe infarct --no perfusion deficits. MRI brain reviewed, showing generalized atrophy. Per report,generalized volume loss with ex vacuo dilation of ventricles and sequelae of chronic ischemic microangiopathy.  She continued to have issues with confusion, agitation and visual disturbance.  Dr. Erlinda Hong felt  that symptoms consistent with balint syndrome as patient with anomia, impaired cognition, optic ataxia, oculomotor apraxia and simultanagnosia. EEG done revealing moderate generalized slowing question due to dementia, toxic/metabolic encephalopathy or infectious etiology.  Patient transferred to CIR on 12/07/2017 .    Patient currently requires total with basic self-care skills secondary to muscle weakness, decreased cardiorespiratoy endurance, motor apraxia, decreased coordination and decreased motor planning, decreased visual acuity, decreased attention to left and decreased attention to right, decreased initiation, decreased attention, decreased awareness, decreased problem solving, decreased safety awareness and delayed processing and decreased sitting balance, decreased standing balance, decreased postural control and decreased balance strategies.  Prior to hospitalization, patient could complete ADLs with independent .  Patient will benefit from skilled intervention to decrease level of assist with basic self-care skills prior to discharge home with care partner.  Anticipate patient will require minimal physical assistance and follow up outpatient.  OT - End of Session Activity Tolerance: Decreased this session Endurance Deficit: Yes Endurance Deficit Description: 2/2 fatigue OT Assessment Rehab Potential (ACUTE ONLY): Good OT Barriers to Discharge: Other (comments) OT Barriers to Discharge Comments: none known at this time OT Patient demonstrates impairments in the following area(s): Balance;Behavior;Vision;Endurance;Motor;Pain;Safety;Perception OT Basic ADL's Functional Problem(s): Grooming;Bathing;Dressing;Toileting OT Transfers Functional Problem(s): Toilet;Tub/Shower OT Additional Impairment(s): None OT Plan OT Intensity: Minimum of 1-2 x/day, 45 to 90 minutes OT Frequency: 5 out of 7 days OT Duration/Estimated Length of Stay: 21-24 days OT Treatment/Interventions: Balance/vestibular  training;Neuromuscular re-education;Self Care/advanced ADL retraining;Therapeutic Exercise;Cognitive remediation/compensation;DME/adaptive equipment instruction;Pain management;Skin care/wound managment;UE/LE Strength taining/ROM;Community reintegration;Patient/family education;UE/LE Coordination activities;Discharge planning;Functional mobility training;Psychosocial support;Therapeutic Activities;Visual/perceptual remediation/compensation OT Self Feeding Anticipated Outcome(s): n/a OT Basic  Self-Care Anticipated Outcome(s): min A OT Toileting Anticipated Outcome(s): min A OT Bathroom Transfers Anticipated Outcome(s): min A OT Recommendation Recommendations for Other Services: Other (comment)(none at this time) Patient destination: Home Follow Up Recommendations: Home health OT Equipment Recommended: To be determined   Skilled Therapeutic Intervention OT educated pt and caregiver, daughter, on OT purpose, POC, and goals with caregiver verbalizing understanding and agreement. Pt required total A for self care and max - total multimodal cues for sequencing tasks. Pt very apraxic throughout session. Pt ambulating 10' without use of AD and mod A. Stand pivot transfer with mod - max A secondary to sequencing and initiation. Please see written eval for details.   OT Evaluation Precautions/Restrictions  Precautions Precautions: Fall Precaution Comments: impaired vision Restrictions Weight Bearing Restrictions: No Vital Signs Therapy Vitals Temp: 98.2 F (36.8 C) Temp Source: Oral Pulse Rate: 84 Resp: 17 BP: (!) 142/63 Patient Position (if appropriate): Sitting Oxygen Therapy SpO2: 97 % O2 Device: Not Delivered Pain Pain Assessment Pain Assessment: Faces Faces Pain Scale: Hurts whole lot Pain Type: Acute pain Pain Location: Back Pain Orientation: Posterior Pain Descriptors / Indicators: Aching Pain Onset: On-going Pain Intervention(s): RN made aware Home Living/Prior  Functioning Home Living Family/patient expects to be discharged to:: Unsure Living Arrangements: Spouse/significant other Available Help at Discharge: Family, Available PRN/intermittently Type of Home: House Home Access: Stairs to enter Technical brewer of Steps: 2 Entrance Stairs-Rails: None Home Layout: Two level, Able to live on main level with bedroom/bathroom Alternate Level Stairs-Number of Steps: flight Bathroom Shower/Tub: Tub/shower unit, Multimedia programmer: Programmer, systems: Yes  Lives With: Spouse IADL History Education: college - [employer] Prior Function Level of Independence: (independent with transfers, gait, and stairs until Christmas when pt was unable to negotiate stairs with rail and ~2 weeks ago pt with limited ability to ambulate throughout house but continued to do so without AD) Vocation: Retired Leisure: Hobbies-yes (Comment) Comments: pt enjoys playing bridge but having difficulty in recent past 2/2 medical issues Vision Baseline Vision/History: Wears glasses Wears Glasses: Reading only Patient Visual Report: Other (comment)(significant visual deficits) Vision Assessment?: Vision impaired- to be further tested in functional context Cognition Overall Cognitive Status: Impaired/Different from baseline Arousal/Alertness: Awake/alert Orientation Level: Person;Place Year: 2019 Month: September Day of Week: Incorrect Memory: Impaired Memory Impairment: Decreased short term memory;Decreased recall of new information;Decreased long term memory;Retrieval deficit Decreased Long Term Memory: Verbal basic;Functional basic Decreased Short Term Memory: Verbal basic;Functional basic Immediate Memory Recall: (0/3) Memory Recall: (0/3) Attention: Selective Sustained Attention: Appears intact Sustained Attention Impairment: Verbal basic;Functional basic Selective Attention: Impaired Selective Attention Impairment: Verbal  basic;Functional basic Awareness: Impaired Awareness Impairment: Emergent impairment Problem Solving: Impaired Problem Solving Impairment: Functional basic Safety/Judgment: Impaired Sensation Coordination Gross Motor Movements are Fluid and Coordinated: No Fine Motor Movements are Fluid and Coordinated: No Motor  Motor Motor - Skilled Clinical Observations: generalized weakness, apraxia Mobility  Transfers Sit to Stand: 3: Mod assist;With armrests Sit to Stand Details: Tactile cues for initiation;Tactile cues for weight shifting;Tactile cues for sequencing;Tactile cues for posture;Verbal cues for sequencing;Verbal cues for technique;Manual facilitation for weight shifting Stand to Sit: 2: Max assist;With armrests Stand to Sit Details (indicate cue type and reason): (max multimodal cuing for technique and to use armrests, pt with impaired motor planning and difficulty turning to sit in seat)  Trunk/Postural Assessment     Balance Balance Balance Assessed: Yes Dynamic Standing Balance Dynamic Standing - Balance Support: Right upper extremity supported Dynamic Standing - Level of  Assistance: 3: Mod assist Dynamic Standing - Balance Activities: (during gait) Extremity/Trunk Assessment RUE Assessment RUE Assessment: Within Functional Limits LUE Assessment LUE Assessment: Within Functional Limits   See Function Navigator for Current Functional Status.   Refer to Care Plan for Long Term Goals  Recommendations for other services: None    Discharge Criteria: Patient will be discharged from OT if patient refuses treatment 3 consecutive times without medical reason, if treatment goals not met, if there is a change in medical status, if patient makes no progress towards goals or if patient is discharged from hospital.  The above assessment, treatment plan, treatment alternatives and goals were discussed and mutually agreed upon: by patient and by family  Gypsy Decant 12/08/2017, 5:57 PM

## 2017-12-08 NOTE — Evaluation (Signed)
Speech Language Pathology Assessment and Plan  Patient Details  Name: Tammy Arnold MRN: [id number] Date of Birth: 1940/11/28  SLP Diagnosis: Cognitive Impairments  Rehab Potential: Fair ELOS: 3-4 weeks     Today's Date: 12/08/2017 SLP Individual Time: 1030-1130 SLP Individual Time Calculation (min): 60 min   Problem List:  Patient Active Problem List   Diagnosis Date Noted  . Acute brain syndrome 12/07/2017  . FUO (fever of unknown origin)   . Ataxia 12/05/2017  . Cognitive impairment   . Balint's syndrome   . Benign essential HTN   . Abnormal MRI   . Blurry vision   . Hypokalemia   . Acute blood loss anemia   . TIA (transient ischemic attack) 12/04/2017  . Essential hypertension 12/04/2017  . HLD (hyperlipidemia) 12/04/2017   Past Medical History:  Past Medical History:  Diagnosis Date  . Arthritis   . High cholesterol   . Hypertension    Past Surgical History:  Past Surgical History:  Procedure Laterality Date  . CATARACT EXTRACTION  11/23/2017  . REPLACEMENT TOTAL KNEE      Assessment / Plan / Recommendation Clinical Impression Patient is a 77 y.o. female with history of HTN, progressive cognitive decline over 2-3 years,  blurry vision -- recent cataract surgery left eye.  She was admitted on 12/04/17 with fall and difficulty talking. CT head was suggestive of cerebellar abnormality. CTA head/neck showed atherosclerotic changes with distal small vessel disease and remote left occipital lobe infarct --no perfusion deficits. MRI brain showed generalized volume loss with ex vacuo dilation of ventricles and sequelae of chronic ischemic microangiopathy.  She continued to have issues with confusion, agitation and visual disturbance.  Dr. Erlinda Hong felt that symptoms consistent with Balint syndrome as patient with anomia, impaired cognition, optic ataxia, oculomotor apraxia and simultanagnosia. EEG done revealing moderate generalized slowing question due to dementia,  toxic/metabolic encephalopathy or infectious etiology. Patient admitted to [facility] 12/07/17.  Patient demonstrates severe cognitive impairments impacting attention, problem solving, recall, visual scanning and emergent awareness. Suspect function is also impacted by baseline cognitive impairments and apraxia. Patient would benefit from skilled SLP intervention to maximize her cognitive function and overall functional independence prior to discharge.    Skilled Therapeutic Interventions          Administered a cognitive-linguistic evaluation. Please see above for details. SLP facilitated session by providing Max A verbal and tactile cues for tracking/visual scanning during functional tasks. Educated patient and her daughter in regards to goals of skilled SLP intervention, both verbalized understanding and agreement.   SLP Assessment  Patient will need skilled [facility] during [facility] admission    Recommendations  Oral Care Recommendations: Oral care BID Recommendations for Other Services: Neuropsych consult Patient destination: Home Follow up Recommendations: Home Health SLP;24 hour supervision/assistance;Outpatient SLP Equipment Recommended: None recommended by SLP    SLP Frequency 3 to 5 out of 7 days   SLP Duration  SLP Intensity  SLP Treatment/Interventions 3-4 weeks   Minumum of 1-2 x/day, 30 to 90 minutes  Cognitive remediation/compensation;Environmental controls;Internal/external aids;Therapeutic Activities;Patient/family education;Functional tasks;Cueing hierarchy    Pain No reports of pain    Function:   Cognition Comprehension Comprehension assist level: Understands basic less than 25% of the time/ requires cueing >75% of the time  Expression   Expression assist level: Expresses basis less than 25% of the time/requires cueing >75% of the time.  Social Interaction Social Interaction assist level: Interacts appropriately less than 25% of the time. May  be  withdrawn or combative.  Problem Solving Problem solving assist level: Solves basic less than 25% of the time - needs direction nearly all the time or does not effectively solve problems and may need a restraint for safety  Memory Memory assist level: Recognizes or recalls less than 25% of the time/requires cueing greater than 75% of the time   Short Term Goals: Week 1: SLP Short Term Goal 1 (Week 1): Patient will self-monitor and correct errors during functional tasks with Max A multimodal cues.  SLP Short Term Goal 2 (Week 1): Patient will visually scan to locate specific items during functional tasks with Max A multimodal cues.  SLP Short Term Goal 4 (Week 1): Patient will demonstrate functional problem solving with functional and familair tasks with Max A multimodal cues.  SLP Short Term Goal 5 (Week 1): Patient will demonstrate selective attention to functional tasks for ~30 minutes with Min A verbal cues for redirection.   Refer to Care Plan for Long Term Goals  Recommendations for other services: Neuropsych  Discharge Criteria: Patient will be discharged from SLP if patient refuses treatment 3 consecutive times without medical reason, if treatment goals not met, if there is a change in medical status, if patient makes no progress towards goals or if patient is discharged from hospital.  The above assessment, treatment plan, treatment alternatives and goals were discussed and mutually agreed upon: by patient and by family  [name] 12/08/2017, 3:06 PM

## 2017-12-08 NOTE — Evaluation (Signed)
Physical Therapy Assessment and Plan  Patient Details  Name: JANESA DOCKERY MRN: [id number] Date of Birth: December 06, 1940  PT Diagnosis: Abnormality of gait, Coordination disorder, Difficulty walking, Impaired cognition, Muscle weakness and Pain in back Rehab Potential: Fair ELOS: 2-3 weeks   Today's Date: 12/08/2017 PT Individual Time: 1305-1405 PT Individual Time Calculation (min): 60 min    Problem List:  Patient Active Problem List   Diagnosis Date Noted  . Acute brain syndrome 12/07/2017  . FUO (fever of unknown origin)   . Ataxia 12/05/2017  . Cognitive impairment   . Balint's syndrome   . Benign essential HTN   . Abnormal MRI   . Blurry vision   . Hypokalemia   . Acute blood loss anemia   . TIA (transient ischemic attack) 12/04/2017  . Essential hypertension 12/04/2017  . HLD (hyperlipidemia) 12/04/2017    Past Medical History:  Past Medical History:  Diagnosis Date  . Arthritis   . High cholesterol   . Hypertension    Past Surgical History:  Past Surgical History:  Procedure Laterality Date  . CATARACT EXTRACTION  11/23/2017  . REPLACEMENT TOTAL KNEE      Assessment & Plan Clinical Impression: Patient is a 77 y.o. year old female with history of HTN, progressive cognitive decline over 2-3 years,  blurry vision -- recent cataract surgery left eye.History taken from chart review. She was admitted on 2/10/19withfall and difficulty talking. CThead was suggestive of cerebellar abnormality. CTAhead/neck showed atherosclerotic changes with distal small vessel disease and remote left occipital lobe infarct --no perfusion deficits. MRI brain reviewed, showing generalized atrophy. Per report,generalized volume loss with ex vacuo dilation of ventricles and sequelae of chronic ischemic microangiopathy.  She continued to have issues with confusion, agitation and visual disturbance.  Dr. Erlinda Hong felt that symptoms consistent with balint syndrome as patient with anomia, impaired  cognition, optic ataxia, oculomotor apraxia and simultanagnosia. EEG done revealing moderate generalized slowing question due to dementia, toxic/metabolic encephalopathy or infectious etiology.  Patient transferred to CIR on 12/07/2017 .   Patient currently requires mod/max assist with mobility secondary to muscle weakness, decreased cardiorespiratoy endurance, motor apraxia, decreased coordination and decreased motor planning, decreased visual acuity and decreased visual perceptual skills, decreased attention, decreased awareness, decreased problem solving, decreased safety awareness, decreased memory and delayed processing, and decreased standing balance, decreased postural control and decreased balance strategies.  Prior to hospitalization, patient was independent  with mobility and lived with Spouse in a House home.  Home access is 2Stairs to enter.  Patient will benefit from skilled PT intervention to maximize safe functional mobility, minimize fall risk and decrease caregiver burden for planned discharge home with 24 hour assist.  Anticipate patient will benefit from follow up [facility] at discharge.  PT - End of Session Activity Tolerance: Tolerates 30+ min activity with multiple rests Endurance Deficit: Yes Endurance Deficit Description: 2/2 fatigue PT Assessment Rehab Potential (ACUTE/IP ONLY): Fair PT Barriers to Discharge: Lack of/limited family support;Other (comments) PT Barriers to Discharge Comments: unsure if pt's family can provide 24 hr assistance; pt significantly limited by visual impairments PT Patient demonstrates impairments in the following area(s): Balance;Safety;Behavior;Endurance;Motor;Perception;Pain PT Transfers Functional Problem(s): Bed Mobility;Bed to Chair;Car;Furniture PT Locomotion Functional Problem(s): Ambulation;Stairs;Wheelchair Mobility PT Plan PT Intensity: Minimum of 1-2 x/day ,45 to 90 minutes PT Frequency: 5 out of 7 days PT Duration Estimated Length of Stay:  2-3 weeks PT Treatment/Interventions: Ambulation/gait training;Community reintegration;DME/adaptive equipment instruction;Neuromuscular re-education;Psychosocial support;Stair training;UE/LE Strength taining/ROM;Wheelchair propulsion/positioning;UE/LE Coordination activities;Therapeutic Activities;Balance/vestibular training;Discharge planning;Pain management;Skin  care/wound management;Cognitive remediation/compensation;Disease management/prevention;Functional mobility training;Patient/family education;Therapeutic Exercise;Visual/perceptual remediation/compensation PT Transfers Anticipated Outcome(s): min assist with LRAD PT Locomotion Anticipated Outcome(s): min assist with LRAD PT Recommendation Recommendations for Other Services: Speech consult;Therapeutic Recreation consult Therapeutic Recreation Interventions: Pet therapy;Stress management Follow Up Recommendations: Home health PT;24 hour supervision/assistance Patient destination: Home Equipment Recommended: To be determined  Skilled Therapeutic Intervention Pt received in w/c with daughter Cyril Mourning present for initial portion of session. Pt unable to recall CIR information and therapist re-educated pt on daily therapy schedule, weekly interdisciplinary team meeting, and other various CIR information. Pt and daughter able to provide PLOF and home set up information then pt's daughter exited session. Pt with difficulty word-finding throughout session and unable to recall current month and year from choice of two. Pt completes sit>stand transfers, ambulation with HHA, and stair negotiation with B rails with mod assist overall. Pt requires max assist for car transfer at [place] simulated height 2/2 significantly impaired motor planning and inability to follow commands to transfer LE in/out of car. Pt also reports she feels as though she's falling when therapist assists her LE into car. Pt is significantly limited by visual and cognitive deficits that may  limit progress but she is very pleasant throughout session and willing to participate. At end of session pt left sitting in w/c with QRB & chair alarm donned & at nurses station.   PT Evaluation Precautions/Restrictions Precautions Precautions: Fall Precaution Comments: impaired vision Restrictions Weight Bearing Restrictions: No  General Chart Reviewed: Yes Additional Pertinent History: HTN, arthritis, high cholesterol Response to Previous Treatment: Patient with no complaints from previous session. Family/Caregiver Present: Yes(daugther, Cyril Mourning, present at beginning of session only)  Pain Reported neck & back pain at beginning of session but denied pain during middle of session.  Home Living/Prior Functioning Home Living Available Help at Discharge: Family;Available PRN/intermittently(pt's husband Gershon Mussel still works part time as Materials engineer) Type of Home: [employer] Access: Stairs to enter [employer] of Steps: 2 Entrance Stairs-Rails: None Home Layout: Two level;Able to live on main level with bedroom/bathroom  Lives With: Spouse Prior Function Level of Independence: (independent with transfers, gait, and stairs until Christmas when pt was unable to negotiate stairs with rail and ~2 weeks ago pt with limited ability to ambulate throughout house but continued to do so without AD) Vocation: Retired Leisure: Hobbies-yes (Comment) Comments: pt enjoys playing bridge but having difficulty in recent past 2/2 medical issues, pt has 2 cats at home  Vision/Perception  Pt wears glasses for reading only at baseline. Pt with cataract removed 1-2 weeks ago. Pt with significant visual impairments but unable to get clear picture of current visual deficits. Please see chart. Pt overshoots/undershoots objects & appears to have impaired depth perception.   Cognition Overall Cognitive Status: Impaired/Different from baseline Arousal/Alertness: Awake/alert Orientation Level: Oriented to  person Memory: Impaired Memory Impairment: Decreased short term memory;Decreased recall of new information;Decreased long term memory;Retrieval deficit Awareness: Impaired Problem Solving: Impaired Problem Solving Impairment: Functional basic Safety/Judgment: Impaired  Sensation Coordination Gross Motor Movements are Fluid and Coordinated: No  Motor  Motor Motor - Skilled Clinical Observations: generalized weakness   Mobility Transfers Transfers: Yes Sit to Stand: 3: Mod assist;With armrests Sit to Stand Details: Tactile cues for initiation;Tactile cues for weight shifting;Tactile cues for sequencing;Tactile cues for posture;Verbal cues for sequencing;Verbal cues for technique;Manual facilitation for weight shifting Stand to Sit: 2: Max assist;With armrests Stand to Sit Details (indicate cue type and reason): (max multimodal cuing for technique and to use  armrests, pt with impaired motor planning and difficulty turning to sit in seat)  Locomotion  Ambulation Ambulation: Yes Ambulation/Gait Assistance: 3: Mod assist Ambulation Distance (Feet): 65 Feet Assistive device: 1 person hand held assist(RUE HHA) Gait Gait: Yes Gait Pattern: Decreased step length - right;Decreased step length - left;Decreased stride length(intermittent shuffled gait, intermittent impaired weight shifting L<>R) Stairs / Additional Locomotion Stairs: Yes Stairs Assistance: 3: Mod assist(pt with difficulty seeing steps and with impaired foot placement completely & safely on steps) Stair Management Technique: Two rails Number of Stairs: 12 Height of Stairs: (6" + 3") Wheelchair Mobility Wheelchair Mobility: No   Balance Balance Balance Assessed: Yes Dynamic Standing Balance Dynamic Standing - Balance Support: Right upper extremity supported Dynamic Standing - Level of Assistance: 3: Mod assist Dynamic Standing - Balance Activities: (during gait)  Extremity Assessment  RUE Assessment RUE  Assessment: Within Functional Limits LUE Assessment LUE Assessment: Within Functional Limits RLE Assessment RLE Assessment: Within Functional Limits LLE Assessment LLE Assessment: Within Functional Limits   See Function Navigator for Current Functional Status.   Refer to Care Plan for Long Term Goals  Recommendations for other services: Therapeutic Recreation  Pet therapy and Stress management  Discharge Criteria: Patient will be discharged from PT if patient refuses treatment 3 consecutive times without medical reason, if treatment goals not met, if there is a change in medical status, if patient makes no progress towards goals or if patient is discharged from hospital.  The above assessment, treatment plan, treatment alternatives and goals were discussed and mutually agreed upon: by patient  Waunita Schooner 12/08/2017, 4:43 PM

## 2017-12-09 ENCOUNTER — Inpatient Hospital Stay (HOSPITAL_COMMUNITY): Payer: Medicare Other | Admitting: Physical Therapy

## 2017-12-09 ENCOUNTER — Inpatient Hospital Stay (HOSPITAL_COMMUNITY): Payer: Medicare Other

## 2017-12-09 ENCOUNTER — Inpatient Hospital Stay (HOSPITAL_COMMUNITY): Payer: Medicare Other | Admitting: Occupational Therapy

## 2017-12-09 ENCOUNTER — Inpatient Hospital Stay (HOSPITAL_COMMUNITY): Payer: Medicare Other | Admitting: Speech Pathology

## 2017-12-09 DIAGNOSIS — M7989 Other specified soft tissue disorders: Secondary | ICD-10-CM

## 2017-12-09 DIAGNOSIS — H538 Other visual disturbances: Secondary | ICD-10-CM

## 2017-12-09 DIAGNOSIS — R270 Ataxia, unspecified: Secondary | ICD-10-CM

## 2017-12-09 DIAGNOSIS — I10 Essential (primary) hypertension: Secondary | ICD-10-CM

## 2017-12-09 MED ORDER — METHOCARBAMOL 500 MG PO TABS
500.0000 mg | ORAL_TABLET | Freq: Four times a day (QID) | ORAL | Status: DC | PRN
  Administered 2017-12-09 – 2017-12-11 (×3): 500 mg via ORAL
  Filled 2017-12-09 (×5): qty 1

## 2017-12-09 NOTE — Care Management Note (Signed)
[facility] Individual Statement of Services  Patient Name:  Tammy Arnold  Date:  12/09/2017  Welcome to the [facility].  Our goal is to provide you with an individualized program based on your diagnosis and situation, designed to meet your specific needs.  With this comprehensive rehabilitation program, you will be expected to participate in at least 3 hours of rehabilitation therapies Monday-Friday, with modified therapy programming on the weekends.  Your rehabilitation program will include the following services:  Physical Therapy (PT), Occupational Therapy (OT), Speech Therapy (ST), 24 hour per day rehabilitation nursing, Therapeutic Recreaction (TR), Neuropsychology, Case Management (Social Worker), Rehabilitation Medicine, Nutrition Services and Pharmacy Services  Weekly team conferences will be held on Tuesdays to discuss your progress.  Your Social Worker will talk with you frequently to get your input and to update you on team discussions.  Team conferences with you and your family in attendance may also be held.  Expected length of stay: 14-17 days    Overall anticipated outcome: minimal assistance  Depending on your progress and recovery, your program may change. Your Social Worker will coordinate services and will keep you informed of any changes. Your Social Worker's name and contact numbers are listed  below.  The following services may also be recommended but are not provided by the [facility] will be made to provide these services after discharge if needed.  Arrangements include referral to agencies that provide these services.  Your insurance has been verified to be:  Medicare and [employer] Your primary doctor is:  Kelton Pillar  Pertinent information will be shared with your doctor and  your insurance company.  Social Worker:  [place], [place] or ([phone]   Information discussed with and copy given to patient by: Lennart Pall, 12/09/2017, 2:28 PM

## 2017-12-09 NOTE — Progress Notes (Signed)
Social Work  Social Work Assessment and Plan  Patient Details  Name: Tammy Arnold MRN: [id number] Date of Birth: 1940-12-16  Today's Date: 12/09/2017  Problem List:  Patient Active Problem List   Diagnosis Date Noted  . 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 FUO (fever of unknown origin)   . Ataxia 12/05/2017  . Cognitive impairment   . Abnormal MRI   . Blurry vision   . Hypokalemia   . Acute blood loss anemia   . TIA (transient ischemic attack) 12/04/2017  . Essential hypertension 12/04/2017  . HLD (hyperlipidemia) 12/04/2017   Past Medical History:  Past Medical History:  Diagnosis Date  . Arthritis   . High cholesterol   . Hypertension    Past Surgical History:  Past Surgical History:  Procedure Laterality Date  . CATARACT EXTRACTION  11/23/2017  . REPLACEMENT TOTAL KNEE     Social History:  reports that  has never smoked. she has never used smokeless tobacco. She reports that she does not drink alcohol. Her drug history is not on file.  Family / Support Systems Marital Status: Married How Long?: 50+ yrs Patient Roles: Spouse, Parent Spouse/Significant Other: Margurite Duffy @ (H) [phone] or (C) [phone] Children: Pt and spouse have 3 adult children living in [place], [place] and [place], [place].  Daughter, Cyril Mourning [employer] [employer] [facility] [employer]) @ (C) [phone]. Anticipated Caregiver: spouse and hired caregivers as needed Ability/Limitations of Caregiver: spouse works as GYN MD on MOn, Wed and Friday (1/2 day on Friday) Caregiver Availability: 24/7 Family Dynamics: [name] spouse reports that their adult children are supportive, however, they will not be able to provide in-home support at discharge.  Spouse at bedside and encourage patient and states he will be primary support at home.  Social History Preferred language: English Religion: None Cultural Background: NA Read: Yes Write: Yes Legal Hisotry/Current Legal Issues: None Guardian/Conservator: None -Per MD, patient is not  capable of making decisions on her own behalf-defer to spouse.   Abuse/Neglect Abuse/Neglect Assessment Can Be Completed: Unable to assess, patient is non-responsive or altered mental status  Emotional Status Pt's affect, behavior adn adjustment status: Patient sitting up in wheelchair but does not engage as I speak with her spouse.  When asked questions directly, she offers 1-2 word answers which are not always correct and husband has to provide correct information.  Patient is able to state that she is in the hospital, however, cannot give a reason.  Patient does make expressions at times as has been answers questions as if she is in disagreement or is exasperated for some reason.  May benefit from neuropsychology involvement for further evaluation of cognition.  Patient does not appear to be in any emotional distress, however, will monitor throughout CIR stay. Recent Psychosocial Issues: Husband reports that patient has had decline in cognitive function "over the last little bit of time", however, stresses that she has been physically independent until this admission. Pyschiatric History: None Substance Abuse History: None  Patient / Family Perceptions, Expectations & Goals Pt/Family understanding of illness & functional limitations: As noted, patient is unable to state reason for being in the hospital.  Spouse is a physician and is aware of her diagnosis of Balint syndrome.  He does report that he had believed patient must have suffered a stroke but is aware that physicians do not feel this occurred her x-rays. Premorbid pt/family roles/activities: As noted, spouse states cognitive decline was present PTA yet stresses that she was "just shopping at [employer] a few days before  she came into the hospital." Anticipated changes in roles/activities/participation: CIR team has goals for him to minimal assistance overall which will indicate that husband will need to increase support as well as  coverage on a 24/7 basis Pt/family expectations/goals: Per spouse, "I would like to see her make some gains towards her prior physical independence."  [employer]: None Premorbid Home Care/DME Agencies: None Transportation available at discharge: Yes Resource referrals recommended: Neuropsychology  Discharge Planning Living Arrangements: Spouse/significant other Support Systems: Spouse/significant other, Children Type of Residence: Private residence Insurance Resources: [employer], Multimedia programmer (specify)([employer] [place]) Financial Screen Referred: No Living Expenses: Own Money Management: Spouse Does the patient have any problems obtaining your medications?: No Home Management: Patient and spouse shared responsibilities for home management Patient/Family Preliminary Plans: At time of CIR admit, plan was noted to be DC home with spouse and privately hired assistance, however, dependent on progress may need to pursue SNF. Social Work Anticipated Follow Up Needs: HH/OP, SNF Expected length of stay: ELOS 14 to 17 days  Clinical Impression Unfortunate, elderly woman here with diagnosis of Balint Syndrome and with acute onset of physical decline leading to hospitalization.  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  Tx team anticipates pt will require min assist overall and husband is aware.  He will arrange for private duty caregiver or may consider SNF.  Pt with significant cognitive impairment and does require her spouse's assistance to complete assessment and interview.  Would benefit from neuropsychology consult for additional input for team and family.  Social worker to follow for support and discharge planning needs  Zaelyn Barbary 12/09/2017, 2:25 PM

## 2017-12-09 NOTE — Progress Notes (Signed)
Occupational Therapy Session Note  Patient Details  Name: Tammy Arnold MRN: [id number] Date of Birth: 1941-10-05  Today's Date: 12/09/2017 OT Individual Time: 1131-1200 and 9937-1696 OT Individual Time Calculation (min): 29 min and 42 min  Short Term Goals: Week 1:  OT Short Term Goal 1 (Week 1): Pt will scan to items to locate items needed for self care with mod multimodal cues.  OT Short Term Goal 2 (Week 1): Pt will perform toileting with max A to decrease level of assist with self care. OT Short Term Goal 3 (Week 1): Pt will perform toilet transfer with mod A for 3 consecutive sessions.  Skilled Therapeutic Interventions/Progress Updates:    Pt greeted via SLP handoff in room. Agreeable to tx, though reporting considerable amount of pain in back. Already medicated. Had her engage in light grooming tasks w/c level with focus on motor planning, sequencing, visual scanning, coordination, and perceptual skills. Pt requiring max multimodal cues and HOH assist for executing all motor tasks due to pain, visual and cognitive deficits. Pt flinching from pain when reaching slightly forward and when twisting to place paper towels into trash bin. RN made aware. At end of tx pt was escorted to RN station. Safety belt fastened and chair alarm set.    2nd Session 1: 1 tx (42 min) Pt greeted at [employer] station. She was escorted to dayroom to engage in simple task of laundry folding. Pt requiring max multimodal cues and HOH assist for folding wash cloths. Unable to exhibit carryover of education when provided with modeling, repetition, and HOH. Pt requested to have a drink and required similar cuing to set up drink of soda (I.e. Opening straw wrapper, pouring coke into cup, placing lid on cup). When cued to drink, pt removing straw from cup and placing it in her mouth. Spouse present to observe during session. He also provided cuing for her to recall names of children and pet cats. At end of tx pt was left in  dayroom with spouse. Safety belt fastened and chair alarm set.   Therapy Documentation Precautions:  Precautions Precautions: Fall Precaution Comments: impaired vision Restrictions Weight Bearing Restrictions: No Pain Assessment Pain Assessment: 0-10 Pain Score: 7  Pain Type: Chronic pain Pain Location: Back Pain Orientation: Mid;Lower Pain Radiating Towards: bil legs Pain Descriptors / Indicators: Throbbing Pain Frequency: Intermittent Pain Onset: On-going Pain Intervention(s): Medication (See eMAR) ADL:      See Function Navigator for Current Functional Status.  Therapy/Group: Individual Therapy  Caston Coopersmith A Shiron Whetsel 12/09/2017, 12:45 PM

## 2017-12-09 NOTE — Progress Notes (Signed)
Tammy Arnold     PROGRESS NOTE    Subjective/Complaints: Had a reasonable night. In enclosure bed. Back still sore.   ROS: Limited due to cognitive/behavioral    Objective: Vital Signs: Blood pressure 134/86, pulse 91, temperature 99.1 F (37.3 C), temperature source Oral, resp. rate 18, SpO2 98 %. Dg Lumbar Spine 2-3 Views  Result Date: 12/07/2017 CLINICAL DATA:  Low back pain EXAM: LUMBAR SPINE - 2-3 VIEW COMPARISON:  MRI lumbar spine 12/08/2015 FINDINGS: Five non-rib-bearing lumbar vertebra. Partial sacralization of L5 vertebral body. Biconvex thoracolumbar scoliosis. Multilevel disc space narrowing and endplate spur formation. Vertebral body heights maintained without fracture or subluxation. No bone destruction. SI joints preserved. IMPRESSION: Degenerative disc and facet disease changes lumbar spine with associated scoliosis. No acute abnormalities. Electronically Signed   By: Lavonia Dana M.D.   On: 12/07/2017 12:32   Recent Labs    12/07/17 0531 12/08/17 0644  WBC 9.1 10.5  HGB 11.6* 12.1  HCT 35.8* 36.3  PLT 254 269   Recent Labs    12/07/17 0531 12/08/17 0644  NA 141 140  K 3.3* 3.7  CL 106 105  GLUCOSE 103* 116*  BUN 9 11  CREATININE 0.73 0.69  CALCIUM 9.1 8.9   CBG (last 3)  No results for input(s): GLUCAP in the last 72 hours.  Wt Readings from Last 3 Encounters:  12/04/17 56.7 kg (125 lb)  07/18/15 56.7 kg (125 lb)    Physical Exam:  Nursing note and vitals reviewed. Constitutional: She appears well-developed and well-nourished. No distress.  HENT:  Head: Normocephalic and atraumatic.  Eyes: Right eye exhibits no discharge. Left eye exhibits no discharge.    Neck: Normal range of motion. Neck supple.  Cardiovascular: RRR without murmur. No JVD    Respiratory: CTA Bilaterally without wheezes or rales. Normal effort    GI: Soft. Bowel sounds are normal. She exhibits no distension.  Musculoskeletal:  No edema or  tenderness in extremities  Neurological: She is alert.  Eyes open and follows commands. Moves all 4's. Doesn't recall events of yesterday. Oriented x 1, recognizes husband.  Skin: Skin is warm and dry. She is not diaphoretic.  Psychiatric: flat/blunt    Assessment/Plan: 1.  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 Physiatrist is providing close team supervision and 24 hour management of active medical problems listed below. Physiatrist and rehab team continue to assess barriers to discharge/monitor patient progress toward functional and medical goals.  Function:  Bathing Bathing position   Position: Sitting EOB  Bathing parts Body parts bathed by patient: Chest Body parts bathed by helper: Right arm, Left arm, Abdomen, Front perineal area, Buttocks, Right upper leg, Left upper leg, Right lower leg, Left lower leg, Back  Bathing assist Assist Level: (total A)      Upper Body Dressing/Undressing Upper body dressing   What is the patient wearing?: Pull over shirt/dress       Pull over shirt/dress - Perfomed by helper: Thread/unthread right sleeve, Thread/unthread left sleeve, Put head through opening, Pull shirt over trunk        Upper body assist Assist Level: (total A)      Lower Body Dressing/Undressing Lower body dressing   What is the patient wearing?: Pants, Non-skid slipper socks       Pants- Performed by helper: Thread/unthread right pants leg, Thread/unthread left pants leg, Pull pants up/down  Non-skid slipper socks- Performed by helper: Don/doff right sock, Don/doff left sock                  Lower body assist Assist for lower body dressing: (total A)      Toileting Toileting Toileting activity did not occur: No continent bowel/bladder event   Toileting steps completed by helper: Adjust clothing prior to toileting, Performs  perineal hygiene, Adjust clothing after toileting    Toileting assist     Transfers Chair/bed transfer Chair/bed transfer activity did not occur: Safety/medical concerns Chair/bed transfer method: Stand pivot Chair/bed transfer assist level: Maximal assist (Pt 25 - 49%/lift and lower)       Locomotion Ambulation     Max distance: 65 ft Assist level: Moderate assist (Pt 50 - 74%)   Wheelchair Wheelchair activity did not occur: Safety/medical concerns        Cognition Comprehension Comprehension assist level: Understands basic less than 25% of the time/ requires cueing >75% of the time  Expression Expression assist level: Expresses basis less than 25% of the time/requires cueing >75% of the time.  Social Interaction Social Interaction assist level: Interacts appropriately less than 25% of the time. May be withdrawn or combative.  Problem Solving Problem solving assist level: Solves basic less than 25% of the time - needs direction nearly all the time or does not effectively solve problems and may need a restraint for safety  Memory Memory assist level: Recognizes or recalls less than 25% of the time/requires cueing greater than 75% of the time   Medical Problem List and Plan: 1.  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   -Beginning therapies today.   -Had a lengthy discussion with husband today regarding his wife and her diagnosis.  He would also like to speak with neurology again as well given the diagnosis and presentation/radiology reports.  He is aware of the fact that she may have struggles carrying over information from day to day or participating from day-to-day.  I made him aware of the team conference next week which will review her participation and progress.  He understands that she may need SNF to provide for needs on a longer term basis depending on how she progresses here.   -Need to discuss with husband regarding cognitive  status and functionality prior to this admission. 2.  DVT Prophylaxis/Anticoagulation: Pharmaceutical: Lovenox   -dopplers pending today 3. Severe low back pain/Pain Management: tylenol prn   -kpad 4. Mood: Team to provide ego support. LCSW to follow with patient and husband for support.  5. Neuropsych: This patient is not capable of making decisions on her own behalf. 6. Skin/Wound Care: routine pressure relief measures.  7. Fluids/Electrolytes/Nutrition: Monitor I/O. Offer supplements. Assist with meals due to visual deficits.   -Encourage intake and track ins and outs closely.   -Replace potassium as needed.  3.7 8. HTN: Monitor BP bid for trends--may need agent for better control.   9. Fever: generally low grade   - BC X 2 still pending. 2/13 UCS with multispecies, chest x-ray reviewed and is negative   -dopplers pending   -Continue to observe  LOS (Days) 2 A FACE TO FACE EVALUATION WAS PERFORMED  Tammy Staggers, MD 12/09/2017 9:03 AM

## 2017-12-09 NOTE — Progress Notes (Signed)
Speech Language Pathology Daily Session Note  Patient Details  Name: Tammy Arnold MRN: [id number] Date of Birth: [birth year]  Today's Date: 12/09/2017 SLP Individual Time: 1030-1130 SLP Individual Time Calculation (min): 60 min  Short Term Goals: Week 1: SLP Short Term Goal 1 (Week 1): Patient will self-monitor and correct errors during functional tasks with Max A multimodal cues.  SLP Short Term Goal 2 (Week 1): Patient will visually scan to locate specific items during functional tasks with Max A multimodal cues.  SLP Short Term Goal 4 (Week 1): Patient will demonstrate functional problem solving with functional and familair tasks with Max A multimodal cues.  SLP Short Term Goal 5 (Week 1): Patient will demonstrate selective attention to functional tasks for ~30 minutes with Min A verbal cues for redirection.   Skilled Therapeutic Interventions: Skilled treatment session focused on cognitive goals. Upon arrival, patient was awake while supine in enclosure bed. SLP facilitated session by providing total A for patient to sit EOB from supine due to severe back pain. RN aware and administered medications. SLP also facilitated session by providing extra time and Min A verbal cues for visual scanning during a basic decoding task at the word, phrase and sentence level. Patient also matched the word, phrase, and sentence to a picture from a field of 6 with 50% accuracy. Patient required Max-Total A multimodal cues along with extra time to complete functional and familiar tasks in regards to problem solving and attention and to self-monitor and correct errors. Suspect function impacted by severe apraxia. Patient handed off to PT at end of session. Continue with current plan of care.       Function:   Cognition Comprehension Comprehension assist level: Understands basic less than 25% of the time/ requires cueing >75% of the time  Expression   Expression assist level: Expresses basis less than 25% of  the time/requires cueing >75% of the time.  Social Interaction Social Interaction assist level: Interacts appropriately less than 25% of the time. May be withdrawn or combative.  Problem Solving Problem solving assist level: Solves basic less than 25% of the time - needs direction nearly all the time or does not effectively solve problems and may need a restraint for safety  Memory Memory assist level: Recognizes or recalls less than 25% of the time/requires cueing greater than 75% of the time    Pain Pain Assessment Pain Assessment: 0-10 Pain Score: 7  Pain Type: Chronic pain Pain Location: Back Pain Orientation: Mid;Lower Pain Radiating Towards: bil legs Pain Descriptors / Indicators: Throbbing Pain Frequency: Intermittent Pain Onset: On-going Pain Intervention(s): Medication (See eMAR)  Therapy/Group: Individual Therapy  Kynadie Yaun 12/09/2017, 12:07 PM

## 2017-12-09 NOTE — Progress Notes (Signed)
Physical Therapy Session Note  Patient Details  Name: Tammy Arnold MRN: [id number] Date of Birth: [birth year]  Today's Date: 12/09/2017 PT Individual Time: 1313-1402 PT Individual Time Calculation (min): 49 min   Short Term Goals: Week 1:  PT Short Term Goal 1 (Week 1): Pt will complete furniture transfer from couch with mod assist. PT Short Term Goal 2 (Week 1): Pt will ambulate 125 ft with mod assist with LRAD. PT Short Term Goal 3 (Week 1): Pt will negotiate 4 steps with 1 rail with mod assist.  Skilled Therapeutic Interventions/Progress Updates:  Pt received in w/c with husband present for session. During session pt with behaviors demonstrating pain in R rib area, back & BLE -- RN reports pt is premedicated prior to session & is awaiting delivery of k-pad. Session focused heavily on family education with therapist discussing pt's CLOF, anticipated PT goals, need for 24 hr assistance upon d/c, and cognitive, visual, and physical limitations. Pt's husband receptive of all information and asking appropriate questions. Pt completes sit>stand transfers with mod assist 2/2 back pain. Pt ambulates 180 ft + 300 ft with R HHA; pt with kyphotic posture with difficulty extending back and demonstrates decreased step length BLE with intermittent shuffled gait. Pt with improved ability to transfer to sitting in w/c when given one step commands as she only requires mod assist on this date. During ambulation, pt at times looking to the side even though ambulating in a linear path and when asked why she stated she was looking at a man even though no one was there. Therapist provided example and continued to educate pt's husband on pt's current status. At end of session pt's husband exited and pt left sitting in w/c with QRB & chair alarm donned & at nurses station.   Therapy Documentation Precautions:  Precautions Precautions: Fall Precaution Comments: impaired vision Restrictions Weight Bearing  Restrictions: No   See Function Navigator for Current Functional Status.   Therapy/Group: Individual Therapy  Waunita Schooner 12/09/2017, 4:19 PM

## 2017-12-10 ENCOUNTER — Inpatient Hospital Stay (HOSPITAL_COMMUNITY): Payer: Medicare Other | Admitting: Occupational Therapy

## 2017-12-10 ENCOUNTER — Inpatient Hospital Stay (HOSPITAL_COMMUNITY): Payer: Medicare Other

## 2017-12-10 ENCOUNTER — Inpatient Hospital Stay (HOSPITAL_COMMUNITY): Payer: Medicare Other | Admitting: Physical Therapy

## 2017-12-10 NOTE — Progress Notes (Signed)
Occupational Therapy Session Note  Patient Details  Name: DEMICA ZOOK MRN: [id number] Date of Birth: 1941-06-25  Today's Date: 12/10/2017 OT Individual Time: 1102-1117 and 3567-0141 OT Individual Time Calculation (min): 74 min and 32 min  Short Term Goals: Week 1:  OT Short Term Goal 1 (Week 1): Pt will scan to items to locate items needed for self care with mod multimodal cues.  OT Short Term Goal 2 (Week 1): Pt will perform toileting with max A to decrease level of assist with self care. OT Short Term Goal 3 (Week 1): Pt will perform toilet transfer with mod A for 3 consecutive sessions.  Skilled Therapeutic Interventions/Progress Updates:    Pt greeted sliding out of w/c, pulling at hospital gown, grimacing, and smelling of soiled brief. With calming cues, pts agitation visibly eased, however pt still flinching when touched due to c/o pain. Pt not due for pain medication. Stand pivot<drop arm BSC completed with Mod A, increased time for motor planning, and tactile cues for technique/hand placement. Pt bathing while seated on BSC. Initially amenable in shower, assisting with washing upper legs and chest. Tolerating warm water. Pt then with increased agitation, and required 10 minutes to initiate transfer out of shower. Once hospital gown was donned, pt completed stand pivot<bed with Mod A, holding onto therapist's ponytail mid-transfer. She released grip when sitting with calming cues. Pain/feelings of anxiety increased when returned to supine, and +2 assist required for repositioning and donning clean brief.  Pt left in secured enclosure bed with all needs.   RN made aware of pts pain.   2nd Session 1:1 tx (32) Pt greeted supine in bed. 2 helpers for supine<sit due to cognition/pain.  Once EOB, tried to have pt participate in UB/LB dressing. Pt internally distracted and unable to follow 1 step instructions with extra time in order to assist with dressing herself. RN present to assist with  elevating pants over hips while pt stood with OT and Mod A. Pt then returned to supine. Sons arrived and educated them on pts current functional level and OT barriers. One son assisted OT with transferring pt back to supine. Pt safely secured in enclosure bed at end of tx. Family at bedside.    Therapy Documentation Precautions:  Precautions Precautions: Fall Precaution Comments: impaired vision Restrictions Weight Bearing Restrictions: No ADL:  :    See Function Navigator for Current Functional Status.   Therapy/Group: Individual Therapy  Stana Bayon A Brennyn Ortlieb 12/10/2017, 5:30 PM

## 2017-12-10 NOTE — Progress Notes (Signed)
Physical Therapy Session Note  Patient Details  Name: [name] MRN: [id number] Date of Birth: [birth year]  Today's Date: 12/10/2017 PT Individual Time: 1345-1445 PT Individual Time Calculation (min): 60 min    Skilled Therapeutic Interventions/Progress Updates:    Session focused on improving mobility and independence for hopeful D/C to home environment.  Pt with significant apraxia t/o session.  Pt required +2 assist to get out of vail bed due to lack of initiation.  Then mod to max A for sit to stand and transfer to wheelchair.  Pt then was able to ambulate in two bouts (75 ft and 30 ft) with HHA x 1-2 people with manual facilitation of weight shift through pelvis.  Upon initiation of stand to sit, pt requires significantly increased time to and manual cueing to process through the procedural components of a basic functional activity.  At request of OT and family, PT obtained tilt in space w/c, however, upon therapist attempting transfer to chair, pt not engaging LE with sit to stand.  Pt left in original w/c with lap belt in place with nursing notified of location and both sons present in the room.  Sons to call nursing to return pt to bed.  Therapy Documentation Precautions:  Precautions Precautions: Fall Precaution Comments: impaired vision Restrictions Weight Bearing Restrictions: No   See Function Navigator for Current Functional Status.   Therapy/Group: Individual Therapy  Leeanna Slaby Hilario Quarry 12/10/2017, 3:50 PM

## 2017-12-10 NOTE — IPOC Note (Signed)
Overall Plan of Care [facility]) Patient Details Name: Tammy Arnold MRN: [id number] DOB: July 05, 1941  Admitting Diagnosis: Balint's syndrome  Hospital Problems: Principal Problem:   Balint's syndrome Active Problems:   Essential hypertension   Ataxia   Hypokalemia     Functional Problem List: Nursing Behavior, Bladder, Bowel, Endurance, Medication Management, Motor, Pain, Nutrition, Perception, Safety, Skin Integrity, Sensory  PT Balance, Safety, Behavior, Endurance, Motor, Perception, Pain  OT Balance, Behavior, Vision, Endurance, Motor, Pain, Safety, Perception  SLP Cognition  TR         Basic ADL's: OT Grooming, Bathing, Dressing, Toileting     Advanced  ADL's: OT       Transfers: PT Bed Mobility, Bed to Chair, Car, Manufacturing systems engineer, Metallurgist: PT Ambulation, Data processing manager, Emergency planning/management officer     Additional Impairments: OT None  SLP Social Cognition   Problem Solving, Memory, Attention, Awareness  TR      Anticipated Outcomes Item Anticipated Outcome  Self Feeding n/a  Swallowing      Basic self-care  min A  Toileting  min A   Bathroom Transfers min A  Bowel/Bladder  Mod assist  Transfers  min assist with LRAD  Locomotion  min assist with LRAD  Communication     Cognition  Min A   Pain  Managed with pain medication and nonpharmacological measures  Safety/Judgment  Mod assist   Therapy Plan: PT Intensity: Minimum of 1-2 x/day ,45 to 90 minutes PT Frequency: 5 out of 7 days PT Duration Estimated Length of Stay: 2-3 weeks OT Intensity: Minimum of 1-2 x/day, 45 to 90 minutes OT Frequency: 5 out of 7 days OT Duration/Estimated Length of Stay: 21-24 days SLP Intensity: Minumum of 1-2 x/day, 30 to 90 minutes SLP Frequency: 3 to 5 out of 7 days SLP Duration/Estimated Length of Stay: 3-4 weeks     Team Interventions: Nursing Interventions Patient/Family Education, Bladder Management, Bowel Management, Pain Management, Medication  Management, Cognitive Remediation/Compensation, Discharge Planning, Psychosocial Support  PT interventions Ambulation/gait training, Community reintegration, DME/adaptive equipment instruction, Neuromuscular re-education, Psychosocial support, Stair training, UE/LE Strength taining/ROM, Wheelchair propulsion/positioning, UE/LE Coordination activities, Therapeutic Activities, Training and development officer, Discharge planning, Pain management, Skin care/wound management, Cognitive remediation/compensation, Disease management/prevention, Functional mobility training, Patient/family education, Therapeutic Exercise, Visual/perceptual remediation/compensation  OT Interventions Balance/vestibular training, Neuromuscular re-education, Self Care/advanced ADL retraining, Therapeutic Exercise, Cognitive remediation/compensation, DME/adaptive equipment instruction, Pain management, Skin care/wound managment, UE/LE Strength taining/ROM, Community reintegration, Barrister's clerk education, UE/LE Coordination activities, Discharge planning, Functional mobility training, Psychosocial support, Therapeutic Activities, Visual/perceptual remediation/compensation  SLP Interventions Cognitive remediation/compensation, Environmental controls, Internal/external aids, Therapeutic Activities, Patient/family education, Functional tasks, Cueing hierarchy  TR Interventions    SW/CM Interventions Discharge Planning, Psychosocial Support, Patient/Family Education   Barriers to Discharge MD  Behavior  Nursing Medical stability, Behavior, Incontinence    PT Lack of/limited family support, Other (comments) unsure if pt's family can provide 24 hr assistance; pt significantly limited by visual impairments  OT Other (comments) none known at this time  SLP      SW       Team Discharge Planning: Destination: PT-Home ,OT- Home , SLP-Home Projected Follow-up: PT-Home health PT, 24 hour supervision/assistance, OT-  Home health OT, SLP-Home  Health SLP, 24 hour supervision/assistance, Outpatient SLP Projected Equipment Needs: PT-To be determined, OT- To be determined, SLP-None recommended by SLP Equipment Details: PT- , OT-  Patient/family involved in discharge planning: PT- Patient,  OT-Patient, Family member/caregiver, SLP-Family member/caregiver, Patient  MD ELOS: 21-24  days Medical Rehab Prognosis:  Good Assessment: The patient has been admitted for CIR therapies with the diagnosis of Balint' syndrome with cognitive deficits, ataxia, apraxia . The team will be addressing functional mobility, strength, stamina, balance, safety, adaptive techniques and equipment, self-care, bowel and bladder mgt, patient and caregiver education, NMR, cognitive-perceptual rx, behavior, vestibular assessment. Goals have been set at min assist for basic self-care and mobility and cognition.    Meredith Staggers, MD, FAAPMR      See Team Conference Notes for weekly updates to the plan of care

## 2017-12-10 NOTE — Progress Notes (Signed)
Speech Language Pathology Daily Session Note  Patient Details  Name: Tammy Arnold MRN: [id number] Date of Birth: 1941/07/29  Today's Date: 12/10/2017 SLP Individual Time: 1025-8527 SLP Individual Time Calculation (min): 42 min  Short Term Goals: Week 1: SLP Short Term Goal 1 (Week 1): Patient will self-monitor and correct errors during functional tasks with Max A multimodal cues.  SLP Short Term Goal 2 (Week 1): Patient will visually scan to locate specific items during functional tasks with Max A multimodal cues.  SLP Short Term Goal 4 (Week 1): Patient will demonstrate functional problem solving with functional and familair tasks with Max A multimodal cues.  SLP Short Term Goal 5 (Week 1): Patient will demonstrate selective attention to functional tasks for ~30 minutes with Min A verbal cues for redirection.   Skilled Therapeutic Interventions: Skilled ST services focused on cognitive skills. Pt was unable to recount reason for hospitalization, pt's husband recounted information and pt demonstrated recall in response to yes/no questions. SLP facilitated basic problem solving and scanning to locate items with initially 3 step sequence cards, however scaled down to 2 step sequence cards and still required max-total assist to complete task with initial demonstration. SLP facilitated functional problem solving brushing teeth, pt required max-total assist including hand over hand to complete task. Pt was unable to spit out toothpaste/liquid given max A verbal and visual cues. Pt demonstrated awareness of errors with Max A verbal and visual cues, however only able to correct errors with hand over hand assistance. Pt demonstrated response to yes/no question pertaining to want/needs with Mod A verbal and visual cues. Pt was left in room with call bell within reach. Reccomend to continue skilled ST services.     Function:  Eating Eating                 Cognition Comprehension Comprehension  assist level: Understands basic less than 25% of the time/ requires cueing >75% of the time  Expression   Expression assist level: Expresses basis less than 25% of the time/requires cueing >75% of the time.  Social Interaction Social Interaction assist level: Interacts appropriately less than 25% of the time. May be withdrawn or combative.  Problem Solving Problem solving assist level: Solves basic less than 25% of the time - needs direction nearly all the time or does not effectively solve problems and may need a restraint for safety  Memory Memory assist level: Recognizes or recalls less than 25% of the time/requires cueing greater than 75% of the time    Pain Pain Assessment Pain Assessment: No/denies pain Faces Pain Scale: Hurts little more Pain Type: Chronic pain Pain Location: Back Pain Orientation: Mid;Lower Pain Descriptors / Indicators: Aching;Sharp Pain Onset: On-going Patients Stated Pain Goal: Other (Comment)(pt unable to state goal) Pain Intervention(s): Repositioned;Heat applied;Emotional support  Therapy/Group: Individual Therapy  [name]  [facility] 12/10/2017, 11:24 AM

## 2017-12-10 NOTE — Progress Notes (Signed)
[facility] PHYSICAL MEDICINE & REHABILITATION     PROGRESS NOTE    Subjective/Complaints: No new issues overnight.  Sleeping soundly when I entered the room.  Slow to arouse today  ROS: Limited due to cognitive/behavioral   Objective: Vital Signs: Blood pressure (!) 140/56, pulse 80, temperature 98.9 F (37.2 C), temperature source Oral, resp. rate 18, SpO2 100 %. Vas [place] Lower Extremity Venous (dvt)  Result Date: 12/09/2017  Lower Venous Study Indication: Swelling. Examination Guidelines: A complete evaluation includes B-mode imaging, spectral doppler, color doppler, and power doppler as needed of all accessible portions of each vessel. Bilateral testing is considered an integral part of a complete examination. Limited examinations for reoccurring indications may be performed as noted.  Right Venous Findings: +---------+---------------+---------+-----------+----------+-------+          CompressibilityPhasicitySpontaneityPropertiesSummary +---------+---------------+---------+-----------+----------+-------+ CFV      Full           Yes      Yes                          +---------+---------------+---------+-----------+----------+-------+ FV Prox  Full                                                 +---------+---------------+---------+-----------+----------+-------+ FV Mid   Full                                                 +---------+---------------+---------+-----------+----------+-------+ FV DistalFull                                                 +---------+---------------+---------+-----------+----------+-------+ PFV      Full                                                 +---------+---------------+---------+-----------+----------+-------+ POP      Full           Yes      Yes                          +---------+---------------+---------+-----------+----------+-------+ PTV      Full                                                  +---------+---------------+---------+-----------+----------+-------+ PERO     Full                                                 +---------+---------------+---------+-----------+----------+-------+ GSV      Full                                                 +---------+---------------+---------+-----------+----------+-------+  SSV      Full                                                 +---------+---------------+---------+-----------+----------+-------+  Left Venous Findings: +---------+---------------+---------+-----------+----------+-------+          CompressibilityPhasicitySpontaneityPropertiesSummary +---------+---------------+---------+-----------+----------+-------+ CFV      Full           Yes      Yes                          +---------+---------------+---------+-----------+----------+-------+ FV Prox  Full                                                 +---------+---------------+---------+-----------+----------+-------+ FV Mid   Full                                                 +---------+---------------+---------+-----------+----------+-------+ FV DistalFull                                                 +---------+---------------+---------+-----------+----------+-------+ PFV      Full                                                 +---------+---------------+---------+-----------+----------+-------+ POP      Full           Yes      Yes                          +---------+---------------+---------+-----------+----------+-------+ PTV      Full                                                 +---------+---------------+---------+-----------+----------+-------+ PERO     Full                                                 +---------+---------------+---------+-----------+----------+-------+ GSV      Full                                                  +---------+---------------+---------+-----------+----------+-------+ SSV      Full                                                 +---------+---------------+---------+-----------+----------+-------+  Final Interpretation: Right: There is no evidence of deep vein thrombosis in the lower extremity.There is no evidence of superficial venous thrombosis. No cystic structure found in the popliteal fossa. Left: There is no evidence of deep vein thrombosis in the lower extremity.There is no evidence of superficial venous thrombosis. No cystic structure found in the popliteal fossa.  *See table(s) above for measurements and observations. Electronically signed by Deitra Mayo on 12/09/2017 at 3:22:49 PM.     Recent Labs    12/08/17 0644  WBC 10.5  HGB 12.1  HCT 36.3  PLT 269   Recent Labs    12/08/17 0644  NA 140  K 3.7  CL 105  GLUCOSE 116*  BUN 11  CREATININE 0.69  CALCIUM 8.9   CBG (last 3)  No results for input(s): GLUCAP in the last 72 hours.  Wt Readings from Last 3 Encounters:  12/04/17 56.7 kg (125 lb)  07/18/15 56.7 kg (125 lb)    Physical Exam:  Nursing note and vitals reviewed. Constitutional: She appears well-developed and well-nourished. No distress.  HENT:  Head: Normocephalic and atraumatic.  Eyes: Right eye exhibits no discharge. Left eye exhibits no discharge.    Neck: Normal range of motion. Neck supple.  Cardiovascular:RRR without murmur. No JVD  Respiratory: CTA Bilaterally without wheezes or rales. Normal effort    GI: Soft. Bowel sounds are normal. She exhibits no distension.  Musculoskeletal:  No edema or tenderness in extremities  Neurological: She is alert.    Kept eyes closed today. Moves all 4's.   Skin: Skin is warm and dry. She is not diaphoretic.  Psychiatric: flat/blunt    Assessment/Plan: 1.  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 Physiatrist is providing close team supervision and 24 hour management of active medical problems listed below. Physiatrist and rehab team continue to assess barriers to discharge/monitor patient progress toward functional and medical goals.  Function:  Bathing Bathing position   Position: Sitting EOB  Bathing parts Body parts bathed by patient: Chest Body parts bathed by helper: Right arm, Left arm, Abdomen, Front perineal area, Buttocks, Right upper leg, Left upper leg, Right lower leg, Left lower leg, Back  Bathing assist Assist Level: (total A)      Upper Body Dressing/Undressing Upper body dressing   What is the patient wearing?: Pull over shirt/dress       Pull over shirt/dress - Perfomed by helper: Thread/unthread right sleeve, Thread/unthread left sleeve, Put head through opening, Pull shirt over trunk        Upper body assist Assist Level: (total A)      Lower Body Dressing/Undressing Lower body dressing   What is the patient wearing?: Pants, Non-skid slipper socks       Pants- Performed by helper: Thread/unthread right pants leg, Thread/unthread left pants leg, Pull pants up/down   Non-skid slipper socks- Performed by helper: Don/doff right sock, Don/doff left sock                  Lower body assist Assist for lower body dressing: (total A)      Toileting Toileting Toileting activity did not occur: No continent bowel/bladder event   Toileting steps completed by helper: Adjust clothing prior to toileting, Performs perineal hygiene, Adjust clothing after toileting Toileting Assistive Devices: Grab bar or rail  Toileting assist Assist level: Two helpers   Transfers Chair/bed transfer Chair/bed transfer activity  did not occur: Safety/medical concerns Chair/bed transfer method: Stand pivot Chair/bed transfer assist level: Maximal assist (Pt 25 - 49%/lift and lower)       Locomotion Ambulation     Max distance:  300 ft Assist level: Touching or steadying assistance (Pt > 75%)   Wheelchair Wheelchair activity did not occur: Safety/medical concerns        Cognition Comprehension Comprehension assist level: Understands basic less than 25% of the time/ requires cueing >75% of the time  Expression Expression assist level: Expresses basis less than 25% of the time/requires cueing >75% of the time.  Social Interaction Social Interaction assist level: Interacts appropriately less than 25% of the time. May be withdrawn or combative.  Problem Solving Problem solving assist level: Solves basic less than 25% of the time - needs direction nearly all the time or does not effectively solve problems and may need a restraint for safety  Memory Memory assist level: Recognizes or recalls less than 25% of the time/requires cueing greater than 75% of the time   Medical Problem List and Plan: 1.  Deficits with mobility, transfers, self-care, cognition secondary to Balint's syndrome.   -Continue therapies.  Working on balance and mobility.  Looking for day to day carryover. 2.  DVT Prophylaxis/Anticoagulation: Pharmaceutical: Lovenox   -dopplers pending today 3. Severe low back pain/Pain Management: tylenol prn   -kpad 4. Mood: Team to provide ego support. LCSW to follow with patient and husband for support.   5. Neuropsych: This patient is not capable of making decisions on her own behalf.   -Enclosure bed required for safety 6. Skin/Wound Care: routine pressure relief measures.  7. Fluids/Electrolytes/Nutrition: Monitor I/O. Offer supplements. Assist with meals due to visual deficits.   -Encourage intake and track ins and outs closely.   -Replace potassium as needed.  3.7 8. HTN: Monitor BP bid for trends--may need agent for better control.   9. Fever: generally low grade   - BC X 2 still pending. 2/13 UCS with multispecies, chest x-ray reviewed and is negative   -dopplers pending   -Continue to observe  LOS  (Days) 3 A FACE TO FACE EVALUATION WAS PERFORMED  Meredith Staggers, MD 12/10/2017 8:46 AM

## 2017-12-11 ENCOUNTER — Other Ambulatory Visit: Payer: Self-pay

## 2017-12-11 ENCOUNTER — Inpatient Hospital Stay (HOSPITAL_COMMUNITY): Payer: Medicare Other | Admitting: Occupational Therapy

## 2017-12-11 MED ORDER — BETHANECHOL CHLORIDE 10 MG PO TABS
10.0000 mg | ORAL_TABLET | Freq: Three times a day (TID) | ORAL | Status: DC
  Administered 2017-12-11 – 2017-12-21 (×30): 10 mg via ORAL
  Filled 2017-12-11 (×30): qty 1

## 2017-12-11 MED ORDER — PREDNISOLONE ACETATE 1 % OP SUSP
1.0000 [drp] | Freq: Two times a day (BID) | OPHTHALMIC | Status: AC
  Administered 2017-12-11 – 2017-12-14 (×7): 1 [drp] via OPHTHALMIC
  Filled 2017-12-11: qty 1

## 2017-12-11 MED ORDER — OFLOXACIN 0.3 % OP SOLN
1.0000 [drp] | Freq: Two times a day (BID) | OPHTHALMIC | Status: AC
  Administered 2017-12-11 – 2017-12-14 (×7): 1 [drp] via OPHTHALMIC
  Filled 2017-12-11: qty 5

## 2017-12-11 NOTE — Progress Notes (Signed)
Occupational Therapy Session Note  Patient Details  Name: Tammy Arnold MRN: [id number] Date of Birth: 12-20-1940  Today's Date: 12/11/2017 OT Individual Time: 7829-5621 and 3086-5784 OT Individual Time Calculation (min): 63 min and 45 min  Short Term Goals: Week 1:  OT Short Term Goal 1 (Week 1): Pt will scan to items to locate items needed for self care with mod multimodal cues.  OT Short Term Goal 2 (Week 1): Pt will perform toileting with max A to decrease level of assist with self care. OT Short Term Goal 3 (Week 1): Pt will perform toilet transfer with mod A for 3 consecutive sessions.  Skilled Therapeutic Interventions/Progress Updates:    Pt greeted supine in bed with spouse present. Breakfast tray had just arrived. Pt completed supine<sit with 2 helpers due to pain/motor planning deficits. Stand pivot<TIS completed with 2 helpers due to pts aversion to forward weight shifting. Provided calming cues for pt to assist with standing (which she did with Mod A). Worked on attention, perception, scanning, awareness, and coordination during self feeding. Pt requiring HOH assist 75% of time to accurately bring food to mouth using utensils. HOH for bilaterally integrating UEs for beverages and fruit cup. Pt with increased success rate piercing food when items were nearer to body and to the Lt side. Spouse present and educated him on concept of pain maps/mind-body connection with pain perception. Encouraged him to use therapeutic distraction when pt perseverated on pain. During session, pt occasionally flinched/jerked at random and always when leaning forward. Pt amenable when redirected to task and engaging in conversation with therapist. Handwashing and hair brushing completed w/c level at sink afterwards with [facility] assist. Pt still severely apraxic and unable to consistently follow 1 step instruction. Afterwards, educated spouse Gershon Mussel on methods for tilting TIS in order to maximize pts comfort while  OOB. Pt reclined in TIS with heating pad on back, safety belt fastened and spouse present at session exit.   2nd Session 1:1 tx (45 min) Pt greeted reclined in TIS, dressed, amenable to tx. Worked on coordination, attention, and motor planning during oral care/grooming tasks at sink. Pt requiring HOH assist to execute 95% of motor tasks, though was able to cap/uncap soap bottle and toothpaste with cuing and items placed in hand! Pt still grimacing and aversive to forward weight shifting due to pain, and therefore was unable to expectorate in sink. Used paper cup instead. Afterwards escorted pt to dayroom and played her favorite Amedeo Plenty music. Pt murmuring tunes of songs, required [facility] assist to clap in beat to music to work on UE coordination, sequencing, and attention. She was then escorted back to room, reclined in TIS with safety belt fastened. Continued playing Amedeo Plenty at low volume in room to promote relaxation/therapeutic distraction from pain.   Therapy Documentation Precautions:  Precautions Precautions: Fall Precaution Comments: impaired vision Restrictions Weight Bearing Restrictions: No Pain: Written above. RN made aware of pts c/o pain   ADL:  :   See Function Navigator for Current Functional Status.   Therapy/Group: Individual Therapy  Onie Hayashi A Marquist Binstock 12/11/2017, 3:58 PM

## 2017-12-11 NOTE — Progress Notes (Signed)
Pt had not voided in 8 hrs. Bladder scan 365 mL. Pt assisted to toilet to attempt to urinate. Pt. sat on toilet for 20 minutes, with water running to encourage voiding but unsuccessful. Attempted to I&O cath pt but unable. Pt very resistive, combative. Spoke with Dr. Naaman Plummer, new order received for bethanechol 10 mg NOW and TID.

## 2017-12-11 NOTE — Progress Notes (Signed)
[place] PHYSICAL MEDICINE & REHABILITATION     PROGRESS NOTE    Subjective/Complaints: No new complaints per staff.  Patient slow to arouse again this morning  ROS: Limited due to cognitive/behavioral    Objective: Vital Signs: Blood pressure (!) 162/80, pulse 84, temperature 98.2 F (36.8 C), temperature source Oral, resp. rate 18, SpO2 99 %. Vas [place] Lower Extremity Venous (dvt)  Result Date: 12/09/2017  Lower Venous Study Indication: Swelling. Examination Guidelines: A complete evaluation includes B-mode imaging, spectral doppler, color doppler, and power doppler as needed of all accessible portions of each vessel. Bilateral testing is considered an integral part of a complete examination. Limited examinations for reoccurring indications may be performed as noted.  Right Venous Findings: +---------+---------------+---------+-----------+----------+-------+          CompressibilityPhasicitySpontaneityPropertiesSummary +---------+---------------+---------+-----------+----------+-------+ CFV      Full           Yes      Yes                          +---------+---------------+---------+-----------+----------+-------+ FV Prox  Full                                                 +---------+---------------+---------+-----------+----------+-------+ FV Mid   Full                                                 +---------+---------------+---------+-----------+----------+-------+ FV DistalFull                                                 +---------+---------------+---------+-----------+----------+-------+ PFV      Full                                                 +---------+---------------+---------+-----------+----------+-------+ POP      Full           Yes      Yes                          +---------+---------------+---------+-----------+----------+-------+ PTV      Full                                                  +---------+---------------+---------+-----------+----------+-------+ PERO     Full                                                 +---------+---------------+---------+-----------+----------+-------+ GSV      Full                                                 +---------+---------------+---------+-----------+----------+-------+  SSV      Full                                                 +---------+---------------+---------+-----------+----------+-------+  Left Venous Findings: +---------+---------------+---------+-----------+----------+-------+          CompressibilityPhasicitySpontaneityPropertiesSummary +---------+---------------+---------+-----------+----------+-------+ CFV      Full           Yes      Yes                          +---------+---------------+---------+-----------+----------+-------+ FV Prox  Full                                                 +---------+---------------+---------+-----------+----------+-------+ FV Mid   Full                                                 +---------+---------------+---------+-----------+----------+-------+ FV DistalFull                                                 +---------+---------------+---------+-----------+----------+-------+ PFV      Full                                                 +---------+---------------+---------+-----------+----------+-------+ POP      Full           Yes      Yes                          +---------+---------------+---------+-----------+----------+-------+ PTV      Full                                                 +---------+---------------+---------+-----------+----------+-------+ PERO     Full                                                 +---------+---------------+---------+-----------+----------+-------+ GSV      Full                                                  +---------+---------------+---------+-----------+----------+-------+ SSV      Full                                                 +---------+---------------+---------+-----------+----------+-------+  Final Interpretation: Right: There is no evidence of deep vein thrombosis in the lower extremity.There is no evidence of superficial venous thrombosis. No cystic structure found in the popliteal fossa. Left: There is no evidence of deep vein thrombosis in the lower extremity.There is no evidence of superficial venous thrombosis. No cystic structure found in the popliteal fossa.  *See table(s) above for measurements and observations. Electronically signed by Deitra Mayo on 12/09/2017 at 3:22:49 PM.     No results for input(s): WBC, HGB, HCT, PLT in the last 72 hours. No results for input(s): NA, K, CL, GLUCOSE, BUN, CREATININE, CALCIUM in the last 72 hours.  Invalid input(s): CO CBG (last 3)  No results for input(s): GLUCAP in the last 72 hours.  Wt Readings from Last 3 Encounters:  12/04/17 56.7 kg (125 lb)  07/18/15 56.7 kg (125 lb)    Physical Exam:  Nursing note and vitals reviewed. Constitutional: She appears well-developed and well-nourished. No distress.  HENT:  Head: Normocephalic and atraumatic.  Eyes: Right eye exhibits no discharge. Left eye exhibits no discharge.    Neck: Normal range of motion. Neck supple.  Cardiovascular:RRR without murmur. No JVD   Respiratory: CTA Bilaterally without wheezes or rales. Normal effort    GI: Soft. Bowel sounds are normal. She exhibits no distension.  Musculoskeletal:  No edema or tenderness in extremities  Neurological: She is alert.    Kept eyes closed today. Moves all 4's.   Skin: Skin is warm and dry. She is not diaphoretic.  Psychiatric: flat/blunt    Assessment/Plan: 1.  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 Physiatrist is providing close team supervision and 24 hour management of active medical problems listed below. Physiatrist and rehab team continue to assess barriers to discharge/monitor patient progress toward functional and medical goals.  Function:  Bathing Bathing position   Position: Shower  Bathing parts Body parts bathed by patient: Right upper leg, Left upper leg Body parts bathed by helper: Right arm, Left arm, Abdomen, Front perineal area, Buttocks, Right lower leg, Left lower leg, Back, Chest  Bathing assist Assist Level: (total A)      Upper Body Dressing/Undressing Upper body dressing   What is the patient wearing?: Pull over shirt/dress       Pull over shirt/dress - Perfomed by helper: Thread/unthread right sleeve, Thread/unthread left sleeve, Put head through opening, Pull shirt over trunk        Upper body assist Assist Level: (total A)      Lower Body Dressing/Undressing Lower body dressing   What is the patient wearing?: Pants, Non-skid slipper socks       Pants- Performed by helper: Thread/unthread right pants leg, Thread/unthread left pants leg, Pull pants up/down   Non-skid slipper socks- Performed by helper: Don/doff right sock, Don/doff left sock                  Lower body assist Assist for lower body dressing: (total A)      Toileting Toileting Toileting activity did not occur: No continent bowel/bladder event   Toileting steps completed by helper: Adjust clothing prior to toileting, Performs perineal hygiene, Adjust clothing after toileting Toileting Assistive Devices: Grab bar or rail  Toileting assist Assist level: Two helpers   Transfers Chair/bed transfer Chair/bed transfer activity did not occur: Safety/medical concerns Chair/bed transfer method: Stand pivot Chair/bed transfer assist level: Maximal assist (Pt 25 -  49%/lift and lower) Chair/bed transfer assistive device: Armrests      Locomotion Ambulation     Max distance: (75 ft) Assist level: 2 helpers(+2 to for w/c push.  Min A for gait)   Wheelchair Wheelchair activity did not occur: Safety/medical concerns        Cognition Comprehension Comprehension assist level: Understands basic less than 25% of the time/ requires cueing >75% of the time  Expression Expression assist level: Expresses basis less than 25% of the time/requires cueing >75% of the time.  Social Interaction Social Interaction assist level: Interacts appropriately less than 25% of the time. May be withdrawn or combative.  Problem Solving Problem solving assist level: Solves basic less than 25% of the time - needs direction nearly all the time or does not effectively solve problems and may need a restraint for safety  Memory Memory assist level: Recognizes or recalls less than 25% of the time/requires cueing greater than 75% of the time   Medical Problem List and Plan: 1.  Deficits with mobility, transfers, self-care, cognition secondary to Balint's syndrome.   -Continue therapies.  Working on balance and mobility.  Looking for day to day carryover. 2.  DVT Prophylaxis/Anticoagulation: Pharmaceutical: Lovenox   -dopplers negative 3. Severe low back pain/Pain Management: tylenol prn   -kpad 4. Mood: Team to provide ego support. LCSW to follow with patient and husband for support.   5. Neuropsych: This patient is not capable of making decisions on her own behalf.   -Enclosure bed still required for safety 6. Skin/Wound Care: routine pressure relief measures.  7. Fluids/Electrolytes/Nutrition: Monitor I/O. Offer supplements. Assist with meals due to visual deficits.   -Encourage intake and track ins and outs closely.   -Replace potassium as needed.  3.7 8. HTN: Monitor BP bid for trends--may need agent for better control.   9. Fever: Afebrile this morning   - BC X 2 still pending. 2/13 UCS with multispecies, chest x-ray reviewed and is negative    -dopplers negative   -Continue to observe  LOS (Days) 4 A FACE TO FACE EVALUATION WAS PERFORMED  Meredith Staggers, MD 12/11/2017 9:35 AM

## 2017-12-12 ENCOUNTER — Inpatient Hospital Stay (HOSPITAL_COMMUNITY): Payer: Medicare Other | Admitting: Physical Therapy

## 2017-12-12 ENCOUNTER — Inpatient Hospital Stay (HOSPITAL_COMMUNITY): Payer: Medicare Other | Admitting: Speech Pathology

## 2017-12-12 ENCOUNTER — Inpatient Hospital Stay (HOSPITAL_COMMUNITY): Payer: Medicare Other | Admitting: Occupational Therapy

## 2017-12-12 LAB — CULTURE, BLOOD (ROUTINE X 2)
Culture: NO GROWTH
Culture: NO GROWTH
SPECIAL REQUESTS: ADEQUATE
Special Requests: ADEQUATE

## 2017-12-12 MED ORDER — ASPIRIN 81 MG PO CHEW
81.0000 mg | CHEWABLE_TABLET | Freq: Every day | ORAL | Status: DC
  Administered 2017-12-12 – 2017-12-27 (×16): 81 mg via ORAL
  Filled 2017-12-12 (×16): qty 1

## 2017-12-12 MED ORDER — LIDOCAINE 5 % EX PTCH
1.0000 | MEDICATED_PATCH | CUTANEOUS | Status: DC
  Administered 2017-12-13 – 2017-12-27 (×15): 1 via TRANSDERMAL
  Filled 2017-12-12 (×15): qty 1

## 2017-12-12 NOTE — Progress Notes (Signed)
Speech Language Pathology Daily Session Note  Patient Details  Name: Tammy Arnold MRN: [id number] Date of Birth: 29-Aug-1941  Today's Date: 12/12/2017 SLP Individual Time: 0730-0830 SLP Individual Time Calculation (min): 60 min  Short Term Goals: Week 1: SLP Short Term Goal 1 (Week 1): Patient will self-monitor and correct errors during functional tasks with Max A multimodal cues.  SLP Short Term Goal 2 (Week 1): Patient will visually scan to locate specific items during functional tasks with Max A multimodal cues.  SLP Short Term Goal 4 (Week 1): Patient will demonstrate functional problem solving with functional and familair tasks with Max A multimodal cues.  SLP Short Term Goal 5 (Week 1): Patient will demonstrate selective attention to functional tasks for ~30 minutes with Min A verbal cues for redirection.   Skilled Therapeutic Interventions: Skilled treatment session focused on cognitive goals. SLP facilitated session by providing total A for orientation and problem solving in regards to donning a brief/pants and sitting EOB to transfer to wheelchair. Patient minimally resistive, suspect due to pain and decreased proprioception. Patient required Max-Total A for problem solving for tray set-up and Mod-Max A verbal cues for problem solving during self-feeding. [name] function is impacted by severe apraxia. Patient demonstrated emergent awareness of confusion but could not infer how confusion is skewing perception of staff's intentions in trying to care for her basic needs. Patient left upright in wheelchair at RN station with chair alarm on and quick release belt in place. Continue with current plan of care.       Function:  Eating Eating   Modified Consistency Diet: No Eating Assist Level: More than reasonable amount of time;Set up assist for;Supervision or verbal cues;Help managing cup/glass   Eating Set Up Assist For: Opening containers;Cutting food        Cognition Comprehension Comprehension assist level: Understands basic less than 25% of the time/ requires cueing >75% of the time  Expression   Expression assist level: Expresses basis less than 25% of the time/requires cueing >75% of the time.  Social Interaction Social Interaction assist level: Interacts appropriately less than 25% of the time. May be withdrawn or combative.  Problem Solving Problem solving assist level: Solves basic less than 25% of the time - needs direction nearly all the time or does not effectively solve problems and may need a restraint for safety  Memory Memory assist level: Recognizes or recalls less than 25% of the time/requires cueing greater than 75% of the time    Pain Pain in back, patient repositioned   Therapy/Group: Individual Therapy  Shepherd Finnan 12/12/2017, 8:50 AM

## 2017-12-12 NOTE — Progress Notes (Signed)
Occupational Therapy Session Note  Patient Details  Name: Tammy Arnold MRN: [id number] Date of Birth: 08-08-1941  Today's Date: 12/12/2017 OT Individual Time: 2694-8546 and 1345-1444 OT Individual Time Calculation (min): 45 min and 59 min    Short Term Goals: Week 1:  OT Short Term Goal 1 (Week 1): Pt will scan to items to locate items needed for self care with mod multimodal cues.  OT Short Term Goal 2 (Week 1): Pt will perform toileting with max A to decrease level of assist with self care. OT Short Term Goal 3 (Week 1): Pt will perform toilet transfer with mod A for 3 consecutive sessions.  Skilled Therapeutic Interventions/Progress Updates:    Session 1: Pt received from PT and pt transitioned without difficulty. Pt agreeable to OT intervention this session. Pt oriented to self and location only this session. Pt continued to say, " I'm just messed up."  Pt very fatigued at this time and assisted back to room. Pt transferred with mod A from wheelchair >bed with stand pivot transfer. Pt requiring increased time and increased verbal cues to sequencing. Pt initiating movement on her own for transition. Pt unable to sequence sit>supine and required assistance with B LEs and trunk . Pt with increased back pain during this movement but positioned for relief. Pt in bed lowered to floor with mats places on floor for safety and RN present giving medications as therapist exited the room.   Session 2: Upon entering the room, pt seated in wheelchair with c/o lower back pain and RN aware.  Medications given this session. Pt agreeable to grooming at sink while seated. OT set up toothbrush and placed in R hand. Pt given mod multimodal cues to brush teeth but then pt was unable to terminate task after several minutes. Pt was able to spit from mouth and rinse with max cuing and increased time. Pt washing face with set up A and min cues this session. UB bathing and dressing performed while seated in wheelchair  at sink. Pt unable to follow commands to wash self and unable to engage in hand over hand task as pt comes very resistive. Hospital gown donned with total A and pt remained seated in wheelchair. Pt tilted, chair alarm activated, and quick release belt donned. Pt's favorite singer, Amedeo Plenty, planning and pt calm and singing along to music. RN notified of pt's position as therapist exited the room.   Therapy Documentation Precautions:  Precautions Precautions: Fall Precaution Comments: impaired vision Restrictions Weight Bearing Restrictions: No General:   Vital Signs: Therapy Vitals Temp: 98 F (36.7 C) Temp Source: Oral Pulse Rate: 77 Resp: 18 BP: (!) 142/71 Patient Position (if appropriate): Sitting Oxygen Therapy SpO2: 100 % O2 Device: Not Delivered  See Function Navigator for Current Functional Status.   Therapy/Group: Individual Therapy  Gypsy Decant 12/12/2017, 4:46 PM

## 2017-12-12 NOTE — Progress Notes (Signed)
[place] PHYSICAL MEDICINE & REHABILITATION     PROGRESS NOTE    Subjective/Complaints: Up eating breakfast this morning.  Therapy sitting next to her.  Denies any complaints, states that back is feeling a bit better this morning.  ROS: pt denies nausea, vomiting, diarrhea, cough, shortness of breath or chest pain    Objective: Vital Signs: Blood pressure (!) 108/45, pulse 76, temperature 98.5 F (36.9 C), temperature source Axillary, resp. rate 17, height 5\' 4"  (1.626 m), weight 56 kg (123 lb 7.3 oz), SpO2 98 %. Vas [place] Lower Extremity Venous (dvt)  Result Date: 12/09/2017  Lower Venous Study Indication: Swelling. Examination Guidelines: A complete evaluation includes B-mode imaging, spectral doppler, color doppler, and power doppler as needed of all accessible portions of each vessel. Bilateral testing is considered an integral part of a complete examination. Limited examinations for reoccurring indications may be performed as noted.  Right Venous Findings: +---------+---------------+---------+-----------+----------+-------+          CompressibilityPhasicitySpontaneityPropertiesSummary +---------+---------------+---------+-----------+----------+-------+ CFV      Full           Yes      Yes                          +---------+---------------+---------+-----------+----------+-------+ FV Prox  Full                                                 +---------+---------------+---------+-----------+----------+-------+ FV Mid   Full                                                 +---------+---------------+---------+-----------+----------+-------+ FV DistalFull                                                 +---------+---------------+---------+-----------+----------+-------+ PFV      Full                                                 +---------+---------------+---------+-----------+----------+-------+ POP      Full           Yes      Yes                           +---------+---------------+---------+-----------+----------+-------+ PTV      Full                                                 +---------+---------------+---------+-----------+----------+-------+ PERO     Full                                                 +---------+---------------+---------+-----------+----------+-------+ GSV  Full                                                 +---------+---------------+---------+-----------+----------+-------+ SSV      Full                                                 +---------+---------------+---------+-----------+----------+-------+  Left Venous Findings: +---------+---------------+---------+-----------+----------+-------+          CompressibilityPhasicitySpontaneityPropertiesSummary +---------+---------------+---------+-----------+----------+-------+ CFV      Full           Yes      Yes                          +---------+---------------+---------+-----------+----------+-------+ FV Prox  Full                                                 +---------+---------------+---------+-----------+----------+-------+ FV Mid   Full                                                 +---------+---------------+---------+-----------+----------+-------+ FV DistalFull                                                 +---------+---------------+---------+-----------+----------+-------+ PFV      Full                                                 +---------+---------------+---------+-----------+----------+-------+ POP      Full           Yes      Yes                          +---------+---------------+---------+-----------+----------+-------+ PTV      Full                                                 +---------+---------------+---------+-----------+----------+-------+ PERO     Full                                                  +---------+---------------+---------+-----------+----------+-------+ GSV      Full                                                 +---------+---------------+---------+-----------+----------+-------+  SSV      Full                                                 +---------+---------------+---------+-----------+----------+-------+    Final Interpretation: Right: There is no evidence of deep vein thrombosis in the lower extremity.There is no evidence of superficial venous thrombosis. No cystic structure found in the popliteal fossa. Left: There is no evidence of deep vein thrombosis in the lower extremity.There is no evidence of superficial venous thrombosis. No cystic structure found in the popliteal fossa.  *See table(s) above for measurements and observations. Electronically signed by Deitra Mayo on 12/09/2017 at 3:22:49 PM.     No results for input(s): WBC, HGB, HCT, PLT in the last 72 hours. No results for input(s): NA, K, CL, GLUCOSE, BUN, CREATININE, CALCIUM in the last 72 hours.  Invalid input(s): CO CBG (last 3)  No results for input(s): GLUCAP in the last 72 hours.  Wt Readings from Last 3 Encounters:  12/07/17 56 kg (123 lb 7.3 oz)  12/04/17 56.7 kg (125 lb)  07/18/15 56.7 kg (125 lb)    Physical Exam:  Nursing note and vitals reviewed. Constitutional: She appears well-developed and well-nourished. No distress.  HENT:  Head: Normocephalic and atraumatic.  Eyes: Right eye exhibits no discharge. Left eye exhibits no discharge.    Neck: Normal range of motion. Neck supple.  Cardiovascular:RRR   Respiratory: Normal effort GI: Soft. Bowel sounds are normal. She exhibits no distension.  Musculoskeletal:  No edema or tenderness in extremities  Neurological: Alert.  Can tell me where she was when given choices.  Remembered her husband's occupation. Skin: Skin is warm and dry. She is not diaphoretic.  Psychiatric: Pleasant and cooperative    Assessment/Plan: 1.   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 Physiatrist is providing close team supervision and 24 hour management of active medical problems listed below. Physiatrist and rehab team continue to assess barriers to discharge/monitor patient progress toward functional and medical goals.  Function:  Bathing Bathing position   Position: Shower  Bathing parts Body parts bathed by patient: Right upper leg, Left upper leg Body parts bathed by helper: Right arm, Left arm, Abdomen, Front perineal area, Buttocks, Right lower leg, Left lower leg, Back, Chest  Bathing assist Assist Level: (total A)      Upper Body Dressing/Undressing Upper body dressing   What is the patient wearing?: Pull over shirt/dress       Pull over shirt/dress - Perfomed by helper: Thread/unthread right sleeve, Thread/unthread left sleeve, Put head through opening, Pull shirt over trunk        Upper body assist Assist Level: (total A)      Lower Body Dressing/Undressing Lower body dressing   What is the patient wearing?: Pants, Non-skid slipper socks       Pants- Performed by helper: Thread/unthread right pants leg, Thread/unthread left pants leg, Pull pants up/down   Non-skid slipper socks- Performed by helper: Don/doff right sock, Don/doff left sock                  Lower body assist Assist for lower body dressing: (total A)      Toileting Toileting Toileting activity did not occur: No continent bowel/bladder event  Toileting steps completed by helper: Adjust clothing prior to toileting, Performs perineal hygiene, Adjust clothing after toileting Toileting Assistive Devices: Grab bar or rail  Toileting assist Assist level: Two helpers   Transfers Chair/bed transfer Chair/bed transfer activity did not occur: Safety/medical concerns Chair/bed transfer method: Stand  pivot Chair/bed transfer assist level: Maximal assist (Pt 25 - 49%/lift and lower) Chair/bed transfer assistive device: Armrests     Locomotion Ambulation     Max distance: (75 ft) Assist level: 2 helpers(+2 to for w/c push.  Min A for gait)   Wheelchair Wheelchair activity did not occur: Safety/medical concerns        Cognition Comprehension Comprehension assist level: Understands basic less than 25% of the time/ requires cueing >75% of the time  Expression Expression assist level: Expresses basis less than 25% of the time/requires cueing >75% of the time.  Social Interaction Social Interaction assist level: Interacts appropriately less than 25% of the time. May be withdrawn or combative.  Problem Solving Problem solving assist level: Solves basic less than 25% of the time - needs direction nearly all the time or does not effectively solve problems and may need a restraint for safety  Memory Memory assist level: Recognizes or recalls less than 25% of the time/requires cueing greater than 75% of the time   Medical Problem List and Plan: 1.  Deficits with mobility, transfers, self-care, cognition secondary to Balint's syndrome.   -Continue therapies.  Working on balance and mobility.    2.  DVT Prophylaxis/Anticoagulation: Pharmaceutical: Lovenox   -dopplers negative 3. Severe low back pain/Pain Management: tylenol prn   -kpad 4. Mood: Team to provide ego support. LCSW to follow with patient and husband for support.   5. Neuropsych: This patient is not capable of making decisions on her own behalf.   -We will attempt to move to low bed today 6. Skin/Wound Care: routine pressure relief measures.  7. Fluids/Electrolytes/Nutrition: Monitor I/O. Offer supplements. Assist with meals due to visual deficits.   -Encourage intake and track ins and outs closely.   -Replace potassium as needed.  3.7 8. HTN: Monitor BP bid for trends--may need agent for better control.   9. Fever: Afebrile  again today   - BC X 2 still pending. 2/13 UCS with multispecies, chest x-ray reviewed and is negative   -dopplers negative   -Continue to observe  LOS (Days) 5 A FACE TO FACE EVALUATION WAS PERFORMED  Meredith Staggers, MD 12/12/2017 9:06 AM

## 2017-12-12 NOTE — Progress Notes (Signed)
Physical Therapy Session Note  Patient Details  Name: Tammy Arnold MRN: [id number] Date of Birth: 1941/04/05  Today's Date: 12/12/2017 PT Individual Time: 0349-1791 PT Individual Time Calculation (min): 58 min   Short Term Goals: Week 1:  PT Short Term Goal 1 (Week 1): Pt will complete furniture transfer from couch with mod assist. PT Short Term Goal 2 (Week 1): Pt will ambulate 125 ft with mod assist with LRAD. PT Short Term Goal 3 (Week 1): Pt will negotiate 4 steps with 1 rail with mod assist.  Skilled Therapeutic Interventions/Progress Updates:  Pt received in TIS w/c with RN present administering meds. Pt requires mod assist and simple 1 step commands to transfer to standing to allow RN to apply pain patch to back. Pt requires min assist for static standing balance. Pt ordered lunch & dinner with difficulty word finding and requiring choice of 2. Therapist adjusted head & leg rests for improved positioning. Transported pt to ortho gym via w/c total assist. Pt completes car transfer with min assist with improved ability to follow 1 step commands for transfer. Pt does require assistance to place RLE in car. Gait training x 25 ft with min/mod assist R HHA with very short step & stride length BLE and kyphotic posture. When transferring to sit on mat table pt demonstrates very poor motor planning and inability to turn towards mat thus requiring max multimodal cuing and facilitation to safely sit. Pt is able to transfer LE onto mat table with min assist to achieve long sitting position but requires max assist for long sit>supine 2/2 fear of falling, possible back pain, and inability to motor plan movement. Once in supine pt require MAX assist with MAX multimodal cuing to transition to prone as pt with very poor motor planning, spatial awareness & resistive to movements. Pt requires heavy education and cuing regarding safety with task and is eventually able to achieve prone & maintain this position  for ~5 minutes for extension stretch. Initially pt reports back discomfort but as time progressed pt reported "it feels good on my back" when in prone. Therapist provides multimodal cuing to achieve proper positioning as pt lying with hip elevated at at times. When transferring to sitting EOM pt very fearful 2/2 impaired proprioception & vision and appears to be fearful of pain with movement. Pt requires max/total assist for stand pivot mat>w/c as pt unable to sequence pivot. At end of session pt left in handoff to OT.  Therapy Documentation Precautions:  Precautions Precautions: Fall Precaution Comments: impaired vision Restrictions Weight Bearing Restrictions: No  Pain: Pt with behaviors demonstrating back pain.   See Function Navigator for Current Functional Status.   Therapy/Group: Individual Therapy  Waunita Schooner 12/12/2017, 11:11 AM

## 2017-12-13 ENCOUNTER — Inpatient Hospital Stay (HOSPITAL_COMMUNITY): Payer: Medicare Other | Admitting: Physical Therapy

## 2017-12-13 ENCOUNTER — Inpatient Hospital Stay (HOSPITAL_COMMUNITY): Payer: Medicare Other | Admitting: Occupational Therapy

## 2017-12-13 ENCOUNTER — Ambulatory Visit: Payer: Medicare Other | Admitting: Neurology

## 2017-12-13 ENCOUNTER — Inpatient Hospital Stay (HOSPITAL_COMMUNITY): Payer: Medicare Other | Admitting: Speech Pathology

## 2017-12-13 LAB — CBC
HCT: 35.5 % — ABNORMAL LOW (ref 36.0–46.0)
Hemoglobin: 11.2 g/dL — ABNORMAL LOW (ref 12.0–15.0)
MCH: 27.2 pg (ref 26.0–34.0)
MCHC: 31.5 g/dL (ref 30.0–36.0)
MCV: 86.2 fL (ref 78.0–100.0)
PLATELETS: 379 10*3/uL (ref 150–400)
RBC: 4.12 MIL/uL (ref 3.87–5.11)
RDW: 13.3 % (ref 11.5–15.5)
WBC: 9.2 10*3/uL (ref 4.0–10.5)

## 2017-12-13 NOTE — Progress Notes (Signed)
Speech Language Pathology Daily Session Note  Patient Details  Name: Tammy Arnold MRN: [id number] Date of Birth: 05/31/1941  Today's Date: 12/13/2017 SLP Individual Time: 0830-0930 SLP Individual Time Calculation (min): 60 min  Short Term Goals: Week 1: SLP Short Term Goal 1 (Week 1): Patient will self-monitor and correct errors during functional tasks with Max A multimodal cues.  SLP Short Term Goal 2 (Week 1): Patient will visually scan to locate specific items during functional tasks with Max A multimodal cues.  SLP Short Term Goal 4 (Week 1): Patient will demonstrate functional problem solving with functional and familair tasks with Max A multimodal cues.  SLP Short Term Goal 5 (Week 1): Patient will demonstrate selective attention to functional tasks for ~30 minutes with Min A verbal cues for redirection.   Skilled Therapeutic Interventions: Skilled treatment session focused on cognitive goals. SLP facilitated session by providing Max - Total A verbal cues for visual scanning and problem solving during a basic calendar making task. However, suspect decreased visual scanning is due to cognitive and motor impairments involving decreased attention and ability to follow commands and less due to visual acuity. Pt also performed a basic self-care task (brushing teeth) with Max - Total A multimodal cues for problem solving. Pt demonstrated selective attention to tasks in a mildly distracting environment for ~30 minutes with Mod A verbal cues. Pt left upright in wheelchair with quick release belt on and all needs within reach. Continue with current plan of care.    Function:  Cognition Comprehension Comprehension assist level: Understands basic 25 - 49% of the time/ requires cueing 50 - 75% of the time  Expression   Expression assist level: Expresses basis less than 25% of the time/requires cueing >75% of the time.  Social Interaction Social Interaction assist level: Interacts appropriately  less than 25% of the time. May be withdrawn or combative.  Problem Solving Problem solving assist level: Solves basic less than 25% of the time - needs direction nearly all the time or does not effectively solve problems and may need a restraint for safety  Memory Memory assist level: Recognizes or recalls less than 25% of the time/requires cueing greater than 75% of the time    Pain Pain Assessment Pain Assessment: No/denies pain  Therapy/Group: Individual Therapy  Meredeth Ide  SLP - Student 12/13/2017, 9:42 AM

## 2017-12-13 NOTE — Progress Notes (Signed)
Physical Therapy Session Note  Patient Details  Name: Tammy Arnold MRN: [id number] Date of Birth: [birth year]  Today's Date: 12/13/2017 PT Individual Time: 8115-7262 PT Individual Time Calculation (min): 55 min   Short Term Goals: Week 1:  PT Short Term Goal 1 (Week 1): Pt will complete furniture transfer from couch with mod assist. PT Short Term Goal 2 (Week 1): Pt will ambulate 125 ft with mod assist with LRAD. PT Short Term Goal 3 (Week 1): Pt will negotiate 4 steps with 1 rail with mod assist.  Skilled Therapeutic Interventions/Progress Updates:  Pt received in w/c & agreeable to tx. Pt with behaviors demonstrating pain with movement & rest breaks provided. Pt unable to recall therapist's name from choice of 2 and when looking at photo album pt with difficulty holding book and unable to turn pages to R. Pt utilized dynavision while standing with min/mod assist. Pt requires cuing 100% of the time to turn head or look up/down with pt unable to follow commands. Pt frequently pressing blue square or unlit light instead of locating red light & pressing that. Gait training x 300 ft with R HHA and min/mod assist. Pt requires cuing for increased step length BLE & pt able to return demonstrate but does require additional cues as pt unable to maintain this gait pattern. Pt reports "I just feel blah" but unable to elaborate. In dayroom, pt engaged in cognitive task requiring her to identify individual playing cards. Pt able to state number of card correctly 75% of the time without assistance, but requires choice of two to correctly identify shape 50% of the time. At end of session pt left sitting in TIS w/c with QRB & chair alarm donned & daughter present. Pt required choice of 3 to correctly name daughter. Therapist educated daughter Cyril Mourning) on pt appearing to be limited more by impaired cognition than impaired vision.   Therapy Documentation Precautions:  Precautions Precautions: Fall Precaution  Comments: impaired vision Restrictions Weight Bearing Restrictions: No   See Function Navigator for Current Functional Status.   Therapy/Group: Individual Therapy  Waunita Schooner 12/13/2017, 12:26 PM

## 2017-12-13 NOTE — Progress Notes (Signed)
Occupational Therapy Session Note  Patient Details  Name: Tammy Arnold MRN: [id number] Date of Birth: 1941/06/29  Today's Date: 12/13/2017 OT Individual Time: 5956-3875 OT Individual Time Calculation (min): 70 min    Short Term Goals: Week 1:  OT Short Term Goal 1 (Week 1): Pt will scan to items to locate items needed for self care with mod multimodal cues.  OT Short Term Goal 2 (Week 1): Pt will perform toileting with max A to decrease level of assist with self care. OT Short Term Goal 3 (Week 1): Pt will perform toilet transfer with mod A for 3 consecutive sessions.  Skilled Therapeutic Interventions/Progress Updates:    Upon entering the room, pt supine in bed. Pt incontinent of bladder and saturated sheets. Pt rolling L <> R with max A and use of bed rails for hygiene and clothing management from bed level with total A for LB self care. Pt transferred from supine >sit with log roll and total A from therapist with pt resistive secondary to back pain with transition. OT notified RN for pain medication. Pt performed sit >stand with mod A and transferred into wheelchair with max stand pivot and max cuing with increased time for task. Breakfast tray placed in front of pt with all items cut up and opened. Pt feeding self with L hand and using fingers instead of utensils. Pt unable to utilize R UE for this task. Pt remained in wheelchair eating while MD and RN arrived to room as well as husband.   Therapy Documentation Precautions:  Precautions Precautions: Fall Precaution Comments: impaired vision Restrictions Weight Bearing Restrictions: No General:   Vital Signs: Therapy Vitals Temp: 98.1 F (36.7 C) Temp Source: Oral Pulse Rate: 80 Resp: 18 BP: 120/69 Patient Position (if appropriate): Sitting  See Function Navigator for Current Functional Status.   Therapy/Group: Individual Therapy  Gypsy Decant 12/13/2017, 4:48 PM

## 2017-12-13 NOTE — Progress Notes (Signed)
[place] PHYSICAL MEDICINE & REHABILITATION     PROGRESS NOTE    Subjective/Complaints: Discussed working with occupational therapy.  Back seems to be bothering her this morning.  Was able to sleep and had uneventful night per nurse  ROS: Limited due to cognitive/behavioral    Objective: Vital Signs: Blood pressure (!) 142/71, pulse 77, temperature 98 F (36.7 C), temperature source Oral, resp. rate 18, height 5\' 4"  (1.626 m), weight 56 kg (123 lb 7.3 oz), SpO2 100 %. Vas [place] Lower Extremity Venous (dvt)  Result Date: 12/09/2017  Lower Venous Study Indication: Swelling. Examination Guidelines: A complete evaluation includes B-mode imaging, spectral doppler, color doppler, and power doppler as needed of all accessible portions of each vessel. Bilateral testing is considered an integral part of a complete examination. Limited examinations for reoccurring indications may be performed as noted.  Right Venous Findings: +---------+---------------+---------+-----------+----------+-------+          CompressibilityPhasicitySpontaneityPropertiesSummary +---------+---------------+---------+-----------+----------+-------+ CFV      Full           Yes      Yes                          +---------+---------------+---------+-----------+----------+-------+ FV Prox  Full                                                 +---------+---------------+---------+-----------+----------+-------+ FV Mid   Full                                                 +---------+---------------+---------+-----------+----------+-------+ FV DistalFull                                                 +---------+---------------+---------+-----------+----------+-------+ PFV      Full                                                 +---------+---------------+---------+-----------+----------+-------+ POP      Full           Yes      Yes                           +---------+---------------+---------+-----------+----------+-------+ PTV      Full                                                 +---------+---------------+---------+-----------+----------+-------+ PERO     Full                                                 +---------+---------------+---------+-----------+----------+-------+ GSV      Full                                                 +---------+---------------+---------+-----------+----------+-------+  SSV      Full                                                 +---------+---------------+---------+-----------+----------+-------+  Left Venous Findings: +---------+---------------+---------+-----------+----------+-------+          CompressibilityPhasicitySpontaneityPropertiesSummary +---------+---------------+---------+-----------+----------+-------+ CFV      Full           Yes      Yes                          +---------+---------------+---------+-----------+----------+-------+ FV Prox  Full                                                 +---------+---------------+---------+-----------+----------+-------+ FV Mid   Full                                                 +---------+---------------+---------+-----------+----------+-------+ FV DistalFull                                                 +---------+---------------+---------+-----------+----------+-------+ PFV      Full                                                 +---------+---------------+---------+-----------+----------+-------+ POP      Full           Yes      Yes                          +---------+---------------+---------+-----------+----------+-------+ PTV      Full                                                 +---------+---------------+---------+-----------+----------+-------+ PERO     Full                                                  +---------+---------------+---------+-----------+----------+-------+ GSV      Full                                                 +---------+---------------+---------+-----------+----------+-------+ SSV      Full                                                 +---------+---------------+---------+-----------+----------+-------+  Final Interpretation: Right: There is no evidence of deep vein thrombosis in the lower extremity.There is no evidence of superficial venous thrombosis. No cystic structure found in the popliteal fossa. Left: There is no evidence of deep vein thrombosis in the lower extremity.There is no evidence of superficial venous thrombosis. No cystic structure found in the popliteal fossa.  *See table(s) above for measurements and observations. Electronically signed by Deitra Mayo on 12/09/2017 at 3:22:49 PM.     Recent Labs    12/13/17 0523  WBC 9.2  HGB 11.2*  HCT 35.5*  PLT 379   No results for input(s): NA, K, CL, GLUCOSE, BUN, CREATININE, CALCIUM in the last 72 hours.  Invalid input(s): CO CBG (last 3)  No results for input(s): GLUCAP in the last 72 hours.  Wt Readings from Last 3 Encounters:  12/07/17 56 kg (123 lb 7.3 oz)  12/04/17 56.7 kg (125 lb)  07/18/15 56.7 kg (125 lb)    Physical Exam:  Nursing note and vitals reviewed. Constitutional: She appears well-developed and well-nourished. No distress.  HENT:  Head: Normocephalic and atraumatic.  Eyes: Right eye exhibits no discharge. Left eye exhibits no discharge.    Neck: Normal range of motion. Neck supple.  Cardiovascular:RRR   Respiratory: normal effort GI: Soft. Bowel sounds are normal. She exhibits no distension.  Musculoskeletal:  No edema or tenderness in extremities  Neurological: Alert.  Can tell me where she was when given choices.  Remembered her husband's occupation. Skin: Skin is warm and dry. She is not diaphoretic.  Psychiatric: Pleasant and  cooperative    Assessment/Plan: 1.  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 Physiatrist is providing close team supervision and 24 hour management of active medical problems listed below. Physiatrist and rehab team continue to assess barriers to discharge/monitor patient progress toward functional and medical goals.  Function:  Bathing Bathing position   Position: Wheelchair/chair at sink  Bathing parts Body parts bathed by patient: (UB only) Body parts bathed by helper: Right arm, Left arm, Chest, Abdomen  Bathing assist Assist Level: (total A)      Upper Body Dressing/Undressing Upper body dressing   What is the patient wearing?: [place] over shirt/dress - Perfomed by helper: Thread/unthread right sleeve, Thread/unthread left sleeve, Put head through opening, Pull shirt over trunk        Upper body assist Assist Level: (total A)      Lower Body Dressing/Undressing Lower body dressing   What is the patient wearing?: Pants, Non-skid slipper socks       Pants- Performed by helper: Thread/unthread right pants leg, Thread/unthread left pants leg, Pull pants up/down   Non-skid slipper socks- Performed by helper: Don/doff right sock, Don/doff left sock                  Lower body assist Assist for lower body dressing: (total A)      Toileting Toileting Toileting activity did not occur: No continent bowel/bladder event   Toileting steps completed by helper: Adjust clothing prior to toileting, Performs perineal hygiene, Adjust clothing after toileting Toileting Assistive Devices: Grab bar or rail  Toileting assist Assist level: Two helpers   Transfers Chair/bed transfer Chair/bed transfer activity did not occur: Safety/medical concerns Chair/bed transfer method: Stand pivot Chair/bed transfer assist level: Maximal  assist (Pt 25 - 49%/lift and lower) Chair/bed transfer assistive  device: Armrests     Locomotion Ambulation     Max distance: 25 ft  Assist level: Moderate assist (Pt 50 - 74%)   Wheelchair Wheelchair activity did not occur: Safety/medical concerns     Assist Level: Dependent (Pt equals 0%)(for transport)  Cognition Comprehension Comprehension assist level: Understands basic less than 25% of the time/ requires cueing >75% of the time  Expression Expression assist level: Expresses basis less than 25% of the time/requires cueing >75% of the time.  Social Interaction Social Interaction assist level: Interacts appropriately less than 25% of the time. May be withdrawn or combative.  Problem Solving Problem solving assist level: Solves basic less than 25% of the time - needs direction nearly all the time or does not effectively solve problems and may need a restraint for safety  Memory Memory assist level: Recognizes or recalls less than 25% of the time/requires cueing greater than 75% of the time   Medical Problem List and Plan: 1.  Deficits with mobility, transfers, self-care, cognition secondary to Balint's syndrome.   -Continue therapies.     -Team conference today.  Reviewed potential discussion points with husband this morning    2.  DVT Prophylaxis/Anticoagulation: Pharmaceutical: Lovenox   -dopplers negative 3. Severe low back pain/Pain Management: tylenol prn   -kpad, Lidoderm patch   -Limited use of Robaxin 4. Mood: Team to provide ego support. LCSW to follow with patient and husband for support.   5. Neuropsych: This patient is not capable of making decisions on her own behalf.   -In low bed currently 6. Skin/Wound Care: routine pressure relief measures.  7. Fluids/Electrolytes/Nutrition: Monitor I/O. Offer supplements. Assist with meals due to visual deficits.   -Encourage intake and track ins and outs closely.   -Replace potassium as needed.  3.7 8. HTN: Monitor BP bid for  trends--may need agent for better control.   9. Fever: Now afebrile   - BC X 2 still pending. 2/13 UCS with multispecies, chest x-ray reviewed and is negative   -dopplers negative   -Continue to observe  LOS (Days) 6 A FACE TO FACE EVALUATION WAS PERFORMED  Meredith Staggers, MD 12/13/2017 8:46 AM

## 2017-12-13 NOTE — Plan of Care (Signed)
  RH BLADDER ELIMINATION RH STG MANAGE BLADDER WITH ASSISTANCE Description STG Manage Bladder With mod Assistance  12/13/2017 0847 - Progressing by Dietrich Pates, RN   RH SKIN INTEGRITY RH STG SKIN FREE OF INFECTION/BREAKDOWN Description Patients skin will remain free from further infection or breakdown with mod assist.  12/13/2017 0847 - Progressing by Dietrich Pates, RN   RH PAIN MANAGEMENT RH STG PAIN MANAGED AT OR BELOW PT'S PAIN GOAL Description Patients pain managed with pharmacological and nonpharmacological methods.  12/13/2017 0847 - Progressing by Dietrich Pates, RN

## 2017-12-14 ENCOUNTER — Inpatient Hospital Stay (HOSPITAL_COMMUNITY): Payer: Medicare Other | Admitting: Physical Therapy

## 2017-12-14 ENCOUNTER — Inpatient Hospital Stay (HOSPITAL_COMMUNITY): Payer: Medicare Other | Admitting: Occupational Therapy

## 2017-12-14 ENCOUNTER — Inpatient Hospital Stay (HOSPITAL_COMMUNITY): Payer: Medicare Other | Admitting: Speech Pathology

## 2017-12-14 ENCOUNTER — Inpatient Hospital Stay (HOSPITAL_COMMUNITY): Payer: Medicare Other | Admitting: *Deleted

## 2017-12-14 NOTE — Plan of Care (Signed)
  Progressing RH BOWEL ELIMINATION RH STG MANAGE BOWEL WITH ASSISTANCE Description STG Manage Bowel with mod Assistance.  12/14/2017 0257 - Progressing by Evelena Asa, RN RH STG MANAGE BOWEL W/MEDICATION W/ASSISTANCE Description STG Manage Bowel with Medication with mod Assistance.  12/14/2017 0257 - Progressing by Evelena Asa, RN RH BLADDER ELIMINATION RH STG MANAGE BLADDER WITH ASSISTANCE Description STG Manage Bladder With mod Assistance  12/14/2017 0257 - Progressing by Evelena Asa, RN RH SKIN INTEGRITY RH STG SKIN FREE OF INFECTION/BREAKDOWN Description Patients skin will remain free from further infection or breakdown with mod assist.  12/14/2017 0257 - Progressing by Evelena Asa, RN RH STG MAINTAIN SKIN INTEGRITY WITH ASSISTANCE Description STG Maintain Skin Integrity With mod Assistance.  12/14/2017 0257 - Progressing by Evelena Asa, RN RH SAFETY RH STG ADHERE TO SAFETY PRECAUTIONS W/ASSISTANCE/DEVICE Description STG Adhere to Safety Precautions With max Assistance/Device.  12/14/2017 0257 - Progressing by Evelena Asa, RN RH COGNITION-NURSING RH STG USES MEMORY AIDS/STRATEGIES W/ASSIST TO PROBLEM SOLVE Description STG Uses Memory Aids/Strategies With max Assistance to Problem Solve.  12/14/2017 0257 - Progressing by Evelena Asa, RN RH PAIN MANAGEMENT RH STG PAIN MANAGED AT OR BELOW PT'S PAIN GOAL Description Patients pain managed with pharmacological and nonpharmacological methods.  12/14/2017 0257 - Progressing by Evelena Asa, RN

## 2017-12-14 NOTE — Progress Notes (Addendum)
Physical Therapy Session Note  Patient Details  Name: Tammy Arnold MRN: [id number] Date of Birth: [birth year]  Today's Date: 12/14/2017 PT Individual Time: 1505-1600 PT Individual Time Calculation (min): 55 min   Short Term Goals: Week 1:  PT Short Term Goal 1 (Week 1): Pt will complete furniture transfer from couch with mod assist. PT Short Term Goal 2 (Week 1): Pt will ambulate 125 ft with mod assist with LRAD. PT Short Term Goal 3 (Week 1): Pt will negotiate 4 steps with 1 rail with mod assist.  Skilled Therapeutic Interventions/Progress Updates:  Pt received in w/c & agreeable to tx. Pt with behaviors demonstrating pain with sit>stand transfers. Pt reported need to use restroom and ambulated within room & bathroom with min assist HHA but max directional cuing. Pt able to manage pants & brief with mod assist. Pt with incontinent void in brief followed by continent void on toilet. Pt able to perform peri hygiene with assist to obtain toilet paper. From w/c level pt performed hand hygiene with max cuing to locate faucet, soap and paper towels. Transported pt to gym via w/c total assist. Throughout session pt completed sit>stand transfers with mod assist from w/c but when transferring from low, compliant couch pt requires +2 max assist 2/2 pt's inability to shift pelvis anteriorly to achieve upright posture; pt reporting being scared, potentially due to impaired proprioception. Pt ambulates gym<>apartment with min assist HHA of 1 then HHA of 2 for return trip. During initial ambulation trial attempted to have pt locate items in hallway but pt unable to follow one step commands to turn head/look L and R in order to locate items. Pt unable to recall correct month from choice of two, but able to state correct location and year from choice of 2. At end of session pt left sitting in TIS w/c with QRB & chair alarm donned, telesitter in room, and calmly listening to music.  Addendum: Pt's urine cloudy  with foul odor & RN made aware.  Therapy Documentation Precautions:  Precautions Precautions: Fall Precaution Comments: impaired vision Restrictions Weight Bearing Restrictions: No   See Function Navigator for Current Functional Status.   Therapy/Group: Individual Therapy  Waunita Schooner 12/14/2017, 4:27 PM

## 2017-12-14 NOTE — Patient Care Conference (Signed)
Inpatient RehabilitationTeam Conference and Plan of Care Update Date: 12/13/2017   Time: 2:45 PM    Patient Name: Tammy Arnold      Medical Record Number: [id number]  Date of Birth: 1941/04/21 Sex: Female         Room/Bed: 4W08C/4W08C-01 Payor Info: Payor: MEDICARE / Plan: MEDICARE PART A AND B / Product Type: *No Product type* /    Admitting Diagnosis: Balance Syn  Admit Date/Time:  12/07/2017  5:22 PM Admission Comments: No comment available   Primary Diagnosis:  Balint's syndrome Principal Problem: Balint's syndrome  Patient Active Problem List   Diagnosis Date Noted  . 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 FUO (fever of unknown origin)   . Ataxia 12/05/2017  . Cognitive impairment   . Abnormal MRI   . Blurry vision   . Hypokalemia   . Acute blood loss anemia   . TIA (transient ischemic attack) 12/04/2017  . Essential hypertension 12/04/2017  . HLD (hyperlipidemia) 12/04/2017    Expected Discharge Date: Expected Discharge Date: (1-2 weeks)  Team Members Present: Physician leading conference: Dr. Alger Simons Social Worker Present: Lennart Pall, LCSW Nurse Present: Other (comment)(Christal, RN) PT Present: Lavone Nian, PT OT Present: Benay Pillow, OT SLP Present: Weston Anna, SLP PPS Coordinator present : Daiva Nakayama, RN, CRRN     Current Status/Progress Goal Weekly Team Focus  Medical   admitted with Balint's syndrome which may be attributed to anoxic injury.  Patient with baseline dementia and decline prior to admission  Improve balance and coordination  Cognitive and behavioral management.  Optimize nutrition.   Bowel/Bladder   Incontinent of B/B-physically aggressive with care  Regain continence  Frequent toileting   Swallow/Nutrition/ Hydration             ADL's   Max-Total A bathing at shower level, Total A UB/LB dressing, Mod A shower transfers. Severely apraxic and cognitively impaired    Min A-supervision overall   pt/family education, ADL  retraining, cognitive remediation, pain mgt, functional transfer    Mobility   min/mod assist for sit>stand and ambulation, ambulates HHA, uses rails for stair negotiation, requires max/total assist for bed mobility, poor proprioception and spatial awareness, fearful of movement  min assist overall  pt/family education, gait, transfers, activity tolerance   Communication             Safety/Cognition/ Behavioral Observations  Max-Total A  Min A  attention, problem solving, recall with use of strategies    Pain   Denies pain  Pain < 2  Assess Qshift and PRN   Skin   Scattered generalized bruising   Prevent skin breakdown and infection  Assess Qshift and PRN    Rehab Goals Patient on target to meet rehab goals: Yes *See Care Plan and progress notes for long and short-term goals.     Barriers to Discharge  Current Status/Progress Possible Resolutions Date Resolved   Physician    Behavior        Full-time supervision at discharge to account for ongoing cognitive deficits.      Nursing                  PT  Lack of/limited family support;Home environment access/layout;Behavior;Other (comments)  unsure of husband's physical ability to care for pt, pt has no rails to enter home and would benefit from UE support for stair negotiation, impaired vision & cognition limits progress              OT Incontinence;Behavior  Anxiousness/pain. Unsure if pts spouse can provide the ADL care that she will need             SLP                SW                Discharge Planning/Teaching Needs:  Plan to return home with spouse who will need to arrange for 24/7 assistance - will follow up with him again on this.  Teaching to be completed with spouse and any other caregivers.   Team Discussion:  Pt with significant deficits in memory, motor and occular function.  ST has attempted to begin some ed with spouse.  Pt can range in function from min assist to +2 total assist dependent on her attention/  pain/ and awareness of her own body in space.  Now out of enclosure bed.  incont of b/b.  Concern of team that progress may be minimal and family receptivity to recommendations of care may be limited.  Hope to see some progress over this next week.  Team does not feel they can yet set a target d/c date.  Plan 1-2 weeks.  Revisions to Treatment Plan:  None    Continued Need for Acute Rehabilitation Level of Care: The patient requires daily medical management by a physician with specialized training in physical medicine and rehabilitation for the following conditions: Daily direction of a multidisciplinary physical rehabilitation program to ensure safe treatment while eliciting the highest outcome that is of practical value to the patient.: Yes Daily medical management of patient stability for increased activity during participation in an intensive rehabilitation regime.: Yes Daily analysis of laboratory values and/or radiology reports with any subsequent need for medication adjustment of medical intervention for : Neurological problems;Mood/behavior problems  Tammy Arnold 12/14/2017, 9:28 AM

## 2017-12-14 NOTE — Progress Notes (Signed)
Occupational Therapy Session Note  Patient Details  Name: Tammy Arnold MRN: [id number] Date of Birth: Mar 02, 1941  Today's Date: 12/14/2017 OT Individual Time: 1300-1413 OT Individual Time Calculation (min): 73 min    Short Term Goals: Week 1:  OT Short Term Goal 1 (Week 1): Pt will scan to items to locate items needed for self care with mod multimodal cues.  OT Short Term Goal 2 (Week 1): Pt will perform toileting with max A to decrease level of assist with self care. OT Short Term Goal 3 (Week 1): Pt will perform toilet transfer with mod A for 3 consecutive sessions.  Skilled Therapeutic Interventions/Progress Updates:    Upon entering the room, NT present and assisting pt with lunch tray. Pt appearing to be very frustrated, confused, and becoming agitated. Pt pushing and showing at tray table and yelling questions at therapist regarding her location and current situation. OT removed pt from from and propelled pt from room via wheelchair and total A to attempt to calm pt. OT taking pt to quiet room and orienting pt to location, situation, and time. Pt very confused and continues to be after max cuing for orientation but is calmer. OT returned pt to room via wheelchair and reviewed picture books of her grandchildren. Pt able to match picture to correct name with 50% accuracy. OT playing pt's favorite music to continue to calm pt. Pt singling when therapist exiting the room. Pt tilted in wheelchair, quick release belt donned, and chair alarm activated.   Therapy Documentation Precautions:  Precautions Precautions: Fall Precaution Comments: impaired vision Restrictions Weight Bearing Restrictions: No General:   Vital Signs: Therapy Vitals Temp: 98.7 F (37.1 C) Temp Source: Axillary Pulse Rate: 80 Resp: 20 BP: (!) 128/54 Patient Position (if appropriate): Sitting  See Function Navigator for Current Functional Status.   Therapy/Group: Individual Therapy  Gypsy Decant 12/14/2017, 4:41 PM

## 2017-12-14 NOTE — Progress Notes (Signed)
Social Work Patient ID: Corky Sox, female   DOB: 03-01-1941, 77 y.o.   MRN: [id number]     Rexene Alberts  Social Worker  General Practice  Patient Care Conference  Signed  Date of Service:  12/14/2017  9:28 AM          Signed          [] Hide copied text  [] Hover for details   Inpatient RehabilitationTeam Conference and Plan of Care Update Date: 12/13/2017   Time: 2:45 PM      Patient Name: Tammy Arnold      Medical Record Number: [id number]  Date of Birth: 10-Aug-1941 Sex: Female         Room/Bed: 4W08C/4W08C-01 Payor Info: Payor: MEDICARE / Plan: MEDICARE PART A AND B / Product Type: *No Product type* /     Admitting Diagnosis: Balance Syn  Admit Date/Time:  12/07/2017  5:22 PM Admission Comments: No comment available    Primary Diagnosis:  Balint's syndrome Principal Problem: Balint's syndrome       Patient Active Problem List    Diagnosis Date Noted  . 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 FUO (fever of unknown origin)    . Ataxia 12/05/2017  . Cognitive impairment    . Abnormal MRI    . Blurry vision    . Hypokalemia    . Acute blood loss anemia    . TIA (transient ischemic attack) 12/04/2017  . Essential hypertension 12/04/2017  . HLD (hyperlipidemia) 12/04/2017      Expected Discharge Date: Expected Discharge Date: (1-2 weeks)   Team Members Present: Physician leading conference: Dr. 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  Patient with baseline dementia and decline prior to admission  Improve balance and coordination  Cognitive and behavioral management.  Optimize nutrition.   Bowel/Bladder    Incontinent of B/B-physically aggressive with care  Regain continence  Frequent toileting   Swallow/Nutrition/ Hydration               ADL's     Max-Total A bathing at shower level, Total A UB/LB dressing, Mod A shower transfers. Severely apraxic and cognitively impaired    Min A-supervision overall   pt/family education, ADL retraining, cognitive remediation, pain mgt, functional transfer    Mobility     min/mod assist for sit>stand and ambulation, ambulates HHA, uses rails for stair negotiation, requires max/total assist for bed mobility, poor proprioception and spatial awareness, fearful of movement  min assist overall  pt/family education, gait, transfers, activity tolerance   Communication               Safety/Cognition/ Behavioral Observations   Max-Total A  Min A  attention, problem solving, recall with use of strategies    Pain     Denies pain  Pain < 2  Assess Qshift and PRN   Skin     Scattered generalized bruising   Prevent skin breakdown and infection  Assess Qshift and PRN     Rehab Goals Patient on target to meet rehab goals: Yes *See Care Plan and progress notes for long and short-term goals.  Barriers to Discharge   Current Status/Progress Possible Resolutions Date Resolved   Physician     Behavior        Full-time supervision at discharge to account for ongoing cognitive deficits.      Nursing                 PT  Lack of/limited family support;Home environment access/layout;Behavior;Other (comments)  unsure of husband's physical ability to care for pt, pt has no rails to enter home and would benefit from UE support for stair negotiation, impaired vision & cognition limits progress              OT Incontinence;Behavior  Anxiousness/pain. Unsure if pts spouse can provide the ADL care that she will need             SLP            SW              Discharge Planning/Teaching Needs:  Plan to return home with spouse who will need to arrange for 24/7 assistance  - will follow up with him again on this.  Teaching to be completed with spouse and any other caregivers.   Team Discussion:  Pt with significant deficits in memory, motor and occular function.  ST has attempted to begin some ed with spouse.  Pt can range in function from min assist to +2 total assist dependent on her attention/ pain/ and awareness of her own body in space.  Now out of enclosure bed.  incont of b/b.  Concern of team that progress may be minimal and family receptivity to recommendations of care may be limited.  Hope to see some progress over this next week.  Team does not feel they can yet set a target d/c date.  Plan 1-2 weeks.  Revisions to Treatment Plan:  None    Continued Need for Acute Rehabilitation Level of Care: The patient requires daily medical management by a physician with specialized training in physical medicine and rehabilitation for the following conditions: Daily direction of a multidisciplinary physical rehabilitation program to ensure safe treatment while eliciting the highest outcome that is of practical value to the patient.: Yes Daily medical management of patient stability for increased activity during participation in an intensive rehabilitation regime.: Yes Daily analysis of laboratory values and/or radiology reports with any subsequent need for medication adjustment of medical intervention for : Neurological problems;Mood/behavior problems   Janet Decesare 12/14/2017, 9:28 AM

## 2017-12-14 NOTE — Progress Notes (Signed)
[facility]     PROGRESS NOTE    Subjective/Complaints: No new issues. Headache this morning.   ROS: Limited due to cognitive/behavioral    Objective: Vital Signs: Blood pressure (!) 160/68, pulse 79, temperature 98.8 F (37.1 C), temperature source Oral, resp. rate 20, height 5\' 4"  (1.626 m), weight 56 kg (123 lb 7.3 oz), SpO2 100 %. Vas [place] Lower Extremity Venous (dvt)  Result Date: 12/09/2017  Lower Venous Study Indication: Swelling. Examination Guidelines: A complete evaluation includes B-mode imaging, spectral doppler, color doppler, and power doppler as needed of all accessible portions of each vessel. Bilateral testing is considered an integral part of a complete examination. Limited examinations for reoccurring indications may be performed as noted.  Right Venous Findings: +---------+---------------+---------+-----------+----------+-------+          CompressibilityPhasicitySpontaneityPropertiesSummary +---------+---------------+---------+-----------+----------+-------+ CFV      Full           Yes      Yes                          +---------+---------------+---------+-----------+----------+-------+ FV Prox  Full                                                 +---------+---------------+---------+-----------+----------+-------+ FV Mid   Full                                                 +---------+---------------+---------+-----------+----------+-------+ FV DistalFull                                                 +---------+---------------+---------+-----------+----------+-------+ PFV      Full                                                 +---------+---------------+---------+-----------+----------+-------+ POP      Full           Yes      Yes                          +---------+---------------+---------+-----------+----------+-------+ PTV      Full                                                  +---------+---------------+---------+-----------+----------+-------+ PERO     Full                                                 +---------+---------------+---------+-----------+----------+-------+ GSV      Full                                                 +---------+---------------+---------+-----------+----------+-------+  SSV      Full                                                 +---------+---------------+---------+-----------+----------+-------+  Left Venous Findings: +---------+---------------+---------+-----------+----------+-------+          CompressibilityPhasicitySpontaneityPropertiesSummary +---------+---------------+---------+-----------+----------+-------+ CFV      Full           Yes      Yes                          +---------+---------------+---------+-----------+----------+-------+ FV Prox  Full                                                 +---------+---------------+---------+-----------+----------+-------+ FV Mid   Full                                                 +---------+---------------+---------+-----------+----------+-------+ FV DistalFull                                                 +---------+---------------+---------+-----------+----------+-------+ PFV      Full                                                 +---------+---------------+---------+-----------+----------+-------+ POP      Full           Yes      Yes                          +---------+---------------+---------+-----------+----------+-------+ PTV      Full                                                 +---------+---------------+---------+-----------+----------+-------+ PERO     Full                                                 +---------+---------------+---------+-----------+----------+-------+ GSV      Full                                                  +---------+---------------+---------+-----------+----------+-------+ SSV      Full                                                 +---------+---------------+---------+-----------+----------+-------+  Final Interpretation: Right: There is no evidence of deep vein thrombosis in the lower extremity.There is no evidence of superficial venous thrombosis. No cystic structure found in the popliteal fossa. Left: There is no evidence of deep vein thrombosis in the lower extremity.There is no evidence of superficial venous thrombosis. No cystic structure found in the popliteal fossa.  *See table(s) above for measurements and observations. Electronically signed by Deitra Mayo on 12/09/2017 at 3:22:49 PM.     Recent Labs    12/13/17 0523  WBC 9.2  HGB 11.2*  HCT 35.5*  PLT 379   No results for input(s): NA, K, CL, GLUCOSE, BUN, CREATININE, CALCIUM in the last 72 hours.  Invalid input(s): CO CBG (last 3)  No results for input(s): GLUCAP in the last 72 hours.  Wt Readings from Last 3 Encounters:  12/07/17 56 kg (123 lb 7.3 oz)  12/04/17 56.7 kg (125 lb)  07/18/15 56.7 kg (125 lb)    Physical Exam:  Nursing note and vitals reviewed. Constitutional: She appears well-developed and well-nourished. No distress.  HENT:  Head: Normocephalic and atraumatic.  Eyes: Right eye exhibits no discharge. Left eye exhibits no discharge.    Neck: Normal range of motion. Neck supple.  Cardiovascular: rrr   Respiratory: ctab GI: Soft. Bowel sounds are normal. She exhibits no distension.  Musculoskeletal:  No edema or tenderness in extremities  Neurological: memory deficits. Skin: Skin is warm and dry. She is not diaphoretic.  Psychiatric: Pleasant and cooperative    Assessment/Plan: 1.  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 Physiatrist is providing close team  supervision and 24 hour management of active medical problems listed below. Physiatrist and rehab team continue to assess barriers to discharge/monitor patient progress toward functional and medical goals.  Function:  Bathing Bathing position   Position: Wheelchair/chair at sink  Bathing parts Body parts bathed by patient: (UB only) Body parts bathed by helper: Right arm, Left arm, Chest, Abdomen  Bathing assist Assist Level: (total A)      Upper Body Dressing/Undressing Upper body dressing   What is the patient wearing?: Pull over shirt/dress       Pull over shirt/dress - Perfomed by helper: Thread/unthread right sleeve, Thread/unthread left sleeve, Put head through opening, Pull shirt over trunk        Upper body assist Assist Level: (total A)      Lower Body Dressing/Undressing Lower body dressing   What is the patient wearing?: Pants       Pants- Performed by helper: Thread/unthread right pants leg, Thread/unthread left pants leg, Pull pants up/down   Non-skid slipper socks- Performed by helper: Don/doff right sock, Don/doff left sock                  Lower body assist Assist for lower body dressing: (dependent)      Toileting Toileting Toileting activity did not occur: No continent bowel/bladder event   Toileting steps completed by helper: Adjust clothing prior to toileting, Performs perineal hygiene, Adjust clothing after toileting Toileting Assistive Devices: Grab bar or rail  Toileting assist Assist level: Two helpers   Transfers Chair/bed transfer Chair/bed transfer activity did not occur: Safety/medical concerns Chair/bed transfer method: Stand pivot Chair/bed transfer assist level: Maximal assist (Pt 25 - 49%/lift and lower) Chair/bed transfer assistive device: Armrests     Locomotion Ambulation     Max distance: 300 ft Assist level:  Touching or steadying assistance (Pt > 75%)   Wheelchair Wheelchair activity did not occur: Safety/medical  concerns     Assist Level: Dependent (Pt equals 0%)(for transport)  Cognition Comprehension Comprehension assist level: Understands basic 25 - 49% of the time/ requires cueing 50 - 75% of the time  Expression Expression assist level: Expresses basis less than 25% of the time/requires cueing >75% of the time.  Social Interaction Social Interaction assist level: Interacts appropriately less than 25% of the time. May be withdrawn or combative.  Problem Solving Problem solving assist level: Solves basic less than 25% of the time - needs direction nearly all the time or does not effectively solve problems and may need a restraint for safety  Memory Memory assist level: Recognizes or recalls less than 25% of the time/requires cueing greater than 75% of the time   Medical Problem List and Plan: 1.  Deficits with mobility, transfers, self-care, cognition secondary to Balint's syndrome.   -Continue therapies.     -continue work on carry over/cognitive  2.  DVT Prophylaxis/Anticoagulation: Pharmaceutical: Lovenox   -dopplers negative 3. Severe low back pain/Pain Management: tylenol prn   -kpad, Lidoderm patch   -Limited use of Robaxin 4. Mood: Team to provide ego support. LCSW to follow with patient and husband for support.   5. Neuropsych: This patient is not capable of making decisions on her own behalf.   -In low bed currently 6. Skin/Wound Care: routine pressure relief measures.  7. Fluids/Electrolytes/Nutrition: Monitor I/O. Offer supplements. Assist with meals due to visual deficits.   -Encourage intake and track ins and outs closely.   -Replace potassium as needed.  3.7 8. HTN: Monitor BP bid for trends--may need agent for better control.   9. Fever: Now afebrile   - BC X 2 neg. 2/13 UCS with multispecies, chest x-ray reviewed and is negative   -dopplers negative    ve  LOS (Days) 7 A FACE TO FACE EVALUATION WAS PERFORMED  Meredith Staggers, MD 12/14/2017 7:59 AM

## 2017-12-14 NOTE — Progress Notes (Signed)
Recreational Therapy Session Note  Patient Details  Name: Tammy Arnold MRN: [id number] Date of Birth: 1941-02-13 Today's Date: 12/14/2017  TR eval deferred, pt not appropriate for TR services at this time.  Will continue to monitor through team.  Emonnie Cannady 12/14/2017, 9:34 AM

## 2017-12-14 NOTE — Progress Notes (Signed)
Speech Language Pathology Daily Session Note  Patient Details  Name: Tammy Arnold MRN: [id number] Date of Birth: 02-19-1941  Today's Date: 12/14/2017 SLP Individual Time: 0830-0930 SLP Individual Time Calculation (min): 60 min  Short Term Goals: Week 1: SLP Short Term Goal 1 (Week 1): Patient will self-monitor and correct errors during functional tasks with Max A multimodal cues.  SLP Short Term Goal 2 (Week 1): Patient will visually scan to locate specific items during functional tasks with Max A multimodal cues.  SLP Short Term Goal 4 (Week 1): Patient will demonstrate functional problem solving with functional and familair tasks with Max A multimodal cues.  SLP Short Term Goal 5 (Week 1): Patient will demonstrate selective attention to functional tasks for ~30 minutes with Min A verbal cues for redirection.   Skilled Therapeutic Interventions: Skilled treatment session focused on cognitive goals. Upon arrival, patient was awake while supine in bed. SLP facilitated session by providing Max-Total A verbal and tactile cues for patient to sit EOB and transfer to the wheelchair. Although patient required Max cueing, patient was less resistive without reports of pain this session. However, patient's apraxia and impaired proprioception continues to severely impact patient's function, especially with transitional movement. Patient also required total A for tray set-up and Mod A verbal cues with extra time for problem solving during self-feeding.  Patient also participated in a visual scanning task. Patient decoded at the word level with 100% accuracy and matched written word to object from a field of 4 with 30% accuracy due to requiring Max A verbal cues for attention throughout task. Patient left upright in wheelchair with chair alarm on, quick release belt in place and all needs within reach. Continue with current plan of care.      Function:  Eating Eating   Modified Consistency Diet:  No Eating Assist Level: More than reasonable amount of time;Set up assist for;Supervision or verbal cues;Help managing cup/glass   Eating Set Up Assist For: Opening containers;Cutting food       Cognition Comprehension Comprehension assist level: Understands basic 50 - 74% of the time/ requires cueing 25 - 49% of the time  Expression   Expression assist level: Expresses basic 75 - 89% of the time/requires cueing 10 - 24% of the time. Needs helper to occlude trach/needs to repeat words.  Social Interaction Social Interaction assist level: Interacts appropriately 25 - 49% of time - Needs frequent redirection.  Problem Solving Problem solving assist level: Solves basic less than 25% of the time - needs direction nearly all the time or does not effectively solve problems and may need a restraint for safety  Memory Memory assist level: Recognizes or recalls less than 25% of the time/requires cueing greater than 75% of the time    Pain No reports of pain   Therapy/Group: Individual Therapy  Venda Dice 12/14/2017, 9:52 AM

## 2017-12-15 ENCOUNTER — Inpatient Hospital Stay (HOSPITAL_COMMUNITY): Payer: Medicare Other | Admitting: Occupational Therapy

## 2017-12-15 ENCOUNTER — Inpatient Hospital Stay (HOSPITAL_COMMUNITY): Payer: Medicare Other | Admitting: Speech Pathology

## 2017-12-15 ENCOUNTER — Inpatient Hospital Stay (HOSPITAL_COMMUNITY): Payer: Medicare Other | Admitting: Physical Therapy

## 2017-12-15 LAB — BASIC METABOLIC PANEL
ANION GAP: 11 (ref 5–15)
BUN: 14 mg/dL (ref 6–20)
CALCIUM: 8.9 mg/dL (ref 8.9–10.3)
CO2: 24 mmol/L (ref 22–32)
Chloride: 106 mmol/L (ref 101–111)
Creatinine, Ser: 0.66 mg/dL (ref 0.44–1.00)
GFR calc Af Amer: >60 mL/min (ref >60)
GFR calc non Af Amer: >60 mL/min (ref >60)
Glucose, Bld: 95 mg/dL (ref 65–99)
Potassium: 4 mmol/L (ref 3.5–5.1)
Sodium: 141 mmol/L (ref 135–145)

## 2017-12-15 NOTE — Progress Notes (Signed)
Speech Language Pathology Weekly Progress and Session Note  Patient Details  Name: Tammy Arnold MRN: [id number] Date of Birth: Aug 23, 1941  Beginning of progress report period: December 08, 2017 End of progress report period: December 15, 2017  Today's Date: 12/15/2017 SLP Individual Time: 1030-1130 SLP Individual Time Calculation (min): 60 min  Short Term Goals: Week 1: SLP Short Term Goal 1 (Week 1): Patient will self-monitor and correct errors during functional tasks with Max A multimodal cues.  SLP Short Term Goal 1 - Progress (Week 1): Not met SLP Short Term Goal 2 (Week 1): Patient will visually scan to locate specific items during functional tasks with Max A multimodal cues.  SLP Short Term Goal 2 - Progress (Week 1): Met SLP Short Term Goal 4 (Week 1): Patient will demonstrate functional problem solving with functional and familair tasks with Max A multimodal cues.  SLP Short Term Goal 4 - Progress (Week 1): Not met SLP Short Term Goal 5 (Week 1): Patient will demonstrate selective attention to functional tasks for ~30 minutes with Min A verbal cues for redirection.  SLP Short Term Goal 5 - Progress (Week 1): Not met    New Short Term Goals: Week 2: SLP Short Term Goal 1 (Week 2): Patient will self-monitor and correct errors during functional tasks with Max A multimodal cues.  SLP Short Term Goal 2 (Week 2): Patient will visually scan to locate specific items during functional tasks with Mod A multimodal cues.  SLP Short Term Goal 3 (Week 2): Patient will demonstrate functional problem solving with functional and familair tasks with Max A multimodal cues.  SLP Short Term Goal 4 (Week 2): Patient will demonstrate selective attention to functional tasks for ~10 minutes with Min A verbal cues for redirection.  SLP Short Term Goal 5 (Week 2): Patient will utilize exernal aids for orientation to place and time with Max A multimodal cues.   Weekly Progress Updates: Patient had made  inconsistent gains and has met 1 of 4 STG's this reporting period. Currently, patient requires overall Max-Total A to complete functional and familiar tasks safely in regards to problem solving, recall with use of aids, awareness and attention. Function is also impacted by visual impairments, however, hard to assess 2/2 cognitive deficits. Patient and family education is ongoing. Patient would benefit from continued skilled SLP intervention to maximize her cognitive function and overall functional independence prior to discharge.      Intensity: Minumum of 1-2 x/day, 30 to 90 minutes Frequency: 3 to 5 out of 7 days Duration/Length of Stay: 3 weeks Treatment/Interventions: Cognitive remediation/compensation;Environmental controls;Internal/external aids;Therapeutic Activities;Patient/family education;Functional tasks;Cueing hierarchy   Daily Session  Skilled Therapeutic Interventions: Skilled treatment session focused on cognitive goals. SLP facilitated session by providing Total A for orientation to place and situation as well as for utilization of external memory aids. Patient also required Max-Total A verbal, tactile and visual cues for visual scanning and attention during a basic problem solving and recall task that focused on making a family tree of her children and grandchildren while utilizing large, bright sticky notes with her family's name written on them. [name] daughter present and educated on current impairments and goals. She verbalized understanding. Patient left upright in wheelchair with daughter present. Continue with current plan of care.        Function:   Cognition Comprehension Comprehension assist level: Understands basic 50 - 74% of the time/ requires cueing 25 - 49% of the time  Expression   Expression assist level:  Expresses basic 50 - 74% of the time/requires cueing 25 - 49% of the time. Needs to repeat parts of sentences.  Social Interaction Social Interaction assist  level: Interacts appropriately 25 - 49% of time - Needs frequent redirection.  Problem Solving Problem solving assist level: Solves basic less than 25% of the time - needs direction nearly all the time or does not effectively solve problems and may need a restraint for safety  Memory Memory assist level: Recognizes or recalls less than 25% of the time/requires cueing greater than 75% of the time   Pain Pain Assessment Faces Pain Scale: Hurts a little bit  Therapy/Group: Individual Therapy  Mally Gavina 12/15/2017, 12:44 PM

## 2017-12-15 NOTE — Progress Notes (Signed)
Occupational Therapy Session Note  Patient Details  Name: AMBRIANA SELWAY MRN: [id number] Date of Birth: 1941-08-28  Today's Date: 12/15/2017 OT Individual Time: 8850-2774 OT Individual Time Calculation (min): 69 min    Short Term Goals: Week 1:  OT Short Term Goal 1 (Week 1): Pt will scan to items to locate items needed for self care with mod multimodal cues.  OT Short Term Goal 2 (Week 1): Pt will perform toileting with max A to decrease level of assist with self care. OT Short Term Goal 3 (Week 1): Pt will perform toilet transfer with mod A for 3 consecutive sessions.  Skilled Therapeutic Interventions/Progress Updates:    Upon entering the room, pt awake and supine in bed. Pt required maximal assist to orient to situation, location, and time. Pt very confused but remaining calm with increased time and further explanation. Pt rolling L <> R with mod A and pt following commands for technique and initiation. Pt's pants donned with total A from bed level. Supine >sit with total A to EOB with pt reporting increased lower back pain and RN notified. Pt standing with max A and quickly sitting down secondary to pain. Mod A squat pivot transfer to the R with pt's hands in lap. Pt remained in wheelchair with quick release belt donned and tilted for safety.   Therapy Documentation Precautions:  Precautions Precautions: Fall Precaution Comments: impaired vision Restrictions Weight Bearing Restrictions: No General:   Vital Signs:  Pain: Pain Assessment Pain Assessment: Faces Faces Pain Scale: Hurts even more Pain Type: Acute pain Pain Location: Generalized Pain Orientation: Right;Left;Posterior;Anterior Pain Intervention(s): Medication (See eMAR);Repositioned  See Function Navigator for Current Functional Status.   Therapy/Group: Individual Therapy  Gypsy Decant 12/15/2017, 9:34 AM

## 2017-12-15 NOTE — Progress Notes (Signed)
Physical Therapy Weekly Progress Note  Patient Details  Name: Tammy Arnold MRN: [id number] Date of Birth: June 17, 1941  Beginning of progress report period: December 08, 2017 End of progress report period: December 15, 2017  Today's Date: 12/15/2017 PT Individual Time: 0865-7846 and 1336-1400 PT Individual Time Calculation (min): 60 min and 24 min  Patient has met 2 of 3 short term goals.  Pt is making inconsistent progress towards all goals. Pt has been able to ambulate up to 400 ft with min assist HHA but can require up to +2 assist for sit>stand transfers (from low couch) or +1 max assist for bed>w/c. Pt is limited by impaired vision but more so by impaired cognition and significantly impaired bodily proprioception. Pt continues to have c/o back pain and appears to be fearful of movements as a result. Education is occurring with pt's family members when they are present for sessions and it is anticipated that pt will require physical assistance upon d/c & husband has been made aware.   Patient continues to demonstrate the following deficits muscle weakness, decreased cardiorespiratoy endurance, motor apraxia, decreased coordination and decreased motor planning, decreased visual acuity and decreased visual perceptual skills, decreased attention to left, decreased attention to right and decreased motor planning, decreased initiation, decreased attention, decreased awareness, decreased problem solving, decreased safety awareness, decreased memory and delayed processing, and decreased standing balance, decreased postural control and decreased balance strategies and therefore will continue to benefit from skilled PT intervention to increase functional independence with mobility.  Patient is making inconsistent progress towards LTG's.  Continue plan of care.  PT Short Term Goals Week 1:  PT Short Term Goal 1 (Week 1): Pt will complete furniture transfer from couch with mod assist. PT Short Term Goal  1 - Progress (Week 1): Not met PT Short Term Goal 2 (Week 1): Pt will ambulate 125 ft with mod assist with LRAD. PT Short Term Goal 2 - Progress (Week 1): Met PT Short Term Goal 3 (Week 1): Pt will negotiate 4 steps with 1 rail with mod assist. PT Short Term Goal 3 - Progress (Week 1): Met Week 2:  PT Short Term Goal 1 (Week 2): Pt will complete furniture transfer from couch with mod assist +1. PT Short Term Goal 2 (Week 2): Pt will negotiate curb without rails but physical assist to simulate home environment.  Skilled Therapeutic Interventions/Progress Updates:  Treatment 1: Pt received in TIS w/c with husband present in room. Pt's husband Gershon Mussel) voicing concerns and understanding of pt's limited progress. Pt reports he could provide assistance to pt upon d/c if she could consistently perform at a min assist level but is aware that her progress is minimal and inconsistent. Pt's husband also voices frustration over pt not having a definite diagnosis but firmly believes pt had an acute event leading to hospital admission. Husband also voiced concern over pt losing weight and at therapist's suggestion he selected menu items that pt traditionally eats; RN made aware of husband's concerns & reports she will ask for a nutritional consult. Tom aware that pt may have to d/c to SNF if progress is not made in this setting. Therapist educated Tom on pt's inconsistent performance during sessions. Transported pt to gym via w/c total assist where pt then negotiated 12 steps with L rail, R HHA, min assist for balance, and max multimodal cuing for hand & foot placement and sequencing. Pt then ambulates 400 ft with R HHA + 20 ft without UE support but min  assist overall. Pt continues to demonstrate slight kyphotic posture with short step length BLE and decreased gait speed - Tom reports this gait pattern is more like pt's PLOF. At end of session Tom exited & pt left in handoff to SLP.  Treatment 2: Pt received in TIS w/c  with husband Gershon Mussel) and daughter Cyril Mourning) present for session. No c/o pain reported. Transported pt to [employer] where pt transferred to standing and engaged in dynavision. Pt tolerated standing 5 minutes with steady/min assist but requires max cuing to locate red lights; pt intermittently pressing blue screen or requiring max multimodal cuing to turn head to locate lights. Pt unable to follow command "turn your head to the right" but does demonstrate overall improvement in ability to locate & press lights. Pt transferred w/c<>cybex kinetron with max cuing and education regarding task and technique. Pt utilized kinetron in sitting, up to 20 cm/sec resistance, with task focusing on BLE strengthening. At end of session pt left sitting in TIS w/c with QRB & chair alarm donned, sitting in room with husband present. Therapist continues to educate family on focus of treatment sessions and pt's current physical and cognitive abilities during sessions.  Therapy Documentation Precautions:  Precautions Precautions: Fall Precaution Comments: impaired vision Restrictions Weight Bearing Restrictions: No    See Function Navigator for Current Functional Status.  Therapy/Group: Individual Therapy  Waunita Schooner 12/15/2017, 3:17 PM

## 2017-12-15 NOTE — Progress Notes (Signed)
[facility] PHYSICAL MEDICINE & REHABILITATION     PROGRESS NOTE    Subjective/Complaints: No new complaints.  Just awakening when I arrived  ROS: pt denies nausea, vomiting, diarrhea, cough, shortness of breath or chest pain    Objective: Vital Signs: Blood pressure (!) 150/62, pulse 85, temperature 98.5 F (36.9 C), temperature source Oral, resp. rate 18, height 5\' 4"  (1.626 m), weight 56 kg (123 lb 7.3 oz), SpO2 98 %. Vas [place] Lower Extremity Venous (dvt)  Result Date: 12/09/2017  Lower Venous Study Indication: Swelling. Examination Guidelines: A complete evaluation includes B-mode imaging, spectral doppler, color doppler, and power doppler as needed of all accessible portions of each vessel. Bilateral testing is considered an integral part of a complete examination. Limited examinations for reoccurring indications may be performed as noted.  Right Venous Findings: +---------+---------------+---------+-----------+----------+-------+          CompressibilityPhasicitySpontaneityPropertiesSummary +---------+---------------+---------+-----------+----------+-------+ CFV      Full           Yes      Yes                          +---------+---------------+---------+-----------+----------+-------+ FV Prox  Full                                                 +---------+---------------+---------+-----------+----------+-------+ FV Mid   Full                                                 +---------+---------------+---------+-----------+----------+-------+ FV DistalFull                                                 +---------+---------------+---------+-----------+----------+-------+ PFV      Full                                                 +---------+---------------+---------+-----------+----------+-------+ POP      Full           Yes      Yes                          +---------+---------------+---------+-----------+----------+-------+ PTV      Full                                                  +---------+---------------+---------+-----------+----------+-------+ PERO     Full                                                 +---------+---------------+---------+-----------+----------+-------+ GSV      Full                                                 +---------+---------------+---------+-----------+----------+-------+  SSV      Full                                                 +---------+---------------+---------+-----------+----------+-------+  Left Venous Findings: +---------+---------------+---------+-----------+----------+-------+          CompressibilityPhasicitySpontaneityPropertiesSummary +---------+---------------+---------+-----------+----------+-------+ CFV      Full           Yes      Yes                          +---------+---------------+---------+-----------+----------+-------+ FV Prox  Full                                                 +---------+---------------+---------+-----------+----------+-------+ FV Mid   Full                                                 +---------+---------------+---------+-----------+----------+-------+ FV DistalFull                                                 +---------+---------------+---------+-----------+----------+-------+ PFV      Full                                                 +---------+---------------+---------+-----------+----------+-------+ POP      Full           Yes      Yes                          +---------+---------------+---------+-----------+----------+-------+ PTV      Full                                                 +---------+---------------+---------+-----------+----------+-------+ PERO     Full                                                 +---------+---------------+---------+-----------+----------+-------+ GSV      Full                                                  +---------+---------------+---------+-----------+----------+-------+ SSV      Full                                                 +---------+---------------+---------+-----------+----------+-------+  Final Interpretation: Right: There is no evidence of deep vein thrombosis in the lower extremity.There is no evidence of superficial venous thrombosis. No cystic structure found in the popliteal fossa. Left: There is no evidence of deep vein thrombosis in the lower extremity.There is no evidence of superficial venous thrombosis. No cystic structure found in the popliteal fossa.  *See table(s) above for measurements and observations. Electronically signed by Deitra Mayo on 12/09/2017 at 3:22:49 PM.     Recent Labs    12/13/17 0523  WBC 9.2  HGB 11.2*  HCT 35.5*  PLT 379   Recent Labs    12/15/17 0431  NA 141  K 4.0  CL 106  GLUCOSE 95  BUN 14  CREATININE 0.66  CALCIUM 8.9   CBG (last 3)  No results for input(s): GLUCAP in the last 72 hours.  Wt Readings from Last 3 Encounters:  12/07/17 56 kg (123 lb 7.3 oz)  12/04/17 56.7 kg (125 lb)  07/18/15 56.7 kg (125 lb)    Physical Exam:  Nursing note and vitals reviewed. Constitutional: She appears well-developed and well-nourished. No distress.  HENT:  Head: Normocephalic and atraumatic.  Eyes: Right eye exhibits no discharge. Left eye exhibits no discharge.    Neck: Normal range of motion. Neck supple.  Cardiovascular: Regular rate Respiratory: Clear with normal effort GI: Soft. Bowel sounds are normal. She exhibits no distension.  Musculoskeletal:  No edema or tenderness in extremities  Neurological: memory deficits.  Limited insight and awareness Skin: Skin is warm and dry. She is not diaphoretic.  Psychiatric: Pleasant and cooperative    Assessment/Plan: 1.  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 Physiatrist is providing close team supervision and 24 hour management of active medical problems listed below. Physiatrist and rehab team continue to assess barriers to discharge/monitor patient progress toward functional and medical goals.  Function:  Bathing Bathing position   Position: Wheelchair/chair at sink  Bathing parts Body parts bathed by patient: (UB only) Body parts bathed by helper: Right arm, Left arm, Chest, Abdomen  Bathing assist Assist Level: (total A)      Upper Body Dressing/Undressing Upper body dressing   What is the patient wearing?: Pull over shirt/dress       Pull over shirt/dress - Perfomed by helper: Thread/unthread right sleeve, Thread/unthread left sleeve, Put head through opening, Pull shirt over trunk        Upper body assist Assist Level: (dependent)      Lower Body Dressing/Undressing Lower body dressing   What is the patient wearing?: Pants, Non-skid slipper socks       Pants- Performed by helper: Thread/unthread right pants leg, Thread/unthread left pants leg, Pull pants up/down   Non-skid slipper socks- Performed by helper: Don/doff right sock, Don/doff left sock                  Lower body assist Assist for lower body dressing: (dependent)      Toileting Toileting Toileting activity did not occur: No continent bowel/bladder event   Toileting steps completed by helper: Adjust clothing prior to toileting, Performs perineal hygiene, Adjust clothing after toileting Toileting Assistive Devices: Grab bar or rail  Toileting assist Assist level: Two helpers   Transfers Chair/bed transfer Chair/bed transfer activity did not occur: Safety/medical concerns Chair/bed transfer method: Squat pivot Chair/bed transfer assist level: Moderate assist (Pt 50 - 74%/lift or lower) Chair/bed transfer  assistive device: Armrests     Locomotion Ambulation     Max distance: 100 ft Assist level: 2 helpers    Oceanographer activity did not occur: Safety/medical concerns     Assist Level: Dependent (Pt equals 0%)(for transport)  Cognition Comprehension Comprehension assist level: Understands basic 50 - 74% of the time/ requires cueing 25 - 49% of the time  Expression Expression assist level: Expresses basic 50 - 74% of the time/requires cueing 25 - 49% of the time. Needs to repeat parts of sentences.  Social Interaction Social Interaction assist level: Interacts appropriately 25 - 49% of time - Needs frequent redirection.  Problem Solving Problem solving assist level: Solves basic less than 25% of the time - needs direction nearly all the time or does not effectively solve problems and may need a restraint for safety  Memory Memory assist level: Recognizes or recalls less than 25% of the time/requires cueing greater than 75% of the time   Medical Problem List and Plan: 1.  Deficits with mobility, transfers, self-care, cognition secondary to Balint's syndrome.   -Continue therapies.     -team continues to address cognition and processing issues. 2.  DVT Prophylaxis/Anticoagulation: Pharmaceutical: Lovenox   -dopplers negative 3. Severe low back pain/Pain Management: tylenol prn   -kpad, Lidoderm patch   -Limited use of Robaxin 4. Mood: Team to provide ego support. LCSW to follow with patient and husband for support.   5. Neuropsych: This patient is not capable of making decisions on her own behalf.   -In low bed currently 6. Skin/Wound Care: routine pressure relief measures.  7. Fluids/Electrolytes/Nutrition: Monitor I/O. Offer supplements. Assist with meals due to visual deficits.   -Encourage intake and track ins and outs closely.   -Replace potassium as needed.    potassium 4.0 today 8. HTN: Monitor BP bid for trends--may need agent for better control.   9. Fever: Now afebrile   - BC X 2 neg and final.  2/13 UCS with multispecies, chest x-ray reviewed and is negative   -dopplers  negative      LOS (Days) 8 A FACE TO FACE EVALUATION WAS PERFORMED  Meredith Staggers, MD 12/15/2017 9:44 AM

## 2017-12-16 ENCOUNTER — Inpatient Hospital Stay (HOSPITAL_COMMUNITY): Payer: Medicare Other | Admitting: Speech Pathology

## 2017-12-16 ENCOUNTER — Inpatient Hospital Stay (HOSPITAL_COMMUNITY): Payer: Medicare Other | Admitting: Occupational Therapy

## 2017-12-16 ENCOUNTER — Inpatient Hospital Stay (HOSPITAL_COMMUNITY): Payer: Medicare Other | Admitting: Physical Therapy

## 2017-12-16 MED ORDER — ACETAMINOPHEN 325 MG PO TABS
650.0000 mg | ORAL_TABLET | Freq: Three times a day (TID) | ORAL | Status: DC
  Administered 2017-12-16 – 2017-12-27 (×43): 650 mg via ORAL
  Filled 2017-12-16 (×44): qty 2

## 2017-12-16 NOTE — Progress Notes (Signed)
Physical Therapy Session Note  Patient Details  Name: Tammy Arnold MRN: [id number] Date of Birth: [birth year]  Today's Date: 12/16/2017 PT Individual Time: 1300-1415 PT Individual Time Calculation (min): 75 min   Short Term Goals: Week 2:  PT Short Term Goal 1 (Week 2): Pt will complete furniture transfer from couch with mod assist +1. PT Short Term Goal 2 (Week 2): Pt will negotiate curb without rails but physical assist to simulate home environment.  Skilled Therapeutic Interventions/Progress Updates:    Pt seated in TIS chair in room, agreeable to participate in therapy session. Husband Gershon Mussel present throughout therapy session. Pt reports no pain today. Sit to stand with min assist. Ambulation 2 x 200, 1 x 400 ft with hand-held assist and min assist for balance. Pt requires max cueing with gait for obstacle avoidance and manual cues for direction change. Ascend/descend 12 stairs with 2 handrails and min assist, max verbal and manual cues for turning around at the top of the stairs and to initiate stepping up/down. Ascend/descend one 4" step in // bars to simulate home environment, pt requires max cueing to step up/down from step. Ascend/descend one 4" step outside of // bars with hand-held assist and max cueing. Pt is fearful with step due to visual and proprioceptive deficits. Pt left seated in TIS chair in room with needs in reach, chair alarm in place, quick-release belt in place.  Therapy Documentation Precautions:  Precautions Precautions: Fall Precaution Comments: impaired vision Restrictions Weight Bearing Restrictions: No  See Function Navigator for Current Functional Status.   Therapy/Group: Individual Therapy  Excell Seltzer, PT, DPT  12/16/2017, 3:46 PM

## 2017-12-16 NOTE — Progress Notes (Signed)
Initial Nutrition Assessment  DOCUMENTATION CODES:   Not applicable  INTERVENTION:   RD to order Magic Cup (vanilla) 3x/day with meals.   NUTRITION DIAGNOSIS:   Increased nutrient needs related to other (see comment), chronic illness(balint syndrome; rehab) as evidenced by estimated needs.  GOAL:   Patient will meet greater than or equal to 90% of their needs  MONITOR:   PO intake, Weight trends, Supplement acceptance  REASON FOR ASSESSMENT:   Consult Assessment of nutrition requirement/status  ASSESSMENT:   77 y.o.femalewith history of HTN, progressive cognitive decline over 2-3 years.Admitted on 2/10/19withfall and difficulty talking. Symptoms consistent with balint syndrome; patient with anomia, impaired cognition, optic ataxia, oculomotor apraxia and simultanagnosia.   SLP in room at time of visit. Provided dietary history as pt has altered mental status. SLP reports pt has been consuming on average 50-75% of meals today and yesterday. SLP feels pt is eating well.  Pt amenable to consuming magic cup.  Intern spoke with pt's daughter on the phone at bedside. Family reports pt has gradually lost weight over the past few years but that this was intentional through exercise and "healthy eating." Family reports a UBW of 130lb for the past few years. Daughter reports pt's husband was concerned about the pt's weight. Per family report, pt had poor p.o. Intake during the first few days of admission but reports she is doing much better now. Family stated concerns about B12 and need for MVI. Intern also discussed providing Magic Cup to pt to increase protein and calories for rehab.   Pt's wt has been stable from 2/13 to 2/22 as outlined below.   Medications: MVI, B12 tablet.  Labs reviewed.    NUTRITION - FOCUSED PHYSICAL EXAM:    Most Recent Value  Orbital Region  No depletion  Upper Arm Region  Mild depletion  Thoracic and Lumbar Region  Unable to assess  Buccal  Region  No depletion  Temple Region  Mild depletion  Clavicle Bone Region  Mild depletion  Clavicle and Acromion Bone Region  Moderate depletion  Scapular Bone Region  Moderate depletion  Dorsal Hand  Mild depletion  Patellar Region  Mild depletion  Anterior Thigh Region  Mild depletion  Posterior Calf Region  Mild depletion  Edema (RD Assessment)  Unable to assess  Hair  Reviewed  Eyes  Reviewed  Mouth  Reviewed  Skin  Reviewed  Nails  Reviewed       Diet Order:  Fall precautions Diet Heart Room service appropriate? Yes; Fluid consistency: Thin  EDUCATION NEEDS:   Not appropriate for education at this time  Skin:  Skin Assessment: Reviewed RN Assessment  Last BM:  2/22 type 4 large  Height:   Ht Readings from Last 1 Encounters:  12/07/17 5\' 4"  (1.626 m)    Weight:   Filed Weights   12/07/17 1209 12/16/17 0646  Weight: 123 lb 7.3 oz (56 kg) 123 lb 14.4 oz (56.2 kg)    Ideal Body Weight:  54.5 kg  BMI:  Body mass index is 21.27 kg/m.  Estimated Nutritional Needs:   Kcal:  8938-1017  Protein:  60-70g  Fluid:  >1.4L    Juno Alers, MS, Dietetic Intern Pager # [phone]

## 2017-12-16 NOTE — Progress Notes (Signed)
[facility] PHYSICAL MEDICINE & REHABILITATION     PROGRESS NOTE    Subjective/Complaints: Uneventful night. Back sore this morning  ROS: Limited due to cognitive/behavioral    Objective: Vital Signs: Blood pressure 139/90, pulse 82, temperature 98.7 F (37.1 C), temperature source Oral, resp. rate 18, height 5\' 4"  (1.626 m), weight 56.2 kg (123 lb 14.4 oz), SpO2 98 %. Vas [place] Lower Extremity Venous (dvt)  Result Date: 12/09/2017  Lower Venous Study Indication: Swelling. Examination Guidelines: A complete evaluation includes B-mode imaging, spectral doppler, color doppler, and power doppler as needed of all accessible portions of each vessel. Bilateral testing is considered an integral part of a complete examination. Limited examinations for reoccurring indications may be performed as noted.  Right Venous Findings: +---------+---------------+---------+-----------+----------+-------+          CompressibilityPhasicitySpontaneityPropertiesSummary +---------+---------------+---------+-----------+----------+-------+ CFV      Full           Yes      Yes                          +---------+---------------+---------+-----------+----------+-------+ FV Prox  Full                                                 +---------+---------------+---------+-----------+----------+-------+ FV Mid   Full                                                 +---------+---------------+---------+-----------+----------+-------+ FV DistalFull                                                 +---------+---------------+---------+-----------+----------+-------+ PFV      Full                                                 +---------+---------------+---------+-----------+----------+-------+ POP      Full           Yes      Yes                          +---------+---------------+---------+-----------+----------+-------+ PTV      Full                                                  +---------+---------------+---------+-----------+----------+-------+ PERO     Full                                                 +---------+---------------+---------+-----------+----------+-------+ GSV      Full                                                 +---------+---------------+---------+-----------+----------+-------+  SSV      Full                                                 +---------+---------------+---------+-----------+----------+-------+  Left Venous Findings: +---------+---------------+---------+-----------+----------+-------+          CompressibilityPhasicitySpontaneityPropertiesSummary +---------+---------------+---------+-----------+----------+-------+ CFV      Full           Yes      Yes                          +---------+---------------+---------+-----------+----------+-------+ FV Prox  Full                                                 +---------+---------------+---------+-----------+----------+-------+ FV Mid   Full                                                 +---------+---------------+---------+-----------+----------+-------+ FV DistalFull                                                 +---------+---------------+---------+-----------+----------+-------+ PFV      Full                                                 +---------+---------------+---------+-----------+----------+-------+ POP      Full           Yes      Yes                          +---------+---------------+---------+-----------+----------+-------+ PTV      Full                                                 +---------+---------------+---------+-----------+----------+-------+ PERO     Full                                                 +---------+---------------+---------+-----------+----------+-------+ GSV      Full                                                  +---------+---------------+---------+-----------+----------+-------+ SSV      Full                                                 +---------+---------------+---------+-----------+----------+-------+  Final Interpretation: Right: There is no evidence of deep vein thrombosis in the lower extremity.There is no evidence of superficial venous thrombosis. No cystic structure found in the popliteal fossa. Left: There is no evidence of deep vein thrombosis in the lower extremity.There is no evidence of superficial venous thrombosis. No cystic structure found in the popliteal fossa.  *See table(s) above for measurements and observations. Electronically signed by Deitra Mayo on 12/09/2017 at 3:22:49 PM.     No results for input(s): WBC, HGB, HCT, PLT in the last 72 hours. Recent Labs    12/15/17 0431  NA 141  K 4.0  CL 106  GLUCOSE 95  BUN 14  CREATININE 0.66  CALCIUM 8.9   CBG (last 3)  No results for input(s): GLUCAP in the last 72 hours.  Wt Readings from Last 3 Encounters:  12/16/17 56.2 kg (123 lb 14.4 oz)  12/04/17 56.7 kg (125 lb)  07/18/15 56.7 kg (125 lb)    Physical Exam:  Nursing note and vitals reviewed. Constitutional: She appears well-developed and well-nourished. No distress.  HENT:  Head: Normocephalic and atraumatic.  Eyes: Right eye exhibits no discharge. Left eye exhibits no discharge.    Neck: Normal range of motion. Neck supple.  Cardiovascular: RRR without murmur. No JVD  Respiratory: Normal effort GI: Soft. Bowel sounds are normal. She exhibits no distension.  Musculoskeletal:  No edema or tenderness in extremities  Neurological: memory deficits.  Limited insight and awareness Skin: Skin is warm and dry. She is not diaphoretic.  Psychiatric: Pleasant and cooperative    Assessment/Plan: 1.  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 Physiatrist is providing close team supervision and 24 hour management of active medical problems listed below. Physiatrist and rehab team continue to assess barriers to discharge/monitor patient progress toward functional and medical goals.  Function:  Bathing Bathing position   Position: Wheelchair/chair at sink  Bathing parts Body parts bathed by patient: (UB only) Body parts bathed by helper: Right arm, Left arm, Chest, Abdomen  Bathing assist Assist Level: (total A)      Upper Body Dressing/Undressing Upper body dressing   What is the patient wearing?: Pull over shirt/dress       Pull over shirt/dress - Perfomed by helper: Thread/unthread right sleeve, Thread/unthread left sleeve, Put head through opening, Pull shirt over trunk        Upper body assist Assist Level: (dependent)      Lower Body Dressing/Undressing Lower body dressing   What is the patient wearing?: Pants, Non-skid slipper socks       Pants- Performed by helper: Thread/unthread right pants leg, Thread/unthread left pants leg, Pull pants up/down   Non-skid slipper socks- Performed by helper: Don/doff right sock, Don/doff left sock                  Lower body assist Assist for lower body dressing: (dependent)      Toileting Toileting Toileting activity did not occur: No continent bowel/bladder event   Toileting steps completed by helper: Adjust clothing prior to toileting, Performs perineal hygiene, Adjust clothing after toileting Toileting Assistive Devices: Grab bar or rail  Toileting assist Assist level: Two helpers   Transfers Chair/bed transfer Chair/bed transfer activity did not occur: Safety/medical concerns Chair/bed transfer method: Squat pivot Chair/bed transfer assist level: Moderate assist (Pt 50 - 74%/lift or lower) Chair/bed transfer assistive device: Armrests  Locomotion Ambulation     Max distance: 400 ft Assist level: Touching or steadying assistance  (Pt > 75%)   Wheelchair Wheelchair activity did not occur: Safety/medical concerns     Assist Level: Dependent (Pt equals 0%)(for transport)  Cognition Comprehension Comprehension assist level: Understands basic 50 - 74% of the time/ requires cueing 25 - 49% of the time  Expression Expression assist level: Expresses basic 50 - 74% of the time/requires cueing 25 - 49% of the time. Needs to repeat parts of sentences.  Social Interaction Social Interaction assist level: Interacts appropriately 25 - 49% of time - Needs frequent redirection.  Problem Solving Problem solving assist level: Solves basic less than 25% of the time - needs direction nearly all the time or does not effectively solve problems and may need a restraint for safety  Memory Memory assist level: Recognizes or recalls less than 25% of the time/requires cueing greater than 75% of the time   Medical Problem List and Plan: 1.  Deficits with mobility, transfers, self-care, cognition secondary to Balint's syndrome.   -Continue therapies.     -ongoing cognitive deficits limit carry over 2.  DVT Prophylaxis/Anticoagulation: Pharmaceutical: Lovenox   -dopplers negative 3. Severe low back pain/Pain Management: tylenol prn   -kpad, Lidoderm patch 4. Mood: Team to provide ego support. LCSW to follow with patient and husband for support.   5. Neuropsych: This patient is not capable of making decisions on her own behalf.   -In low bed currently 6. Skin/Wound Care: routine pressure relief measures.  7. Fluids/Electrolytes/Nutrition: Monitor I/O. Offer supplements. Assist with meals due to visual deficits.   -Encourage intake and track ins and outs closely.   -Replace potassium as needed.    potassium 4.0 2/21 8. HTN: Monitor BP bid for trends--may need agent for better control.   9. Fever: Now afebrile   - BC X 2 neg and final.  2/13 UCS with multispecies, chest x-ray reviewed and is negative   -dopplers negative      LOS (Days)  9 A FACE TO FACE EVALUATION WAS PERFORMED  Euclid Cassetta T, MD 12/16/2017 9:02 AM

## 2017-12-16 NOTE — Progress Notes (Signed)
Speech Language Pathology Daily Session Note  Patient Details  Name: Tammy Arnold MRN: [id number] Date of Birth: 06/21/1941  Today's Date: 12/16/2017 SLP Individual Time: 1030-1130 SLP Individual Time Calculation (min): 60 min  Short Term Goals: Week 2: SLP Short Term Goal 1 (Week 2): Patient will self-monitor and correct errors during functional tasks with Max A multimodal cues.  SLP Short Term Goal 2 (Week 2): Patient will visually scan to locate specific items during functional tasks with Mod A multimodal cues.  SLP Short Term Goal 3 (Week 2): Patient will demonstrate functional problem solving with functional and familair tasks with Max A multimodal cues.  SLP Short Term Goal 4 (Week 2): Patient will demonstrate selective attention to functional tasks for ~10 minutes with Min A verbal cues for redirection.  SLP Short Term Goal 5 (Week 2): Patient will utilize exernal aids for orientation to place and time with Max A multimodal cues.   Skilled Therapeutic Interventions: Skilled treatment session focused on cognitive goals. Upon arrival, patient independently requested to use the bathroom.  Patient transferred to the commode with Min A verbal cues and was continent of bladder. Patient required Max-Total A verbal cues for problem solving in regards to hygiene and clothing management. SLP also facilitated session by providing Total A for orientation to place and situation as well as for utilization of external memory aids. Patient also required Max A verbal, tactile and visual cues for visual scanning and attention during a basic problem solving and recall task that focused on making a family tree of her children and grandchildren while utilizing large, bright sticky notes with her family's name written on them. However, patient completed task with 100% accuracy when given choices from a field of 2.  Patient also participated in a basic card task with Mod A verbal cues needed for attention and  problem solving throughout. Patient left upright in wheelchair with chair alarm on and quick release belt in place. Continue with current plan of care.                                      Function:   Cognition Comprehension Comprehension assist level: Understands basic 50 - 74% of the time/ requires cueing 25 - 49% of the time  Expression   Expression assist level: Expresses basic 50 - 74% of the time/requires cueing 25 - 49% of the time. Needs to repeat parts of sentences.  Social Interaction Social Interaction assist level: Interacts appropriately 50 - 74% of the time - May be physically or verbally inappropriate.  Problem Solving Problem solving assist level: Solves basic less than 25% of the time - needs direction nearly all the time or does not effectively solve problems and may need a restraint for safety  Memory Memory assist level: Recognizes or recalls less than 25% of the time/requires cueing greater than 75% of the time    Pain No/Denies Pain   Therapy/Group: Individual Therapy  Marielis Samara 12/16/2017, 12:25 PM

## 2017-12-16 NOTE — Progress Notes (Signed)
Occupational Therapy Session Note  Patient Details  Name: Tammy Arnold MRN: [id number] Date of Birth: September 08, 1941  Today's Date: 12/16/2017 OT Individual Time: 0856-1000 OT Individual Time Calculation (min): 64 min   Short Term Goals: Week 1:  OT Short Term Goal 1 (Week 1): Pt will scan to items to locate items needed for self care with mod multimodal cues.  OT Short Term Goal 2 (Week 1): Pt will perform toileting with max A to decrease level of assist with self care. OT Short Term Goal 3 (Week 1): Pt will perform toilet transfer with mod A for 3 consecutive sessions.  Skilled Therapeutic Interventions/Progress Updates:    Pt greeted supine in bed with RN present. With max multimodal and calming cues, pt sat EOB with 2 helpers. Mod A sit<stand and stand step transfer to TIS after 3 attempts.  Pt tends to exhibit anxious behaviors in standing and would initiate stand<sit back on bed. Once seated in w/c, pt engaged in UB/LB dressing. Able to lift each LE on cue for OT to thread legs into pants. Pt standing with Min A at sink for OT to lift pants over hips, pt appearing calm and following instruction. Once seated, pt donned shirt. Able to complete 3/4 components once OT started her off at Rt arm. Once it was realized shirt was on backwards, shirt was taken off. She was unable to don it with Min A again, and required max step by step cues for sequencing. Becoming increasingly distracted by items on sink and floor. Required Mod A to don shirt the 2nd time with significantly increased time. Pt reported she was holding onto shirt when she was rubbing hands on sink or holding OT's glove. Afterwards pt engaged in handwashing and hairbrushing w/c level. Pt requiring HOH assist to locate water and keep hands underneath. She was able to lather hands with soap and turn off the faucet afterwards. Pt brushed hair for about 20 seconds once OT released HOH assist. However, pt unable to brush evenly and tended to  brush in an upward vs. downward motion. At end of session pt was reclined in TIS with safety belt fastened and call bell in lap. Put on Big Band music at low volume for calming purposes. Pt singing and tapping along to music at session exit.   Therapy Documentation Precautions:  Precautions Precautions: Fall Precaution Comments: impaired vision Restrictions Weight Bearing Restrictions: No Vital Signs: Therapy Vitals Temp: 98.4 F (36.9 C) Temp Source: Oral Pulse Rate: 85 Resp: 17 BP: (!) 144/75 Patient Position (if appropriate): Sitting Oxygen Therapy SpO2: 100 % O2 Device: Not Delivered Pain: Facial grimacing when transitioning OOB   ADL:      See Function Navigator for Current Functional Status.   Therapy/Group: Individual Therapy  Henessy Rohrer A Nara Paternoster 12/16/2017, 4:23 PM

## 2017-12-16 NOTE — Plan of Care (Signed)
  Progressing RH BOWEL ELIMINATION RH STG MANAGE BOWEL WITH ASSISTANCE Description STG Manage Bowel with mod Assistance.  12/16/2017 0315 - Progressing by Evelena Asa, RN RH STG MANAGE BOWEL W/MEDICATION W/ASSISTANCE Description STG Manage Bowel with Medication with mod Assistance.  12/16/2017 0315 - Progressing by Evelena Asa, RN RH BLADDER ELIMINATION RH STG MANAGE BLADDER WITH ASSISTANCE Description STG Manage Bladder With mod Assistance  12/16/2017 0315 - Progressing by Evelena Asa, RN RH SKIN INTEGRITY RH STG SKIN FREE OF INFECTION/BREAKDOWN Description Patients skin will remain free from further infection or breakdown with mod assist.  12/16/2017 0315 - Progressing by Evelena Asa, RN RH STG MAINTAIN SKIN INTEGRITY WITH ASSISTANCE Description STG Maintain Skin Integrity With mod Assistance.  12/16/2017 0315 - Progressing by Evelena Asa, RN RH SAFETY RH STG ADHERE TO SAFETY PRECAUTIONS W/ASSISTANCE/DEVICE Description STG Adhere to Safety Precautions With max Assistance/Device.  12/16/2017 0315 - Progressing by Evelena Asa, RN

## 2017-12-17 ENCOUNTER — Inpatient Hospital Stay (HOSPITAL_COMMUNITY): Payer: Medicare Other | Admitting: Physical Therapy

## 2017-12-17 ENCOUNTER — Inpatient Hospital Stay (HOSPITAL_COMMUNITY): Payer: Medicare Other | Admitting: Occupational Therapy

## 2017-12-17 NOTE — Progress Notes (Signed)
Occupational Therapy Weekly Progress Note  Patient Details  Name: Tammy Arnold MRN: [id number] Date of Birth: Aug 01, 1941  Beginning of progress report period: 12/08/17 End of progress report period: 12/17/17  Today's Date: 12/17/2017 OT Individual Time: 1400-1500 OT Individual Time Calculation (min): 60 min   Patient has met 0 of 3 short term goals.    Pt is making inconsistent progress at time of report. Her functional status fluctuates depending on the day and also time of day. She has exhibited the ability to complete UB dressing with Min A and min vcs, but is unable to repeat within 30 seconds requiring the same assist. She remains to be Total A for LB self care. Toilet transfer assist ranges from Min-2 helpers. Pts progress continues to be limited by severe cognitive deficits. Family education has been ongoing with spouse and her two sons. Continue with POC.   Patient continues to demonstrate the following deficits: muscle weakness, decreased cardiorespiratoy endurance, impaired timing and sequencing, motor apraxia, ataxia, decreased coordination and decreased motor planning, decreased visual acuity, decreased visual perceptual skills and decreased visual motor skills, decreased motor planning and ideational apraxia, decreased initiation, decreased attention, decreased awareness, decreased problem solving, decreased safety awareness, decreased memory and delayed processing and decreased sitting balance, decreased standing balance, decreased postural control and decreased balance strategies and therefore Arnold continue to benefit from skilled OT intervention to enhance overall performance with BADL.  Patient progressing toward long term goals..  Continue plan of care.  OT Short Term Goals Week 1:  OT Short Term Goal 1 (Week 1): Pt Arnold scan to items to locate items needed for self care with mod multimodal cues.  OT Short Term Goal 1 - Progress (Week 1): Not progressing OT Short Term Goal 2  (Week 1): Pt Arnold perform toileting with max A to decrease level of assist with self care. OT Short Term Goal 2 - Progress (Week 1): Not progressing OT Short Term Goal 3 (Week 1): Pt Arnold perform toilet transfer with mod A for 3 consecutive sessions. OT Short Term Goal 3 - Progress (Week 1): Not progressing Week 2:  OT Short Term Goal 1 (Week 2): Pt Arnold maintain sustained attention to functional task for 4 minutes with mod multimodal cues OT Short Term Goal 2 (Week 2): Pt Arnold complete 1 grooming task in standing to improve balance  OT Short Term Goal 3 (Week 2): Pt Arnold complete UB dressing with Mod A during 2 ADL sessions   Skilled Therapeutic Interventions/Progress Updates:    Pt greeted in TIS with sons present. Agreeable to tx and no c/o pain. She completed hand washing at sink with min vcs, able to use soap dispenser with modeling provided by therapist. Had her brush teeth next, with pt requiring max multimodal cues for self organization and scanning for necessary items. Escorted pt to dayroom and worked on balance, motor planning, and coordination via familiar task of table washing. Pt side stepping around elevated table with Min A, bending out of base of support with instruction. She required mod cues for reaching all areas, but was attentive throughout in mod-max stimulating environment. Also able to correctly count 3 wash cloths placed on table. Returned pt to room and worked on Holiday representative task to continue to work on Production assistant, radio. With modeling and repetition, pt unable to fold shirt, wash cloth, or towel. She was then reclined in TIS with safety belt fastened and sons present.   Therapy Documentation Precautions:  Precautions Precautions: Fall Precaution  Comments: impaired vision Restrictions Weight Bearing Restrictions: No Vital Signs: Therapy Vitals Temp: 98.2 F (36.8 C) Temp Source: Oral Pulse Rate: 87 Resp: 18 BP: 119/63 Patient Position (if appropriate):  Sitting Oxygen Therapy SpO2: 96 % O2 Device: Not Delivered Pain: Pain Assessment Pain Score: 0-No pain ADL:      See Function Navigator for Current Functional Status.   Therapy/Group: Individual Therapy  Denson Niccoli A Keagen Heinlen 12/17/2017, 4:13 PM

## 2017-12-17 NOTE — Progress Notes (Signed)
Physical Therapy Session Note  Patient Details  Name: Tammy Arnold MRN: [id number] Date of Birth: 1941/06/13  Today's Date: 12/17/2017 PT Individual Time: 3762-8315 PT Individual Time Calculation (min): 31 min   Short Term Goals: Week 2:  PT Short Term Goal 1 (Week 2): Pt will complete furniture transfer from couch with mod assist +1. PT Short Term Goal 2 (Week 2): Pt will negotiate curb without rails but physical assist to simulate home environment.  Skilled Therapeutic Interventions/Progress Updates:    No c/o pain, agreeable to therapy session.  Session focus on attention to task and functional transfers for toileting/hygiene.    Pt transfers sit<>stand throughout session with overall supervision/steady assist for safety.  Pt reporting feeling wet when ambulating out of room so returned to bathroom, pt noted to be incontinent of large amount of stool.  Repeated sit<>stand, side stepping, and standing balance for hygiene and clothing management.  PT provided total assist for toileting to reduce risk of pt getting clothing soiled.  Hand hygiene at sink with total assist to ensure thoroughness 2/2 pt with stool on her fingers.  Returned to w/c at end of session and positioned with QRB in place, call bell in reach and needs met.   Therapy Documentation Precautions:  Precautions Precautions: Fall Precaution Comments: impaired vision Restrictions Weight Bearing Restrictions: No   See Function Navigator for Current Functional Status.   Therapy/Group: Individual Therapy  Michel Santee 12/17/2017, 12:12 PM

## 2017-12-17 NOTE — Progress Notes (Signed)
Patient ID: Tammy Arnold, female   DOB: July 21, 1941, 77 y.o.   MRN: [id number]   Tammy Arnold is a 77 y.o. 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   Subjective: No new concerns.  Cognitively impaired-physician husband present at bedside   Past Medical History:  Diagnosis Date  . Arthritis   . High cholesterol   . Hypertension      Objective: Vital signs in last 24 hours: Temp:  [98.3 F (36.8 C)-98.4 F (36.9 C)] 98.3 F (36.8 C) (02/23 0443) Pulse Rate:  [80-85] 80 (02/23 0443) Resp:  [17-18] 18 (02/23 0443) BP: (130-144)/(66-75) 130/66 (02/23 0443) SpO2:  [98 %-100 %] 98 % (02/23 0443) Weight change:  Last BM Date: 12/16/17  Intake/Output from previous day: 02/22 0701 - 02/23 0700 In: 600 [P.O.:600] Out: -  Last cbgs: CBG (last 3)  No results for input(s): GLUCAP in the last 72 hours.  Patient Vitals for the past 24 hrs:  BP Temp Temp src Pulse Resp SpO2  12/17/17 0443 130/66 98.3 F (36.8 C) Oral 80 18 98 %  12/16/17 1503 (!) 144/75 98.4 F (36.9 C) Oral 85 17 100 %   Physical Exam General: No apparent distress;  cognitively impaired HEENT: Well-hydrated Lungs: Normal effort. Lungs clear to auscultation, no crackles or wheezes. Cardiovascular: Regular rate and rhythm, no edema Abdomen: S/NT/ND; BS(+) Musculoskeletal:  unchanged Neurological: No new neurological deficits; unable to perform finger to nose testing but no arm weakness Extremities- no edema  Skin: clear   Mental state: Alert, oriented, cooperative    Lab Results: BMET    Component Value Date/Time   NA 141 12/15/2017 0431   K 4.0 12/15/2017 0431   CL 106 12/15/2017 0431   CO2 24 12/15/2017 0431   GLUCOSE 95 12/15/2017 0431   BUN 14 12/15/2017 0431   CREATININE 0.66 12/15/2017 0431   CALCIUM 8.9 12/15/2017 0431   GFRNONAA >60 12/15/2017 0431   GFRAA >60 12/15/2017 0431   CBC    Component Value Date/Time   WBC 9.2  12/13/2017 0523   RBC 4.12 12/13/2017 0523   HGB 11.2 (L) 12/13/2017 0523   HCT 35.5 (L) 12/13/2017 0523   HCT 34.0 12/05/2017 0729   PLT 379 12/13/2017 0523   MCV 86.2 12/13/2017 0523   MCH 27.2 12/13/2017 0523   MCHC 31.5 12/13/2017 0523   RDW 13.3 12/13/2017 0523   LYMPHSABS 1.7 12/08/2017 0644   MONOABS 0.7 12/08/2017 0644   EOSABS 0.0 12/08/2017 0644   BASOSABS 0.0 12/08/2017 0644    Studies/Results: No results found.  Medications: I have reviewed the patient's current medications.  Assessment/Plan:  Functional deficits   secondary to cerebral atrophy associated with Balint's Syndrome History of low back pain.  Stable Essential hypertension.  Well controlled    Length of stay, days: [place] , MD 12/17/2017, 11:23 AM

## 2017-12-18 ENCOUNTER — Inpatient Hospital Stay (HOSPITAL_COMMUNITY): Payer: Medicare Other

## 2017-12-18 NOTE — Progress Notes (Signed)
Physical Therapy Session Note  Patient Details  Name: Tammy Arnold MRN: [id number] Date of Birth: 1940/11/26  Today's Date: 12/18/2017 PT Individual Time: 0901-0959 PT Individual Time Calculation (min): 58 min   Short Term Goals: Week 2:  PT Short Term Goal 1 (Week 2): Pt will complete furniture transfer from couch with mod assist +1. PT Short Term Goal 2 (Week 2): Pt will negotiate curb without rails but physical assist to simulate home environment.  Skilled Therapeutic Interventions/Progress Updates:   Pt supine in bed upon PT arrival, denies pain. Pt requiring max encouragement to participate in therapy this session. Pt transferred from supine>sitting EOB with total assist and donned pants total assist. Pt ambulated from bed>hallway with min assist, no AD, max verbal cues to navigate around furniture in room and to sit in w/c. Pt transported to gym in [place]. Worked on gait training without AD and following commands for turning directions, pt ambulated 3 x 200 ft with max tactile and verbal cues to navigate throughout unit. Pt worked on dynamic balance and following commands in order to reach for bean bags and toss into bucket. Pt ambulated to rehab apartment and worked on navigating around furniture, finding household items such as pantry and fridge, max verbal cues and hand over hand assist to open cabinets/turn on light switch. Pt ascended/descended 4 steps with B handrails and min assist, max verbal/tactile cues to turn at top of steps. Pt ambulated back to room and left seated in TIS w/c with needs in reach, QRB in place and husband present.    Therapy Documentation Precautions:  Precautions Precautions: Fall Precaution Comments: impaired vision Restrictions Weight Bearing Restrictions: No   See Function Navigator for Current Functional Status.   Therapy/Group: Individual Therapy  Netta Corrigan, PT, DPT 12/18/2017, 7:45 AM

## 2017-12-18 NOTE — Progress Notes (Signed)
Pt very frustrated and angry , feels very frustrated  Appears increasingly confused at times with regard to her current situation.  Pt incontinent of bowel as well. Has difficulty following commands,  appears to be resiting turning when trying to clean her. I believe this is due to her dx neurological syndrome. Has had  A couple of emotional outburst when expressing her confusion regarding her current situation. She states she doesn't understand what's going on.  She  Became calm and appeared more relaxed once cleaned, room darkened and tv tuned to a program she appeared interested in.  Will continue to monitor.

## 2017-12-18 NOTE — Progress Notes (Signed)
Patient ID: Tammy Arnold, female   DOB: September 17, 1941, 77 y.o.   MRN: [id number]   Tammy Arnold is a 77 y.o. female admitted for CIR with cognitive and functional deficits associated with cerebral atrophy and Balint's  syndrome.  Subjective: No new complaints.  Confused.  Aware she is in the hospital but little insight into her deficits.  Objective: Vital signs in last 24 hours: Temp:  [98.2 F (36.8 C)-98.6 F (37 C)] 98.6 F (37 C) (02/24 0520) Pulse Rate:  [81-87] 81 (02/24 0520) Resp:  [12-18] 12 (02/24 0520) BP: (119-133)/(63-65) 133/65 (02/24 0520) SpO2:  [96 %-97 %] 97 % (02/24 0520) Weight:  [120 lb (54.4 kg)] 120 lb (54.4 kg) (02/24 0520) Weight change:  Last BM Date: 12/17/17  Intake/Output from previous day: 02/23 0701 - 02/24 0700 In: 720 [P.O.:720] Out: -    Physical Exam General: No apparent distress   HEENT: Well-hydrated Lungs: Normal effort. Lungs clear to auscultation, no crackles or wheezes. Cardiovascular: Regular rate and rhythm, no edema Abdomen: S/NT/ND; BS(+) Musculoskeletal:  unchanged Neurological: No new neurological deficits; performs finger to nose testing better compared to yesterday Wounds: N/A    Skin: clear   Mental state: Alert, oriented, cooperative    Lab Results: BMET    Component Value Date/Time   NA 141 12/15/2017 0431   K 4.0 12/15/2017 0431   CL 106 12/15/2017 0431   CO2 24 12/15/2017 0431   GLUCOSE 95 12/15/2017 0431   BUN 14 12/15/2017 0431   CREATININE 0.66 12/15/2017 0431   CALCIUM 8.9 12/15/2017 0431   GFRNONAA >60 12/15/2017 0431   GFRAA >60 12/15/2017 0431   CBC    Component Value Date/Time   WBC 9.2 12/13/2017 0523   RBC 4.12 12/13/2017 0523   HGB 11.2 (L) 12/13/2017 0523   HCT 35.5 (L) 12/13/2017 0523   HCT 34.0 12/05/2017 0729   PLT 379 12/13/2017 0523   MCV 86.2 12/13/2017 0523   MCH 27.2 12/13/2017 0523   MCHC 31.5 12/13/2017 0523   RDW 13.3 12/13/2017 0523   LYMPHSABS 1.7 12/08/2017 0644   MONOABS 0.7 12/08/2017 0644   EOSABS 0.0 12/08/2017 0644   BASOSABS 0.0 12/08/2017 0644    Studies/Results: No results found.  Medications: I have reviewed the patient's current medications.  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  No complaints today Essential hypertension well-controlled    Length of stay, days: [place] , MD 12/18/2017, 8:52 AM

## 2017-12-19 ENCOUNTER — Inpatient Hospital Stay (HOSPITAL_COMMUNITY): Payer: Medicare Other | Admitting: Physical Therapy

## 2017-12-19 ENCOUNTER — Inpatient Hospital Stay (HOSPITAL_COMMUNITY): Payer: Medicare Other | Admitting: Occupational Therapy

## 2017-12-19 ENCOUNTER — Inpatient Hospital Stay (HOSPITAL_COMMUNITY): Payer: Medicare Other | Admitting: Speech Pathology

## 2017-12-19 DIAGNOSIS — D62 Acute posthemorrhagic anemia: Secondary | ICD-10-CM

## 2017-12-19 NOTE — Progress Notes (Signed)
[facility]     PROGRESS NOTE    Subjective/Complaints: Patient seen lying in bed this morning.  Husband at bedside.  She states she slept well overnight.  Per husband patient slept well overnight and had a fair weekend.  ROS: Limited due to cognitive/behavioral    Objective: Vital Signs: Blood pressure 129/71, pulse 82, temperature 97.7 F (36.5 C), temperature source Oral, resp. rate 17, height 5\' 4"  (1.626 m), weight 53.1 kg (117 lb), SpO2 99 %. Vas [place] Lower Extremity Venous (dvt)  Result Date: 12/09/2017  Lower Venous Study Indication: Swelling. Examination Guidelines: A complete evaluation includes B-mode imaging, spectral doppler, color doppler, and power doppler as needed of all accessible portions of each vessel. Bilateral testing is considered an integral part of a complete examination. Limited examinations for reoccurring indications may be performed as noted.  Right Venous Findings: +---------+---------------+---------+-----------+----------+-------+          CompressibilityPhasicitySpontaneityPropertiesSummary +---------+---------------+---------+-----------+----------+-------+ CFV      Full           Yes      Yes                          +---------+---------------+---------+-----------+----------+-------+ FV Prox  Full                                                 +---------+---------------+---------+-----------+----------+-------+ FV Mid   Full                                                 +---------+---------------+---------+-----------+----------+-------+ FV DistalFull                                                 +---------+---------------+---------+-----------+----------+-------+ PFV      Full                                                 +---------+---------------+---------+-----------+----------+-------+ POP      Full           Yes      Yes                           +---------+---------------+---------+-----------+----------+-------+ PTV      Full                                                 +---------+---------------+---------+-----------+----------+-------+ PERO     Full                                                 +---------+---------------+---------+-----------+----------+-------+ GSV      Full                                                 +---------+---------------+---------+-----------+----------+-------+  SSV      Full                                                 +---------+---------------+---------+-----------+----------+-------+  Left Venous Findings: +---------+---------------+---------+-----------+----------+-------+          CompressibilityPhasicitySpontaneityPropertiesSummary +---------+---------------+---------+-----------+----------+-------+ CFV      Full           Yes      Yes                          +---------+---------------+---------+-----------+----------+-------+ FV Prox  Full                                                 +---------+---------------+---------+-----------+----------+-------+ FV Mid   Full                                                 +---------+---------------+---------+-----------+----------+-------+ FV DistalFull                                                 +---------+---------------+---------+-----------+----------+-------+ PFV      Full                                                 +---------+---------------+---------+-----------+----------+-------+ POP      Full           Yes      Yes                          +---------+---------------+---------+-----------+----------+-------+ PTV      Full                                                 +---------+---------------+---------+-----------+----------+-------+ PERO     Full                                                  +---------+---------------+---------+-----------+----------+-------+ GSV      Full                                                 +---------+---------------+---------+-----------+----------+-------+ SSV      Full                                                 +---------+---------------+---------+-----------+----------+-------+  Final Interpretation: Right: There is no evidence of deep vein thrombosis in the lower extremity.There is no evidence of superficial venous thrombosis. No cystic structure found in the popliteal fossa. Left: There is no evidence of deep vein thrombosis in the lower extremity.There is no evidence of superficial venous thrombosis. No cystic structure found in the popliteal fossa.  *See table(s) above for measurements and observations. Electronically signed by Deitra Mayo on 12/09/2017 at 3:22:49 PM.     No results for input(s): WBC, HGB, HCT, PLT in the last 72 hours. No results for input(s): NA, K, CL, GLUCOSE, BUN, CREATININE, CALCIUM in the last 72 hours.  Invalid input(s): CO CBG (last 3)  No results for input(s): GLUCAP in the last 72 hours.  Wt Readings from Last 3 Encounters:  12/19/17 53.1 kg (117 lb)  12/04/17 56.7 kg (125 lb)  07/18/15 56.7 kg (125 lb)    Physical Exam:  Nursing note and vitals reviewed. Constitutional: She appears well-developed and well-nourished. No distress.  HENT: Normocephalic and atraumatic.  Eyes: No discharge.  EOMI.  Cardiovascular: RRR. No JVD  Respiratory: Normal effort.  Clear. GI: Bowel sounds are normal. She exhibits no distension.  Musculoskeletal: No edema or tenderness in extremities  Neurological: Alert and oriented x1 Memory deficits.  Limited insight and awareness Motor: Bilateral upper extremities: 5/5 proximal to distal Left lower extremity: Hip flexion 4+/5, distally 5/5 Right lower extremity: Hip flexion 3+/5, knee extension 4/5, ankle dorsi/plantar flexion 4+/5 Skin: Skin is warm and dry.  She is not diaphoretic.  Psychiatric: Pleasant and cooperative   Assessment/Plan: 1.  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 Physiatrist is providing close team supervision and 24 hour management of active medical problems listed below. Physiatrist and rehab team continue to assess barriers to discharge/monitor patient progress toward functional and medical goals.  Function:  Bathing Bathing position   Position: Wheelchair/chair at sink  Bathing parts Body parts bathed by patient: (UB only) Body parts bathed by helper: Right arm, Left arm, Chest, Abdomen  Bathing assist Assist Level: (total A)      Upper Body Dressing/Undressing Upper body dressing   What is the patient wearing?: Pull over shirt/dress     Pull over shirt/dress - Perfomed by patient: Thread/unthread left sleeve, Put head through opening, Pull shirt over trunk Pull over shirt/dress - Perfomed by helper: Thread/unthread right sleeve        Upper body assist Assist Level: Touching or steadying assistance(Pt > 75%)      Lower Body Dressing/Undressing Lower body dressing   What is the patient wearing?: Pants, Non-skid slipper socks       Pants- Performed by helper: Thread/unthread right pants leg, Thread/unthread left pants leg, Pull pants up/down   Non-skid slipper socks- Performed by helper: Don/doff right sock, Don/doff left sock                  Lower body assist Assist for lower body dressing: (dependent)      Toileting Toileting Toileting activity did not occur: No continent bowel/bladder event   Toileting steps completed by helper: Adjust clothing prior to toileting, Performs perineal hygiene, Adjust clothing after toileting Toileting Assistive Devices: Grab bar or rail  Toileting assist Assist level: (total assist)   Transfers Chair/bed transfer Chair/bed  transfer activity did not occur: Safety/medical concerns Chair/bed transfer method: Ambulatory Chair/bed transfer assist level: Touching or steadying assistance (Pt >  75%) Chair/bed transfer assistive device: Armrests     Locomotion Ambulation     Max distance: 200 ft Assist level: Touching or steadying assistance (Pt > 75%)   Wheelchair Wheelchair activity did not occur: Safety/medical concerns     Assist Level: Dependent (Pt equals 0%)(for transport)  Cognition Comprehension Comprehension assist level: Understands basic 50 - 74% of the time/ requires cueing 25 - 49% of the time  Expression Expression assist level: Expresses basic 50 - 74% of the time/requires cueing 25 - 49% of the time. Needs to repeat parts of sentences.  Social Interaction Social Interaction assist level: Interacts appropriately 50 - 74% of the time - May be physically or verbally inappropriate.  Problem Solving Problem solving assist level: Solves basic less than 25% of the time - needs direction nearly all the time or does not effectively solve problems and may need a restraint for safety  Memory Memory assist level: Recognizes or recalls less than 25% of the time/requires cueing greater than 75% of the time   Medical Problem List and Plan: 1.  Deficits with mobility, transfers, self-care, cognition secondary to Balint's syndrome.   Continue CIR.   2.  DVT Prophylaxis/Anticoagulation: Pharmaceutical: Lovenox    Dopplers negative 3. Severe low back pain/Pain Management: tylenol prn   Kpad, Lidoderm patch 4. Mood: Team to provide ego support. LCSW to follow with patient and husband for support.   5. Neuropsych: This patient is not capable of making decisions on her own behalf.   In low bed currently 6. Skin/Wound Care: routine pressure relief measures.  7. Fluids/Electrolytes/Nutrition: Monitor I/O. Offer supplements. Assist with meals due to visual deficits.   Encourage intake and track ins and outs  closely.   BMP within acceptable range on 2/21 8. HTN: Monitor BP bid    Relatively controlled on 2/25  9. Fever: Resolved   BC X 2 neg and final.  2/13 UCS with multispecies, chest x-ray reviewed and is negative   Dopplers negative 10.  Acute blood loss anemia   Hemoglobin 11.2 on 2/19   Labs ordered for tomorrow      LOS (Days) 12 A FACE TO FACE EVALUATION WAS PERFORMED  Morton Simson Lorie Phenix, MD 12/19/2017 11:16 AM

## 2017-12-19 NOTE — Progress Notes (Signed)
Occupational Therapy Session Note  Patient Details  Name: Tammy Arnold MRN: [id number] Date of Birth: [birth year]  Today's Date: 12/19/2017 OT Individual Time: 8469-6295 OT Individual Time Calculation (min): 57 min   Short Term Goals: Week 2:  OT Short Term Goal 1 (Week 2): Pt will maintain sustained attention to functional task for 4 minutes with mod multimodal cues OT Short Term Goal 2 (Week 2): Pt will complete 1 grooming task in standing to improve balance  OT Short Term Goal 3 (Week 2): Pt will complete UB dressing with Mod A during 2 ADL sessions   Skilled Therapeutic Interventions/Progress Updates:    Pt greeted in TIS. No c/o pain. Tx focus on coordination, memory, awareness, vision, motor planning, and ADL retraining. Started session with reviewing names of family members in personal photo album, with pt requiring max multimodal cues for consistent recall of children and grandchildren, though smiling when looking at their pictures. Transitioned to pt completing oral care/grooming tasks while standing at sink for combined balance and cognitive challenges. Pt requiring max multimodal cues for motor planning, scanning, and step by step instruction for executing all motor tasks. Min A sit<stand at sink. She required 3 seated rest breaks due to fatigue. Afterwards had her don and doff a pillowcase with modeling and step by step instruction provided. Pt unable to exhibit carryover of education with extra time and repetition. Ultimately required Max A to complete these tasks. Pt visibly frustrated with her functional performance, verbalizing repeatedly "This should be simple." At end of tx pt was reclined in TIS, safety belt fastened, family photo album and call bell within reach. Played playlist ofThe Big Band on room computer at low volume for promoting relaxation and calmness.   Therapy Documentation Precautions:  Precautions Precautions: Fall Precaution Comments: impaired  vision Restrictions Weight Bearing Restrictions: No Pain: No c/o pain during session  Pain Assessment Pain Score: 0-No pain ADL:  :    See Function Navigator for Current Functional Status.   Therapy/Group: Individual Therapy  Shloka Baldridge A Phil Corti 12/19/2017, 12:16 PM

## 2017-12-19 NOTE — Progress Notes (Signed)
Speech Language Pathology Daily Session Note  Patient Details  Name: Tammy Arnold MRN: [id number] Date of Birth: 1940/12/18  Today's Date: 12/19/2017 SLP Individual Time: 1030-1130 SLP Individual Time Calculation (min): 60 min  Short Term Goals: Week 2: SLP Short Term Goal 1 (Week 2): Patient will self-monitor and correct errors during functional tasks with Max A multimodal cues.  SLP Short Term Goal 2 (Week 2): Patient will visually scan to locate specific items during functional tasks with Mod A multimodal cues.  SLP Short Term Goal 3 (Week 2): Patient will demonstrate functional problem solving with functional and familair tasks with Max A multimodal cues.  SLP Short Term Goal 4 (Week 2): Patient will demonstrate selective attention to functional tasks for ~10 minutes with Min A verbal cues for redirection.  SLP Short Term Goal 5 (Week 2): Patient will utilize exernal aids for orientation to place and time with Max A multimodal cues.   Skilled Therapeutic Interventions: Skilled treatment session focused on cognitive goals. SLP facilitated session by providing Max-Total A verbal and visual cues for problem solving and visual scanning during a basic matching task from a field of 2. Patient also required Max A verbal cues to follow basic commands throughout task. However, patient demonstrated selective attention to task in a mildly distracting environment for ~30 minutes with Min A verbal cues for redirection. Patient independently requested to use the bathroom but required Max A verbal cues for problem solving with task. Patient left upright in wheelchair with quick release belt in place and chair alarm on. Continue with current plan of care.      Function:  Cognition Comprehension Comprehension assist level: Understands basic 50 - 74% of the time/ requires cueing 25 - 49% of the time  Expression   Expression assist level: Expresses basic 50 - 74% of the time/requires cueing 25 - 49% of  the time. Needs to repeat parts of sentences.  Social Interaction Social Interaction assist level: Interacts appropriately 50 - 74% of the time - May be physically or verbally inappropriate.  Problem Solving Problem solving assist level: Solves basic less than 25% of the time - needs direction nearly all the time or does not effectively solve problems and may need a restraint for safety  Memory Memory assist level: Recognizes or recalls less than 25% of the time/requires cueing greater than 75% of the time    Pain Pain Assessment Pain Score: 0-No pain  Therapy/Group: Individual Therapy  Patrcia Schnepp 12/19/2017, 3:22 PM

## 2017-12-19 NOTE — Progress Notes (Signed)
Physical Therapy Session Note  Patient Details  Name: Tammy Arnold MRN: [id number] Date of Birth: 1941/09/27  Today's Date: 12/19/2017 PT Individual Time: 9509-3267 PT Individual Time Calculation (min): 72 min   Short Term Goals: Week 2:  PT Short Term Goal 1 (Week 2): Pt will complete furniture transfer from couch with mod assist +1. PT Short Term Goal 2 (Week 2): Pt will negotiate curb without rails but physical assist to simulate home environment.  Skilled Therapeutic Interventions/Progress Updates:  Pt received in TIS w/c in room with husband present. Pt not oriented to date even when given a choice of 2 and requires a choice of 2 to correctly state location. Pt's husband reports he feels as though he could take pt home & care for her at this level. Educated husband on pt's inability to care for herself, as she was unable to perform peri hygiene multiple times while working with speech therapist. Pt's husband Gershon Mussel states he feels this is 2/2 vision issues. Therapist educated him on pt's limited cognition as she was unable to recall month (pt also unable to recall correct month from choice of 2 throughout session even after therapist oriented her) nor location without assistance and therapist educates him that while pt is limited by vision deficits she is more limited by impaired cognition. Tom states "while I don't want to disagree with you, I do disagree with you". Therapist asked Gershon Mussel to complete some hands on training during session but he reports need to go to work and exits session. In dayroom therapist provided pt with written list of therapists names upon pt request but pt with difficulty reading them. Gait training x 200 ft with steady assist with intermittent bouts of very close supervision with pt continuing to demonstrate forward trunk flexion, decreased step length BLE & and decreased gait speed. Pt utilized nu-step on level 1 x 6 minutes with all 4 extremities with task focusing on  attention to task and endurance training. Pt then performed mini squats & heel raises with BUE support and max multimodal cuing & demonstration for technique. Attempted to have pt perform standing hip abduction but pt unable to follow commands of various nature to complete task. Returned to room and pt consumed remainder of lunch. Pt required MAX cuing to remove lid covering pears as pt attempting to poke lid with potato chip when cued to remove it. Pt unable to open plastic spoon, then unable to properly manipulate it in her hand to consume ice cream. Pt frequently confusing spoon, ice cream and chips and attempting to drink ice cream at one point. At times pt visibly frustrated due to confusion and inability to complete simple tasks with therapist providing encouragement. At end of session pt left sitting in TIS w/c with QRB & chair alarm donned & in care of RN.   During session pt unable to follow one step commands and appears to have some R inattention.    Therapy Documentation Precautions:  Precautions Precautions: Fall Precaution Comments: impaired vision Restrictions Weight Bearing Restrictions: No   See Function Navigator for Current Functional Status.   Therapy/Group: Individual Therapy  Waunita Schooner 12/19/2017, 2:45 PM

## 2017-12-20 ENCOUNTER — Inpatient Hospital Stay (HOSPITAL_COMMUNITY): Payer: Medicare Other | Admitting: Physical Therapy

## 2017-12-20 ENCOUNTER — Inpatient Hospital Stay (HOSPITAL_COMMUNITY): Payer: Medicare Other | Admitting: Speech Pathology

## 2017-12-20 ENCOUNTER — Inpatient Hospital Stay (HOSPITAL_COMMUNITY): Payer: Medicare Other | Admitting: Occupational Therapy

## 2017-12-20 LAB — CBC
HEMATOCRIT: 34.5 % — AB (ref 36.0–46.0)
Hemoglobin: 10.8 g/dL — ABNORMAL LOW (ref 12.0–15.0)
MCH: 27.7 pg (ref 26.0–34.0)
MCHC: 31.3 g/dL (ref 30.0–36.0)
MCV: 88.5 fL (ref 78.0–100.0)
Platelets: 505 10*3/uL — ABNORMAL HIGH (ref 150–400)
RBC: 3.9 MIL/uL (ref 3.87–5.11)
RDW: 13.7 % (ref 11.5–15.5)
WBC: 6.8 10*3/uL (ref 4.0–10.5)

## 2017-12-20 NOTE — Progress Notes (Signed)
[facility]     PROGRESS NOTE    Subjective/Complaints: Pt seen lying in bed this AM.  She slept well overnight.  She requests to be left alone.   ROS: Limited due to cognitive/behavioral    Objective: Vital Signs: Blood pressure 115/74, pulse 70, temperature 97.7 F (36.5 C), temperature source Axillary, resp. rate 18, height 5\' 4"  (1.626 m), weight 53.1 kg (117 lb), SpO2 94 %. Vas [place] Lower Extremity Venous (dvt)  Result Date: 12/09/2017  Lower Venous Study Indication: Swelling. Examination Guidelines: A complete evaluation includes B-mode imaging, spectral doppler, color doppler, and power doppler as needed of all accessible portions of each vessel. Bilateral testing is considered an integral part of a complete examination. Limited examinations for reoccurring indications may be performed as noted.  Right Venous Findings: +---------+---------------+---------+-----------+----------+-------+          CompressibilityPhasicitySpontaneityPropertiesSummary +---------+---------------+---------+-----------+----------+-------+ CFV      Full           Yes      Yes                          +---------+---------------+---------+-----------+----------+-------+ FV Prox  Full                                                 +---------+---------------+---------+-----------+----------+-------+ FV Mid   Full                                                 +---------+---------------+---------+-----------+----------+-------+ FV DistalFull                                                 +---------+---------------+---------+-----------+----------+-------+ PFV      Full                                                 +---------+---------------+---------+-----------+----------+-------+ POP      Full           Yes      Yes                          +---------+---------------+---------+-----------+----------+-------+ PTV      Full                                                  +---------+---------------+---------+-----------+----------+-------+ PERO     Full                                                 +---------+---------------+---------+-----------+----------+-------+ GSV      Full                                                 +---------+---------------+---------+-----------+----------+-------+  SSV      Full                                                 +---------+---------------+---------+-----------+----------+-------+  Left Venous Findings: +---------+---------------+---------+-----------+----------+-------+          CompressibilityPhasicitySpontaneityPropertiesSummary +---------+---------------+---------+-----------+----------+-------+ CFV      Full           Yes      Yes                          +---------+---------------+---------+-----------+----------+-------+ FV Prox  Full                                                 +---------+---------------+---------+-----------+----------+-------+ FV Mid   Full                                                 +---------+---------------+---------+-----------+----------+-------+ FV DistalFull                                                 +---------+---------------+---------+-----------+----------+-------+ PFV      Full                                                 +---------+---------------+---------+-----------+----------+-------+ POP      Full           Yes      Yes                          +---------+---------------+---------+-----------+----------+-------+ PTV      Full                                                 +---------+---------------+---------+-----------+----------+-------+ PERO     Full                                                 +---------+---------------+---------+-----------+----------+-------+ GSV      Full                                                  +---------+---------------+---------+-----------+----------+-------+ SSV      Full                                                 +---------+---------------+---------+-----------+----------+-------+  Final Interpretation: Right: There is no evidence of deep vein thrombosis in the lower extremity.There is no evidence of superficial venous thrombosis. No cystic structure found in the popliteal fossa. Left: There is no evidence of deep vein thrombosis in the lower extremity.There is no evidence of superficial venous thrombosis. No cystic structure found in the popliteal fossa.  *See table(s) above for measurements and observations. Electronically signed by Deitra Mayo on 12/09/2017 at 3:22:49 PM.     Recent Labs    12/20/17 0743  WBC 6.8  HGB 10.8*  HCT 34.5*  PLT 505*   No results for input(s): NA, K, CL, GLUCOSE, BUN, CREATININE, CALCIUM in the last 72 hours.  Invalid input(s): CO CBG (last 3)  No results for input(s): GLUCAP in the last 72 hours.  Wt Readings from Last 3 Encounters:  12/19/17 53.1 kg (117 lb)  12/04/17 56.7 kg (125 lb)  07/18/15 56.7 kg (125 lb)    Physical Exam:  Nursing note and vitals reviewed. Constitutional: She appears well-developed and well-nourished. No distress.  HENT: Normocephalic and atraumatic.  Eyes: No discharge.  EOMI.  Cardiovascular: RRR. No JVD  Respiratory: Normal effort.  Clear. GI: Bowel sounds are normal. She exhibits no distension.  Musculoskeletal: No edema or tenderness in extremities  Neurological: Alert and oriented x1 Memory deficits.  Limited insight and awareness Motor: Bilateral upper extremities: 5/5 proximal to distal Left lower extremity: Hip flexion 4+/5, distally 5/5 Right lower extremity: Hip flexion 3+/5, knee extension 4/5, ankle dorsi/plantar flexion 4+/5 (pt not willing to allow exam today) Skin: Skin is warm and dry. She is not diaphoretic.  Psychiatric: Pleasant and  cooperative   Assessment/Plan: 1.  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 Physiatrist is providing close team supervision and 24 hour management of active medical problems listed below. Physiatrist and rehab team continue to assess barriers to discharge/monitor patient progress toward functional and medical goals.  Function:  Bathing Bathing position   Position: Wheelchair/chair at sink  Bathing parts Body parts bathed by patient: (UB only) Body parts bathed by helper: Right arm, Left arm, Chest, Abdomen  Bathing assist Assist Level: (total A)      Upper Body Dressing/Undressing Upper body dressing   What is the patient wearing?: Pull over shirt/dress     Pull over shirt/dress - Perfomed by patient: Thread/unthread left sleeve, Put head through opening, Pull shirt over trunk Pull over shirt/dress - Perfomed by helper: Thread/unthread right sleeve        Upper body assist Assist Level: Touching or steadying assistance(Pt > 75%)      Lower Body Dressing/Undressing Lower body dressing   What is the patient wearing?: Pants, Non-skid slipper socks       Pants- Performed by helper: Thread/unthread right pants leg, Thread/unthread left pants leg, Pull pants up/down   Non-skid slipper socks- Performed by helper: Don/doff right sock, Don/doff left sock                  Lower body assist Assist for lower body dressing: (dependent)      Toileting Toileting Toileting activity did not occur: No continent bowel/bladder event   Toileting steps completed by helper: Adjust clothing prior to toileting, Performs perineal hygiene, Adjust clothing after toileting Toileting Assistive Devices: Grab bar or rail  Toileting assist Assist level: (total assist)   Transfers Chair/bed transfer Chair/bed transfer activity did not occur: Safety/medical  concerns Chair/bed transfer method: Ambulatory Chair/bed transfer assist level: Touching or steadying assistance (Pt > 75%) Chair/bed transfer assistive device: Armrests     Locomotion Ambulation     Max distance: 200 ft Assist level: Touching or steadying assistance (Pt > 75%)   Wheelchair Wheelchair activity did not occur: Safety/medical concerns     Assist Level: Dependent (Pt equals 0%)(for transport)  Cognition Comprehension Comprehension assist level: Understands basic 50 - 74% of the time/ requires cueing 25 - 49% of the time  Expression Expression assist level: Expresses basic 50 - 74% of the time/requires cueing 25 - 49% of the time. Needs to repeat parts of sentences.  Social Interaction Social Interaction assist level: Interacts appropriately 50 - 74% of the time - May be physically or verbally inappropriate.  Problem Solving Problem solving assist level: Solves basic less than 25% of the time - needs direction nearly all the time or does not effectively solve problems and may need a restraint for safety  Memory Memory assist level: Recognizes or recalls less than 25% of the time/requires cueing greater than 75% of the time   Medical Problem List and Plan: 1.  Deficits with mobility, transfers, self-care, cognition secondary to Balint's syndrome.   Continue CIR.   2.  DVT Prophylaxis/Anticoagulation: Pharmaceutical: Lovenox    Dopplers negative 3. Severe low back pain/Pain Management: tylenol prn   Kpad, Lidoderm patch 4. Mood: Team to provide ego support. LCSW to follow with patient and husband for support.   5. Neuropsych: This patient is not capable of making decisions on her own behalf.   In low bed currently 6. Skin/Wound Care: routine pressure relief measures.  7. Fluids/Electrolytes/Nutrition: Monitor I/O. Offer supplements. Assist with meals due to visual deficits.   Encourage intake and track ins and outs closely.   BMP within acceptable range on 2/21 8. HTN:  Monitor BP bid    Relatively controlled on 2/26 9. Fever: Resolved   BC X 2 neg and final.  2/13 UCS with multispecies, chest x-ray reviewed and is negative   Dopplers negative 10.  Acute blood loss anemia   Hemoglobin 10.8 on 2/26     LOS (Days) 13 A FACE TO FACE EVALUATION WAS PERFORMED  Ruhaan Nordahl Lorie Phenix, MD 12/20/2017 9:11 AM

## 2017-12-20 NOTE — Progress Notes (Signed)
Physical Therapy Session Note  Patient Details  Name: Tammy Arnold MRN: [id number] Date of Birth: 06-Mar-1941  Today's Date: 12/20/2017 PT Individual Time: 0867-6195 PT Individual Time Calculation (min): 72 min   Short Term Goals: Week 2:  PT Short Term Goal 1 (Week 2): Pt will complete furniture transfer from couch with mod assist +1. PT Short Term Goal 2 (Week 2): Pt will negotiate curb without rails but physical assist to simulate home environment.  Skilled Therapeutic Interventions/Progress Updates:  Pt received in room with daughter present for session. No c/o pain, only stiffness 2/2 sitting for extended period of time. Transported pt outside via w/c total assist. Pt ambulated outdoors over various surfaces (concrete, brick) with steady assist; pt continues to demonstrate kyphotic posture, decreased step length BLE & decreased gait speed. Pt located half of items outdoors that therapist requested her find but pt unable to follow one step commands to walk between posts. Pt unable to recall birthday and other various information (names of family members) and required choice of 2 or 3. Back on unit pt negotiated curb & uneven surface with 1UE support, min assist and max cuing for safety. Pt ambulates throughout unit in same manner as noted above & completes transfer from low, compliant couch (and bench outside) with steady assist. Pt's daughter exited & pt's husband arrived for session. Tom reports he would like pt to d/c to SNF facility following d/c from CIR. At end of session pt left sitting in TIS w/c in room with QRB & chair alarm donned, telesitter & husband in room.    Therapy Documentation Precautions:  Precautions Precautions: Fall Precaution Comments: impaired vision Restrictions Weight Bearing Restrictions: No   See Function Navigator for Current Functional Status.   Therapy/Group: Individual Therapy  Waunita Schooner 12/20/2017, 4:14 PM

## 2017-12-20 NOTE — Progress Notes (Signed)
Occupational Therapy Session Note  Patient Details  Name: Tammy Arnold MRN: [id number] Date of Birth: 04-03-1941  Today's Date: 12/20/2017 OT Individual Time: 0900-1000 OT Individual Time Calculation (min): 60 min    Short Term Goals: Week 2:  OT Short Term Goal 1 (Week 2): Pt will maintain sustained attention to functional task for 4 minutes with mod multimodal cues OT Short Term Goal 2 (Week 2): Pt will complete 1 grooming task in standing to improve balance  OT Short Term Goal 3 (Week 2): Pt will complete UB dressing with Mod A during 2 ADL sessions   Skilled Therapeutic Interventions/Progress Updates:    Upon entering the room, pt supine in bed with husband present. Husband with concerns regarding pt progress and visual difficulties. OT attempting to explain and educate caregiver on pt's current progress and how pt is functionally participating. Education to continue. OT and caregiver also discussing discharge recommendations for possible SNF vs. 24/7 Supervision. Social worker notified as well. Pt performs supine >sit with min A to EOB with increased time and mod multimodal cues. Pt donning LB clothing items while seated on EOB with min A for balance and total A to thread and pull over B hips. Pt ambulating with min hand held assistance into bathroom and performed transfer onto toilet, clothing management and hygiene with min A for balance and mod multimodal cues and increased time to sequence and motor plan. Pt standing at sink for grooming tasks with max cues to sequence, initiate, and locate items to comb hair and brush teeth. Pt ambulating in hall 100' with min hand held assistance overall. Pt returning to sit in wheelchair with quick release belt donned and chair alarm activated. RN arrived with medications.   Therapy Documentation Precautions:  Precautions Precautions: Fall Precaution Comments: impaired vision Restrictions Weight Bearing Restrictions: No General:   Vital  Signs:   Pain: Pain Assessment Pain Assessment: No/denies pain Pain Intervention(s): Medication (See eMAR)(scheduled) ADL:   Vision   Perception    Praxis   Exercises:   Other Treatments:    See Function Navigator for Current Functional Status.   Therapy/Group: Individual Therapy  Gypsy Decant 12/20/2017, 12:59 PM

## 2017-12-20 NOTE — Progress Notes (Signed)
Speech Language Pathology Daily Session Note  Patient Details  Name: Tammy Arnold MRN: [id number] Date of Birth: 1941-09-15  Today's Date: 12/20/2017 SLP Individual Time: 1100-1200 SLP Individual Time Calculation (min): 60 min  Short Term Goals: Week 2: SLP Short Term Goal 1 (Week 2): Patient will self-monitor and correct errors during functional tasks with Max A multimodal cues.  SLP Short Term Goal 2 (Week 2): Patient will visually scan to locate specific items during functional tasks with Mod A multimodal cues.  SLP Short Term Goal 3 (Week 2): Patient will demonstrate functional problem solving with functional and familair tasks with Max A multimodal cues.  SLP Short Term Goal 4 (Week 2): Patient will demonstrate selective attention to functional tasks for ~10 minutes with Min A verbal cues for redirection.  SLP Short Term Goal 5 (Week 2): Patient will utilize exernal aids for orientation to place and time with Max A multimodal cues.   Skilled Therapeutic Interventions: Skilled treatment session focused on cognitive goals. SLP facilitated session by providing extra time and Mod-Max A verbal cues for recall and problem solving in regards to visual scanning during a basic task that focused on recall of biographical information. Patient performed task while standing at the table for ~15 minutes with close supervision. Had extensive conversation with patient's daughter that focused on patient's current cognitive function and level of assistance needed at this time in attempts to help facilitate d/c planning per her request. Patient left upright in wheelchair with quick release belt in place, alarm on and visitors/family present. Continue with current plan of care.      Function:   Cognition Comprehension Comprehension assist level: Understands basic 50 - 74% of the time/ requires cueing 25 - 49% of the time  Expression   Expression assist level: Expresses basic 50 - 74% of the time/requires  cueing 25 - 49% of the time. Needs to repeat parts of sentences.  Social Interaction Social Interaction assist level: Interacts appropriately 50 - 74% of the time - May be physically or verbally inappropriate.  Problem Solving Problem solving assist level: Solves basic less than 25% of the time - needs direction nearly all the time or does not effectively solve problems and may need a restraint for safety  Memory Memory assist level: Recognizes or recalls less than 25% of the time/requires cueing greater than 75% of the time    Pain Pain Assessment Pain Assessment: No/denies pain Pain Intervention(s): Medication (See eMAR)(scheduled)  Therapy/Group: Individual Therapy  Deeandra Jerry 12/20/2017, 12:50 PM

## 2017-12-21 ENCOUNTER — Inpatient Hospital Stay (HOSPITAL_COMMUNITY): Payer: Medicare Other | Admitting: Physical Therapy

## 2017-12-21 ENCOUNTER — Inpatient Hospital Stay (HOSPITAL_COMMUNITY): Payer: Medicare Other | Admitting: Occupational Therapy

## 2017-12-21 ENCOUNTER — Encounter (HOSPITAL_COMMUNITY): Payer: Medicare Other | Admitting: Psychology

## 2017-12-21 ENCOUNTER — Inpatient Hospital Stay (HOSPITAL_COMMUNITY): Payer: Medicare Other | Admitting: Speech Pathology

## 2017-12-21 DIAGNOSIS — R159 Full incontinence of feces: Secondary | ICD-10-CM

## 2017-12-21 DIAGNOSIS — R32 Unspecified urinary incontinence: Secondary | ICD-10-CM | POA: Insufficient documentation

## 2017-12-21 DIAGNOSIS — I679 Cerebrovascular disease, unspecified: Secondary | ICD-10-CM

## 2017-12-21 DIAGNOSIS — N39498 Other specified urinary incontinence: Secondary | ICD-10-CM

## 2017-12-21 MED ORDER — VITAMIN B-12 1000 MCG PO TABS
500.0000 ug | ORAL_TABLET | Freq: Every day | ORAL | Status: DC
  Administered 2017-12-22 – 2017-12-27 (×6): 500 ug via ORAL
  Filled 2017-12-21 (×6): qty 1

## 2017-12-21 NOTE — Patient Care Conference (Signed)
Inpatient RehabilitationTeam Conference and Plan of Care Update Date: 12/20/2017   Time: 10:35 AM    Patient Name: Tammy Arnold      Medical Record Number: [id number]  Date of Birth: 1941/06/16 Sex: Female         Room/Bed: 4W08C/4W08C-01 Payor Info: Payor: MEDICARE / Plan: MEDICARE PART A AND B / Product Type: *No Product type* /    Admitting Diagnosis: Balance Syn  Admit Date/Time:  12/07/2017  5:22 PM Admission Comments: No comment available   Primary Diagnosis:  Balint's syndrome Principal Problem: Balint's syndrome  Patient Active Problem List   Diagnosis Date Noted  . Absence of bladder continence   . Full incontinence of feces   . Small vessel disease, cerebrovascular   . 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 FUO (fever of unknown origin)   . Ataxia 12/05/2017  . Cognitive impairment   . Abnormal MRI   . Blurry vision   . Hypokalemia   . Acute blood loss anemia   . TIA (transient ischemic attack) 12/04/2017  . Essential hypertension 12/04/2017  . HLD (hyperlipidemia) 12/04/2017    Expected Discharge Date: Expected Discharge Date: (likely SNF)  Team Members Present: Physician leading conference: Dr. Delice Arnold Social Worker Present: Tammy Pall, LCSW Nurse Present: Tammy Karvonen, RN PT Present: Tammy Arnold, PT OT Present: Tammy Arnold, OT SLP Present: Tammy Arnold, SLP     Current Status/Progress Goal Weekly Team Focus  Medical   Deficits with mobility, transfers, self-care, cognition secondary to Balint's syndrome  Improve cognition compensation and apraxia  See above   Bowel/Bladder   remains cont of bladder w frequent toileting, often inc of bowel,   continue to be cont urine, increace continence of bowel  w effective toileting schedule, call appropriately to toilet  establish an effective toileting schedule, encourage call light usage or verbalizing need to eliminate   Swallow/Nutrition/ Hydration             ADL's   No bathing completed this week  during OT, Total A UB/LB dressing, Min-Mod A bathroom transfers. Severely apraxic and cognitive impaired   Min A-supervision overall, will most likely be downgraded   pt/family education, ADL retraining, cognitive remediation, functional transfers    Mobility   min assist overall, MAX multimodal cuing 2/2 impaired vision & cognition  min assist overall  pt/family education, d/c planning, cognitive remediation, gait, transfers, stairs, strengthening, activity tolerance   Communication             Safety/Cognition/ Behavioral Observations  Max-Total A  Mod-Max A  attention, problem solving, recall use of straegies    Pain   denies pain  remain Pain < 2  asses pain Q shift and PRN, provide prescribed analgesic PRN    Skin                *See Care Plan and progress notes for long and short-term goals.     Barriers to Discharge  Current Status/Progress Possible Resolutions Date Resolved   Physician    Behavior     See above  Therapies      Nursing                  PT     family needs significant caregiver training as pt requires MAX mulitmodal cuing for success with functional mobility 2/2 vision & cognitive impairments              OT Incontinence;Behavior  SLP                SW                Discharge Planning/Teaching Needs:  Plan to return home with spouse who will need to arrange for 24/7 assistance vs possible SNF  Teaching to be completed with spouse and any other caregivers if plan is for home d/c.   Team Discussion:  No significant med concerns.  Still fluctuates with therapies ranging min - total assistance.  Primary issues continue are cognition/ awareness and apraxia.  Husband continues to deny any significant cognitive issues.  Making some progress with mobility.  Still incont b/b.  Spouse has made comments to staff about possible change of plan to SNF.  Revisions to Treatment Plan:  Possible change of d/c plan to SNF.    Continued Need for Acute  Rehabilitation Level of Care: The patient requires daily medical management by a physician with specialized training in physical medicine and rehabilitation for the following conditions: Daily direction of a multidisciplinary physical rehabilitation program to ensure safe treatment while eliciting the highest outcome that is of practical value to the patient.: Yes Daily medical management of patient stability for increased activity during participation in an intensive rehabilitation regime.: Yes Daily analysis of laboratory values and/or radiology reports with any subsequent need for medication adjustment of medical intervention for : Neurological problems;Mood/behavior problems  Tammy Arnold 12/21/2017, 2:35 PM

## 2017-12-21 NOTE — Progress Notes (Signed)
Occupational Therapy Session Note  Patient Details  Name: Tammy Arnold MRN: [id number] Date of Birth: 09/14/1941  Today's Date: 12/21/2017 OT Individual Time: 0830-0900 OT Individual Time Calculation (min): 30 min    Short Term Goals: Week 1:  OT Short Term Goal 1 (Week 1): Pt will scan to items to locate items needed for self care with mod multimodal cues.  OT Short Term Goal 1 - Progress (Week 1): Not progressing OT Short Term Goal 2 (Week 1): Pt will perform toileting with max A to decrease level of assist with self care. OT Short Term Goal 2 - Progress (Week 1): Not progressing OT Short Term Goal 3 (Week 1): Pt will perform toilet transfer with mod A for 3 consecutive sessions. OT Short Term Goal 3 - Progress (Week 1): Not progressing Week 2:  OT Short Term Goal 1 (Week 2): Pt will maintain sustained attention to functional task for 4 minutes with mod multimodal cues OT Short Term Goal 2 (Week 2): Pt will complete 1 grooming task in standing to improve balance  OT Short Term Goal 3 (Week 2): Pt will complete UB dressing with Mod A during 2 ADL sessions       Skilled Therapeutic Interventions/Progress Updates:    Pt seen for a short session to work on motor planning and processing skills in the context of dressing.  Pt received in bed and sat to EOB with min A to guide her movement patterns. Pt stood and transferred to chair with min A to guide her wt shift.  From w/c worked on dressing using hand over hand guiding and tactile cues.  For example, therapist placed pt's hand on head opening and initiated movement to place over head and then pt followed through. Followed these steps with the arms. Pt then pulled shirt down with a verbal cue.  With pants, same technique of hand over hand to initiate movement and then used questioning cues.  "can you see your feet?  Keep pulling pants so you can see your feet"   She then stood with a cue and pulled pants over hips 75% of the way before  needing the cue " is your bottom fully covered?"  She needed a tactile cue and then pulled her pants up.   Pt sat in regular w/c at sink for grooming. Toothbrush set up for pt and handed to her then she brushed teeth with her R hand.  Cued pt rinse and spit and handed pt a water bottle as she did not have a cup to use. Pt did not recognize the bottle.  Brought it to her lips and then she used it.  Pt handed comb and she combed front and sides of hair.    Discussed with pt why questioning cues used so she can increase her awareness and anticipate the next steps of the task.    Pt resting in w/c with all needs met with chair alarm and quick release belt.    Therapy Documentation Precautions:  Precautions Precautions: Fall Precaution Comments: impaired vision Restrictions Weight Bearing Restrictions: No  Pain: Pain Assessment Pain Assessment: No/denies pain   ADL:     See Function Navigator for Current Functional Status.   Therapy/Group: Individual Therapy  [place] 12/21/2017, 10:41 AM

## 2017-12-21 NOTE — Progress Notes (Signed)
Occupational Therapy Session Note  Patient Details  Name: Tammy Arnold MRN: [id number] Date of Birth: [birth year]  Today's Date: 12/21/2017 OT Individual Time: 3267-1245 OT Individual Time Calculation (min): 55 min    Short Term Goals: Week 2:  OT Short Term Goal 1 (Week 2): Pt will maintain sustained attention to functional task for 4 minutes with mod multimodal cues OT Short Term Goal 2 (Week 2): Pt will complete 1 grooming task in standing to improve balance  OT Short Term Goal 3 (Week 2): Pt will complete UB dressing with Mod A during 2 ADL sessions   Skilled Therapeutic Interventions/Progress Updates:   Pt transitioned easily from OT session but reports fatigue. Pt participating in matching card game and able to pick two of the same cards from 4 choices with mod verbal guidance cues. Pt becoming very restless and begins pacing up and down hallway with steady assistance for balance. Pt looking for family members that are not present and requiring max multimodal cues for redirection. Pt ambulating to bathroom with steady assistance for transfer and able to void urine. Pt able to perform clothing management and hygiene with increased time and cuing. Pt returning to sit in wheelchair at end of session with quick release belt donned. Neil diamond playing in order to calm pt upon exiting. Call bell within reach.   Therapy Documentation Precautions:  Precautions Precautions: Fall Precaution Comments: impaired vision Restrictions Weight Bearing Restrictions: No General:   Vital Signs: Therapy Vitals Temp: 98.5 F (36.9 C) Temp Source: Oral Pulse Rate: 81 BP: (!) 163/74 Patient Position (if appropriate): Sitting Oxygen Therapy SpO2: 100 % O2 Device: Room Air  See Function Navigator for Current Functional Status.   Therapy/Group: Individual Therapy  Gypsy Decant 12/21/2017, 5:13 PM

## 2017-12-21 NOTE — Progress Notes (Signed)
Social Work Patient ID: Tammy Arnold, female   DOB: 1941-06-22, 77 y.o.   MRN: [id number]   Met with pt and spouse yesterday to review team conference.  Pt sitting up in w/c and appears to listen, however, makes no comments.  Discussed team report of fluctuating function overall and limited gains this week.  Spouse states, "well, I completely disagree with this but that doesn't matter."  Spouse continues to note that her "ocular ataxia" is the primary cause for her difficulties.  Allowed spouse to ventilate his disappointment in our program as he states, "I thought this was supposed to be an intensive rehab.  It's not. I thought there would be 6-8 hours a day of therapy.  Maybe a skilled nursing center would be better where she can keep getting therapy."  I discussed this option of SNF and spouse confirms this is what he would prefer at this point and attempts to explain to pt the reason for needing SNF.  Will begin SNF bed search.  Kwali Wrinkle, LCSW

## 2017-12-21 NOTE — Consult Note (Signed)
Neuropsychological Consultation   Patient:   Tammy Arnold   DOB:   04-19-1941  MR Number:  [id number]  Location:  [facility] [id number] [place] [address] Dept: [facility]: [phone]           Date of Service:   12/21/2017  Start Time:   9 AM End Time:   10 AM  Provider/Observer:  Ilean Skill, Psy.D.       Clinical Neuropsychologist       Billing Code/Service: [phone] 4 Units  Chief Complaint:    Tammy Arnold is a 77 year old female with history of HTN and progressive cognitive decline over past 2-3 years.  Presented on 12/04/2017 after fall and difficulty talking.  CT suggested Small vessel disease in the MCA branch bilaterally.  MRI showing generalized atrophy and volume loss.  Dr. Erlinda Hong identified Vander, Oculomotor apraxia, and Optic ataxia, which are likely related to the bilateral nature of her MCA focused SVD.  The patient has also displayed issues with executive function, motor function, and memory function.  Reason for Service:  Delano Scardino was referred for neuropsychological consultation due to concerns about the degree of her neuropsychological deficits and concerns regarding how the will impact therapeutic interventions.  Below is the HPI for the current admission.    HPI: Tammy Arnold a 77 y.o.femalewith history of HTN, progressive cognitive decline over 2-3 years,  blurry vision -- recent cataract surgery left eye.History taken from chart review. She was admitted on 2/10/19withfall and difficulty talking. CThead was suggestive of cerebellar abnormality. CTAhead/neck showed atherosclerotic changes with distal small vessel disease and remote left occipital lobe infarct --no perfusion deficits. MRI brain reviewed, showing generalized atrophy. Per report,generalized volume loss with ex vacuo dilation of ventricles and sequelae of chronic ischemic  microangiopathy.  She continued to have issues with confusion, agitation and visual disturbance.  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 EEG done revealing moderate generalized slowing question due to dementia, toxic/metabolic encephalopathy or infectious etiology.   Current Status:  While the patients simultanagnosias (visual attention disorder with missing parts and inattention of visual gestalt), oculomotor apraxia, and optic ataxia (guiding hand to target area using visual information) are some of the primary symptoms leading to this hospitalization after a fall, the patient is having other significant neuropsychological deficits.  Mental Status is severely impaired for memory recall, visuo-constructional abilities, orientation to place, time and situation.  Her primary expressive language is intact but the patient avoids depth to conversations to compensate and adjust to the memory and executive function deficits she has.     Behavioral Observation: [name]  presents as a 77 y.o.-year-old Right Caucasian Female who appeared her stated age. her dress was Appropriate and she was Well Groomed and her manners were Appropriate to the situation.  her participation was indicative of Appropriate and Redirectable behaviors.  There were any physical disabilities noted.  she displayed an appropriate level of cooperation and motivation.     Interactions:    Active Appropriate, Inattentive and Redirectable  Attention:   abnormal and attention span appeared shorter than expected for age  Memory:   abnormal; global memory impairment noted especially with free recall.  Visuo-spatial:  abnormal  Speech (Volume):  low  Speech:   normal; normal  Thought Process:  Coherent, Circumstantial and Tangential  Though  Content:  WNL; not suicidal and not homicidal  Orientation:   person and  not situation, time or place.  Judgment:   Poor  Planning:   Poor  Affect:    Anxious  Mood:    Anxious  Insight:   Lacking  Intelligence:   normal  Medical History:   Past Medical History:  Diagnosis Date  . Arthritis   . High cholesterol   . Hypertension     Psychiatric History:  No prior psychiatric history  Family Med/Psych History:  Family History  Problem Relation Age of Onset  . Dementia Mother     Risk of Suicide/Violence: low Patient denies SI or HI  Impression/DX:  Tammy Arnold is a 77 year old female with history of HTN and progressive cognitive decline over past 2-3 years.  Presented on 12/04/2017 after fall and difficulty talking.  CT suggested Small vessel disease in the MCA branch bilaterally.  MRI showing generalized atrophy and volume loss.  Dr. Erlinda Hong identified [place], Oculomotor apraxia, and Optic ataxia, which are likely related to the bilateral nature of her MCA focused SVD.  The patient has also displayed issues with executive function, motor function, and memory function.  While the patients simultanagnosias (visual attention disorder with missing parts and inattention of visual gestalt), oculomotor apraxia, and optic ataxia (guiding hand to target area using visual information) are some of the primary symptoms leading to this hospitalization after a fall, the patient is having other significant neuropsychological deficits.  Mental Status is severely impaired for memory recall, visuo-constructional abilities, orientation to place, time and situation.  Her primary expressive language is intact but the patient avoids depth to conversations to compensate and adjust to the memory and executive function deficits she has.     Diagnosis:    Chronic Small Vessel disease with focus on MCA branch and generalized volume loss.  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  Remote Occipital lobe  infarct        Electronically Signed   _______________________ Ilean Skill, Psy.D.

## 2017-12-21 NOTE — Progress Notes (Signed)
PHYSICAL MEDICINE & REHABILITATION     PROGRESS NOTE    Subjective/Complaints: Patient seen lying in bed this morning. She states she slept well overnight. No reported issues. Later notified of an continent bowel episode.  ROS: Ltd. due to cognitive/behavioral    Objective: Vital Signs: Blood pressure (!) 121/55, pulse 64, temperature 98.1 F (36.7 C), temperature source Oral, resp. rate 17, height 5\' 4"  (1.626 m), weight 52.7 kg (116 lb 3.2 oz), SpO2 100 %. Vas [place] Lower Extremity Venous (dvt)  Result Date: 12/09/2017  Lower Venous Study Indication: Swelling. Examination Guidelines: A complete evaluation includes B-mode imaging, spectral doppler, color doppler, and power doppler as needed of all accessible portions of each vessel. Bilateral testing is considered an integral part of a complete examination. Limited examinations for reoccurring indications may be performed as noted.  Right Venous Findings: +---------+---------------+---------+-----------+----------+-------+          CompressibilityPhasicitySpontaneityPropertiesSummary +---------+---------------+---------+-----------+----------+-------+ CFV      Full           Yes      Yes                          +---------+---------------+---------+-----------+----------+-------+ FV Prox  Full                                                 +---------+---------------+---------+-----------+----------+-------+ FV Mid   Full                                                 +---------+---------------+---------+-----------+----------+-------+ FV DistalFull                                                 +---------+---------------+---------+-----------+----------+-------+ PFV      Full                                                 +---------+---------------+---------+-----------+----------+-------+ POP      Full           Yes      Yes                           +---------+---------------+---------+-----------+----------+-------+ PTV      Full                                                 +---------+---------------+---------+-----------+----------+-------+ PERO     Full                                                 +---------+---------------+---------+-----------+----------+-------+ GSV      Full                                                 +---------+---------------+---------+-----------+----------+-------+  SSV      Full                                                 +---------+---------------+---------+-----------+----------+-------+  Left Venous Findings: +---------+---------------+---------+-----------+----------+-------+          CompressibilityPhasicitySpontaneityPropertiesSummary +---------+---------------+---------+-----------+----------+-------+ CFV      Full           Yes      Yes                          +---------+---------------+---------+-----------+----------+-------+ FV Prox  Full                                                 +---------+---------------+---------+-----------+----------+-------+ FV Mid   Full                                                 +---------+---------------+---------+-----------+----------+-------+ FV DistalFull                                                 +---------+---------------+---------+-----------+----------+-------+ PFV      Full                                                 +---------+---------------+---------+-----------+----------+-------+ POP      Full           Yes      Yes                          +---------+---------------+---------+-----------+----------+-------+ PTV      Full                                                 +---------+---------------+---------+-----------+----------+-------+ PERO     Full                                                  +---------+---------------+---------+-----------+----------+-------+ GSV      Full                                                 +---------+---------------+---------+-----------+----------+-------+ SSV      Full                                                 +---------+---------------+---------+-----------+----------+-------+  Final Interpretation: Right: There is no evidence of deep vein thrombosis in the lower extremity.There is no evidence of superficial venous thrombosis. No cystic structure found in the popliteal fossa. Left: There is no evidence of deep vein thrombosis in the lower extremity.There is no evidence of superficial venous thrombosis. No cystic structure found in the popliteal fossa.  *See table(s) above for measurements and observations. Electronically signed by Deitra Mayo on 12/09/2017 at 3:22:49 PM.     Recent Labs    12/20/17 0743  WBC 6.8  HGB 10.8*  HCT 34.5*  PLT 505*   No results for input(s): NA, K, CL, GLUCOSE, BUN, CREATININE, CALCIUM in the last 72 hours.  Invalid input(s): CO CBG (last 3)  No results for input(s): GLUCAP in the last 72 hours.  Wt Readings from Last 3 Encounters:  12/21/17 52.7 kg (116 lb 3.2 oz)  12/04/17 56.7 kg (125 lb)  07/18/15 56.7 kg (125 lb)    Physical Exam:  Nursing note and vitals reviewed. Constitutional: She appears well-developed and well-nourished. No distress.  HENT: Normocephalic and atraumatic.  Eyes: No discharge.  EOMI.  Cardiovascular: RRR. No JVD  Respiratory: Normal effort.  Clear. GI: Bowel sounds are normal. She exhibits no distension.  Musculoskeletal: No edema or tenderness in extremities  Neurological: Alert. Memory deficits.  Limited insight and awareness Motor: Bilateral upper extremities: 5/5 proximal to distal Left lower extremity: Hip flexion 4-4+/5, distally 5/5 Right lower extremity: Hip flexion 3+-4-/5, knee extension 4+/5, ankle dorsi/plantar flexion 4+/5  Skin: Skin is  warm and dry. She is not diaphoretic.  Psychiatric: Pleasant and cooperative   Assessment/Plan: 1.  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 Physiatrist is providing close team supervision and 24 hour management of active medical problems listed below. Physiatrist and rehab team continue to assess barriers to discharge/monitor patient progress toward functional and medical goals.  Function:  Bathing Bathing position   Position: Wheelchair/chair at sink  Bathing parts Body parts bathed by patient: (UB only) Body parts bathed by helper: Right arm, Left arm, Chest, Abdomen  Bathing assist Assist Level: (total A)      Upper Body Dressing/Undressing Upper body dressing   What is the patient wearing?: Pull over shirt/dress     Pull over shirt/dress - Perfomed by patient: Thread/unthread left sleeve, Put head through opening, Pull shirt over trunk Pull over shirt/dress - Perfomed by helper: Thread/unthread right sleeve        Upper body assist Assist Level: Touching or steadying assistance(Pt > 75%)      Lower Body Dressing/Undressing Lower body dressing   What is the patient wearing?: Pants       Pants- Performed by helper: Thread/unthread right pants leg, Thread/unthread left pants leg, Pull pants up/down   Non-skid slipper socks- Performed by helper: Don/doff right sock, Don/doff left sock                  Lower body assist Assist for lower body dressing: Touching or steadying assistance (Pt > 75%)      Toileting Toileting Toileting activity did not occur: No continent bowel/bladder event Toileting steps completed by patient: Adjust clothing prior to toileting, Performs perineal hygiene, Adjust clothing after toileting Toileting steps completed by helper: Adjust clothing prior to toileting, Performs perineal hygiene, Adjust clothing after  toileting Toileting Assistive Devices: Grab bar or rail  Toileting assist Assist level: Touching or steadying  assistance (Pt.75%)   Transfers Chair/bed transfer Chair/bed transfer activity did not occur: Safety/medical concerns Chair/bed transfer method: Ambulatory Chair/bed transfer assist level: Touching or steadying assistance (Pt > 75%) Chair/bed transfer assistive device: Armrests     Locomotion Ambulation     Max distance: 150 ft Assist level: Touching or steadying assistance (Pt > 75%)   Wheelchair Wheelchair activity did not occur: Safety/medical concerns     Assist Level: Dependent (Pt equals 0%)(for transport)  Cognition Comprehension Comprehension assist level: Understands basic 50 - 74% of the time/ requires cueing 25 - 49% of the time  Expression Expression assist level: Expresses basic 50 - 74% of the time/requires cueing 25 - 49% of the time. Needs to repeat parts of sentences.  Social Interaction Social Interaction assist level: Interacts appropriately 50 - 74% of the time - May be physically or verbally inappropriate.  Problem Solving Problem solving assist level: Solves basic less than 25% of the time - needs direction nearly all the time or does not effectively solve problems and may need a restraint for safety  Memory Memory assist level: Recognizes or recalls less than 25% of the time/requires cueing greater than 75% of the time   Medical Problem List and Plan: 1.  Deficits with mobility, transfers, self-care, cognition secondary to Balint's syndrome.   Continue CIR.   2.  DVT Prophylaxis/Anticoagulation: Pharmaceutical: Lovenox    Dopplers negative 3. Severe low back pain/Pain Management: tylenol prn   Kpad, Lidoderm patch 4. Mood: Team to provide ego support. LCSW to follow with patient and husband for support.   5. Neuropsych: This patient is not capable of making decisions on her own behalf.   In low bed currently 6. Skin/Wound Care: routine pressure  relief measures.  7. Fluids/Electrolytes/Nutrition: Monitor I/O. Offer supplements. Assist with meals due to visual deficits.   Encourage intake and track ins and outs closely.   BMP within acceptable range on 2/21   Labs ordered for tomorrow 8. HTN: Monitor BP bid    Relatively controlled on 2/27 9. Fever: Resolved   BC X 2 neg and final.  2/13 UCS with multispecies, chest x-ray reviewed and is negative   Dopplers negative 10.  Acute blood loss anemia   Hemoglobin 10.8 on 2/26    11. Urinary incontinence   Recent PVRs negative per nursing    Urecholine DC'd on 2/27  LOS (Days) 14 A FACE TO FACE EVALUATION WAS PERFORMED  Dayzee Trower Lorie Phenix, MD 12/21/2017 8:57 AM

## 2017-12-21 NOTE — Progress Notes (Signed)
Speech Language Pathology Daily Session Note  Patient Details  Name: Tammy Arnold MRN: [id number] Date of Birth: [birth year]  Today's Date: 12/21/2017 SLP Individual Time: 1000-1100 SLP Individual Time Calculation (min): 60 min  Short Term Goals: Week 2: SLP Short Term Goal 1 (Week 2): Patient will self-monitor and correct errors during functional tasks with Max A multimodal cues.  SLP Short Term Goal 2 (Week 2): Patient will visually scan to locate specific items during functional tasks with Mod A multimodal cues.  SLP Short Term Goal 3 (Week 2): Patient will demonstrate functional problem solving with functional and familair tasks with Max A multimodal cues.  SLP Short Term Goal 4 (Week 2): Patient will demonstrate selective attention to functional tasks for ~10 minutes with Min A verbal cues for redirection.  SLP Short Term Goal 5 (Week 2): Patient will utilize exernal aids for orientation to place and time with Max A multimodal cues.   Skilled Therapeutic Interventions: Skilled treatment session focused on cognitive goals. SLP facilitated session by providing total A for utilization of external aid to recall 3 items after a 5 minute delay. Patient also located items in the store with Max-Total A multimodal cues for problem solving and visual scanning. Patient left upright in wheelchair with alarm on and quick release belt in place. Continue with current plan of care.      Function:   Cognition Comprehension Comprehension assist level: Understands basic 50 - 74% of the time/ requires cueing 25 - 49% of the time  Expression   Expression assist level: Expresses basic 50 - 74% of the time/requires cueing 25 - 49% of the time. Needs to repeat parts of sentences.  Social Interaction Social Interaction assist level: Interacts appropriately 50 - 74% of the time - May be physically or verbally inappropriate.  Problem Solving Problem solving assist level: Solves basic less than 25% of the time  - needs direction nearly all the time or does not effectively solve problems and may need a restraint for safety  Memory Memory assist level: Recognizes or recalls 25 - 49% of the time/requires cueing 50 - 75% of the time    Pain Pain Assessment Pain Assessment: No/denies pain Pain Score: 0-No pain  Therapy/Group: Individual Therapy  Normal Recinos 12/21/2017, 1:13 PM

## 2017-12-21 NOTE — Progress Notes (Signed)
Physical Therapy Session Note  Patient Details  Name: Tammy Arnold MRN: [id number] Date of Birth: December 25, 1940  Today's Date: 12/21/2017 PT Individual Time: 1405-1502 PT Individual Time Calculation (min): 57 min   Short Term Goals: Week 2:  PT Short Term Goal 1 (Week 2): Pt will complete furniture transfer from couch with mod assist +1. PT Short Term Goal 2 (Week 2): Pt will negotiate curb without rails but physical assist to simulate home environment.  Skilled Therapeutic Interventions/Progress Updates:  Pt received in w/c & agreeable to tx. Session focused on functional mobility & cognitive remediation. In apartment pt completed bed mobility with supervision and transfer from rocking recliner with steady assist. Pt able to remove pillowcases from pillows but required max assist to don new pillowcase. Pt required max cuing to make up bed 2/2 impaired problem solving. Pt ambulates throughout unit without AD & steady assist. Pt completed car transfer with steady assist but was unable to open car door. Pt utilized nu-step on level 1 x 10 minutes with all four extremities with task focusing on endurance training. At end of session pt left in handoff to OT. Pt noted slight HA at end of session.   During session pt initially unable to recall birthday & stated she was in her 15s; pt also stated her children were 98, 82 and 59 y/o. Therapist provided total assist for orientation.   Therapy Documentation Precautions:  Precautions Precautions: Fall Precaution Comments: impaired vision Restrictions Weight Bearing Restrictions: No   See Function Navigator for Current Functional Status.   Therapy/Group: Individual Therapy  Waunita Schooner 12/21/2017, 3:05 PM

## 2017-12-21 NOTE — Progress Notes (Signed)
Social Work Patient ID: Corky Sox, female   DOB: 12-21-1940, 77 y.o.   MRN: [id number]    Rexene Alberts  Social Worker  General Practice  Patient Care Conference  Signed  Date of Service:  12/21/2017  2:35 PM          Signed          [] Hide copied text  [] Hover for details   Inpatient RehabilitationTeam Conference and Plan of Care Update Date: 12/20/2017   Time: 10:35 AM      Patient Name: Tammy Arnold      Medical Record Number: [id number]  Date of Birth: November 26, 1940 Sex: Female         Room/Bed: 4W08C/4W08C-01 Payor Info: Payor: MEDICARE / Plan: MEDICARE PART A AND B / Product Type: *No Product type* /     Admitting Diagnosis: Balance Syn  Admit Date/Time:  12/07/2017  5:22 PM Admission Comments: No comment available    Primary Diagnosis:  Balint's syndrome Principal Problem: Balint's syndrome       Patient Active Problem List    Diagnosis Date Noted  . Absence of bladder continence    . Full incontinence of feces    . Small vessel disease, cerebrovascular    . 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 FUO (fever of unknown origin)    . Ataxia 12/05/2017  . Cognitive impairment    . Abnormal MRI    . Blurry vision    . Hypokalemia    . Acute blood loss anemia    . TIA (transient ischemic attack) 12/04/2017  . Essential hypertension 12/04/2017  . HLD (hyperlipidemia) 12/04/2017      Expected Discharge Date: Expected Discharge Date: (likely SNF)   Team Members Present: Physician leading conference: Dr. Delice Lesch Social Worker Present: Lennart Pall, LCSW Nurse Present: Benjie Karvonen, RN PT Present: Lavone Nian, PT OT Present: Benay Pillow, OT SLP Present: Weston Anna, SLP       Current Status/Progress Goal Weekly Team Focus  Medical     Deficits with mobility, transfers, self-care, cognition secondary to Balint's syndrome  Improve cognition compensation and apraxia  See above   Bowel/Bladder     remains cont of bladder w frequent  toileting, often inc of bowel,   continue to be cont urine, increace continence of bowel  w effective toileting schedule, call appropriately to toilet  establish an effective toileting schedule, encourage call light usage or verbalizing need to eliminate   Swallow/Nutrition/ Hydration               ADL's     No bathing completed this week during OT, Total A UB/LB dressing, Min-Mod A bathroom transfers. Severely apraxic and cognitive impaired   Min A-supervision overall, will most likely be downgraded   pt/family education, ADL retraining, cognitive remediation, functional transfers    Mobility     min assist overall, MAX multimodal cuing 2/2 impaired vision & cognition  min assist overall  pt/family education, d/c planning, cognitive remediation, gait, transfers, stairs, strengthening, activity tolerance   Communication               Safety/Cognition/ Behavioral Observations   Max-Total A  Mod-Max A  attention, problem solving, recall use of straegies    Pain     denies pain  remain Pain < 2  asses pain Q shift and PRN, provide prescribed analgesic PRN    Skin                 *  See Care Plan and progress notes for long and short-term goals.      Barriers to Discharge   Current Status/Progress Possible Resolutions Date Resolved   Physician     Behavior     See above  Therapies      Nursing                 PT     family needs significant caregiver training as pt requires MAX mulitmodal cuing for success with functional mobility 2/2 vision & cognitive impairments              OT Incontinence;Behavior               SLP            SW              Discharge Planning/Teaching Needs:  Plan to return home with spouse who will need to arrange for 24/7 assistance vs possible SNF  Teaching to be completed with spouse and any other caregivers if plan is for home d/c.   Team Discussion:  No significant med concerns.  Still fluctuates with therapies ranging min - total assistance.   Primary issues continue are cognition/ awareness and apraxia.  Husband continues to deny any significant cognitive issues.  Making some progress with mobility.  Still incont b/b.  Spouse has made comments to staff about possible change of plan to SNF.  Revisions to Treatment Plan:  Possible change of d/c plan to SNF.    Continued Need for Acute Rehabilitation Level of Care: The patient requires daily medical management by a physician with specialized training in physical medicine and rehabilitation for the following conditions: Daily direction of a multidisciplinary physical rehabilitation program to ensure safe treatment while eliciting the highest outcome that is of practical value to the patient.: Yes Daily medical management of patient stability for increased activity during participation in an intensive rehabilitation regime.: Yes Daily analysis of laboratory values and/or radiology reports with any subsequent need for medication adjustment of medical intervention for : Neurological problems;Mood/behavior problems   Ambert Virrueta 12/21/2017, 2:35 PM                  Lowella Curb, [place] Worker  General Practice  Patient Care Conference  Signed  Date of Service:  12/14/2017  9:28 AM          Signed          [] Hide copied text  [] Hover for details   Inpatient RehabilitationTeam Conference and Plan of Care Update Date: 12/13/2017   Time: 2:45 PM      Patient Name: Tammy Arnold      Medical Record Number: [id number]  Date of Birth: 1941/02/23 Sex: Female         Room/Bed: 4W08C/4W08C-01 Payor Info: Payor: MEDICARE / Plan: MEDICARE PART A AND B / Product Type: *No Product type* /     Admitting Diagnosis: Balance Syn  Admit Date/Time:  12/07/2017  5:22 PM Admission Comments: No comment available    Primary Diagnosis:  Balint's syndrome Principal Problem: Balint's syndrome       Patient Active Problem List    Diagnosis Date Noted  . Balint's syndrome  12/09/2017  . FUO (fever of unknown origin)    . Ataxia 12/05/2017  . Cognitive impairment    . Abnormal MRI    . Blurry vision    . Hypokalemia    . Acute blood loss anemia    .  TIA (transient ischemic attack) 12/04/2017  . Essential hypertension 12/04/2017  . HLD (hyperlipidemia) 12/04/2017      Expected Discharge Date: Expected Discharge Date: (1-2 weeks)   Team Members Present: Physician leading conference: Dr. 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  Patient with baseline dementia and decline prior to admission  Improve balance and coordination  Cognitive and behavioral management.  Optimize nutrition.   Bowel/Bladder     Incontinent of B/B-physically aggressive with care  Regain continence  Frequent toileting   Swallow/Nutrition/ Hydration               ADL's     Max-Total A bathing at shower level, Total A UB/LB dressing, Mod A shower transfers. Severely apraxic and cognitively impaired    Min A-supervision overall   pt/family education, ADL retraining, cognitive remediation, pain mgt, functional transfer    Mobility     min/mod assist for sit>stand and ambulation, ambulates HHA, uses rails for stair negotiation, requires max/total assist for bed mobility, poor proprioception and spatial awareness, fearful of movement  min assist overall  pt/family education, gait, transfers, activity tolerance   Communication               Safety/Cognition/ Behavioral Observations   Max-Total A  Min A  attention, problem solving, recall with use of strategies    Pain     Denies pain  Pain < 2  Assess Qshift and PRN   Skin     Scattered  generalized bruising   Prevent skin breakdown and infection  Assess Qshift and PRN     Rehab Goals Patient on target to meet rehab goals: Yes *See Care Plan and progress notes for long and short-term goals.      Barriers to Discharge   Current Status/Progress Possible Resolutions Date Resolved   Physician     Behavior        Full-time supervision at discharge to account for ongoing cognitive deficits.      Nursing                 PT  Lack of/limited family support;Home environment access/layout;Behavior;Other (comments)  unsure of husband's physical ability to care for pt, pt has no rails to enter home and would benefit from UE support for stair negotiation, impaired vision & cognition limits progress              OT Incontinence;Behavior  Anxiousness/pain. Unsure if pts spouse can provide the ADL care that she will need             SLP            SW              Discharge Planning/Teaching Needs:  Plan to return home with spouse who will need to arrange for 24/7 assistance - will follow up with him again on this.  Teaching to be completed with spouse and any other caregivers.   Team Discussion:  Pt with significant deficits in memory, motor and occular function.  ST has attempted to begin some ed with spouse.  Pt can range in function from min assist to +2 total  assist dependent on her attention/ pain/ and awareness of her own body in space.  Now out of enclosure bed.  incont of b/b.  Concern of team that progress may be minimal and family receptivity to recommendations of care may be limited.  Hope to see some progress over this next week.  Team does not feel they can yet set a target d/c date.  Plan 1-2 weeks.  Revisions to Treatment Plan:  None    Continued Need for Acute Rehabilitation Level of Care: The patient requires daily medical management by a physician with specialized training in physical medicine and rehabilitation for the following conditions: Daily direction of a  multidisciplinary physical rehabilitation program to ensure safe treatment while eliciting the highest outcome that is of practical value to the patient.: Yes Daily medical management of patient stability for increased activity during participation in an intensive rehabilitation regime.: Yes Daily analysis of laboratory values and/or radiology reports with any subsequent need for medication adjustment of medical intervention for : Neurological problems;Mood/behavior problems   Edman Lipsey 12/14/2017, 9:28 AM

## 2017-12-22 ENCOUNTER — Inpatient Hospital Stay (HOSPITAL_COMMUNITY): Payer: Medicare Other | Admitting: Physical Therapy

## 2017-12-22 ENCOUNTER — Inpatient Hospital Stay (HOSPITAL_COMMUNITY): Payer: Medicare Other | Admitting: Speech Pathology

## 2017-12-22 ENCOUNTER — Inpatient Hospital Stay (HOSPITAL_COMMUNITY): Payer: Medicare Other | Admitting: Occupational Therapy

## 2017-12-22 DIAGNOSIS — I679 Cerebrovascular disease, unspecified: Secondary | ICD-10-CM

## 2017-12-22 LAB — BASIC METABOLIC PANEL
Anion gap: 9 (ref 5–15)
BUN: 10 mg/dL (ref 6–20)
CALCIUM: 9.4 mg/dL (ref 8.9–10.3)
CO2: 26 mmol/L (ref 22–32)
CREATININE: 0.73 mg/dL (ref 0.44–1.00)
Chloride: 105 mmol/L (ref 101–111)
GLUCOSE: 89 mg/dL (ref 65–99)
Potassium: 3.9 mmol/L (ref 3.5–5.1)
Sodium: 140 mmol/L (ref 135–145)

## 2017-12-22 NOTE — Progress Notes (Signed)
[facility] PHYSICAL MEDICINE & REHABILITATION     PROGRESS NOTE    Subjective/Complaints: Patient seen working with OT this morning. She states she slept well overnight.  ROS: Limited due to cognitive/behavioral    Objective: Vital Signs: Blood pressure (!) 163/74, pulse 62, temperature 98.4 F (36.9 C), temperature source Oral, resp. rate 18, height 5\' 4"  (1.626 m), weight 55.3 kg (122 lb), SpO2 94 %. Vas [place] Lower Extremity Venous (dvt)  Result Date: 12/09/2017  Lower Venous Study Indication: Swelling. Examination Guidelines: A complete evaluation includes B-mode imaging, spectral doppler, color doppler, and power doppler as needed of all accessible portions of each vessel. Bilateral testing is considered an integral part of a complete examination. Limited examinations for reoccurring indications may be performed as noted.  Right Venous Findings: +---------+---------------+---------+-----------+----------+-------+          CompressibilityPhasicitySpontaneityPropertiesSummary +---------+---------------+---------+-----------+----------+-------+ CFV      Full           Yes      Yes                          +---------+---------------+---------+-----------+----------+-------+ FV Prox  Full                                                 +---------+---------------+---------+-----------+----------+-------+ FV Mid   Full                                                 +---------+---------------+---------+-----------+----------+-------+ FV DistalFull                                                 +---------+---------------+---------+-----------+----------+-------+ PFV      Full                                                 +---------+---------------+---------+-----------+----------+-------+ POP      Full           Yes      Yes                          +---------+---------------+---------+-----------+----------+-------+ PTV      Full                                                  +---------+---------------+---------+-----------+----------+-------+ PERO     Full                                                 +---------+---------------+---------+-----------+----------+-------+ GSV      Full                                                 +---------+---------------+---------+-----------+----------+-------+  SSV      Full                                                 +---------+---------------+---------+-----------+----------+-------+  Left Venous Findings: +---------+---------------+---------+-----------+----------+-------+          CompressibilityPhasicitySpontaneityPropertiesSummary +---------+---------------+---------+-----------+----------+-------+ CFV      Full           Yes      Yes                          +---------+---------------+---------+-----------+----------+-------+ FV Prox  Full                                                 +---------+---------------+---------+-----------+----------+-------+ FV Mid   Full                                                 +---------+---------------+---------+-----------+----------+-------+ FV DistalFull                                                 +---------+---------------+---------+-----------+----------+-------+ PFV      Full                                                 +---------+---------------+---------+-----------+----------+-------+ POP      Full           Yes      Yes                          +---------+---------------+---------+-----------+----------+-------+ PTV      Full                                                 +---------+---------------+---------+-----------+----------+-------+ PERO     Full                                                 +---------+---------------+---------+-----------+----------+-------+ GSV      Full                                                  +---------+---------------+---------+-----------+----------+-------+ SSV      Full                                                 +---------+---------------+---------+-----------+----------+-------+  Final Interpretation: Right: There is no evidence of deep vein thrombosis in the lower extremity.There is no evidence of superficial venous thrombosis. No cystic structure found in the popliteal fossa. Left: There is no evidence of deep vein thrombosis in the lower extremity.There is no evidence of superficial venous thrombosis. No cystic structure found in the popliteal fossa.  *See table(s) above for measurements and observations. Electronically signed by Deitra Mayo on 12/09/2017 at 3:22:49 PM.     Recent Labs    12/20/17 0743  WBC 6.8  HGB 10.8*  HCT 34.5*  PLT 505*   No results for input(s): NA, K, CL, GLUCOSE, BUN, CREATININE, CALCIUM in the last 72 hours.  Invalid input(s): CO CBG (last 3)  No results for input(s): GLUCAP in the last 72 hours.  Wt Readings from Last 3 Encounters:  12/22/17 55.3 kg (122 lb)  12/04/17 56.7 kg (125 lb)  07/18/15 56.7 kg (125 lb)    Physical Exam:  Nursing note and vitals reviewed. Constitutional: She appears well-developed and well-nourished. No distress.  HENT: Normocephalic and atraumatic.  Eyes: No discharge.  EOMI.  Cardiovascular: RRR. No JVD  Respiratory: Normal effort.  Clear. GI: Bowel sounds are normal. She exhibits no distension.  Musculoskeletal: No edema or tenderness in extremities  Neurological: Alert. Memory deficits.  Limited insight and awareness Motor: Bilateral upper extremities: 5/5 proximal to distal Left lower extremity: Hip flexion 4-4+/5, distally 5/5 Right lower extremity: Hip flexion 4-/5, knee extension 4+/5, ankle dorsi/plantar flexion 4+/5  Skin: Skin is warm and dry. She is not diaphoretic.  Psychiatric: Pleasant and cooperative   Assessment/Plan: 1.  Functional and cognitive deficits secondary  to Balint's Syndrome which require 3+ hours per day of interdisciplinary therapy in a comprehensive inpatient rehab setting. Physiatrist is providing close team supervision and 24 hour management of active medical problems listed below. Physiatrist and rehab team continue to assess barriers to discharge/monitor patient progress toward functional and medical goals.  Function:  Bathing Bathing position   Position: Wheelchair/chair at sink  Bathing parts Body parts bathed by patient: (UB only) Body parts bathed by helper: Right arm, Left arm, Chest, Abdomen  Bathing assist Assist Level: (total A)      Upper Body Dressing/Undressing Upper body dressing   What is the patient wearing?: Pull over shirt/dress     Pull over shirt/dress - Perfomed by patient: Thread/unthread left sleeve, Put head through opening, Pull shirt over trunk, Thread/unthread right sleeve Pull over shirt/dress - Perfomed by helper: Thread/unthread right sleeve        Upper body assist Assist Level: Touching or steadying assistance(Pt > 75%), Supervision or verbal cues      Lower Body Dressing/Undressing Lower body dressing   What is the patient wearing?: Pants, Shoes     Pants- Performed by patient: Pull pants up/down Pants- Performed by helper: Thread/unthread left pants leg, Thread/unthread right pants leg   Non-skid slipper socks- Performed by helper: Don/doff right sock, Don/doff left sock     Shoes - Performed by patient: Don/doff right shoe, Don/doff left shoe            Lower body assist Assist for lower body dressing: Touching or steadying assistance (Pt > 75%)      Toileting Toileting Toileting activity did not occur: No continent bowel/bladder event Toileting steps completed by patient: Adjust clothing prior to toileting, Performs perineal hygiene, Adjust clothing after toileting Toileting steps completed by helper: Adjust clothing prior to toileting, Performs perineal hygiene, Adjust  clothing  after toileting Toileting Assistive Devices: Grab bar or rail  Toileting assist Assist level: Touching or steadying assistance (Pt.75%)   Transfers Chair/bed transfer Chair/bed transfer activity did not occur: Safety/medical concerns Chair/bed transfer method: Ambulatory Chair/bed transfer assist level: Touching or steadying assistance (Pt > 75%) Chair/bed transfer assistive device: Armrests     Locomotion Ambulation     Max distance: >150 ft Assist level: Touching or steadying assistance (Pt > 75%)   Wheelchair Wheelchair activity did not occur: Safety/medical concerns     Assist Level: Dependent (Pt equals 0%)(for transport)  Cognition Comprehension Comprehension assist level: Understands basic 50 - 74% of the time/ requires cueing 25 - 49% of the time  Expression Expression assist level: Expresses basic 50 - 74% of the time/requires cueing 25 - 49% of the time. Needs to repeat parts of sentences.  Social Interaction Social Interaction assist level: Interacts appropriately 50 - 74% of the time - May be physically or verbally inappropriate.  Problem Solving Problem solving assist level: Solves basic less than 25% of the time - needs direction nearly all the time or does not effectively solve problems and may need a restraint for safety  Memory Memory assist level: Recognizes or recalls 25 - 49% of the time/requires cueing 50 - 75% of the time   Medical Problem List and Plan: 1.  Deficits with mobility, transfers, self-care, cognition secondary to Balint's syndrome.   Continue CIR.   2.  DVT Prophylaxis/Anticoagulation: Pharmaceutical: Lovenox    Dopplers negative 3. Severe low back pain/Pain Management: tylenol prn   Kpad, Lidoderm patch 4. Mood: Team to provide ego support. LCSW to follow with patient and husband for support.   5. Neuropsych: This patient is not capable of making decisions on her own behalf.   In low bed currently 6. Skin/Wound Care: routine pressure  relief measures.  7. Fluids/Electrolytes/Nutrition: Monitor I/O. Offer supplements. Assist with meals due to visual deficits.   Encourage intake and track ins and outs closely.   BMP within acceptable range on 2/21   Labs pending 8. HTN: Monitor BP bid    Relatively controlled on 2/28 9. Fever: Resolved   BC X 2 neg and final.  2/13 UCS with multispecies, chest x-ray reviewed and is negative   Dopplers negative 10.  Acute blood loss anemia   Hemoglobin 10.8 on 2/26    11. Urinary incontinence   Recent PVRs negative per nursing    Urecholine DC'd on 2/27  LOS (Days) 15 A FACE TO FACE EVALUATION WAS PERFORMED  Ankit Lorie Phenix, MD 12/22/2017 9:14 AM

## 2017-12-22 NOTE — Progress Notes (Signed)
Physical Therapy Session Note  Patient Details  Name: Tammy Arnold MRN: [id number] Date of Birth: December 05, 1940  Today's Date: 12/22/2017 PT Individual Time: 5284-1324 PT Individual Time Calculation (min): 70 min   Short Term Goals: Week 2:  PT Short Term Goal 1 (Week 2): Pt will complete furniture transfer from couch with mod assist +1. PT Short Term Goal 2 (Week 2): Pt will negotiate curb without rails but physical assist to simulate home environment.  Skilled Therapeutic Interventions/Progress Updates:  Pt received in w/c & agreeable to tx, pt with c/o stiffness in back. Pt ambulates around unit without AD & supervision with cuing not to furniture walk. Pt demonstrates kyphotic posture and short step & stride length & decreased gait speed. Pt engaged in standing ball toss with max cuing to catch ball instead of tapping it with pt reporting "I forgot" during activity. Pt requires total assist to recall and count to goal of 25 catches. Pt transferred to supine on mat table, then to prone, with max/total assist and significantly extra time as pt reported being fearful of pain with movement. Pt tolerated prone position for 10 minutes to focus on lumbar stretch then progressed to prone on elbows with MAX multimodal cuing as pt with difficulty obtaining position. Pt then able to transfer to supine with less physical assist than supine>prone. Pt then engaged in game of Spot It with task focusing on visual scanning and cognitive remediation. Pt able to locate 3/3 matches but then unable to point them out to therapist. At end of session pt left sitting in w/c in room with QRB & chair alarm donned, all needs within reach, telesitter in room & calmly listening to music.   Therapy Documentation Precautions:  Precautions Precautions: Fall Precaution Comments: impaired vision Restrictions Weight Bearing Restrictions: No    See Function Navigator for Current Functional Status.   Therapy/Group:  Individual Therapy  Waunita Schooner 12/22/2017, 3:38 PM

## 2017-12-22 NOTE — Progress Notes (Signed)
Speech Language Pathology Daily Session Note  Patient Details  Name: Tammy Arnold MRN: [id number] Date of Birth: 1940/12/22  Today's Date: 12/22/2017 SLP Individual Time: 1030-1130 SLP Individual Time Calculation (min): 60 min  Short Term Goals: Week 2: SLP Short Term Goal 1 (Week 2): Patient will self-monitor and correct errors during functional tasks with Max A multimodal cues.  SLP Short Term Goal 2 (Week 2): Patient will visually scan to locate specific items during functional tasks with Mod A multimodal cues.  SLP Short Term Goal 3 (Week 2): Patient will demonstrate functional problem solving with functional and familair tasks with Max A multimodal cues.  SLP Short Term Goal 4 (Week 2): Patient will demonstrate selective attention to functional tasks for ~10 minutes with Min A verbal cues for redirection.  SLP Short Term Goal 5 (Week 2): Patient will utilize exernal aids for orientation to place and time with Max A multimodal cues.   Skilled Therapeutic Interventions: Skilled treatment session focused on cognitive goals. SLP facilitated session by providing Max-Total A verbal cues for problem solving and visual scanning during a basic matching task from a field of 2. Patient demonstrated appropriate attention to tasks and demonstrate appropriate emergent awareness in regards to difficulty of task. [name] daughter present and SLP provided continued education in regards to current cognitive functioning. Patient left upright in wheelchair with quick release belt in place and daughter present. Continue with current plan of care.      Function:   Cognition Comprehension Comprehension assist level: Understands basic 50 - 74% of the time/ requires cueing 25 - 49% of the time  Expression   Expression assist level: Expresses basic 50 - 74% of the time/requires cueing 25 - 49% of the time. Needs to repeat parts of sentences.  Social Interaction Social Interaction assist level: Interacts  appropriately 50 - 74% of the time - May be physically or verbally inappropriate.  Problem Solving Problem solving assist level: Solves basic less than 25% of the time - needs direction nearly all the time or does not effectively solve problems and may need a restraint for safety  Memory Memory assist level: Recognizes or recalls less than 25% of the time/requires cueing greater than 75% of the time    Pain Pain Assessment Pain Score: 0-No pain  Therapy/Group: Individual Therapy  Vasilisa Vore 12/22/2017, 3:11 PM

## 2017-12-22 NOTE — Progress Notes (Signed)
Nutrition Follow-up  DOCUMENTATION CODES:   Not applicable  INTERVENTION:  Provide Magic cup TID with meals, each supplement provides 290 kcal and 9 grams of protein.  Encourage adequate PO intake.   NUTRITION DIAGNOSIS:   Increased nutrient needs related to other (see comment), chronic illness(balint syndrome; rehab) as evidenced by estimated needs; ongoing  GOAL:   Patient will meet greater than or equal to 90% of their needs; met  MONITOR:   PO intake, Weight trends, Supplement acceptance  REASON FOR ASSESSMENT:   Consult Assessment of nutrition requirement/status  ASSESSMENT:   77 y.o.femalewith history of HTN, progressive cognitive decline over 2-3 years.Admitted on 2/10/19withfall and difficulty talking. Symptoms consistent with balint syndrome; patient with anomia, impaired cognition, optic ataxia, oculomotor apraxia and simultanagnosia.   Meal completion has been 90-100%. Intake has been good. Will continue with magic cup at meals to aid in adequate nutrition. Labs and medications reviewed.   Diet Order:  Fall precautions Diet Heart Room service appropriate? Yes; Fluid consistency: Thin  EDUCATION NEEDS:   Not appropriate for education at this time  Skin:  Skin Assessment: Reviewed RN Assessment  Last BM:  2/27  Height:   Ht Readings from Last 1 Encounters:  12/07/17 '5\' 4"'  (1.626 m)    Weight:   Wt Readings from Last 1 Encounters:  12/22/17 122 lb (55.3 kg)    Ideal Body Weight:  54.5 kg  BMI:  Body mass index is 20.94 kg/m.  Estimated Nutritional Needs:   Kcal:  1500-1800  Protein:  65-75 grams  Fluid:  >/= 1.5 L/day    Corrin Parker, MS, RD, LDN Pager # [phone] After hours/ weekend pager # [phone]

## 2017-12-22 NOTE — Progress Notes (Signed)
Occupational Therapy Session Note  Patient Details  Name: Tammy Arnold MRN: [id number] Date of Birth: December 22, 1940  Today's Date: 12/22/2017 OT Individual Time: 0705-0800 OT Individual Time Calculation (min): 55 min    Short Term Goals: Week 2:  OT Short Term Goal 1 (Week 2): Pt will maintain sustained attention to functional task for 4 minutes with mod multimodal cues OT Short Term Goal 2 (Week 2): Pt will complete 1 grooming task in standing to improve balance  OT Short Term Goal 3 (Week 2): Pt will complete UB dressing with Mod A during 2 ADL sessions   Skilled Therapeutic Interventions/Progress Updates:    Upon entering the room, pt supine in bed with 2/10 c/o back pain but declines medication at this time. Pt performed supine >sit with supervision. Pt donning clothing items while seated on EOB with max A for LB and supervision/set up for UB dressing tasks. Pt ambulates 100' to day room with min hand held assistance. Pt seated at table for breakfast. Pt required assistance to set up tray and open containers. Pt needing feeding self with utensils in R hand this session without cues to do so. Pt needing min verbal cues to locate coffee to R side of tray this session. Pt ambulating back to room in same manner for toileting needs. Pt performed clothing management and hygiene with mod multimodal cues and increased time. Pt returning to sit in wheelchair with husband present in room. Quick release belt donned and all needs within reach upon exiting the room.  Therapy Documentation Precautions:  Precautions Precautions: Fall Precaution Comments: impaired vision Restrictions Weight Bearing Restrictions: No   Pain: Pain Assessment Pain Assessment: No/denies pain Pain Score: 0-No pain  See Function Navigator for Current Functional Status.   Therapy/Group: Individual Therapy  Gypsy Decant 12/22/2017, 12:46 PM

## 2017-12-23 ENCOUNTER — Inpatient Hospital Stay (HOSPITAL_COMMUNITY): Payer: Medicare Other | Admitting: Physical Therapy

## 2017-12-23 ENCOUNTER — Inpatient Hospital Stay (HOSPITAL_COMMUNITY): Payer: Medicare Other | Admitting: Occupational Therapy

## 2017-12-23 ENCOUNTER — Inpatient Hospital Stay (HOSPITAL_COMMUNITY): Payer: Medicare Other | Admitting: Speech Pathology

## 2017-12-23 NOTE — Progress Notes (Signed)
[place] PHYSICAL MEDICINE & REHABILITATION     PROGRESS NOTE    Subjective/Complaints: Patient seen sitting up in her chair this morning. She states she slept well overnight. No reported issues.  ROS: Limited due to cognitive/behavioral    Objective: Vital Signs: Blood pressure (!) 162/66, pulse 62, temperature 98.1 F (36.7 C), temperature source Oral, resp. rate 16, height 5\' 4"  (1.626 m), weight 55.3 kg (122 lb), SpO2 98 %. Vas [place] Lower Extremity Venous (dvt)  Result Date: 12/09/2017  Lower Venous Study Indication: Swelling. Examination Guidelines: A complete evaluation includes B-mode imaging, spectral doppler, color doppler, and power doppler as needed of all accessible portions of each vessel. Bilateral testing is considered an integral part of a complete examination. Limited examinations for reoccurring indications may be performed as noted.  Right Venous Findings: +---------+---------------+---------+-----------+----------+-------+          CompressibilityPhasicitySpontaneityPropertiesSummary +---------+---------------+---------+-----------+----------+-------+ CFV      Full           Yes      Yes                          +---------+---------------+---------+-----------+----------+-------+ FV Prox  Full                                                 +---------+---------------+---------+-----------+----------+-------+ FV Mid   Full                                                 +---------+---------------+---------+-----------+----------+-------+ FV DistalFull                                                 +---------+---------------+---------+-----------+----------+-------+ PFV      Full                                                 +---------+---------------+---------+-----------+----------+-------+ POP      Full           Yes      Yes                          +---------+---------------+---------+-----------+----------+-------+ PTV       Full                                                 +---------+---------------+---------+-----------+----------+-------+ PERO     Full                                                 +---------+---------------+---------+-----------+----------+-------+ GSV      Full                                                 +---------+---------------+---------+-----------+----------+-------+  SSV      Full                                                 +---------+---------------+---------+-----------+----------+-------+  Left Venous Findings: +---------+---------------+---------+-----------+----------+-------+          CompressibilityPhasicitySpontaneityPropertiesSummary +---------+---------------+---------+-----------+----------+-------+ CFV      Full           Yes      Yes                          +---------+---------------+---------+-----------+----------+-------+ FV Prox  Full                                                 +---------+---------------+---------+-----------+----------+-------+ FV Mid   Full                                                 +---------+---------------+---------+-----------+----------+-------+ FV DistalFull                                                 +---------+---------------+---------+-----------+----------+-------+ PFV      Full                                                 +---------+---------------+---------+-----------+----------+-------+ POP      Full           Yes      Yes                          +---------+---------------+---------+-----------+----------+-------+ PTV      Full                                                 +---------+---------------+---------+-----------+----------+-------+ PERO     Full                                                 +---------+---------------+---------+-----------+----------+-------+ GSV      Full                                                  +---------+---------------+---------+-----------+----------+-------+ SSV      Full                                                 +---------+---------------+---------+-----------+----------+-------+  Final Interpretation: Right: There is no evidence of deep vein thrombosis in the lower extremity.There is no evidence of superficial venous thrombosis. No cystic structure found in the popliteal fossa. Left: There is no evidence of deep vein thrombosis in the lower extremity.There is no evidence of superficial venous thrombosis. No cystic structure found in the popliteal fossa.  *See table(s) above for measurements and observations. Electronically signed by Deitra Mayo on 12/09/2017 at 3:22:49 PM.     No results for input(s): WBC, HGB, HCT, PLT in the last 72 hours. Recent Labs    12/22/17 1022  NA 140  K 3.9  CL 105  GLUCOSE 89  BUN 10  CREATININE 0.73  CALCIUM 9.4   CBG (last 3)  No results for input(s): GLUCAP in the last 72 hours.  Wt Readings from Last 3 Encounters:  12/23/17 55.3 kg (122 lb)  12/04/17 56.7 kg (125 lb)  07/18/15 56.7 kg (125 lb)    Physical Exam:  Nursing note and vitals reviewed. Constitutional: She appears well-developed and well-nourished. No distress.  HENT: Normocephalic and atraumatic.  Eyes: No discharge.  EOMI.  Cardiovascular: RRR. No JVD  Respiratory: Normal effort.  Clear. GI: Bowel sounds are normal. She exhibits no distension.  Musculoskeletal: No edema or tenderness in extremities  Neurological: Alert. Memory deficits.  Limited insight and awareness Motor: Bilateral upper extremities: 5/5 proximal to distal Left lower extremity: Hip flexion 4-4+/5, distally 5/5 Right lower extremity: Hip flexion 4--4/5, knee extension 4+/5, ankle dorsi/plantar flexion 4+/5  Skin: Skin is warm and dry. She is not diaphoretic.  Psychiatric: Pleasant and cooperative   Assessment/Plan: 1.  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 Physiatrist is providing close team supervision and 24 hour management of active medical problems listed below. Physiatrist and rehab team continue to assess barriers to discharge/monitor patient progress toward functional and medical goals.  Function:  Bathing Bathing position   Position: Wheelchair/chair at sink  Bathing parts Body parts bathed by patient: (UB only) Body parts bathed by helper: Right arm, Left arm, Chest, Abdomen  Bathing assist Assist Level: (total A)      Upper Body Dressing/Undressing Upper body dressing   What is the patient wearing?: Pull over shirt/dress     Pull over shirt/dress - Perfomed by patient: Thread/unthread left sleeve, Put head through opening, Pull shirt over trunk, Thread/unthread right sleeve Pull over shirt/dress - Perfomed by helper: Thread/unthread right sleeve        Upper body assist Assist Level: Supervision or verbal cues, Set up   Set up : To obtain clothing/put away  Lower Body Dressing/Undressing Lower body dressing   What is the patient wearing?: Pants, Non-skid slipper socks     Pants- Performed by patient: Pull pants up/down Pants- Performed by helper: Thread/unthread left pants leg, Thread/unthread right pants leg   Non-skid slipper socks- Performed by helper: Don/doff right sock, Don/doff left sock     Shoes - Performed by patient: Don/doff right shoe, Don/doff left shoe            Lower body assist Assist for lower body dressing: (max A)      Toileting Toileting Toileting activity did not occur: No continent bowel/bladder event Toileting steps completed by patient: Adjust clothing prior to toileting, Performs perineal hygiene, Adjust clothing after toileting Toileting steps completed by helper: Adjust clothing prior to toileting, Performs perineal hygiene, Adjust clothing after  toileting Toileting  Assistive Devices: Grab bar or rail  Toileting assist Assist level: Touching or steadying assistance (Pt.75%)   Transfers Chair/bed transfer Chair/bed transfer activity did not occur: Safety/medical concerns Chair/bed transfer method: Ambulatory Chair/bed transfer assist level: Touching or steadying assistance (Pt > 75%) Chair/bed transfer assistive device: Armrests     Locomotion Ambulation     Max distance: >150 ft  Assist level: Supervision or verbal cues   Wheelchair Wheelchair activity did not occur: Safety/medical concerns     Assist Level: Dependent (Pt equals 0%)(for transport)  Cognition Comprehension Comprehension assist level: Understands basic 50 - 74% of the time/ requires cueing 25 - 49% of the time  Expression Expression assist level: Expresses basic 50 - 74% of the time/requires cueing 25 - 49% of the time. Needs to repeat parts of sentences.  Social Interaction Social Interaction assist level: Interacts appropriately 50 - 74% of the time - May be physically or verbally inappropriate.  Problem Solving Problem solving assist level: Solves basic less than 25% of the time - needs direction nearly all the time or does not effectively solve problems and may need a restraint for safety  Memory Memory assist level: Recognizes or recalls less than 25% of the time/requires cueing greater than 75% of the time   Medical Problem List and Plan: 1.  Deficits with mobility, transfers, self-care, cognition secondary to Balint's syndrome.   Continue CIR.   2.  DVT Prophylaxis/Anticoagulation: Pharmaceutical: Lovenox    Dopplers negative 3. Severe low back pain/Pain Management: tylenol prn   Kpad, Lidoderm patch 4. Mood: Team to provide ego support. LCSW to follow with patient and husband for support.   5. Neuropsych: This patient is not capable of making decisions on her own behalf.   In low bed currently 6. Skin/Wound Care: routine pressure relief measures.  7.  Fluids/Electrolytes/Nutrition: Monitor I/O. Offer supplements. Assist with meals due to visual deficits.   Encourage intake and track ins and outs closely.   BMP within acceptable range on 2/28 8. HTN: Monitor BP bid    Labile/? Trending up over last 24 hours, otherwise relatively controlled. Will consider medications if persistently elevated. 9. Fever: Resolved   BC X 2 neg and final.  2/13 UCS with multispecies, chest x-ray reviewed and is negative   Dopplers negative 10.  Acute blood loss anemia   Hemoglobin 10.8 on 2/26    11. Urinary incontinence: Improving   Recent PVRs negative per nursing    Urecholine DC'd on 2/27  LOS (Days) 16 A FACE TO FACE EVALUATION WAS PERFORMED  Marionna Gonia Lorie Phenix, MD 12/23/2017 9:36 AM

## 2017-12-23 NOTE — Progress Notes (Signed)
Speech Language Pathology Weekly Progress and Session Note  Patient Details  Name: RAPHAELLA LARKIN MRN: [id number] Date of Birth: Nov 23, 1940  Beginning of progress report period: December 16, 2017 End of progress report period: December 23, 2017  Today's Date: 12/23/2017 SLP Individual Time: 1015-1100 SLP Individual Time Calculation (min): 45 min  Short Term Goals: Week 2: SLP Short Term Goal 1 (Week 2): Patient will self-monitor and correct errors during functional tasks with Max A multimodal cues.  SLP Short Term Goal 1 - Progress (Week 2): Not met SLP Short Term Goal 2 (Week 2): Patient will visually scan to locate specific items during functional tasks with Mod A multimodal cues.  SLP Short Term Goal 2 - Progress (Week 2): Not met SLP Short Term Goal 3 (Week 2): Patient will demonstrate functional problem solving with functional and familair tasks with Max A multimodal cues.  SLP Short Term Goal 3 - Progress (Week 2): Not met SLP Short Term Goal 4 (Week 2): Patient will demonstrate selective attention to functional tasks for ~10 minutes with Min A verbal cues for redirection.  SLP Short Term Goal 4 - Progress (Week 2): Met SLP Short Term Goal 5 (Week 2): Patient will utilize exernal aids for orientation to place and time with Max A multimodal cues.  SLP Short Term Goal 5 - Progress (Week 2): Not met    New Short Term Goals: Week 3: SLP Short Term Goal 1 (Week 3): Patient will visually scan to locate specific items during functional tasks with Mod A multimodal cues.  SLP Short Term Goal 2 (Week 3): Patient will demonstrate selective attention to functional tasks for ~10 minutes with Min A verbal cues for redirection in a moderately distracting enviornment.  SLP Short Term Goal 3 (Week 3): Patient will self-monitor and correct errors during functional tasks with Max A multimodal cues.  SLP Short Term Goal 4 (Week 3): Patient will demonstrate functional problem solving with functional and  familair tasks with Max A multimodal cues.  SLP Short Term Goal 5 (Week 3): Patient will utilize exernal aids for orientation to place and time with Max A multimodal cues.   Weekly Progress Updates: Patient has made minimal gains and has not met any STG's this reporting period. Currently, patient continues to require overall Max-Total A to complete functional and familiar tasks safely in regards to problem solving, recall with use of aids, visual scanning/visual attention and awareness. Patient and family education ongoing. Patient would benefit from continued skilled SLP intervention to maximize her cognitive function and overall functional independence prior to discharge.      Intensity: Minumum of 1-2 x/day, 30 to 90 minutes Frequency: 3 to 5 out of 7 days Duration/Length of Stay: TBD due to SNF placement  Treatment/Interventions: Cognitive remediation/compensation;Environmental controls;Internal/external aids;Therapeutic Activities;Patient/family education;Functional tasks;Cueing hierarchy   Daily Session  Skilled Therapeutic Interventions:  Skilled treatment session focused on cognitive goals. SLP facilitated session by providing extra time, visual cues and limiting visual distractions while decoding at the phrase level. Patient able to immediately recall 1 detail after reading 2 sentences. Patient also requested to change bras and required overall Max A verbal cues for problem solving with dressing. Patient left upright in wheelchair with husband present. Continue with current plan of care.       Function:    Cognition Comprehension Comprehension assist level: Understands basic 50 - 74% of the time/ requires cueing 25 - 49% of the time  Expression   Expression assist level: Expresses basic  50 - 74% of the time/requires cueing 25 - 49% of the time. Needs to repeat parts of sentences.  Social Interaction Social Interaction assist level: Interacts appropriately 50 - 74% of the time - May  be physically or verbally inappropriate.  Problem Solving Problem solving assist level: Solves basic less than 25% of the time - needs direction nearly all the time or does not effectively solve problems and may need a restraint for safety  Memory Memory assist level: Recognizes or recalls 25 - 49% of the time/requires cueing 50 - 75% of the time   Pain Pain Assessment Pain Assessment: No/denies pain Pain Score: 0-No pain  Therapy/Group: Individual Therapy  Rekisha Welling 12/23/2017, 3:54 PM

## 2017-12-23 NOTE — Progress Notes (Signed)
Physical Therapy Note  Patient Details  Name: Tammy Arnold MRN: [id number] Date of Birth: 1941/10/11 Today's Date: 12/23/2017    Time: 1440-1508 28 minutes  1:1 No c/o pain.  Pt performs gait throughout unit with min guard, cues for path finding due to visual deficits.  Visual scanning task with mod/max multimodal cues.  nustep x 8 minutes level 3 for improved activity tolerance and strength.  Pt with flat affect and depressive symptoms during session, continues to state "I'm not going to leave here, I'm falling apart".   Latosha Gaylord 12/23/2017, 3:10 PM

## 2017-12-23 NOTE — Progress Notes (Signed)
One attempt OOB without assistance during night. Difficult to reorient. Tammy Arnold

## 2017-12-23 NOTE — Progress Notes (Signed)
Physical Therapy Session Note  Patient Details  Name: Tammy Arnold MRN: [id number] Date of Birth: 08/08/1941  Today's Date: 12/23/2017 PT Individual Time: 4628-6381 PT Individual Time Calculation (min): 70 min   Short Term Goals: Week 2:  PT Short Term Goal 1 (Week 2): Pt will complete furniture transfer from couch with mod assist +1. PT Short Term Goal 2 (Week 2): Pt will negotiate curb without rails but physical assist to simulate home environment.  Skilled Therapeutic Interventions/Progress Updates:  Pt received in room with husband present for session. Pt c/o 2/10 HA & RN made aware but reports pt is premedicated. Pt ambulates throughout unit without AD & supervision with decreased gait speed and continued kyphotic posture. Pt located various items in apartment (refrigerator, bowl of fruit) with max cuing as pt initially locating microwave but unable to name appliance even from choice of 2. Pt requires max assist to doff & don pillowcases on pillows. Pt engaged in ball toss with total assist to recall goal of 25 catches/tosses. Pt unable to consistently catch ball and instead attempts to tap it back to therapist. In dayroom pt engaged in task requiring her to identify playing cards. Pt able to identify number on card but required choice of 2 to correctly name suit & was only correct 50% of the time.  Pt's husband discussing pt's PLOF and impaired memory prior to admission. Tammy Arnold reports pt's mobility has improved which "is the most important thing". Therapist continued to educate Tammy Arnold on pt's visual & cognitive limitations & providing examples of each; therapist also thoroughly educated him on pt's need for 24 hr supervision upon d/c. At end of session pt left sitting in w/c with QRB & chair alarm donned & husband present to supervise, call bell within reach & telesitter in room.   Therapy Documentation Precautions:  Precautions Precautions: Fall Precaution Comments: impaired  vision Restrictions Weight Bearing Restrictions: No   See Function Navigator for Current Functional Status.   Therapy/Group: Individual Therapy  Waunita Schooner 12/23/2017, 2:38 PM

## 2017-12-23 NOTE — Progress Notes (Signed)
Occupational Therapy Session Note  Patient Details  Name: Tammy Arnold MRN: [id number] Date of Birth: Jan 22, 1941  Today's Date: 12/23/2017 OT Individual Time: 0350-0938 OT Individual Time Calculation (min): 58 min   Short Term Goals: Week 2:  OT Short Term Goal 1 (Week 2): Pt will maintain sustained attention to functional task for 4 minutes with mod multimodal cues OT Short Term Goal 2 (Week 2): Pt will complete 1 grooming task in standing to improve balance  OT Short Term Goal 3 (Week 2): Pt will complete UB dressing with Mod A during 2 ADL sessions   Skilled Therapeutic Interventions/Progress Updates:    Pt greeted in w/c with spouse present. Amenable to changing out of pajamas and into her day clothes. She ambulated with HHA to dresser to select necessary clothing items, requiring HOH for locating drawer handles and max multimodal cues for retrieving what she needed. Per request, pt then ambulated to bathroom, able to locate knob and open door without cues. Once seated on toilet, pt proceeded with doffing clothes. Able to unfasten buttons unassisted. Cues for sequencing required when doffing and donning all clothing items. Pt often completing 1 step and then asking therapist "what next?" and unable to self organize with extra time and HOH alone. Intermittently able to visually identify clothing correctly. Pt donned bralette with Min A and overhead shirt with supervision. Pt able to complete 2/3 components of donning pants with significantly increased time and assist for orientation (she tried thread both arms into pant legs on 2 occasions). Next she ambulated to sink and completed oral care/grooming tasks with max multimodal cues for sequencing, problem solving, and scanning. Pt still unable to consistently recognize grooming items visually. When asked to point to toothbrush, she would pull at faucet handle or pick up pill crusher. At end of tx pt returned to w/c and was left with safety belt  fastened and spouse present.   Therapy Documentation Precautions:  Precautions Precautions: Fall Precaution Comments: impaired vision Restrictions Weight Bearing Restrictions: No Vital Signs:  Pain: No c/o pain during session  Pain Assessment Pain Assessment: No/denies pain Pain Score: 0-No pain ADL:     See Function Navigator for Current Functional Status.   Therapy/Group: Individual Therapy  Tvisha Schwoerer A Koni Kannan 12/23/2017, 1:10 PM

## 2017-12-23 NOTE — Plan of Care (Signed)
  Not Progressing RH COGNITION-NURSING RH STG USES MEMORY AIDS/STRATEGIES W/ASSIST TO PROBLEM SOLVE Description STG Uses Memory Aids/Strategies With max Assistance to Problem Solve.  Total assist 12/23/2017 0316 - Not Progressing by Cornell Barman, RN RH STG ANTICIPATES NEEDS/CALLS FOR ASSIST W/ASSIST/CUES Description STG Anticipates Needs/Calls for Assist With max Assistance/Cues.  12/23/2017 0316 - Not Progressing by Cornell Barman, RN

## 2017-12-24 MED ORDER — LISINOPRIL 2.5 MG PO TABS
2.5000 mg | ORAL_TABLET | Freq: Every day | ORAL | Status: DC
  Administered 2017-12-24 – 2017-12-27 (×4): 2.5 mg via ORAL
  Filled 2017-12-24 (×4): qty 1

## 2017-12-24 NOTE — Progress Notes (Signed)
[facility]     PROGRESS NOTE    Subjective/Complaints: No new complaints today.  She is oriented to person only.  Denies any pain  ROS: Limited due to cognitive/behavioral    Objective: Vital Signs: Blood pressure (!) 166/77, pulse 83, temperature 98.3 F (36.8 C), temperature source Oral, resp. rate 18, height 5\' 4"  (1.626 m), weight 56.7 kg (125 lb), SpO2 99 %. Vas [place] Lower Extremity Venous (dvt)  Result Date: 12/09/2017  Lower Venous Study Indication: Swelling. Examination Guidelines: A complete evaluation includes B-mode imaging, spectral doppler, color doppler, and power doppler as needed of all accessible portions of each vessel. Bilateral testing is considered an integral part of a complete examination. Limited examinations for reoccurring indications may be performed as noted.  Right Venous Findings: +---------+---------------+---------+-----------+----------+-------+          CompressibilityPhasicitySpontaneityPropertiesSummary +---------+---------------+---------+-----------+----------+-------+ CFV      Full           Yes      Yes                          +---------+---------------+---------+-----------+----------+-------+ FV Prox  Full                                                 +---------+---------------+---------+-----------+----------+-------+ FV Mid   Full                                                 +---------+---------------+---------+-----------+----------+-------+ FV DistalFull                                                 +---------+---------------+---------+-----------+----------+-------+ PFV      Full                                                 +---------+---------------+---------+-----------+----------+-------+ POP      Full           Yes      Yes                          +---------+---------------+---------+-----------+----------+-------+ PTV      Full                                                  +---------+---------------+---------+-----------+----------+-------+ PERO     Full                                                 +---------+---------------+---------+-----------+----------+-------+ GSV      Full                                                 +---------+---------------+---------+-----------+----------+-------+  SSV      Full                                                 +---------+---------------+---------+-----------+----------+-------+  Left Venous Findings: +---------+---------------+---------+-----------+----------+-------+          CompressibilityPhasicitySpontaneityPropertiesSummary +---------+---------------+---------+-----------+----------+-------+ CFV      Full           Yes      Yes                          +---------+---------------+---------+-----------+----------+-------+ FV Prox  Full                                                 +---------+---------------+---------+-----------+----------+-------+ FV Mid   Full                                                 +---------+---------------+---------+-----------+----------+-------+ FV DistalFull                                                 +---------+---------------+---------+-----------+----------+-------+ PFV      Full                                                 +---------+---------------+---------+-----------+----------+-------+ POP      Full           Yes      Yes                          +---------+---------------+---------+-----------+----------+-------+ PTV      Full                                                 +---------+---------------+---------+-----------+----------+-------+ PERO     Full                                                 +---------+---------------+---------+-----------+----------+-------+ GSV      Full                                                  +---------+---------------+---------+-----------+----------+-------+ SSV      Full                                                 +---------+---------------+---------+-----------+----------+-------+  Final Interpretation: Right: There is no evidence of deep vein thrombosis in the lower extremity.There is no evidence of superficial venous thrombosis. No cystic structure found in the popliteal fossa. Left: There is no evidence of deep vein thrombosis in the lower extremity.There is no evidence of superficial venous thrombosis. No cystic structure found in the popliteal fossa.  *See table(s) above for measurements and observations. Electronically signed by Deitra Mayo on 12/09/2017 at 3:22:49 PM.     No results for input(s): WBC, HGB, HCT, PLT in the last 72 hours. Recent Labs    12/22/17 1022  NA 140  K 3.9  CL 105  GLUCOSE 89  BUN 10  CREATININE 0.73  CALCIUM 9.4   CBG (last 3)  No results for input(s): GLUCAP in the last 72 hours.  Wt Readings from Last 3 Encounters:  12/24/17 56.7 kg (125 lb)  12/04/17 56.7 kg (125 lb)  07/18/15 56.7 kg (125 lb)    Physical Exam:  Nursing note and vitals reviewed. Constitutional: She appears well-developed and well-nourished. No distress.  HENT: Normocephalic and atraumatic.  Eyes: No discharge.  EOMI. is able to identify simple objects such as number of fingers. pen Cardiovascular: RRR. No JVD  Respiratory: Normal effort.  Clear. GI: Bowel sounds are normal. She exhibits no distension.  Musculoskeletal: No edema or tenderness in extremities  Neurological: Alert. Memory deficits.  Limited insight and awareness Motor: Bilateral upper extremities: 5/5 proximal to distal Left lower extremity: Hip flexion 4-4+/5, distally 5/5 Right lower extremity: Hip flexion 4--4/5, knee extension 4+/5, ankle dorsi/plantar flexion 4+/5  Skin: Skin is warm and dry. She is not diaphoretic.  Psychiatric: Pleasant and  cooperative   Assessment/Plan: 1.  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 Physiatrist is providing close team supervision and 24 hour management of active medical problems listed below. Physiatrist and rehab team continue to assess barriers to discharge/monitor patient progress toward functional and medical goals.  Function:  Bathing Bathing position   Position: Wheelchair/chair at sink  Bathing parts Body parts bathed by patient: (UB only) Body parts bathed by helper: Right arm, Left arm, Chest, Abdomen  Bathing assist Assist Level: (total A)      Upper Body Dressing/Undressing Upper body dressing   What is the patient wearing?: Bra, Pull over shirt/dress Bra - Perfomed by patient: Thread/unthread left bra strap, Hook/unhook bra (pull down sports bra) Bra - Perfomed by helper: Thread/unthread right bra strap Pull over shirt/dress - Perfomed by patient: Thread/unthread left sleeve, Put head through opening, Pull shirt over trunk, Thread/unthread right sleeve Pull over shirt/dress - Perfomed by helper: Thread/unthread right sleeve        Upper body assist Assist Level: Touching or steadying assistance(Pt > 75%)   Set up : To obtain clothing/put away  Lower Body Dressing/Undressing Lower body dressing   What is the patient wearing?: Pants, Shoes     Pants- Performed by patient: Thread/unthread left pants leg, Pull pants up/down Pants- Performed by helper: Thread/unthread right pants leg   Non-skid slipper socks- Performed by helper: Don/doff right sock, Don/doff left sock     Shoes - Performed by patient: Don/doff right shoe, Don/doff left shoe Shoes - Performed by helper: Don/doff right shoe, Don/doff left shoe          Lower body assist Assist for lower body dressing: (max A)      Toileting Toileting Toileting activity did  not occur: No  continent bowel/bladder event Toileting steps completed by patient: Adjust clothing prior to toileting, Performs perineal hygiene, Adjust clothing after toileting Toileting steps completed by helper: Adjust clothing prior to toileting, Performs perineal hygiene, Adjust clothing after toileting Toileting Assistive Devices: Grab bar or rail  Toileting assist Assist level: Touching or steadying assistance (Pt.75%)   Transfers Chair/bed transfer Chair/bed transfer activity did not occur: Safety/medical concerns Chair/bed transfer method: Ambulatory Chair/bed transfer assist level: Touching or steadying assistance (Pt > 75%) Chair/bed transfer assistive device: Armrests     Locomotion Ambulation     Max distance: 150 ft Assist level: Supervision or verbal cues   Wheelchair Wheelchair activity did not occur: Safety/medical concerns     Assist Level: Dependent (Pt equals 0%)(for transport)  Cognition Comprehension Comprehension assist level: Understands basic 50 - 74% of the time/ requires cueing 25 - 49% of the time  Expression Expression assist level: Expresses basic 50 - 74% of the time/requires cueing 25 - 49% of the time. Needs to repeat parts of sentences.  Social Interaction Social Interaction assist level: Interacts appropriately 50 - 74% of the time - May be physically or verbally inappropriate.  Problem Solving Problem solving assist level: Solves basic less than 25% of the time - needs direction nearly all the time or does not effectively solve problems and may need a restraint for safety  Memory Memory assist level: Recognizes or recalls 25 - 49% of the time/requires cueing 50 - 75% of the time   Medical Problem List and Plan: 1.  Deficits with mobility, transfers, self-care, cognition secondary to Balint's syndrome.   Continue CIR PT, OT, speech.   2.  DVT Prophylaxis/Anticoagulation: Pharmaceutical: Lovenox    Dopplers negative 3. Severe low back pain/Pain Management:  tylenol prn, no current pain complaints   Kpad, Lidoderm patch 4. Mood: Team to provide ego support. LCSW to follow with patient and husband for support.   5. Neuropsych: This patient is not capable of making decisions on her own behalf.   In low bed currently 6. Skin/Wound Care: routine pressure relief measures.  7. Fluids/Electrolytes/Nutrition: Monitor I/O. Offer supplements. Assist with meals due to visual deficits.   intake only 300 mL yesterday   BMP within acceptable range on 2/28 8. HTN: Monitor BP bid     Vitals:   12/23/17 1439 12/24/17 0500  BP: (!) 143/86 (!) 166/77  Pulse: 69 83  Resp: 18 18  Temp: 98.3 F (36.8 C) 98.3 F (36.8 C)  SpO2: 100% 99%  We will start low-dose ACE inhibitor 9. Fever: Resolved   BC X 2 neg and final.  2/13 UCS with multispecies, chest x-ray reviewed and is negative   Dopplers negative 10.  Acute blood loss anemia   Hemoglobin 10.8 on 2/26    11. Urinary incontinence: Improving   Recent PVRs negative per nursing    Urecholine DC'd on 2/27  LOS (Days) 17 A FACE TO FACE EVALUATION WAS PERFORMED  Charlett Blake, MD 12/24/2017 11:18 AM

## 2017-12-24 NOTE — Plan of Care (Deleted)
  RH BOWEL ELIMINATION RH STG MANAGE BOWEL WITH ASSISTANCE Description STG Manage Bowel with mod Assistance.  12/24/2017 0149 - Not Progressing by Dorice Lamas, RN   RH BOWEL ELIMINATION RH STG MANAGE BOWEL W/MEDICATION W/ASSISTANCE Description STG Manage Bowel with Medication with mod Assistance.  12/24/2017 0149 - Not Progressing by Dorice Lamas, RN  LBM 12-21-17

## 2017-12-25 ENCOUNTER — Inpatient Hospital Stay (HOSPITAL_COMMUNITY): Payer: Medicare Other | Admitting: Physical Therapy

## 2017-12-25 NOTE — Progress Notes (Signed)
Physical Therapy Weekly Progress Note  Patient Details  Name: Tammy Arnold MRN: [id number] Date of Birth: 12/24/1940  Beginning of progress report period: December 15, 2017 End of progress report period: December 25, 2017  Today's Date: 12/25/2017 PT Individual Time: 4132-4401 PT Individual Time Calculation (min): 59 min   Patient has met 1 of 2 short term goals.  Pt has progressed to close supervision<>steady assist overall for functional mobility without an AD but continues to be significantly limited by impaired cognition. Pt only consistently oriented to self but at times is unable to recall her birthday. Pt's family continues to be present intermittently to observe treatment sessions and therapist educates them on pt's limitations and safety recommendations. Pt will need constant 24 hr supervision/assist upon d/c 2/2 significant cognitive deficits and impaired motor planning, apraxia, and vision.  Patient continues to demonstrate the following deficits muscle weakness, decreased cardiorespiratoy endurance, motor apraxia and decreased coordination, decreased visual acuity and decreased visual perceptual skills, decreased attention to right, decreased initiation, decreased attention, decreased awareness, decreased problem solving, decreased safety awareness, decreased memory and delayed processing, and decreased standing balance, decreased postural control and decreased balance strategies and therefore will continue to benefit from skilled PT intervention to increase functional independence with mobility.  Patient progressing toward long term goals.  Continue plan of care.  PT Short Term Goals Week 2:  PT Short Term Goal 1 (Week 2): Pt will complete furniture transfer from couch with mod assist +1. PT Short Term Goal 1 - Progress (Week 2): Met PT Short Term Goal 2 (Week 2):  Pt will negotiate curb without rails but physical assist to simulate home environment. PT Short Term Goal 2 - Progress  (Week 2): Progressing toward goal Week 3:  PT Short Term Goal 1 (Week 3): STG = LTG due to anticipated d/c to SNF.  Skilled Therapeutic Interventions/Progress Updates:  Pt received in w/c & agreeable to tx, no c/o pain reported. Pt able to correctly select therapist's name from a choice of 2. Pt ambulates around unit without AD & supervision. Pt utilized dynavision while standing without UE support; pt with improving attention to task and visual scanning. Pt able to locate & press all red lights with extra time but continues to demonstrate decreased attention to R side, especially upper quadrant. Pt's lunch arrived & pt requested to eat. Pt requires max cuing to remove top off of one container and therapist provided assistance for the rest. Pt unable to recall whether salad dressing went on top of sandwich or salad, frequently confusing napkin with ticket, and requires total assist to spear fruit on fork 2/2 visuo-spatial and cognitive impairments. Pt frequently irritated by inability to properly feed herself but is unable to correct without max cuing/assistance and encouragement. Pt ambulates back to room with total assist for path finding. Pt with continent void on toilet and performed perihygiene without assistance but required total cuing to locate soap and perform hand hygiene standing at sink. Therapist provided total assistance for calling & placing dinner & breakfast order. At end of session pt left sitting in w/c with QRB & chair alarm donned & all needs within reach.  Therapy Documentation Precautions:  Precautions Precautions: Fall Precaution Comments: impaired vision Restrictions Weight Bearing Restrictions: No     See Function Navigator for Current Functional Status.  Therapy/Group: Individual Therapy  Waunita Schooner 12/25/2017, 3:03 PM

## 2017-12-25 NOTE — Progress Notes (Signed)
Irritable at HS, refused scheduled tylenol. "I'm not taking it, I don't need it!!!" At nurses station most of night. A & O to self only, no carry over. Tammy Arnold A

## 2017-12-25 NOTE — Plan of Care (Signed)
  Not Progressing RH SAFETY RH STG ADHERE TO SAFETY PRECAUTIONS W/ASSISTANCE/DEVICE Description STG Adhere to Safety Precautions With max Assistance/Device.  12/25/2017 1314 - Not Progressing by Terryn Redner, Renato Gails, RN RH COGNITION-NURSING RH STG USES MEMORY AIDS/STRATEGIES W/ASSIST TO PROBLEM SOLVE Description STG Uses Memory Aids/Strategies With max Assistance to Problem Solve.  12/25/2017 1314 - Not Progressing by Valecia Beske, Renato Gails, RN RH STG ANTICIPATES NEEDS/CALLS FOR ASSIST W/ASSIST/CUES Description STG Anticipates Needs/Calls for Assist With max Assistance/Cues.  12/25/2017 1314 - Not Progressing by Mclane Arora, Renato Gails, RN

## 2017-12-25 NOTE — Plan of Care (Signed)
  Not Progressing RH SKIN INTEGRITY RH STG MAINTAIN SKIN INTEGRITY WITH ASSISTANCE Description STG Maintain Skin Integrity With mod Assistance.  12/25/2017 0214 - Not Progressing by Cornell Barman, RN RH SAFETY RH STG ADHERE TO SAFETY PRECAUTIONS W/ASSISTANCE/DEVICE Description STG Adhere to Safety Precautions With max Assistance/Device.  12/25/2017 0214 - Not Progressing by Cornell Barman, RN RH COGNITION-NURSING RH STG USES MEMORY AIDS/STRATEGIES W/ASSIST TO PROBLEM SOLVE Description STG Uses Memory Aids/Strategies With max Assistance to Problem Solve.  12/25/2017 0214 - Not Progressing by Cornell Barman, RN RH STG ANTICIPATES NEEDS/CALLS FOR ASSIST W/ASSIST/CUES Description STG Anticipates Needs/Calls for Assist With max Assistance/Cues.  12/25/2017 0214 - Not Progressing by Cornell Barman, RN

## 2017-12-25 NOTE — Progress Notes (Signed)
[place] PHYSICAL MEDICINE & REHABILITATION     PROGRESS NOTE    Subjective/Complaints: Patient without complaints today.  Her husband just left the room and she was able to tell me that it was the person that she lives with but could not remember his name.  ROS: Limited due to cognitive/behavioral    Objective: Vital Signs: Blood pressure (!) 155/61, pulse 79, temperature 98.4 F (36.9 C), temperature source Oral, resp. rate 16, height 5\' 4"  (1.626 m), weight 57 kg (125 lb 10.6 oz), SpO2 99 %. Vas [place] Lower Extremity Venous (dvt)  Result Date: 12/09/2017  Lower Venous Study Indication: Swelling. Examination Guidelines: A complete evaluation includes B-mode imaging, spectral doppler, color doppler, and power doppler as needed of all accessible portions of each vessel. Bilateral testing is considered an integral part of a complete examination. Limited examinations for reoccurring indications may be performed as noted.  Right Venous Findings: +---------+---------------+---------+-----------+----------+-------+          CompressibilityPhasicitySpontaneityPropertiesSummary +---------+---------------+---------+-----------+----------+-------+ CFV      Full           Yes      Yes                          +---------+---------------+---------+-----------+----------+-------+ FV Prox  Full                                                 +---------+---------------+---------+-----------+----------+-------+ FV Mid   Full                                                 +---------+---------------+---------+-----------+----------+-------+ FV DistalFull                                                 +---------+---------------+---------+-----------+----------+-------+ PFV      Full                                                 +---------+---------------+---------+-----------+----------+-------+ POP      Full           Yes      Yes                           +---------+---------------+---------+-----------+----------+-------+ PTV      Full                                                 +---------+---------------+---------+-----------+----------+-------+ PERO     Full                                                 +---------+---------------+---------+-----------+----------+-------+ GSV  Full                                                 +---------+---------------+---------+-----------+----------+-------+ SSV      Full                                                 +---------+---------------+---------+-----------+----------+-------+  Left Venous Findings: +---------+---------------+---------+-----------+----------+-------+          CompressibilityPhasicitySpontaneityPropertiesSummary +---------+---------------+---------+-----------+----------+-------+ CFV      Full           Yes      Yes                          +---------+---------------+---------+-----------+----------+-------+ FV Prox  Full                                                 +---------+---------------+---------+-----------+----------+-------+ FV Mid   Full                                                 +---------+---------------+---------+-----------+----------+-------+ FV DistalFull                                                 +---------+---------------+---------+-----------+----------+-------+ PFV      Full                                                 +---------+---------------+---------+-----------+----------+-------+ POP      Full           Yes      Yes                          +---------+---------------+---------+-----------+----------+-------+ PTV      Full                                                 +---------+---------------+---------+-----------+----------+-------+ PERO     Full                                                  +---------+---------------+---------+-----------+----------+-------+ GSV      Full                                                 +---------+---------------+---------+-----------+----------+-------+  SSV      Full                                                 +---------+---------------+---------+-----------+----------+-------+    Final Interpretation: Right: There is no evidence of deep vein thrombosis in the lower extremity.There is no evidence of superficial venous thrombosis. No cystic structure found in the popliteal fossa. Left: There is no evidence of deep vein thrombosis in the lower extremity.There is no evidence of superficial venous thrombosis. No cystic structure found in the popliteal fossa.  *See table(s) above for measurements and observations. Electronically signed by Deitra Mayo on 12/09/2017 at 3:22:49 PM.     No results for input(s): WBC, HGB, HCT, PLT in the last 72 hours. Recent Labs    12/22/17 1022  NA 140  K 3.9  CL 105  GLUCOSE 89  BUN 10  CREATININE 0.73  CALCIUM 9.4   CBG (last 3)  No results for input(s): GLUCAP in the last 72 hours.  Wt Readings from Last 3 Encounters:  12/25/17 57 kg (125 lb 10.6 oz)  12/04/17 56.7 kg (125 lb)  07/18/15 56.7 kg (125 lb)    Physical Exam:  Nursing note and vitals reviewed. Constitutional: She appears well-developed and well-nourished. No distress.  HENT: Normocephalic and atraumatic.  Eyes: No discharge.  EOMI. is able to shake hands on the right side when hand is presented, ignores left hand Cardiovascular: RRR. No JVD  Respiratory: Normal effort.  Clear. GI: Bowel sounds are normal. She exhibits no distension.  Musculoskeletal: No edema or tenderness in extremities  Neurological: Alert.  Reduced naming unable to name water bottle or straw , memory deficits.  Limited in sight and awareness Motor: Bilateral upper extremities: 5/5 proximal to distal Left lower extremity: Hip flexion 4-4+/5, distally  5/5 Right lower extremity: Hip flexion 4--4/5, knee extension 4+/5, ankle dorsi/plantar flexion 4+/5  Skin: Skin is warm and dry. She is not diaphoretic.  Psychiatric: Pleasant and cooperative   Assessment/Plan: 1.  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 Physiatrist is providing close team supervision and 24 hour management of active medical problems listed below. Physiatrist and rehab team continue to assess barriers to discharge/monitor patient progress toward functional and medical goals.  Function:  Bathing Bathing position   Position: Wheelchair/chair at sink  Bathing parts Body parts bathed by patient: (UB only) Body parts bathed by helper: Right arm, Left arm, Chest, Abdomen  Bathing assist Assist Level: (total A)      Upper Body Dressing/Undressing Upper body dressing   What is the patient wearing?: Bra, Pull over shirt/dress Bra - Perfomed by patient: Thread/unthread left bra strap, Hook/unhook bra (pull down sports bra) Bra - Perfomed by helper: Thread/unthread right bra strap Pull over shirt/dress - Perfomed by patient: Thread/unthread left sleeve, Put head through opening, Pull shirt over trunk, Thread/unthread right sleeve Pull over shirt/dress - Perfomed by helper: Thread/unthread right sleeve        Upper body assist Assist Level: Touching or steadying assistance(Pt > 75%)   Set up : To obtain clothing/put away  Lower Body Dressing/Undressing Lower body dressing   What is the patient wearing?: Pants, Shoes     Pants- Performed by patient: Thread/unthread left pants leg, Pull pants up/down  Pants- Performed by helper: Thread/unthread right pants leg   Non-skid slipper socks- Performed by helper: Don/doff right sock, Don/doff left sock     Shoes - Performed by patient: Don/doff right shoe, Don/doff left shoe Shoes - Performed by  helper: Don/doff right shoe, Don/doff left shoe          Lower body assist Assist for lower body dressing: (max A)      Toileting Toileting Toileting activity did not occur: No continent bowel/bladder event Toileting steps completed by patient: Adjust clothing prior to toileting, Performs perineal hygiene, Adjust clothing after toileting Toileting steps completed by helper: Adjust clothing prior to toileting, Performs perineal hygiene, Adjust clothing after toileting Toileting Assistive Devices: Grab bar or rail  Toileting assist Assist level: Touching or steadying assistance (Pt.75%)   Transfers Chair/bed transfer Chair/bed transfer activity did not occur: Safety/medical concerns Chair/bed transfer method: Ambulatory Chair/bed transfer assist level: Touching or steadying assistance (Pt > 75%) Chair/bed transfer assistive device: Armrests     Locomotion Ambulation     Max distance: 150 ft Assist level: Supervision or verbal cues   Wheelchair Wheelchair activity did not occur: Safety/medical concerns     Assist Level: Dependent (Pt equals 0%)(for transport)  Cognition Comprehension Comprehension assist level: Understands basic 50 - 74% of the time/ requires cueing 25 - 49% of the time  Expression Expression assist level: Expresses basic 50 - 74% of the time/requires cueing 25 - 49% of the time. Needs to repeat parts of sentences.  Social Interaction Social Interaction assist level: Interacts appropriately 50 - 74% of the time - May be physically or verbally inappropriate.  Problem Solving Problem solving assist level: Solves basic less than 25% of the time - needs direction nearly all the time or does not effectively solve problems and may need a restraint for safety  Memory Memory assist level: Recognizes or recalls 25 - 49% of the time/requires cueing 50 - 75% of the time   Medical Problem List and Plan: 1.  Deficits with mobility, transfers, self-care, cognition secondary to  Balint's syndrome. Also has fluent aphasia with anomia   Continue CIR PT, OT, speech.   2.  DVT Prophylaxis/Anticoagulation: Pharmaceutical: Lovenox    Dopplers negative 3. Severe low back pain/Pain Management: tylenol prn, no current pain complaints   Kpad, Lidoderm patch 4. Mood: Team to provide ego support. LCSW to follow with patient and husband for support.   5. Neuropsych: This patient is not capable of making decisions on her own behalf.   In low bed currently 6. Skin/Wound Care: routine pressure relief measures.  7. Fluids/Electrolytes/Nutrition: Monitor I/O. Offer supplements. Assist with meals due to visual deficits.   intake improved to 800 mL yesterday   BMP within acceptable range on 2/28 8. HTN: Monitor BP bid     Vitals:   12/25/17 0111 12/25/17 0839  BP: 140/60 (!) 155/61  Pulse: 79   Resp: 16   Temp: 98.4 F (36.9 C)   SpO2: 99%   start low-dose ACE inhibitor 12/24/2017, monitor blood pressure 9. Fever: Resolved   BC X 2 neg and final.  2/13 UCS with multispecies, chest x-ray reviewed and is negative   Dopplers negative 10.  Acute blood loss anemia   Hemoglobin 10.8 on 2/26    11. Urinary incontinence: Improving   Recent PVRs negative per nursing    Urecholine DC'd on 2/27  LOS (Days) 18 A FACE TO FACE EVALUATION WAS PERFORMED  Charlett Blake, MD 12/25/2017 10:14 AM

## 2017-12-26 ENCOUNTER — Inpatient Hospital Stay (HOSPITAL_COMMUNITY): Payer: Medicare Other | Admitting: Occupational Therapy

## 2017-12-26 ENCOUNTER — Inpatient Hospital Stay (HOSPITAL_COMMUNITY): Payer: Medicare Other

## 2017-12-26 ENCOUNTER — Inpatient Hospital Stay (HOSPITAL_COMMUNITY): Payer: Medicare Other | Admitting: Speech Pathology

## 2017-12-26 NOTE — Progress Notes (Signed)
Occupational Therapy Session Note  Patient Details  Name: Tammy Arnold MRN: [id number] Date of Birth: 06-01-1941  Today's Date: 12/26/2017 OT Individual Time: 1445-1515 OT Individual Time Calculation (min): 30 min    Short Term Goals: Week 2:  OT Short Term Goal 1 (Week 2): Pt will maintain sustained attention to functional task for 4 minutes with mod multimodal cues OT Short Term Goal 2 (Week 2): Pt will complete 1 grooming task in standing to improve balance  OT Short Term Goal 3 (Week 2): Pt will complete UB dressing with Mod A during 2 ADL sessions   Skilled Therapeutic Interventions/Progress Updates:    Upon entering the room, pt seated in wheelchair awaiting therapist with 2/10 c/o pain and grimaces when standing but reports, " it's alright." Pt ambulating to bathroom with steady assistance and performed toileting with steady assistance for hygiene and clothing management. Pt ambulating to ADL apartment without AD and overall close supervision with verbal and directional cues to navigate. Pt taking seated rest break in recliner chair. Pt standing from rocking recliner with overall supervision. Pt engaged in homemaking task of making up bed and putting pillow cases on. Pt ambulates around bed with supervision - steady assistance for balance and able to complete task with increased time and mod multimodal cues for sequencing and problem solving. Pt requiring assistance to place pillow into cases but able to finish task with increased time. Pt returning to room at end of session. Pt seated in wheelchair with quick release belt donned and chair alarm on. Mozart played for calming effect and pt remained in wheelchair as therapist exited the room.   Therapy Documentation Precautions:  Precautions Precautions: Fall Precaution Comments: impaired vision Restrictions Weight Bearing Restrictions: No General:   Vital Signs: Therapy Vitals Temp: 98.7 F (37.1 C) Temp Source: Oral Pulse  Rate: 74 Resp: 16 BP: (!) 127/59 Patient Position (if appropriate): Sitting Oxygen Therapy SpO2: 100 % O2 Device: Room Air Pain: Pain Assessment Pain Score: 0-No pain ADL:   Vision   Perception    Praxis   Exercises:   Other Treatments:    See Function Navigator for Current Functional Status.   Therapy/Group: Individual Therapy  Gypsy Decant 12/26/2017, 4:21 PM

## 2017-12-26 NOTE — NC FL2 (Signed)
[place] LEVEL OF CARE SCREENING TOOL     IDENTIFICATION  Patient Name: Tammy Arnold Birthdate: December 12, 1940 Sex: female Admission Date (Current Location): 12/07/2017  [facility] and [place] Number:  Herbalist and Address:  [facility] [place] [address], [place], [place] [address]      Provider Number: [id number]  Attending Physician Name and Address:  Meredith Staggers, MD  Relative Name and Phone Number:       Current Level of Care: Other (Comment)(Acute Inpatient Rehab) Recommended Level of Care: [place] Prior Approval Number:    Date Approved/Denied:   PASRR Number: [id number]  Discharge Plan: SNF    Current Diagnoses: Patient Active Problem List   Diagnosis Date Noted  . Absence of bladder continence   . Full incontinence of feces   . Small vessel disease, cerebrovascular   . 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 FUO (fever of unknown origin)   . Ataxia 12/05/2017  . Cognitive impairment   . Abnormal MRI   . Blurry vision   . Hypokalemia   . Acute blood loss anemia   . TIA (transient ischemic attack) 12/04/2017  . Essential hypertension 12/04/2017  . HLD (hyperlipidemia) 12/04/2017    Orientation RESPIRATION BLADDER Height & Weight     Self  Normal Incontinent Weight: 58 kg (127 lb 13.9 oz) Height:  5\' 4"  (162.6 cm)  BEHAVIORAL SYMPTOMS/MOOD NEUROLOGICAL BOWEL NUTRITION STATUS      Incontinent Diet(regular)  AMBULATORY STATUS COMMUNICATION OF NEEDS Skin   Limited Assist Verbally Normal                       Personal Care Assistance Level of Assistance  Bathing, Feeding, Dressing Bathing Assistance: Limited assistance Feeding assistance: Limited assistance Dressing Assistance: Limited assistance     Functional Limitations Info             SPECIAL CARE FACTORS FREQUENCY  PT (By licensed PT), OT (By licensed OT), Bowel and bladder program, Speech therapy     PT Frequency:  5x/wk OT Frequency: 5x/wk Bowel and Bladder Program Frequency: qd   Speech Therapy Frequency: 5x/wk      Contractures Contractures Info: Not present    Additional Factors Info  Code Status, Allergies Code Status Info: Full Allergies Info: baclofen, keflex, sulfa           Current Medications (12/26/2017):  This is the current hospital active medication list Current Facility-Administered Medications  Medication Dose Route Frequency Provider Last Rate Last Dose  . acetaminophen (TYLENOL) tablet 650 mg  650 mg Oral TID WC & HS Bary Leriche, PA-C   650 mg at 12/26/17 1217  . alum & mag hydroxide-simeth (MAALOX/MYLANTA) 200-200-20 MG/5ML suspension 30 mL  30 mL Oral Q4H PRN Love, Pamela S, PA-C      . aspirin chewable tablet 81 mg  81 mg Oral Daily Meredith Staggers, MD   81 mg at 12/26/17 8182  . bisacodyl (DULCOLAX) suppository 10 mg  10 mg Rectal Daily PRN Bary Leriche, PA-C   10 mg at 12/11/17 0646  . diphenhydrAMINE (BENADRYL) 12.5 MG/5ML elixir 12.5-25 mg  12.5-25 mg Oral Q6H PRN Bary Leriche, PA-C   25 mg at 12/11/17 9937  . enoxaparin (LOVENOX) injection 40 mg  40 mg Subcutaneous Q24H Reesa Chew S, PA-C   40 mg at 12/26/17 0827  . guaiFENesin-dextromethorphan (ROBITUSSIN DM) 100-10 MG/5ML syrup 5-10 mL  5-10 mL  Oral Q6H PRN Bary Leriche, PA-C      . lidocaine (LIDODERM) 5 % 1 patch  1 patch Transdermal Q24H Bary Leriche, PA-C   1 patch at 12/26/17 0545  . lisinopril (PRINIVIL,ZESTRIL) tablet 2.5 mg  2.5 mg Oral Daily Kirsteins, Luanna Salk, MD   2.5 mg at 12/26/17 0827  . multivitamin with minerals tablet 1 tablet  1 tablet Oral Daily Bary Leriche, PA-C   1 tablet at 12/26/17 2585  . polyethylene glycol (MIRALAX / GLYCOLAX) packet 17 g  17 g Oral Daily PRN Bary Leriche, PA-C   17 g at 12/24/17 2778  . pravastatin (PRAVACHOL) tablet 20 mg  20 mg Oral q1800 Bary Leriche, PA-C   20 mg at 12/25/17 1733  . prochlorperazine (COMPAZINE) tablet 5-10 mg  5-10 mg Oral Q6H PRN  Love, Pamela S, PA-C       Or  . prochlorperazine (COMPAZINE) injection 5-10 mg  5-10 mg Intramuscular Q6H PRN Love, Pamela S, PA-C       Or  . prochlorperazine (COMPAZINE) suppository 12.5 mg  12.5 mg Rectal Q6H PRN Love, Pamela S, PA-C      . sodium phosphate (FLEET) 7-19 GM/118ML enema 1 enema  1 enema Rectal Once PRN Love, Pamela S, PA-C      . traZODone (DESYREL) tablet 25-50 mg  25-50 mg Oral QHS PRN Bary Leriche, PA-C   50 mg at 12/25/17 2010  . vitamin B-12 (CYANOCOBALAMIN) tablet 500 mcg  500 mcg Oral Daily Meredith Staggers, MD   500 mcg at 12/26/17 2423     Discharge Medications: Please see discharge summary for a list of discharge medications.  Relevant Imaging Results:  Relevant Lab Results:   Additional Information SS# [id number];  pt has quick release belt in w/c for reminder only due to cognitive impairments.    Breylen Agyeman, LCSW

## 2017-12-26 NOTE — Plan of Care (Signed)
  Not Progressing RH SAFETY RH STG ADHERE TO SAFETY PRECAUTIONS W/ASSISTANCE/DEVICE Description STG Adhere to Safety Precautions With max Assistance/Device.  12/26/2017 0115 - Not Progressing by Cornell Barman, RN RH COGNITION-NURSING RH STG USES MEMORY AIDS/STRATEGIES W/ASSIST TO PROBLEM SOLVE Description STG Uses Memory Aids/Strategies With max Assistance to Problem Solve.  12/26/2017 0115 - Not Progressing by Cornell Barman, RN RH STG ANTICIPATES NEEDS/CALLS FOR ASSIST W/ASSIST/CUES Description STG Anticipates Needs/Calls for Assist With max Assistance/Cues.  12/26/2017 0115 - Not Progressing by Cornell Barman, RN

## 2017-12-26 NOTE — Progress Notes (Signed)
Physical Therapy Session Note  Patient Details  Name: Tammy Arnold MRN: [id number] Date of Birth: 1940/12/16  Today's Date: 12/26/2017 PT Individual Time: 5885-0277 PT Individual Time Calculation (min): 55 min   Short Term Goals: Week 3:  PT Short Term Goal 1 (Week 3): STG = LTG due to anticipated d/c to SNF.  Skilled Therapeutic Interventions/Progress Updates:    Pt seated in w/c upon PT arrival, agreeable to therapy tx and denies pain. Pt transported to gym in w/c. Pt ambulated within the unit>500 ft with supervision, working on scanning environment to name objects of a certain color. Pt worked on cognition and balance in order to perform card matching activity, pt able to correctly name cards 80% of the time and find the correct match 50% of the time, otherwise requiring cueing. Pt ambulated to rehab apartment and worked on finding appliances and finding items within the pantry, verbal cues as needed to attention and awareness. Pt ambulated to the dayroom with supervision. Pt used nustep on workload 5 x 6 minutes for global strengthening. Pt ambulated back to room and left seated in w/c with needs in reach, QRB in place and chair alarm set.   Therapy Documentation Precautions:  Precautions Precautions: Fall Precaution Comments: impaired vision Restrictions Weight Bearing Restrictions: No   See Function Navigator for Current Functional Status.   Therapy/Group: Individual Therapy  Netta Corrigan, PT, DPT 12/26/2017, 12:19 PM

## 2017-12-26 NOTE — Progress Notes (Signed)
Speech Language Pathology Daily Session Note  Patient Details  Name: Tammy Arnold MRN: [id number] Date of Birth: 1940-12-15  Today's Date: 12/26/2017 SLP Individual Time: 0830-0930 SLP Individual Time Calculation (min): 60 min  Short Term Goals: Week 3: SLP Short Term Goal 1 (Week 3): Patient will visually scan to locate specific items during functional tasks with Mod A multimodal cues.  SLP Short Term Goal 2 (Week 3): Patient will demonstrate selective attention to functional tasks for ~10 minutes with Min A verbal cues for redirection in a moderately distracting enviornment.  SLP Short Term Goal 3 (Week 3): Patient will self-monitor and correct errors during functional tasks with Max A multimodal cues.  SLP Short Term Goal 4 (Week 3): Patient will demonstrate functional problem solving with functional and familair tasks with Max A multimodal cues.  SLP Short Term Goal 5 (Week 3): Patient will utilize exernal aids for orientation to place and time with Max A multimodal cues.   Skilled Therapeutic Interventions: Skilled treatment session focused on cognition goals. SLP facilitated session by providing Max A cues to visually scan for single item in file cabinet, retrieve it, place specified items in folder and return to file cabinet. Out of 12 trials, pt with insight x 2 when placed incorrectly but unable to visually problem solve error. Pt with several comments that she was fearful her Husband was going to leave" her. She further stated "you know that he is so much smarted than me even before this."  Support given by SLP (revieweed that pt had degree in mechanical engineering etc) but pt was not receptive of support as she states "he is smarter than I am and now this too." Also SLP passed pt's daughter in hall and reviewed session with daughter. SLP commented that "pt was delightful" - daughter commented "she used to be." SLP notified CSW of comments for further support. Pt left upright in  wheelchair with safety belt in place.      Function:    Cognition Comprehension Comprehension assist level: Understands basic 25 - 49% of the time/ requires cueing 50 - 75% of the time  Expression   Expression assist level: Expresses basic 50 - 74% of the time/requires cueing 25 - 49% of the time. Needs to repeat parts of sentences.  Social Interaction Social Interaction assist level: Interacts appropriately 50 - 74% of the time - May be physically or verbally inappropriate.  Problem Solving Problem solving assist level: Solves basic less than 25% of the time - needs direction nearly all the time or does not effectively solve problems and may need a restraint for safety  Memory Memory assist level: Recognizes or recalls less than 25% of the time/requires cueing greater than 75% of the time    Pain Pain Assessment Pain Assessment: No/denies pain Pain Score: 0-No pain Pain Intervention(s): Medication (See eMAR)  Therapy/Group: Individual Therapy  Breslin Hemann 12/26/2017, 12:22 PM

## 2017-12-26 NOTE — Progress Notes (Signed)
[facility] PH[facility]ICAL [facility]ICINE & REHABILITATION     PROGRESS NOTE    Subjective/Complaints: No new issues overnight.   ROS: Limited due to cognitive/behavioral   Objective: Vital Signs: Blood pressure (!) 136/55, pulse 77, temperature 98.9 F (37.2 C), temperature source Oral, resp. rate 16, height 5\' 4"  (1.626 m), weight 58 kg (127 lb 13.9 oz), SpO2 100 %. Vas [place] Lower Extremity Venous (dvt)  Result Date: 12/09/2017  Lower Venous Study Indication: Swelling. Examination Guidelines: A complete evaluation includes B-mode imaging, spectral doppler, color doppler, and power doppler as needed of all accessible portions of each vessel. Bilateral testing is considered an integral part of a complete examination. Limited examinations for reoccurring indications may be performed as noted.  Right Venous Findings: +---------+---------------+---------+-----------+----------+-------+          CompressibilityPhasicitySpontaneityPropertiesSummary +---------+---------------+---------+-----------+----------+-------+ CFV      Full           Yes      Yes                          +---------+---------------+---------+-----------+----------+-------+ FV Prox  Full                                                 +---------+---------------+---------+-----------+----------+-------+ FV Mid   Full                                                 +---------+---------------+---------+-----------+----------+-------+ FV DistalFull                                                 +---------+---------------+---------+-----------+----------+-------+ PFV      Full                                                 +---------+---------------+---------+-----------+----------+-------+ POP      Full           Yes      Yes                          +---------+---------------+---------+-----------+----------+-------+ PTV      Full                                                  +---------+---------------+---------+-----------+----------+-------+ PERO     Full                                                 +---------+---------------+---------+-----------+----------+-------+ GSV      Full                                                 +---------+---------------+---------+-----------+----------+-------+  SSV      Full                                                 +---------+---------------+---------+-----------+----------+-------+  Left Venous Findings: +---------+---------------+---------+-----------+----------+-------+          CompressibilityPhasicitySpontaneityPropertiesSummary +---------+---------------+---------+-----------+----------+-------+ CFV      Full           Yes      Yes                          +---------+---------------+---------+-----------+----------+-------+ FV Prox  Full                                                 +---------+---------------+---------+-----------+----------+-------+ FV Mid   Full                                                 +---------+---------------+---------+-----------+----------+-------+ FV DistalFull                                                 +---------+---------------+---------+-----------+----------+-------+ PFV      Full                                                 +---------+---------------+---------+-----------+----------+-------+ POP      Full           Yes      Yes                          +---------+---------------+---------+-----------+----------+-------+ PTV      Full                                                 +---------+---------------+---------+-----------+----------+-------+ PERO     Full                                                 +---------+---------------+---------+-----------+----------+-------+ GSV      Full                                                  +---------+---------------+---------+-----------+----------+-------+ SSV      Full                                                 +---------+---------------+---------+-----------+----------+-------+  Final Interpretation: Right: There is no evidence of deep vein thrombosis in the lower extremity.There is no evidence of superficial venous thrombosis. No cystic structure found in the popliteal fossa. Left: There is no evidence of deep vein thrombosis in the lower extremity.There is no evidence of superficial venous thrombosis. No cystic structure found in the popliteal fossa.  *See table(s) above for measurements and observations. Electronically signed by Deitra Mayo on 12/09/2017 at 3:22:49 PM.     No results for input(s): WBC, HGB, HCT, PLT in the last 72 hours. No results for input(s): NA, K, CL, GLUCOSE, BUN, CREATININE, CALCIUM in the last 72 hours.  Invalid input(s): CO CBG (last 3)  No results for input(s): GLUCAP in the last 72 hours.  Wt Readings from Last 3 Encounters:  12/26/17 58 kg (127 lb 13.9 oz)  12/04/17 56.7 kg (125 lb)  07/18/15 56.7 kg (125 lb)    Physical Exam:  Nursing note and vitals reviewed. Constitutional: She appears well-developed and well-nourished. No distress.  HENT: Normocephalic and atraumatic.  Eyes: No discharge.  EOMI grossly Cardiovascular: RRR without murmur. No JVD  Respiratory: CTA Bilaterally without wheezes or rales. Normal effort . GI: Bowel sounds are normal. She exhibits no distension.  Musculoskeletal: No edema or tenderness in extremities  Neurological: Alert.  Ongoing severe deficits with initiation and language.  Motor: Bilateral upper extremities: 5/5 proximal to distal Left lower extremity: Hip flexion 4-4+/5, distally 5/5 Right lower extremity: Hip flexion 4--4/5, knee extension 4+/5, ankle dorsi/plantar flexion 4+/5 ---stable exam Skin: Skin is warm and dry. She is not diaphoretic.  Psychiatric: Pleasant and  cooperative   Assessment/Plan: 1.  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 Physiatrist is providing close team supervision and 24 hour management of active medical problems listed below. Physiatrist and rehab team continue to assess barriers to discharge/monitor patient progress toward functional and medical goals.  Function:  Bathing Bathing position   Position: Wheelchair/chair at sink  Bathing parts Body parts bathed by patient: (UB only) Body parts bathed by helper: Right arm, Left arm, Chest, Abdomen  Bathing assist Assist Level: (total A)      Upper Body Dressing/Undressing Upper body dressing   What is the patient wearing?: Bra, Pull over shirt/dress Bra - Perfomed by patient: Thread/unthread left bra strap, Hook/unhook bra (pull down sports bra) Bra - Perfomed by helper: Thread/unthread right bra strap Pull over shirt/dress - Perfomed by patient: Thread/unthread left sleeve, Put head through opening, Pull shirt over trunk, Thread/unthread right sleeve Pull over shirt/dress - Perfomed by helper: Thread/unthread right sleeve        Upper body assist Assist Level: Touching or steadying assistance(Pt > 75%)   Set up : To obtain clothing/put away  Lower Body Dressing/Undressing Lower body dressing   What is the patient wearing?: Pants, Shoes     Pants- Performed by patient: Thread/unthread left pants leg, Pull pants up/down Pants- Performed by helper: Thread/unthread right pants leg   Non-skid slipper socks- Performed by helper: Don/doff right sock, Don/doff left sock     Shoes - Performed by patient: Don/doff right shoe, Don/doff left shoe Shoes - Performed by helper: Don/doff right shoe, Don/doff left shoe          Lower body assist Assist for lower body dressing: (max A)      Toileting Toileting Toileting activity did not occur: No  continent bowel/bladder event Toileting steps  completed by patient: Performs perineal hygiene, Adjust clothing after toileting, Adjust clothing prior to toileting Toileting steps completed by helper: Adjust clothing prior to toileting, Performs perineal hygiene, Adjust clothing after toileting Toileting Assistive Devices: Grab bar or rail  Toileting assist Assist level: Touching or steadying assistance (Pt.75%)   Transfers Chair/bed transfer Chair/bed transfer activity did not occur: Safety/medical concerns Chair/bed transfer method: Ambulatory Chair/bed transfer assist level: Touching or steadying assistance (Pt > 75%) Chair/bed transfer assistive device: Armrests     Locomotion Ambulation     Max distance: 150 ft Assist level: Supervision or verbal cues   Wheelchair Wheelchair activity did not occur: Safety/medical concerns     Assist Level: Dependent (Pt equals 0%)(for transport)  Cognition Comprehension Comprehension assist level: Understands basic 25 - 49% of the time/ requires cueing 50 - 75% of the time  Expression Expression assist level: Expresses basic 50 - 74% of the time/requires cueing 25 - 49% of the time. Needs to repeat parts of sentences.  Social Interaction Social Interaction assist level: Interacts appropriately 50 - 74% of the time - May be physically or verbally inappropriate.  Problem Solving Problem solving assist level: Solves basic less than 25% of the time - needs direction nearly all the time or does not effectively solve problems and may need a restraint for safety  Memory Memory assist level: Recognizes or recalls less than 25% of the time/requires cueing greater than 75% of the time   Medical Problem List and Plan: 1.  Deficits with mobility, transfers, self-care, cognition secondary to Balint's syndrome.    -Continue CIR PT, OT, speech.  SNF pending 2.  DVT Prophylaxis/Anticoagulation: Pharmaceutical: Lovenox    Dopplers negative 3. Severe low back  pain/Pain Management: tylenol prn, no current pain complaints   Kpad, Lidoderm patch 4. Mood: Team to provide ego support. LCSW to follow with patient and husband for support.   5. Neuropsych: This patient is not capable of making decisions on her own behalf.   In low bed currently 6. Skin/Wound Care: routine pressure relief measures.  7. Fluids/Electrolytes/Nutrition: Monitor I/O. Offer supplements. Assist with meals due to visual deficits.   intake improved somewhat, will need cueing for nutrition   BMP within acceptable range on 2/28 8. HTN: Monitor BP bid     Vitals:   12/26/17 0615 12/26/17 0827  BP: (!) 139/58 (!) 136/55  Pulse: 77   Resp: 16   Temp: 98.9 F (37.2 C)   SpO2: 100%   started ow-dose ACE inhibitor 12/24/2017 with improvement 9. Fever: Resolved   BC X 2 neg and final.  2/13 UCS with multispecies, chest x-ray reviewed and is negative   Dopplers negative 10.  Acute blood loss anemia   Hemoglobin 10.8 on 2/26    11. Urinary incontinence: Improving   Recent PVRs negative per nursing    Urecholine DC'd on 2/27  LOS (Days) 19 A FACE TO FACE EVALUATION WAS PERFORMED  Meredith Staggers, MD 12/26/2017 8:38 AM

## 2017-12-26 NOTE — Progress Notes (Addendum)
Occupational Therapy Session Note  Patient Details  Name: Tammy Arnold MRN: [id number] Date of Birth: 1940-10-31  Today's Date: 12/26/2017 OT Individual Time: 1030-1130 OT Individual Time Calculation (min): 60 min   Skilled Therapeutic Interventions/Progress Updates:    Pt greeted at RN station, reported feeling confused and visibly agitated. Wanted to change out of her pajamas. Escorted pt back to room and had her pick out clothing items with mod cuing. Pt proceeding to dress w/c level at sink with Min A for bra, and max multimodal cues for orientation, sequencing, self organizing, and problem solving while continuing to dress. LB self care completed with steady assist today! She then completed oral care/grooming tasks while standing at sink with similar cues provided. Dtr Tammy Arnold was present to observe. Pt then ambulated to dayroom with Min A. Able to identify cars and trees outside. Max A for orientation x3. Provided dtr with hands on practice/training when ambulating with pt around unit and room. Cleared her on safety plan to provide steady assist during ambulation/toilet transfers. RN made aware. Pt left with dtr, ambulating around unit.    Therapy Documentation Precautions:  Precautions Precautions: Fall Precaution Comments: impaired vision Restrictions Weight Bearing Restrictions: No Pain: No c/o pain during session  Pain Assessment Pain Assessment: No/denies pain Pain Score: 0-No pain Pain Intervention(s): Medication (See eMAR) ADL:     See Function Navigator for Current Functional Status.   Therapy/Group: Individual Therapy  Tammy Arnold 12/26/2017, 12:34 PM

## 2017-12-27 ENCOUNTER — Inpatient Hospital Stay (HOSPITAL_COMMUNITY): Payer: Medicare Other | Admitting: Speech Pathology

## 2017-12-27 ENCOUNTER — Inpatient Hospital Stay (HOSPITAL_COMMUNITY): Payer: Medicare Other | Admitting: Occupational Therapy

## 2017-12-27 ENCOUNTER — Inpatient Hospital Stay (HOSPITAL_COMMUNITY): Payer: Medicare Other | Admitting: Physical Therapy

## 2017-12-27 DIAGNOSIS — R278 Other lack of coordination: Secondary | ICD-10-CM | POA: Diagnosis not present

## 2017-12-27 DIAGNOSIS — R4189 Other symptoms and signs involving cognitive functions and awareness: Secondary | ICD-10-CM | POA: Diagnosis not present

## 2017-12-27 DIAGNOSIS — R270 Ataxia, unspecified: Secondary | ICD-10-CM | POA: Diagnosis not present

## 2017-12-27 DIAGNOSIS — I10 Essential (primary) hypertension: Secondary | ICD-10-CM | POA: Diagnosis not present

## 2017-12-27 DIAGNOSIS — R488 Other symbolic dysfunctions: Secondary | ICD-10-CM | POA: Diagnosis not present

## 2017-12-27 DIAGNOSIS — H538 Other visual disturbances: Secondary | ICD-10-CM | POA: Diagnosis not present

## 2017-12-27 DIAGNOSIS — E2601 Conn's syndrome: Secondary | ICD-10-CM | POA: Diagnosis not present

## 2017-12-27 DIAGNOSIS — M549 Dorsalgia, unspecified: Secondary | ICD-10-CM

## 2017-12-27 DIAGNOSIS — M6281 Muscle weakness (generalized): Secondary | ICD-10-CM | POA: Diagnosis not present

## 2017-12-27 DIAGNOSIS — G3184 Mild cognitive impairment, so stated: Secondary | ICD-10-CM | POA: Diagnosis not present

## 2017-12-27 LAB — CBC
HCT: 34.4 % — ABNORMAL LOW (ref 36.0–46.0)
Hemoglobin: 10.9 g/dL — ABNORMAL LOW (ref 12.0–15.0)
MCH: 27.9 pg (ref 26.0–34.0)
MCHC: 31.7 g/dL (ref 30.0–36.0)
MCV: 88 fL (ref 78.0–100.0)
Platelets: 415 10*3/uL — ABNORMAL HIGH (ref 150–400)
RBC: 3.91 MIL/uL (ref 3.87–5.11)
RDW: 14.5 % (ref 11.5–15.5)
WBC: 9.8 10*3/uL (ref 4.0–10.5)

## 2017-12-27 MED ORDER — LIDOCAINE 5 % EX PTCH: 1.0000 | MEDICATED_PATCH | CUTANEOUS | 0 refills | Status: DC

## 2017-12-27 MED ORDER — ACETAMINOPHEN 325 MG PO TABS: 650.0000 mg | ORAL_TABLET | Freq: Three times a day (TID) | ORAL | Status: DC

## 2017-12-27 MED ORDER — TRAZODONE HCL 50 MG PO TABS: 25.0000 mg | ORAL_TABLET | Freq: Every evening | ORAL | Status: DC | PRN

## 2017-12-27 MED ORDER — LISINOPRIL 2.5 MG PO TABS: 2.5000 mg | ORAL_TABLET | Freq: Every day | ORAL | Status: AC

## 2017-12-27 MED ORDER — ASPIRIN 81 MG PO CHEW: 81.0000 mg | CHEWABLE_TABLET | Freq: Every day | ORAL | Status: DC

## 2017-12-27 NOTE — Progress Notes (Signed)
Social Work Patient ID: Tammy Arnold, female   DOB: 01-24-1941, 77 y.o.   MRN: [id number]   Have received SNF bed offer from [facility] and pt/ spouse have accepted.  Plan to transfer pt to facility today.  Alerting tx team.  Lennart Pall, LCSW

## 2017-12-27 NOTE — Discharge Summary (Signed)
Physician Discharge Summary  Patient ID: Tammy Arnold MRN: [id number] DOB/AGE: 1941/10/02 77 y.o.  Admit date: 12/07/2017 Discharge date: 12/27/2017  Discharge Diagnoses:  Principal Problem:   Balint's syndrome Active Problems:   Essential hypertension   Ataxia   Cognitive impairment   Blurry vision   Small vessel disease, cerebrovascular   Back pain   Discharged Condition: stable   Significant Diagnostic Studies: Dg Lumbar Spine 2-3 Views  Result Date: 12/07/2017 CLINICAL DATA:  Low back pain EXAM: LUMBAR SPINE - 2-3 VIEW COMPARISON:  MRI lumbar spine 12/08/2015 FINDINGS: Five non-rib-bearing lumbar vertebra. Partial sacralization of L5 vertebral body. Biconvex thoracolumbar scoliosis. Multilevel disc space narrowing and endplate spur formation. Vertebral body heights maintained without fracture or subluxation. No bone destruction. SI joints preserved. IMPRESSION: Degenerative disc and facet disease changes lumbar spine with associated scoliosis. No acute abnormalities. Electronically Signed   By: Lavonia Dana M.D.   On: 12/07/2017 12:32   Dg Chest Port 1 View  Result Date: 12/07/2017 CLINICAL DATA:  Fever EXAM: PORTABLE CHEST 1 VIEW COMPARISON:  None. FINDINGS: Right rotated chest radiograph. Normal heart size. Normal mediastinal contour. No pneumothorax. No pleural effusion. Lungs appear clear, with no acute consolidative airspace disease and no pulmonary edema. IMPRESSION: No active disease. Electronically Signed   By: Ilona Sorrel M.D.   On: 12/07/2017 08:28    Labs:  Basic Metabolic Panel: BMP Latest Ref Rng & Units 12/22/2017 12/15/2017 12/08/2017  Glucose 65 - 99 mg/dL 89 95 116(H)  BUN 6 - 20 mg/dL 10 14 11   Creatinine 0.44 - 1.00 mg/dL 0.73 0.66 0.69  Sodium 135 - 145 mmol/L 140 141 140  Potassium 3.5 - 5.1 mmol/L 3.9 4.0 3.7  Chloride 101 - 111 mmol/L 105 106 105  CO2 22 - 32 mmol/L 26 24 24   Calcium 8.9 - 10.3 mg/dL 9.4 8.9 8.9    CBC: CBC Latest Ref Rng & Units  12/20/2017 12/13/2017 12/08/2017  WBC 4.0 - 10.5 K/uL 6.8 9.2 10.5  Hemoglobin 12.0 - 15.0 g/dL 10.8(L) 11.2(L) 12.1  Hematocrit 36.0 - 46.0 % 34.5(L) 35.5(L) 36.3  Platelets 150 - 400 K/uL 505(H) 379 269    CBG: No results for input(s): GLUCAP in the last 168 hours.  Brief HPI:   Tammy Arnold a 77 y.o.femalewith history of HTN, progressive cognitive decline over 2-3 years,  blurry vision -- recent cataract surgery left eye.History taken from chart review. She was admitted on 2/10/19withfall and difficulty talking. CThead was suggestive of cerebellar abnormality. CTAhead/neck showed atherosclerotic changes with distal small vessel disease and remote left occipital lobe infarct --no perfusion deficits. MRI brain reviewed, showing generalized atrophy. Per report,generalized volume loss with ex vacuo dilation of ventricles and sequelae of chronic ischemic microangiopathy.  She continued to have issues with confusion, agitation and visual disturbance.  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 EEG done revealing moderate generalized slowing question due to dementia, toxic/metabolic encephalopathy or infectious etiology   Hospital Course: Tammy Arnold was admitted to rehab 12/07/2017 for inpatient therapies to consist of PT, ST and OT at least three hours five days a week. Past admission physiatrist, therapy team and rehab RN have worked together to provide customized collaborative inpatient rehab. Mentation hass improved with improvement in activity tolerance. Blood cultures done 2/13 am were negative.  UCS was negative for infection and CXR showed NAD.  Lidocaine patch with K pad in evening  was added to help with severe acute low back pain. Tylenol was scheduled on qid to help with pain management. She had issues with urinary retention therefore urecholine was added with  improvement in voiding function. This was discontinued on 2/27 and PVRs reman low. She has had improvement in ability to express basic needs and is now able to maintain bladder and  bowel continence.  She still needs toileting at nights to maintain continence.     BLE dopplers done due to swelling on 2/15 and were negative for DVT.  Nutritional supplements were added to help with poor po intake. She also requires assistance with meals. Serial CBC showed drop in H/H but no signs of bleeding noted. Recommend recheck CBC in 1-2 weeks to monitor for recovery. Check of lytes at admission showed mild hypokalemia which has resolved with brief supplementation.  Insomnia is managed with use of trazodone on prn basis. She has had improvement with mobility but continues to have significant cognitive deficits with fluent aphasia with anomia. Family is unable to provide care needed and has elected on SNF for progressive therapy. She was discharged to [facility] on 12/27/17.   Rehab course: During patient's stay in rehab weekly team conferences were held to monitor patient's progress, set goals and discuss barriers to discharge. At admission, patient required total assist with ADL and mod to max assist with mobility.  She demonstrated severe cognitive impairments impacted by baseline cognitive impairments and apraxia. She  has had improvement in activity tolerance, balance and postural control.  She is able to complete tasks with increased time and multimodal cues for sequencing and problem solving.  She requires max cues to visually scan for items. She is able to express needs with mod to max assist with min to mod cues. She requires max to total assist for problem solving, processing and recall.  She requires in assist for transfers and is able to ambulate up to 500' without AD and with supervision.      Disposition: Skilled Nursing facility.  Diet: Heart Healthy.   Special Instructions: 1. Toilet patient every 3-4  hours while awake 2. Offer supplements between meals and assist with meals.    Discharge Instructions    Ambulatory referral to Physical Medicine Rehab   Complete by:  As directed    3-4 weeks follow up     Allergies as of 12/27/2017      Reactions   Baclofen Other (See Comments)   Caused brain dysfunction   Keflex [cephalexin] Rash   Macrodantin [nitrofurantoin] Rash   Neggram [nalidixic Acid] Rash   Penicillins Rash   Has patient had a PCN reaction causing immediate rash, facial/tongue/throat swelling, SOB or lightheadedness with hypotension: Yes Has patient had a PCN reaction causing severe rash involving mucus membranes or skin necrosis: No Has patient had a PCN reaction that required hospitalization: No Has patient had a PCN reaction occurring within the last 10 years: No If all of the above answers are "NO", then may proceed with Cephalosporin use.   Sulfa Antibiotics Rash      Medication List    STOP taking these medications   DUREZOL 0.05 % Emul Generic drug:  Difluprednate   ofloxacin 0.3 % ophthalmic solution Commonly known as:  OCUFLOX     TAKE these medications   acetaminophen 325 MG tablet Commonly known as:  TYLENOL Take 2 tablets (650 mg total) by mouth 4 (four) times daily -  with meals and at bedtime.   aspirin  81 MG chewable tablet Chew 1 tablet (81 mg total) by mouth daily. Start taking on:  12/28/2017   lidocaine 5 % Commonly known as:  LIDODERM Place 1 patch onto the skin daily. Place on back at 7 am and remove at 7 pm daily What changed:  additional instructions   lisinopril 2.5 MG tablet Commonly known as:  PRINIVIL,ZESTRIL Take 1 tablet (2.5 mg total) by mouth daily. Start taking on:  12/28/2017   multivitamin with minerals Tabs tablet Take 1 tablet by mouth daily.   pravastatin 20 MG tablet Commonly known as:  PRAVACHOL Take 1 tablet (20 mg total) by mouth daily at 6 PM.   traZODone 50 MG tablet Commonly known as:  DESYREL Take 0.5-1  tablets (25-50 mg total) by mouth at bedtime as needed for sleep.   vitamin B-12 250 MCG tablet Commonly known as:  CYANOCOBALAMIN Take 1 tablet (250 mcg total) by mouth daily.       Contact information for follow-up providers    Meredith Staggers, MD Follow up.   Specialty:  Physical Medicine and Rehabilitation Why:  office will call with follow up appointment Contact information: [address] [place] [address] [phone]        Star Age, MD. Call in 1 day(s).   Specialties:  Neurology, Radiology Why:  for follow up appointment Contact information: [place] [address] [phone]            Contact information for after-discharge care    Destination    [facility] Follow up.   Service:  Skilled Nursing Contact information: [address] [place] [phone]                  Signed: Bary Leriche 12/27/2017, 11:08 AM

## 2017-12-27 NOTE — Progress Notes (Signed)
[facility] PHYSICAL [facility]ICINE & REHABILITATION     PROGRESS NOTE    Subjective/Complaints: Patient without problems overnight.  Up with therapy earlier this morning.  ROS: Limited due to cognitive/behavioral   Objective: Vital Signs: Blood pressure 132/60, pulse 74, temperature 99.2 F (37.3 C), temperature source Oral, resp. rate 17, height 5\' 4"  (1.626 m), weight 57.6 kg (127 lb), SpO2 96 %. Vas [place] Lower Extremity Venous (dvt)  Result Date: 12/09/2017  Lower Venous Study Indication: Swelling. Examination Guidelines: A complete evaluation includes B-mode imaging, spectral doppler, color doppler, and power doppler as needed of all accessible portions of each vessel. Bilateral testing is considered an integral part of a complete examination. Limited examinations for reoccurring indications may be performed as noted.  Right Venous Findings: +---------+---------------+---------+-----------+----------+-------+          CompressibilityPhasicitySpontaneityPropertiesSummary +---------+---------------+---------+-----------+----------+-------+ CFV      Full           Yes      Yes                          +---------+---------------+---------+-----------+----------+-------+ FV Prox  Full                                                 +---------+---------------+---------+-----------+----------+-------+ FV Mid   Full                                                 +---------+---------------+---------+-----------+----------+-------+ FV DistalFull                                                 +---------+---------------+---------+-----------+----------+-------+ PFV      Full                                                 +---------+---------------+---------+-----------+----------+-------+ POP      Full           Yes      Yes                          +---------+---------------+---------+-----------+----------+-------+ PTV      Full                                                  +---------+---------------+---------+-----------+----------+-------+ PERO     Full                                                 +---------+---------------+---------+-----------+----------+-------+ GSV      Full                                                 +---------+---------------+---------+-----------+----------+-------+  SSV      Full                                                 +---------+---------------+---------+-----------+----------+-------+  Left Venous Findings: +---------+---------------+---------+-----------+----------+-------+          CompressibilityPhasicitySpontaneityPropertiesSummary +---------+---------------+---------+-----------+----------+-------+ CFV      Full           Yes      Yes                          +---------+---------------+---------+-----------+----------+-------+ FV Prox  Full                                                 +---------+---------------+---------+-----------+----------+-------+ FV Mid   Full                                                 +---------+---------------+---------+-----------+----------+-------+ FV DistalFull                                                 +---------+---------------+---------+-----------+----------+-------+ PFV      Full                                                 +---------+---------------+---------+-----------+----------+-------+ POP      Full           Yes      Yes                          +---------+---------------+---------+-----------+----------+-------+ PTV      Full                                                 +---------+---------------+---------+-----------+----------+-------+ PERO     Full                                                 +---------+---------------+---------+-----------+----------+-------+ GSV      Full                                                  +---------+---------------+---------+-----------+----------+-------+ SSV      Full                                                 +---------+---------------+---------+-----------+----------+-------+  Final Interpretation: Right: There is no evidence of deep vein thrombosis in the lower extremity.There is no evidence of superficial venous thrombosis. No cystic structure found in the popliteal fossa. Left: There is no evidence of deep vein thrombosis in the lower extremity.There is no evidence of superficial venous thrombosis. No cystic structure found in the popliteal fossa.  *See table(s) above for measurements and observations. Electronically signed by Deitra Mayo on 12/09/2017 at 3:22:49 PM.     No results for input(s): WBC, HGB, HCT, PLT in the last 72 hours. No results for input(s): NA, K, CL, GLUCOSE, BUN, CREATININE, CALCIUM in the last 72 hours.  Invalid input(s): CO CBG (last 3)  No results for input(s): GLUCAP in the last 72 hours.  Wt Readings from Last 3 Encounters:  12/27/17 57.6 kg (127 lb)  12/04/17 56.7 kg (125 lb)  07/18/15 56.7 kg (125 lb)    Physical Exam:  Nursing note and vitals reviewed. Constitutional: She appears well-developed and well-nourished. No distress.  HENT: Normocephalic and atraumatic.  Eyes: No discharge.  EOMI grossly Cardiovascular:RRR without murmur. No JVD   Respiratory: CTA Bilaterally without wheezes or rales. Normal effort  GI: Bowel sounds are normal. She exhibits no distension.  Musculoskeletal: No edema or tenderness in extremities  Neurological: Alert.  Ongoing severe deficits with initiation and language.  Motor: Bilateral upper extremities: 5/5 proximal to distal Left lower extremity: Hip flexion 4-4+/5, distally 5/5 Right lower extremity: 4/5 prox to distal Skin: Skin is warm and dry. She is not diaphoretic.  Psychiatric: Pleasant and cooperative   Assessment/Plan: 1.  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 Physiatrist is providing close team supervision and 24 hour management of active medical problems listed below. Physiatrist and rehab team continue to assess barriers to discharge/monitor patient progress toward functional and medical goals.  Function:  Bathing Bathing position   Position: Wheelchair/chair at sink  Bathing parts Body parts bathed by patient: (UB only) Body parts bathed by helper: Right arm, Left arm, Chest, Abdomen  Bathing assist Assist Level: (total A)      Upper Body Dressing/Undressing Upper body dressing   What is the patient wearing?: Bra, Pull over shirt/dress Bra - Perfomed by patient: Thread/unthread right bra strap, Thread/unthread left bra strap Bra - Perfomed by helper: Hook/unhook bra (pull down sports bra) Pull over shirt/dress - Perfomed by patient: Thread/unthread left sleeve, Put head through opening, Pull shirt over trunk, Thread/unthread right sleeve Pull over shirt/dress - Perfomed by helper: Thread/unthread right sleeve        Upper body assist Assist Level: Touching or steadying assistance(Pt > 75%)   Set up : To obtain clothing/put away  Lower Body Dressing/Undressing Lower body dressing   What is the patient wearing?: Pants, Non-skid slipper socks     Pants- Performed by patient: Thread/unthread left pants leg, Pull pants up/down, Thread/unthread right pants leg Pants- Performed by helper: Thread/unthread right pants leg Non-skid slipper socks- Performed by patient: Don/doff right sock, Don/doff left sock Non-skid slipper socks- Performed by helper: Don/doff right sock, Don/doff left sock     Shoes - Performed by patient: Don/doff right shoe, Don/doff left shoe Shoes - Performed by helper: Don/doff right shoe, Don/doff left shoe          Lower body assist Assist for lower body dressing: Touching or steadying  assistance (Pt > 75%)      Toileting Toileting  Toileting activity did not occur: No continent bowel/bladder event Toileting steps completed by patient: Performs perineal hygiene, Adjust clothing after toileting, Adjust clothing prior to toileting Toileting steps completed by helper: Adjust clothing prior to toileting, Performs perineal hygiene, Adjust clothing after toileting Toileting Assistive Devices: Grab bar or rail  Toileting assist Assist level: Touching or steadying assistance (Pt.75%)   Transfers Chair/bed transfer Chair/bed transfer activity did not occur: Safety/medical concerns Chair/bed transfer method: Ambulatory Chair/bed transfer assist level: Touching or steadying assistance (Pt > 75%) Chair/bed transfer assistive device: Armrests     Locomotion Ambulation     Max distance: 150 ft Assist level: Supervision or verbal cues   Wheelchair Wheelchair activity did not occur: Safety/medical concerns     Assist Level: Dependent (Pt equals 0%)(for transport)  Cognition Comprehension Comprehension assist level: Understands basic 25 - 49% of the time/ requires cueing 50 - 75% of the time  Expression Expression assist level: Expresses basic 50 - 74% of the time/requires cueing 25 - 49% of the time. Needs to repeat parts of sentences.  Social Interaction Social Interaction assist level: Interacts appropriately 50 - 74% of the time - May be physically or verbally inappropriate.  Problem Solving Problem solving assist level: Solves basic less than 25% of the time - needs direction nearly all the time or does not effectively solve problems and may need a restraint for safety  Memory Memory assist level: Recognizes or recalls less than 25% of the time/requires cueing greater than 75% of the time   Medical Problem List and Plan: 1.  Deficits with mobility, transfers, self-care, cognition secondary to Balint's syndrome.    -Continue CIR PT, OT, speech.  SNF pending 2.  DVT  Prophylaxis/Anticoagulation: Pharmaceutical: Lovenox    Dopplers negative 3. Severe low back pain/Pain Management: tylenol prn, no current pain complaints   Kpad, Lidoderm patch 4. Mood: Team to provide ego support. LCSW to follow with patient and husband for support.   5. Neuropsych: This patient is not capable of making decisions on her own behalf.   In low bed currently 6. Skin/Wound Care: routine pressure relief measures.  7. Fluids/Electrolytes/Nutrition: Monitor I/O. Offer supplements. Assist with meals due to visual deficits.   Needs cueing for adequate oral intake but appears stable at present   BMP within acceptable range on 2/28 8. HTN: Monitor BP bid     Vitals:   12/26/17 1417 12/27/17 0440  BP: (!) 127/59 132/60  Pulse: 74 74  Resp: 16 17  Temp: 98.7 F (37.1 C) 99.2 F (37.3 C)  SpO2: 100% 96%  started ow-dose ACE inhibitor 12/24/2017 with improvement 9. Fever: Resolved   BC X 2 neg and final.  2/13 UCS with multispecies, chest x-ray reviewed and is negative   Dopplers negative 10.  Acute blood loss anemia   Hemoglobin 10.8 on 2/26    11. Urinary incontinence: Improving   Recent PVRs negative per nursing    Urecholine DC'd on 2/27  LOS (Days) 20 A FACE TO FACE EVALUATION WAS PERFORMED  Meredith Staggers, MD 12/27/2017 8:41 AM

## 2017-12-27 NOTE — Progress Notes (Signed)
Occupational Therapy Discharge Summary  Patient Details  Name: Tammy Arnold MRN: [id number] Date of Birth: 20-Apr-1941  Today's Date: 12/27/2017 OT Individual Time: 1410-3013 OT Individual Time Calculation (min): 70 min    Patient has met 12 of 12 long term goals due to improved activity tolerance, improved balance, ability to compensate for deficits and improved coordination.  Patient to discharge at [facility] Assist level.    Reasons goals not met: all goals met  Recommendation:  Patient will benefit from ongoing skilled OT services in skilled nursing facility setting to continue to advance functional skills in the area of BADL and Reduce care partner burden.  Equipment: defer to next venue of care  Reasons for discharge: treatment goals met  Patient/family agrees with progress made and goals achieved: Yes   OT Intervention: Upon entering the room, pt supine in bed but agreeable to OT intervention. Pt ambulating to bathroom with steady assistance. Pt performed toileting with steady assistance for balance with clothing management and hygiene. Pt required max multimodal cues for sequencing and problem solving of task. Pt removed clothing while seated on toilet and transferred to sit on shower chair for bathing task. Pt performed bathing tasks with overall min A and remaining seated for safety without cues needed to do so. Pt transferred onto commode chair to don clothing items with set up A for UB self care and min A for LB. Pt seated in wheelchair at sink for grooming tasks and then OT assisted pt via wheelchair to day room. Pt required set up A for breakfast tray but able to feed self with utensils in R hand. Pt's husband arriving and pt returning to room and remaining in wheelchair. Pt's quick release belt donned and chair alarm activated. Husband present in room.   OT Discharge Precautions/Restrictions  Precautions Precautions: Fall Precaution Comments: impaired vision, cognitive  deficits Restrictions Weight Bearing Restrictions: No Pain Pain Assessment Pain Assessment: No/denies pain Cognition Overall Cognitive Status: Impaired/Different from baseline Arousal/Alertness: Awake/alert Orientation Level: Oriented to person;Oriented to place;Disoriented to time;Disoriented to situation Attention: Selective Sustained Attention: Appears intact Selective Attention: Impaired Selective Attention Impairment: Verbal basic;Functional basic Memory: Impaired Memory Impairment: Decreased short term memory;Decreased recall of new information;Decreased long term memory;Retrieval deficit Awareness: Impaired Awareness Impairment: Emergent impairment Safety/Judgment: Impaired Mobility  Transfers Sit to Stand: 4: Min guard Stand to Sit: 4: Min guard  Extremity/Trunk Assessment RUE Assessment RUE Assessment: Within Functional Limits LUE Assessment LUE Assessment: Within Functional Limits   See Function Navigator for Current Functional Status.  Gypsy Decant 12/27/2017, 3:57 PM

## 2017-12-27 NOTE — Progress Notes (Signed)
Physical Therapy Discharge Summary  Patient Details  Name: Tammy Arnold MRN: [id number] Date of Birth: 01/08/1941  Patient has met 8 of 8 long term goals due to improved activity tolerance, improved balance and increased strength.  Patient to discharge at an ambulatory level supervision>min assist.   [name] care partner unavailable to provide the necessary physical/cognitive assistance at discharge.  Pt to d/c to SNF for further rehab.   Recommendation:  Patient will benefit from ongoing skilled PT services in skilled nursing facility setting to continue to advance safe functional mobility, address ongoing impairments in balance, strength, activity tolerance, and cognition, and minimize fall risk.  Equipment: No equipment provided  Reasons for discharge: discharge from hospital  Patient/family agrees with progress made and goals achieved: Yes   Pt currently performing mobility with the following assist: Bed mobility: supervision Sit<>Stand: supervision Ambulatory transfers: min guard due to safety awareness deficits Gait up to 150': supervision Stairs x12: min assist  PT Discharge Precautions/Restrictions Precautions Precautions: Fall Precaution Comments: impaired vision, cognitive deficits Restrictions Weight Bearing Restrictions: No Pain Pain Assessment Pain Assessment: No/denies pain  Cognition Overall Cognitive Status: Impaired/Different from baseline Arousal/Alertness: Awake/alert Orientation Level: Oriented to person;Oriented to place;Disoriented to time;Disoriented to situation Attention: Selective Sustained Attention: Appears intact Selective Attention: Impaired Selective Attention Impairment: Verbal basic;Functional basic Memory: Impaired Memory Impairment: Decreased short term memory;Decreased recall of new information;Decreased long term memory;Retrieval deficit Awareness: Impaired Awareness Impairment: Emergent impairment Safety/Judgment:  Impaired  See Function Navigator for Current Functional Status.  Michel Santee 12/27/2017, 1:47 PM

## 2017-12-27 NOTE — Progress Notes (Signed)
Speech Language Pathology Session Note & Discharge Summary  Patient Details  Name: Tammy Arnold MRN: [id number] Date of Birth: April 28, 1941  Today's Date: 12/27/2017 SLP Individual Time: 1015-1100 SLP Individual Time Calculation (min): 45 min   Skilled Therapeutic Interventions:   Skilled treatment session focused on cognitive goals. Patient required Max-Total A for problem solving and visual scanning during a basic calendar making task. Patient also required Max A verbal cues for orientation to time but independently recalled that she "found somewhere else to live" and will d/c today.  Throughout session, patient appeared mildly frustrated and required encouragement for participation.  Patient left upright in wheelchair with chair alarm on and quick release belt in place. Continue with current plan of care.   Patient has met 2 of 3 long term goals.  Patient to discharge at [facility] Max;Total level.   Reasons goals not met: Patient requires overall Max-Total A for functional problem solving   Clinical Impression/Discharge Summary: Patient has made minimal and inconsistent gains and has met 2 of 3 LTG's this admission. Currently, patient requires overall Max-Total A multimodal cues to complete functional and familiar tasks safely in regards to problem solving, recall, awareness and attention. Patient also demonstrates difficulty generating answers to functional questions but can choose answer appropriately when given choices from a field of 2. Patient and family education is complete. [name] family is unable to provide the necessary assistance needed at this time, therefore, patient will discharge to a SNF. Patient would benefit from f/u SLP services to maximize her cognitive function in order to reduce caregiver burden.   Care Partner:  Caregiver Able to Provide Assistance: No  Type of Caregiver Assistance: Physical;Cognitive  Recommendation:  24 hour supervision/assistance;Skilled Nursing  facility  Rationale for SLP Follow Up: Reduce caregiver burden;Maximize cognitive function and independence   Equipment: N/A   Reasons for discharge: Discharged from hospital   Patient/Family Agrees with Progress Made and Goals Achieved: Yes   Function:   Cognition Comprehension Comprehension assist level: Understands basic 25 - 49% of the time/ requires cueing 50 - 75% of the time  Expression   Expression assist level: Expresses basic 25 - 49% of the time/requires cueing 50 - 75% of the time. Uses single words/gestures.  Social Interaction Social Interaction assist level: Interacts appropriately 50 - 74% of the time - May be physically or verbally inappropriate.  Problem Solving Problem solving assist level: Solves basic less than 25% of the time - needs direction nearly all the time or does not effectively solve problems and may need a restraint for safety  Memory Memory assist level: Recognizes or recalls less than 25% of the time/requires cueing greater than 75% of the time   Quartez Lagos 12/27/2017, 1:15 PM

## 2017-12-28 DIAGNOSIS — I10 Essential (primary) hypertension: Secondary | ICD-10-CM | POA: Diagnosis not present

## 2017-12-28 DIAGNOSIS — H538 Other visual disturbances: Secondary | ICD-10-CM | POA: Diagnosis not present

## 2017-12-28 DIAGNOSIS — R270 Ataxia, unspecified: Secondary | ICD-10-CM | POA: Diagnosis not present

## 2017-12-28 DIAGNOSIS — R4189 Other symptoms and signs involving cognitive functions and awareness: Secondary | ICD-10-CM | POA: Diagnosis not present

## 2017-12-28 NOTE — Progress Notes (Signed)
Social Work  Discharge Note  The overall goal for the admission was met for:   Discharge location: No - plan changed to SNF per family request  Length of Stay: Yes - 20 days  Discharge activity level: Yes - supervisions to min assist overall  Home/community participation: No  Services provided included: MD, RD, PT, OT, SLP, RN, TR, Pharmacy, Neuropsych and SW  Financial Services: Medicare and Private Insurance: [employer]  Follow-up services arranged: Other: SNF at [place] (or additional information):  Patient/Family verbalized understanding of follow-up arrangements: Yes  Individual responsible for coordination of the follow-up plan: spouse  Confirmed correct DME delivered: NA    Blanche Scovell

## 2018-01-18 ENCOUNTER — Encounter: Payer: Medicare Other | Admitting: Physical Medicine & Rehabilitation

## 2018-01-20 DIAGNOSIS — E2601 Conn's syndrome: Secondary | ICD-10-CM | POA: Diagnosis not present

## 2018-01-20 DIAGNOSIS — I10 Essential (primary) hypertension: Secondary | ICD-10-CM | POA: Diagnosis not present

## 2018-01-20 DIAGNOSIS — M6281 Muscle weakness (generalized): Secondary | ICD-10-CM | POA: Diagnosis not present

## 2018-01-20 DIAGNOSIS — Z9181 History of falling: Secondary | ICD-10-CM | POA: Diagnosis not present

## 2018-01-20 DIAGNOSIS — Z8673 Personal history of transient ischemic attack (TIA), and cerebral infarction without residual deficits: Secondary | ICD-10-CM | POA: Diagnosis not present

## 2018-01-20 DIAGNOSIS — G3184 Mild cognitive impairment, so stated: Secondary | ICD-10-CM | POA: Diagnosis not present

## 2018-01-20 DIAGNOSIS — G4700 Insomnia, unspecified: Secondary | ICD-10-CM | POA: Diagnosis not present

## 2018-01-24 DIAGNOSIS — G4700 Insomnia, unspecified: Secondary | ICD-10-CM | POA: Diagnosis not present

## 2018-01-24 DIAGNOSIS — I10 Essential (primary) hypertension: Secondary | ICD-10-CM | POA: Diagnosis not present

## 2018-01-24 DIAGNOSIS — E2601 Conn's syndrome: Secondary | ICD-10-CM | POA: Diagnosis not present

## 2018-01-24 DIAGNOSIS — G3184 Mild cognitive impairment, so stated: Secondary | ICD-10-CM | POA: Diagnosis not present

## 2018-01-24 DIAGNOSIS — I69993 Ataxia following unspecified cerebrovascular disease: Secondary | ICD-10-CM | POA: Diagnosis not present

## 2018-01-24 DIAGNOSIS — R4189 Other symptoms and signs involving cognitive functions and awareness: Secondary | ICD-10-CM | POA: Diagnosis not present

## 2018-01-24 DIAGNOSIS — M810 Age-related osteoporosis without current pathological fracture: Secondary | ICD-10-CM | POA: Diagnosis not present

## 2018-01-24 DIAGNOSIS — M6281 Muscle weakness (generalized): Secondary | ICD-10-CM | POA: Diagnosis not present

## 2018-01-24 DIAGNOSIS — F411 Generalized anxiety disorder: Secondary | ICD-10-CM | POA: Diagnosis not present

## 2018-01-24 DIAGNOSIS — E7800 Pure hypercholesterolemia, unspecified: Secondary | ICD-10-CM | POA: Diagnosis not present

## 2018-01-24 DIAGNOSIS — Z8673 Personal history of transient ischemic attack (TIA), and cerebral infarction without residual deficits: Secondary | ICD-10-CM | POA: Diagnosis not present

## 2018-01-26 DIAGNOSIS — E2601 Conn's syndrome: Secondary | ICD-10-CM | POA: Diagnosis not present

## 2018-01-26 DIAGNOSIS — G3184 Mild cognitive impairment, so stated: Secondary | ICD-10-CM | POA: Diagnosis not present

## 2018-01-26 DIAGNOSIS — Z8673 Personal history of transient ischemic attack (TIA), and cerebral infarction without residual deficits: Secondary | ICD-10-CM | POA: Diagnosis not present

## 2018-01-26 DIAGNOSIS — G4700 Insomnia, unspecified: Secondary | ICD-10-CM | POA: Diagnosis not present

## 2018-01-26 DIAGNOSIS — M6281 Muscle weakness (generalized): Secondary | ICD-10-CM | POA: Diagnosis not present

## 2018-01-26 DIAGNOSIS — I10 Essential (primary) hypertension: Secondary | ICD-10-CM | POA: Diagnosis not present

## 2018-01-27 DIAGNOSIS — M6281 Muscle weakness (generalized): Secondary | ICD-10-CM | POA: Diagnosis not present

## 2018-01-27 DIAGNOSIS — E2601 Conn's syndrome: Secondary | ICD-10-CM | POA: Diagnosis not present

## 2018-01-27 DIAGNOSIS — I10 Essential (primary) hypertension: Secondary | ICD-10-CM | POA: Diagnosis not present

## 2018-01-27 DIAGNOSIS — Z8673 Personal history of transient ischemic attack (TIA), and cerebral infarction without residual deficits: Secondary | ICD-10-CM | POA: Diagnosis not present

## 2018-01-27 DIAGNOSIS — G4700 Insomnia, unspecified: Secondary | ICD-10-CM | POA: Diagnosis not present

## 2018-01-27 DIAGNOSIS — G3184 Mild cognitive impairment, so stated: Secondary | ICD-10-CM | POA: Diagnosis not present

## 2018-01-31 DIAGNOSIS — G3184 Mild cognitive impairment, so stated: Secondary | ICD-10-CM | POA: Diagnosis not present

## 2018-01-31 DIAGNOSIS — Z8673 Personal history of transient ischemic attack (TIA), and cerebral infarction without residual deficits: Secondary | ICD-10-CM | POA: Diagnosis not present

## 2018-01-31 DIAGNOSIS — G4700 Insomnia, unspecified: Secondary | ICD-10-CM | POA: Diagnosis not present

## 2018-01-31 DIAGNOSIS — I10 Essential (primary) hypertension: Secondary | ICD-10-CM | POA: Diagnosis not present

## 2018-01-31 DIAGNOSIS — M6281 Muscle weakness (generalized): Secondary | ICD-10-CM | POA: Diagnosis not present

## 2018-01-31 DIAGNOSIS — E2601 Conn's syndrome: Secondary | ICD-10-CM | POA: Diagnosis not present

## 2018-02-01 DIAGNOSIS — E2601 Conn's syndrome: Secondary | ICD-10-CM | POA: Diagnosis not present

## 2018-02-01 DIAGNOSIS — G4700 Insomnia, unspecified: Secondary | ICD-10-CM | POA: Diagnosis not present

## 2018-02-01 DIAGNOSIS — G3184 Mild cognitive impairment, so stated: Secondary | ICD-10-CM | POA: Diagnosis not present

## 2018-02-01 DIAGNOSIS — M6281 Muscle weakness (generalized): Secondary | ICD-10-CM | POA: Diagnosis not present

## 2018-02-01 DIAGNOSIS — I10 Essential (primary) hypertension: Secondary | ICD-10-CM | POA: Diagnosis not present

## 2018-02-01 DIAGNOSIS — Z8673 Personal history of transient ischemic attack (TIA), and cerebral infarction without residual deficits: Secondary | ICD-10-CM | POA: Diagnosis not present

## 2018-02-02 DIAGNOSIS — G3184 Mild cognitive impairment, so stated: Secondary | ICD-10-CM | POA: Diagnosis not present

## 2018-02-02 DIAGNOSIS — E2601 Conn's syndrome: Secondary | ICD-10-CM | POA: Diagnosis not present

## 2018-02-02 DIAGNOSIS — Z8673 Personal history of transient ischemic attack (TIA), and cerebral infarction without residual deficits: Secondary | ICD-10-CM | POA: Diagnosis not present

## 2018-02-02 DIAGNOSIS — I10 Essential (primary) hypertension: Secondary | ICD-10-CM | POA: Diagnosis not present

## 2018-02-02 DIAGNOSIS — M6281 Muscle weakness (generalized): Secondary | ICD-10-CM | POA: Diagnosis not present

## 2018-02-02 DIAGNOSIS — G4700 Insomnia, unspecified: Secondary | ICD-10-CM | POA: Diagnosis not present

## 2018-02-03 DIAGNOSIS — Z8673 Personal history of transient ischemic attack (TIA), and cerebral infarction without residual deficits: Secondary | ICD-10-CM | POA: Diagnosis not present

## 2018-02-03 DIAGNOSIS — G4700 Insomnia, unspecified: Secondary | ICD-10-CM | POA: Diagnosis not present

## 2018-02-03 DIAGNOSIS — I10 Essential (primary) hypertension: Secondary | ICD-10-CM | POA: Diagnosis not present

## 2018-02-03 DIAGNOSIS — G3184 Mild cognitive impairment, so stated: Secondary | ICD-10-CM | POA: Diagnosis not present

## 2018-02-03 DIAGNOSIS — M6281 Muscle weakness (generalized): Secondary | ICD-10-CM | POA: Diagnosis not present

## 2018-02-03 DIAGNOSIS — E2601 Conn's syndrome: Secondary | ICD-10-CM | POA: Diagnosis not present

## 2018-02-06 DIAGNOSIS — G3184 Mild cognitive impairment, so stated: Secondary | ICD-10-CM | POA: Diagnosis not present

## 2018-02-06 DIAGNOSIS — I10 Essential (primary) hypertension: Secondary | ICD-10-CM | POA: Diagnosis not present

## 2018-02-06 DIAGNOSIS — E2601 Conn's syndrome: Secondary | ICD-10-CM | POA: Diagnosis not present

## 2018-02-06 DIAGNOSIS — G4700 Insomnia, unspecified: Secondary | ICD-10-CM | POA: Diagnosis not present

## 2018-02-06 DIAGNOSIS — Z8673 Personal history of transient ischemic attack (TIA), and cerebral infarction without residual deficits: Secondary | ICD-10-CM | POA: Diagnosis not present

## 2018-02-06 DIAGNOSIS — M6281 Muscle weakness (generalized): Secondary | ICD-10-CM | POA: Diagnosis not present

## 2018-02-07 ENCOUNTER — Telehealth: Payer: Self-pay | Admitting: Neurology

## 2018-02-07 ENCOUNTER — Ambulatory Visit (INDEPENDENT_AMBULATORY_CARE_PROVIDER_SITE_OTHER): Payer: Medicare Other | Admitting: Neurology

## 2018-02-07 ENCOUNTER — Encounter: Payer: Self-pay | Admitting: Neurology

## 2018-02-07 ENCOUNTER — Ambulatory Visit: Payer: Medicare Other | Admitting: Neurology

## 2018-02-07 VITALS — BP 135/82 | HR 72 | Ht 64.0 in | Wt 121.8 lb

## 2018-02-07 DIAGNOSIS — E785 Hyperlipidemia, unspecified: Secondary | ICD-10-CM | POA: Diagnosis not present

## 2018-02-07 DIAGNOSIS — I10 Essential (primary) hypertension: Secondary | ICD-10-CM | POA: Diagnosis not present

## 2018-02-07 DIAGNOSIS — G4700 Insomnia, unspecified: Secondary | ICD-10-CM | POA: Diagnosis not present

## 2018-02-07 DIAGNOSIS — H538 Other visual disturbances: Secondary | ICD-10-CM | POA: Diagnosis not present

## 2018-02-07 DIAGNOSIS — R4189 Other symptoms and signs involving cognitive functions and awareness: Secondary | ICD-10-CM

## 2018-02-07 DIAGNOSIS — Z8673 Personal history of transient ischemic attack (TIA), and cerebral infarction without residual deficits: Secondary | ICD-10-CM | POA: Diagnosis not present

## 2018-02-07 DIAGNOSIS — M6281 Muscle weakness (generalized): Secondary | ICD-10-CM | POA: Diagnosis not present

## 2018-02-07 DIAGNOSIS — G3184 Mild cognitive impairment, so stated: Secondary | ICD-10-CM | POA: Diagnosis not present

## 2018-02-07 DIAGNOSIS — E2601 Conn's syndrome: Secondary | ICD-10-CM | POA: Diagnosis not present

## 2018-02-07 MED ORDER — MEMANTINE HCL 28 X 5 MG & 21 X 10 MG PO TABS: ORAL_TABLET | ORAL | 12 refills | Status: DC

## 2018-02-07 MED ORDER — MEMANTINE HCL 10 MG PO TABS: 10.0000 mg | ORAL_TABLET | Freq: Two times a day (BID) | ORAL | 3 refills | Status: DC

## 2018-02-07 NOTE — Telephone Encounter (Signed)
Hinton Dyer can you call patients husband see referral.Dr Richrd Sox is out till September 2019. Please send to another MD for neuro testing.thanks

## 2018-02-07 NOTE — Telephone Encounter (Signed)
Pt was seen by Dr. Erlinda Hong on 4/16- at check out Dr. Erlinda Hong requested 3 mo f/u w/ Dr. Leonie Man. Pt husband stated he would need to look at his schedule and then call to make the appointment.

## 2018-02-07 NOTE — Progress Notes (Signed)
STROKE NEUROLOGY FOLLOW UP NOTE  NAME: Tammy Arnold DOB: 08-Aug-1941  REASON FOR VISIT: stroke follow up HISTORY FROM: Husband and chart  Today we had the pleasure of seeing Tammy Arnold in follow-up at our [facility]. Tammy Arnold was accompanied by husband.   History Summary Tammy Arnold is a 77 y.o. female with history of HTN, HLD, cognitive decline admitted on 12/04/17 after a fall and difficulty walking.  Patient has progressive cognitive decline for the last 2-3 years however the difficulty walking was acute after a fall and consider related to back injury during the fall.  Tammy Arnold received Tammy Arnold/OT and discharged to CIR.  In terms of her cognitive decline, Tammy Arnold was found to have profound anomia, disorientation, simultanagnosia, optic ataxia and ocular apraxia, consistent Balint syndrome.  Recommend outpatient follow-up with neurology.  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  CTA head neck and CT perfusion unremarkable.  EF 60-65%.  No PFO.  LDL 114 and A1c 5.4, TSH and B12 WNL and RPR neg.   Interval History During the interval time, the patient has been doing better.  Tammy Arnold was able to walk without assistant, feed herself, lnear back to baseline of her physical activity.  Still has mild back pain in the morning but much better in the afternoon.  Also some improvement of cognition, including optic ataxia and ocular apraxia, however still have profound anomia, disorientation, visual spatial deficit and simultanagnosia.  Cannot write, cannot remember well.  REVIEW OF SYSTEMS: Full 14 system review of systems performed and notable only for those listed below and in HPI above, all others are negative:  Constitutional: Weight loss Cardiovascular:  Ear/Nose/Throat: Hearing loss, spinning sensation Skin: Rash, itching Eyes: Blurry vision Respiratory: Snoring Gastroitestinal:   Genitourinary:  Hematology/Lymphatic: Easy bruising Endocrine:  Musculoskeletal:  Joint pain, severe back pain Allergy/Immunology:   Neurological: Memory loss Psychiatric: Depression, anxiety, disinterest in activities Sleep: Snoring  The following represents the patient's updated allergies and side effects list: Allergies  Allergen Reactions  . Baclofen Other (See Comments)    Caused brain dysfunction  . Keflex [Cephalexin] Rash  . Macrodantin [Nitrofurantoin] Rash  . Neggram [Nalidixic Acid] Rash  . Penicillins Rash    Has patient had a PCN reaction causing immediate rash, facial/tongue/throat swelling, SOB or lightheadedness with hypotension: Yes Has patient had a PCN reaction causing severe rash involving mucus membranes or skin necrosis: No Has patient had a PCN reaction that required hospitalization: No Has patient had a PCN reaction occurring within the last 10 years: No If all of the above answers are "NO", then may proceed with Cephalosporin use.  . Sulfa Antibiotics Rash    The neurologically relevant items on the patient's problem list were reviewed on today's visit.  Neurologic Examination  A problem focused neurological exam (12 or more points of the single system neurologic examination, vital signs counts as 1 point, cranial nerves count for 8 points) was performed.  Blood pressure 135/82, pulse 72, height 5\' 4"  (1.626 m), weight 121 lb 12.8 oz (55.2 kg).  General - thin built, well developed, in no apparent distress, with mild restlessness.  Ophthalmologic - fundi not visualized due to noncooperation.  Cardiovascular - Regular rate and rhythm with no murmur.  Musculoskeletal - left shoulder lower than right due to chronic scoliosis  Mental Status -  Awake alert, orientated to self and people, knows Tuesday, but not orientated to age, place, or time. Language exam showed spontaneous speech,  able to follow one step command and able to repeat, however, profound anomia, not able to follow two-step commands, difficulty with left and right  differentiation. Attention span and concentration exam showed not able to backward spelling and not able to calculate. Recent and remote memory were impaired with 0/3 delayed recall. Fund of Knowledge was assessed and was impaired.  Tammy Arnold refused MOCA today  Cranial Nerves II - XII - II - Visual field exam showed simultanagnosia with pictures.  III, IV, VI - Extraocular movements intact with smooth pursuit and spontaneous saccades. V - Facial sensation intact bilaterally. VII - Facial movement intact bilaterally. VIII - Hearing & vestibular intact bilaterally. X - Palate elevates symmetrically. XI - Chin turning & shoulder shrug intact bilaterally. XII - Tongue protrusion intact.  Motor Strength - The patient's strength was normal in all extremities and pronator drift was absent.  Bulk was normal and fasciculations were absent.   Motor Tone - Muscle tone was assessed at the neck and appendages and was normal.  Reflexes - The patient's reflexes were symmetrical in all extremities and Tammy Arnold had no pathological reflexes.  Sensory - Light touch, temperature/pinprick were assessed and were symmetrical.    Coordination - The patient had dysmetria BUEs.  Tremor was absent.  Gait and Station - stooped posturing, scoliosis   Data reviewed: I personally reviewed the images and agree with the radiology interpretations.  Ct Angio Head W Or Wo Contrast  Result Date: 12/04/2017 CLINICAL DATA:  Focal neuro deficit for greater than 6 hours. EXAM: CT ANGIOGRAPHY HEAD AND NECK CT PERFUSION BRAIN TECHNIQUE: Multidetector CT imaging of the head and neck was performed using the standard protocol during bolus administration of intravenous contrast. Multiplanar CT image reconstructions and MIPs were obtained to evaluate the vascular anatomy. Carotid stenosis measurements (when applicable) are obtained utilizing NASCET criteria, using the distal internal carotid diameter as the denominator. Multiphase CT  imaging of the brain was performed following IV bolus contrast injection. Subsequent parametric perfusion maps were calculated using RAPID software. CONTRAST:  127mL ISOVUE-370 IOPAMIDOL (ISOVUE-370) INJECTION 76% COMPARISON:  CT head without contrast from the same day. FINDINGS: CTA NECK FINDINGS Aortic arch: A 3 vessel arch configuration is present. There is minimal calcification at the origin of the left subclavian artery. Additional calcifications are present in the distal arch without aneurysm or stenosis. Right carotid system: The right common carotid artery is within normal limits. Atherosclerotic calcifications are present at the bifurcation. There is no significant stenosis. Cervical right ICA is within normal limits. Left carotid system: Is the left common carotid artery is within normal limits. Atherosclerotic changes are present at the bifurcation. There is no significant stenosis relative to the more distal vessel. The cervical left ICA is unremarkable. Vertebral arteries: The left vertebral artery is dominant to the right. Both vertebral arteries originate from the subclavian arteries without significant stenosis. There tortuosity in the proximal left vertebral artery. No stenoses are present in either vertebral artery throughout the neck. Skeleton: Grade 1 anterolisthesis is present at C3-4. There is chronic loss of disc height at C4-5, C5-6, and C6-7. Other neck: The soft tissues the neck are otherwise unremarkable. Salivary glands are within normal limits bilaterally. No focal mucosal or submucosal lesions are present. The thyroid is within normal limits. There is no significant adenopathy. Upper chest: Interlobular septal thickening is present. There is no focal consolidation. Review of the MIP images confirms the above findings CTA HEAD FINDINGS Anterior circulation: Atherosclerotic irregularity is present in the  cavernous and precavernous internal carotid arteries bilaterally. There is no  significant stenosis through the ICA termini. The right A1 segment is hypoplastic. Anterior communicating artery is patent. Both A2 segments fill. M1 segments are within normal limits. MCA bifurcations are normal. Is small vessel attenuation is present in the MCA branch vessels bilaterally without a significant proximal stenosis or occlusion. Posterior circulation: The left vertebral artery is the dominant vessel. PICA origins are visualized and normal bilaterally. The basilar artery is within normal limits. Both posterior cerebral arteries originate from the basilar tip. The PCA branch vessels are within normal limits. Venous sinuses: Dural sinuses are patent. The right transverse sinus is dominant. Straight sinus deep cerebral veins are intact. Cortical veins are unremarkable. Anatomic variants: None. Review of the MIP images confirms the above findings CT Brain Perfusion Findings: CBF (<30%) Volume: 41mL Perfusion (Tmax>6.0s) volume: 10mL A remote left occipital lobe infarct is noted. IMPRESSION: 1. Mild atherosclerotic changes at the origin of the left subclavian artery and at the carotid bifurcations bilaterally without significant stenosis relative to the more distal vessels. 2. Atherosclerotic irregularity within the cavernous and precavernous internal carotid arteries bilaterally without significant stenosis. 3. Distal small vessel disease in the MCA branch vessels bilaterally without significant proximal stenosis, aneurysm, or branch vessel occlusion. 4. Remote left occipital lobe infarct. 5. Perfusion imaging demonstrates no acute infarct or significant ischemia. 6. Spondylosis of the cervical spine as described. Electronically Signed   By: San Morelle M.D.   On: 12/04/2017 20:47   Ct Head Wo Contrast  Result Date: 12/04/2017 CLINICAL DATA:  Pain following fall.  Expressive aphasia. EXAM: CT HEAD WITHOUT CONTRAST TECHNIQUE: Contiguous axial images were obtained from the base of the skull  through the vertex without intravenous contrast. COMPARISON:  None. FINDINGS: Brain: There is moderate generalized ventricular enlargement. There is milder sulcal prominence diffusely. There is no appreciable intracranial mass, hemorrhage, extra-axial fluid collection, midline shift. There is a small focus of 4 decreased attenuation in the mid left cerebellum which is rather ill-defined and is concerning for a small acute infarct in the mid left cerebellar hemisphere. Elsewhere, there is patchy small vessel disease in the centra semiovale bilaterally. There is small vessel disease in the posterior limb of each internal capsule. Vascular: No hyperdense vessel. There is calcification in each carotid siphon region. Skull: The bony calvarium appears intact. Sinuses/Orbits: There is mucosal thickening in several ethmoid air cells. There is slight mucosal thickening in the lateral right maxillary antrum. Other paranasal sinuses are clear. Orbits appear symmetric bilaterally except for apparent prior cataract surgery on the left. Other: Mastoid air cells are clear. There is debris in each external auditory canal. IMPRESSION: 1. Focal area of decreased attenuation in the mid left cerebellum, concerning for recent and possibly acute infarct in this area. 2. Atrophy with ventricles somewhat larger in proportion in sulci. Question a degree of superimposed normal pressure hydrocephalus. 3. Patchy supratentorial small vessel disease. No mass or hemorrhage. 4.  There are foci of arterial vascular calcification. 5.  Mild paranasal sinus disease. Electronically Signed   By: Lowella Grip III M.D.   On: 12/04/2017 18:59   Ct Angio Neck W Or Wo Contrast  Result Date: 12/04/2017 CLINICAL DATA:  Focal neuro deficit for greater than 6 hours. EXAM: CT ANGIOGRAPHY HEAD AND NECK CT PERFUSION BRAIN TECHNIQUE: Multidetector CT imaging of the head and neck was performed using the standard protocol during bolus administration of  intravenous contrast. Multiplanar CT image reconstructions and MIPs were  obtained to evaluate the vascular anatomy. Carotid stenosis measurements (when applicable) are obtained utilizing NASCET criteria, using the distal internal carotid diameter as the denominator. Multiphase CT imaging of the brain was performed following IV bolus contrast injection. Subsequent parametric perfusion maps were calculated using RAPID software. CONTRAST:  174mL ISOVUE-370 IOPAMIDOL (ISOVUE-370) INJECTION 76% COMPARISON:  CT head without contrast from the same day. FINDINGS: CTA NECK FINDINGS Aortic arch: A 3 vessel arch configuration is present. There is minimal calcification at the origin of the left subclavian artery. Additional calcifications are present in the distal arch without aneurysm or stenosis. Right carotid system: The right common carotid artery is within normal limits. Atherosclerotic calcifications are present at the bifurcation. There is no significant stenosis. Cervical right ICA is within normal limits. Left carotid system: Is the left common carotid artery is within normal limits. Atherosclerotic changes are present at the bifurcation. There is no significant stenosis relative to the more distal vessel. The cervical left ICA is unremarkable. Vertebral arteries: The left vertebral artery is dominant to the right. Both vertebral arteries originate from the subclavian arteries without significant stenosis. There tortuosity in the proximal left vertebral artery. No stenoses are present in either vertebral artery throughout the neck. Skeleton: Grade 1 anterolisthesis is present at C3-4. There is chronic loss of disc height at C4-5, C5-6, and C6-7. Other neck: The soft tissues the neck are otherwise unremarkable. Salivary glands are within normal limits bilaterally. No focal mucosal or submucosal lesions are present. The thyroid is within normal limits. There is no significant adenopathy. Upper chest: Interlobular septal  thickening is present. There is no focal consolidation. Review of the MIP images confirms the above findings CTA HEAD FINDINGS Anterior circulation: Atherosclerotic irregularity is present in the cavernous and precavernous internal carotid arteries bilaterally. There is no significant stenosis through the ICA termini. The right A1 segment is hypoplastic. Anterior communicating artery is patent. Both A2 segments fill. M1 segments are within normal limits. MCA bifurcations are normal. Is small vessel attenuation is present in the MCA branch vessels bilaterally without a significant proximal stenosis or occlusion. Posterior circulation: The left vertebral artery is the dominant vessel. PICA origins are visualized and normal bilaterally. The basilar artery is within normal limits. Both posterior cerebral arteries originate from the basilar tip. The PCA branch vessels are within normal limits. Venous sinuses: Dural sinuses are patent. The right transverse sinus is dominant. Straight sinus deep cerebral veins are intact. Cortical veins are unremarkable. Anatomic variants: None. Review of the MIP images confirms the above findings CT Brain Perfusion Findings: CBF (<30%) Volume: 47mL Perfusion (Tmax>6.0s) volume: 38mL A remote left occipital lobe infarct is noted. IMPRESSION: 1. Mild atherosclerotic changes at the origin of the left subclavian artery and at the carotid bifurcations bilaterally without significant stenosis relative to the more distal vessels. 2. Atherosclerotic irregularity within the cavernous and precavernous internal carotid arteries bilaterally without significant stenosis. 3. Distal small vessel disease in the MCA branch vessels bilaterally without significant proximal stenosis, aneurysm, or branch vessel occlusion. 4. Remote left occipital lobe infarct. 5. Perfusion imaging demonstrates no acute infarct or significant ischemia. 6. Spondylosis of the cervical spine as described. Electronically Signed    By: San Morelle M.D.   On: 12/04/2017 20:47   Mr Brain Wo Contrast  Result Date: 12/05/2017 CLINICAL DATA:  Fall with gait abnormality.  Expressive aphasia. EXAM: MRI HEAD WITHOUT CONTRAST TECHNIQUE: Multiplanar, multiecho pulse sequences of the brain and surrounding structures were obtained without intravenous contrast. COMPARISON:  Head CT 12/04/2017 FINDINGS: Brain: The midline structures are normal. There is no acute infarct or acute hemorrhage. No mass lesion, hydrocephalus, dural abnormality or extra-axial collection. There is periventricular white matter hyperintensity consistent with chronic small vessel disease. Generalized volume loss with ex vacuo dilatation of the ventricles. No chronic microhemorrhage or superficial siderosis. Vascular: Major intracranial arterial and venous sinus flow voids are preserved. Skull and upper cervical spine: The visualized skull base, calvarium, upper cervical spine and extracranial soft tissues are normal. Sinuses/Orbits: No fluid levels or advanced mucosal thickening. No mastoid or middle ear effusion. Normal orbits. IMPRESSION: Generalized volume loss and sequelae of chronic ischemic microangiopathy without acute intracranial abnormality. Electronically Signed   By: Ulyses Jarred M.D.   On: 12/05/2017 20:00   Ct Cerebral Perfusion W Contrast  Result Date: 12/04/2017 CLINICAL DATA:  Focal neuro deficit for greater than 6 hours. EXAM: CT ANGIOGRAPHY HEAD AND NECK CT PERFUSION BRAIN TECHNIQUE: Multidetector CT imaging of the head and neck was performed using the standard protocol during bolus administration of intravenous contrast. Multiplanar CT image reconstructions and MIPs were obtained to evaluate the vascular anatomy. Carotid stenosis measurements (when applicable) are obtained utilizing NASCET criteria, using the distal internal carotid diameter as the denominator. Multiphase CT imaging of the brain was performed following IV bolus contrast  injection. Subsequent parametric perfusion maps were calculated using RAPID software. CONTRAST:  118mL ISOVUE-370 IOPAMIDOL (ISOVUE-370) INJECTION 76% COMPARISON:  CT head without contrast from the same day. FINDINGS: CTA NECK FINDINGS Aortic arch: A 3 vessel arch configuration is present. There is minimal calcification at the origin of the left subclavian artery. Additional calcifications are present in the distal arch without aneurysm or stenosis. Right carotid system: The right common carotid artery is within normal limits. Atherosclerotic calcifications are present at the bifurcation. There is no significant stenosis. Cervical right ICA is within normal limits. Left carotid system: Is the left common carotid artery is within normal limits. Atherosclerotic changes are present at the bifurcation. There is no significant stenosis relative to the more distal vessel. The cervical left ICA is unremarkable. Vertebral arteries: The left vertebral artery is dominant to the right. Both vertebral arteries originate from the subclavian arteries without significant stenosis. There tortuosity in the proximal left vertebral artery. No stenoses are present in either vertebral artery throughout the neck. Skeleton: Grade 1 anterolisthesis is present at C3-4. There is chronic loss of disc height at C4-5, C5-6, and C6-7. Other neck: The soft tissues the neck are otherwise unremarkable. Salivary glands are within normal limits bilaterally. No focal mucosal or submucosal lesions are present. The thyroid is within normal limits. There is no significant adenopathy. Upper chest: Interlobular septal thickening is present. There is no focal consolidation. Review of the MIP images confirms the above findings CTA HEAD FINDINGS Anterior circulation: Atherosclerotic irregularity is present in the cavernous and precavernous internal carotid arteries bilaterally. There is no significant stenosis through the ICA termini. The right A1 segment is  hypoplastic. Anterior communicating artery is patent. Both A2 segments fill. M1 segments are within normal limits. MCA bifurcations are normal. Is small vessel attenuation is present in the MCA branch vessels bilaterally without a significant proximal stenosis or occlusion. Posterior circulation: The left vertebral artery is the dominant vessel. PICA origins are visualized and normal bilaterally. The basilar artery is within normal limits. Both posterior cerebral arteries originate from the basilar tip. The PCA branch vessels are within normal limits. Venous sinuses: Dural sinuses are patent. The right transverse sinus  is dominant. Straight sinus deep cerebral veins are intact. Cortical veins are unremarkable. Anatomic variants: None. Review of the MIP images confirms the above findings CT Brain Perfusion Findings: CBF (<30%) Volume: 58mL Perfusion (Tmax>6.0s) volume: 19mL A remote left occipital lobe infarct is noted. IMPRESSION: 1. Mild atherosclerotic changes at the origin of the left subclavian artery and at the carotid bifurcations bilaterally without significant stenosis relative to the more distal vessels. 2. Atherosclerotic irregularity within the cavernous and precavernous internal carotid arteries bilaterally without significant stenosis. 3. Distal small vessel disease in the MCA branch vessels bilaterally without significant proximal stenosis, aneurysm, or branch vessel occlusion. 4. Remote left occipital lobe infarct. 5. Perfusion imaging demonstrates no acute infarct or significant ischemia. 6. Spondylosis of the cervical spine as described. Electronically Signed   By: San Morelle M.D.   On: 12/04/2017 20:47   TTE  Study Conclusions - Left ventricle: The cavity size was normal. Wall thickness was increased in a pattern of mild LVH. Systolic function was normal. The estimated ejection fraction was in the range of 60% to 65%. Wall motion was normal; there were no regional wall  motion abnormalities. Doppler parameters are consistent with abnormal left ventricular relaxation (grade 1 diastolic dysfunction). - Aortic valve: There was no stenosis. - Mitral valve: Mildly calcified annulus. There was no significant regurgitation. - Left atrium: The atrium was mildly to moderately dilated. - Right ventricle: The cavity size was normal. Systolic function was normal. - Pulmonary arteries: No complete TR doppler jet so unable to estimate PA systolic pressure. - Inferior vena cava: The vessel was normal in size. The respirophasic diameter changes were in the normal range (>= 50%), consistent with normal central venous pressure. Impressions:- Normal LV size with mild LV hypertrophy. EF 60-65%. Normal RV size and systolic function. No significant valvular abnormalities.    Component     Latest Ref Rng & Units 12/04/2017 12/05/2017  Cholesterol     0 - 200 mg/dL  168  Triglycerides     <150 mg/dL  46  HDL Cholesterol     >40 mg/dL  45  Total CHOL/HDL Ratio     RATIO  3.7  VLDL     0 - 40 mg/dL  9  LDL (calc)     0 - 99 mg/dL  114 (H)  Hemoglobin A1C     4.8 - 5.6 % 5.4   Mean Plasma Glucose     mg/dL 108.28   Vitamin B12     180 - 914 pg/mL  367  RPR     Non Reactive  Non Reactive  TSH     0.350 - 4.500 uIU/mL  2.537    Assessment: As you may recall, Tammy Arnold is a 77 y.o. Caucasian female with PMH of HTN, HLD, progressive cognitive decline for the last 2-3 years admitted on 12/04/17 after a fall and difficulty walking.  Tammy Arnold was found to have profound anomia, disorientation, simultanagnosia, optic ataxia and ocular apraxia, consistent Balint syndrome.  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  CTA head neck and CT perfusion unremarkable.  EF 60-65%.  No PFO.  LDL 114 and A1c 5.4, TSH and B12 WNL and RPR neg. Her acute walking difficulty likely due to LBP after fall, Tammy Arnold was discharged to CIR. During the interval time,  difficulty walking nearly resolved and now back to baseline of her physical activity.  Still has profound anomia, disorientation, visual spatial deficit and simultanagnosia, but optic ataxia and  ocular apraxia seems improved some. Will need neuropsych testing and will start namenda.   Plan:  - continue current medications.  - will refer to neuropsychology testing.  - will start namenda for cognitive impairment management.  - avoid fall - follow up with PCP regularly  - follow up in 3 months with Dr. Leonie Man  I spent more than 25 minutes of face to face time with the patient. Greater than 50% of time was spent in counseling and coordination of care. We discussed about balint syndrome, neuropsych testing, mental exercise, and start namenda.    Orders Placed This Encounter  Procedures  . Ambulatory referral to Neuropsychology    Referral Priority:   Routine    Referral Type:   Psychiatric    Referral Reason:   Specialty Services Required    Requested Specialty:   Psychology    Number of Visits Requested:   1    Meds ordered this encounter  Medications  . memantine (NAMENDA TITRATION PAK) tablet pack    Sig: 5 mg/day for =1 week; 5 mg twice daily for =1 week; 15 mg/day given in 5 mg and 10 mg separated doses for =1 week; then 10 mg twice daily    Dispense:  49 tablet    Refill:  12  . memantine (NAMENDA) 10 MG tablet    Sig: Take 1 tablet (10 mg total) by mouth 2 (two) times daily.    Dispense:  60 tablet    Refill:  3    After finish the titration pack.    Patient Instructions  - continue current medications.  - will refer to neuropsychology testing.  - will start namenda for cognitive impairment management.  - avoid fall - follow up with PCP regularly  - follow up in 3 months with Dr. Demetrios Loll, MD PhD [facility] [address] [place], [place] [address] [phone]

## 2018-02-07 NOTE — Patient Instructions (Addendum)
-   continue current medications.  - will refer to neuropsychology testing.  - will start namenda for cognitive impairment management.  - avoid fall - follow up with PCP regularly  - follow up in 3 months with Dr. Leonie Man

## 2018-02-07 NOTE — Telephone Encounter (Signed)
Pt husband has called re: the Neuro Physc  Exam that Dr Erlinda Hong wants pt to have.  Pt husband states she has been told 1st available appointment is in the month of Sept. Husband asking if this month being so far out will warrant Dr Erlinda Hong to go outside of [facility] network to get this testing done a lot earlier than Sept.  Pt husband is asking for a call  back

## 2018-02-08 ENCOUNTER — Telehealth: Payer: Self-pay

## 2018-02-08 NOTE — Telephone Encounter (Signed)
Made in error

## 2018-02-08 NOTE — Telephone Encounter (Signed)
Spoke to [name] husband and he was in the middle of something and I will have to call him back 02/09/2018.  Dr. Ane Payment [phone] - fax [phone]  She is located in [facility] and she has openings . [name] husband will let me know  02/09/2018.  I will contact patient's Husband 02/09/2018.

## 2018-02-08 NOTE — Telephone Encounter (Signed)
Great. Thanks.   Rosalin Hawking, MD PhD Stroke Neurology 02/08/2018 5:26 PM

## 2018-02-09 DIAGNOSIS — G4700 Insomnia, unspecified: Secondary | ICD-10-CM | POA: Diagnosis not present

## 2018-02-09 DIAGNOSIS — E2601 Conn's syndrome: Secondary | ICD-10-CM | POA: Diagnosis not present

## 2018-02-09 DIAGNOSIS — G3184 Mild cognitive impairment, so stated: Secondary | ICD-10-CM | POA: Diagnosis not present

## 2018-02-09 DIAGNOSIS — Z8673 Personal history of transient ischemic attack (TIA), and cerebral infarction without residual deficits: Secondary | ICD-10-CM | POA: Diagnosis not present

## 2018-02-09 DIAGNOSIS — I10 Essential (primary) hypertension: Secondary | ICD-10-CM | POA: Diagnosis not present

## 2018-02-09 DIAGNOSIS — M6281 Muscle weakness (generalized): Secondary | ICD-10-CM | POA: Diagnosis not present

## 2018-02-09 NOTE — Telephone Encounter (Signed)
I have talked to patient's husband and he want's to take his wife to Dr. Gildardo Pounds  In [place].  Dr. Eulas Post  Does have neuropsych testing for cognitive impairment.   Telephone [phone] - Fax [phone]. Patient Husband is aware of all Details and has all information.

## 2018-02-09 NOTE — Telephone Encounter (Signed)
Thanks again, [place].  Rosalin Hawking, MD PhD Stroke Neurology 02/09/2018 2:35 PM

## 2018-02-10 DIAGNOSIS — E2601 Conn's syndrome: Secondary | ICD-10-CM | POA: Diagnosis not present

## 2018-02-10 DIAGNOSIS — I10 Essential (primary) hypertension: Secondary | ICD-10-CM | POA: Diagnosis not present

## 2018-02-10 DIAGNOSIS — Z8673 Personal history of transient ischemic attack (TIA), and cerebral infarction without residual deficits: Secondary | ICD-10-CM | POA: Diagnosis not present

## 2018-02-10 DIAGNOSIS — M6281 Muscle weakness (generalized): Secondary | ICD-10-CM | POA: Diagnosis not present

## 2018-02-10 DIAGNOSIS — G3184 Mild cognitive impairment, so stated: Secondary | ICD-10-CM | POA: Diagnosis not present

## 2018-02-10 DIAGNOSIS — G4700 Insomnia, unspecified: Secondary | ICD-10-CM | POA: Diagnosis not present

## 2018-02-13 DIAGNOSIS — Z8673 Personal history of transient ischemic attack (TIA), and cerebral infarction without residual deficits: Secondary | ICD-10-CM | POA: Diagnosis not present

## 2018-02-13 DIAGNOSIS — E2601 Conn's syndrome: Secondary | ICD-10-CM | POA: Diagnosis not present

## 2018-02-13 DIAGNOSIS — I10 Essential (primary) hypertension: Secondary | ICD-10-CM | POA: Diagnosis not present

## 2018-02-13 DIAGNOSIS — M6281 Muscle weakness (generalized): Secondary | ICD-10-CM | POA: Diagnosis not present

## 2018-02-13 DIAGNOSIS — G3184 Mild cognitive impairment, so stated: Secondary | ICD-10-CM | POA: Diagnosis not present

## 2018-02-13 DIAGNOSIS — G4700 Insomnia, unspecified: Secondary | ICD-10-CM | POA: Diagnosis not present

## 2018-02-14 DIAGNOSIS — G3184 Mild cognitive impairment, so stated: Secondary | ICD-10-CM | POA: Diagnosis not present

## 2018-02-14 DIAGNOSIS — E2601 Conn's syndrome: Secondary | ICD-10-CM | POA: Diagnosis not present

## 2018-02-14 DIAGNOSIS — G4700 Insomnia, unspecified: Secondary | ICD-10-CM | POA: Diagnosis not present

## 2018-02-14 DIAGNOSIS — Z8673 Personal history of transient ischemic attack (TIA), and cerebral infarction without residual deficits: Secondary | ICD-10-CM | POA: Diagnosis not present

## 2018-02-14 DIAGNOSIS — M6281 Muscle weakness (generalized): Secondary | ICD-10-CM | POA: Diagnosis not present

## 2018-02-14 DIAGNOSIS — I10 Essential (primary) hypertension: Secondary | ICD-10-CM | POA: Diagnosis not present

## 2018-02-15 DIAGNOSIS — I10 Essential (primary) hypertension: Secondary | ICD-10-CM | POA: Diagnosis not present

## 2018-02-15 DIAGNOSIS — M6281 Muscle weakness (generalized): Secondary | ICD-10-CM | POA: Diagnosis not present

## 2018-02-15 DIAGNOSIS — E2601 Conn's syndrome: Secondary | ICD-10-CM | POA: Diagnosis not present

## 2018-02-15 DIAGNOSIS — G3184 Mild cognitive impairment, so stated: Secondary | ICD-10-CM | POA: Diagnosis not present

## 2018-02-15 DIAGNOSIS — Z8673 Personal history of transient ischemic attack (TIA), and cerebral infarction without residual deficits: Secondary | ICD-10-CM | POA: Diagnosis not present

## 2018-02-15 DIAGNOSIS — G4700 Insomnia, unspecified: Secondary | ICD-10-CM | POA: Diagnosis not present

## 2018-02-20 DIAGNOSIS — M6281 Muscle weakness (generalized): Secondary | ICD-10-CM | POA: Diagnosis not present

## 2018-02-20 DIAGNOSIS — G4700 Insomnia, unspecified: Secondary | ICD-10-CM | POA: Diagnosis not present

## 2018-02-20 DIAGNOSIS — G3184 Mild cognitive impairment, so stated: Secondary | ICD-10-CM | POA: Diagnosis not present

## 2018-02-20 DIAGNOSIS — Z8673 Personal history of transient ischemic attack (TIA), and cerebral infarction without residual deficits: Secondary | ICD-10-CM | POA: Diagnosis not present

## 2018-02-20 DIAGNOSIS — E2601 Conn's syndrome: Secondary | ICD-10-CM | POA: Diagnosis not present

## 2018-02-20 DIAGNOSIS — I10 Essential (primary) hypertension: Secondary | ICD-10-CM | POA: Diagnosis not present

## 2018-02-21 DIAGNOSIS — I10 Essential (primary) hypertension: Secondary | ICD-10-CM | POA: Diagnosis not present

## 2018-02-21 DIAGNOSIS — Z8673 Personal history of transient ischemic attack (TIA), and cerebral infarction without residual deficits: Secondary | ICD-10-CM | POA: Diagnosis not present

## 2018-02-21 DIAGNOSIS — M6281 Muscle weakness (generalized): Secondary | ICD-10-CM | POA: Diagnosis not present

## 2018-02-21 DIAGNOSIS — G4700 Insomnia, unspecified: Secondary | ICD-10-CM | POA: Diagnosis not present

## 2018-02-21 DIAGNOSIS — G3184 Mild cognitive impairment, so stated: Secondary | ICD-10-CM | POA: Diagnosis not present

## 2018-02-21 DIAGNOSIS — E2601 Conn's syndrome: Secondary | ICD-10-CM | POA: Diagnosis not present

## 2018-02-21 NOTE — Telephone Encounter (Signed)
Patient has apt with Dr. Rica Mote  May 28th at 8:30 am for Neuropsychological  Evaluation.  [name] husband is aware.

## 2018-02-22 NOTE — Telephone Encounter (Signed)
Great. Thank you for the update.  Rosalin Hawking, MD PhD Stroke Neurology 02/22/2018 5:47 PM

## 2018-02-23 DIAGNOSIS — I10 Essential (primary) hypertension: Secondary | ICD-10-CM | POA: Diagnosis not present

## 2018-02-23 DIAGNOSIS — E2601 Conn's syndrome: Secondary | ICD-10-CM | POA: Diagnosis not present

## 2018-02-23 DIAGNOSIS — G4700 Insomnia, unspecified: Secondary | ICD-10-CM | POA: Diagnosis not present

## 2018-02-23 DIAGNOSIS — M6281 Muscle weakness (generalized): Secondary | ICD-10-CM | POA: Diagnosis not present

## 2018-02-23 DIAGNOSIS — G3184 Mild cognitive impairment, so stated: Secondary | ICD-10-CM | POA: Diagnosis not present

## 2018-02-23 DIAGNOSIS — Z8673 Personal history of transient ischemic attack (TIA), and cerebral infarction without residual deficits: Secondary | ICD-10-CM | POA: Diagnosis not present

## 2018-02-27 DIAGNOSIS — M6281 Muscle weakness (generalized): Secondary | ICD-10-CM | POA: Diagnosis not present

## 2018-02-27 DIAGNOSIS — I10 Essential (primary) hypertension: Secondary | ICD-10-CM | POA: Diagnosis not present

## 2018-02-27 DIAGNOSIS — E2601 Conn's syndrome: Secondary | ICD-10-CM | POA: Diagnosis not present

## 2018-02-27 DIAGNOSIS — G3184 Mild cognitive impairment, so stated: Secondary | ICD-10-CM | POA: Diagnosis not present

## 2018-02-27 DIAGNOSIS — G4700 Insomnia, unspecified: Secondary | ICD-10-CM | POA: Diagnosis not present

## 2018-02-27 DIAGNOSIS — Z8673 Personal history of transient ischemic attack (TIA), and cerebral infarction without residual deficits: Secondary | ICD-10-CM | POA: Diagnosis not present

## 2018-03-01 ENCOUNTER — Other Ambulatory Visit: Payer: Self-pay | Admitting: Neurology

## 2018-03-01 DIAGNOSIS — G4700 Insomnia, unspecified: Secondary | ICD-10-CM | POA: Diagnosis not present

## 2018-03-01 DIAGNOSIS — E2601 Conn's syndrome: Secondary | ICD-10-CM | POA: Diagnosis not present

## 2018-03-01 DIAGNOSIS — Z8673 Personal history of transient ischemic attack (TIA), and cerebral infarction without residual deficits: Secondary | ICD-10-CM | POA: Diagnosis not present

## 2018-03-01 DIAGNOSIS — G3184 Mild cognitive impairment, so stated: Secondary | ICD-10-CM | POA: Diagnosis not present

## 2018-03-01 DIAGNOSIS — I10 Essential (primary) hypertension: Secondary | ICD-10-CM | POA: Diagnosis not present

## 2018-03-01 DIAGNOSIS — M6281 Muscle weakness (generalized): Secondary | ICD-10-CM | POA: Diagnosis not present

## 2018-03-03 ENCOUNTER — Other Ambulatory Visit: Payer: Self-pay | Admitting: Neurology

## 2018-03-07 DIAGNOSIS — G4700 Insomnia, unspecified: Secondary | ICD-10-CM | POA: Diagnosis not present

## 2018-03-07 DIAGNOSIS — G3184 Mild cognitive impairment, so stated: Secondary | ICD-10-CM | POA: Diagnosis not present

## 2018-03-07 DIAGNOSIS — M6281 Muscle weakness (generalized): Secondary | ICD-10-CM | POA: Diagnosis not present

## 2018-03-07 DIAGNOSIS — E2601 Conn's syndrome: Secondary | ICD-10-CM | POA: Diagnosis not present

## 2018-03-07 DIAGNOSIS — I10 Essential (primary) hypertension: Secondary | ICD-10-CM | POA: Diagnosis not present

## 2018-03-07 DIAGNOSIS — Z8673 Personal history of transient ischemic attack (TIA), and cerebral infarction without residual deficits: Secondary | ICD-10-CM | POA: Diagnosis not present

## 2018-03-09 DIAGNOSIS — G3184 Mild cognitive impairment, so stated: Secondary | ICD-10-CM | POA: Diagnosis not present

## 2018-03-09 DIAGNOSIS — Z8673 Personal history of transient ischemic attack (TIA), and cerebral infarction without residual deficits: Secondary | ICD-10-CM | POA: Diagnosis not present

## 2018-03-09 DIAGNOSIS — I10 Essential (primary) hypertension: Secondary | ICD-10-CM | POA: Diagnosis not present

## 2018-03-09 DIAGNOSIS — M6281 Muscle weakness (generalized): Secondary | ICD-10-CM | POA: Diagnosis not present

## 2018-03-09 DIAGNOSIS — E2601 Conn's syndrome: Secondary | ICD-10-CM | POA: Diagnosis not present

## 2018-03-09 DIAGNOSIS — G4700 Insomnia, unspecified: Secondary | ICD-10-CM | POA: Diagnosis not present

## 2018-03-10 DIAGNOSIS — G4700 Insomnia, unspecified: Secondary | ICD-10-CM | POA: Diagnosis not present

## 2018-03-10 DIAGNOSIS — G3184 Mild cognitive impairment, so stated: Secondary | ICD-10-CM | POA: Diagnosis not present

## 2018-03-10 DIAGNOSIS — Z8673 Personal history of transient ischemic attack (TIA), and cerebral infarction without residual deficits: Secondary | ICD-10-CM | POA: Diagnosis not present

## 2018-03-10 DIAGNOSIS — I10 Essential (primary) hypertension: Secondary | ICD-10-CM | POA: Diagnosis not present

## 2018-03-10 DIAGNOSIS — E2601 Conn's syndrome: Secondary | ICD-10-CM | POA: Diagnosis not present

## 2018-03-10 DIAGNOSIS — M6281 Muscle weakness (generalized): Secondary | ICD-10-CM | POA: Diagnosis not present

## 2018-03-13 DIAGNOSIS — E2601 Conn's syndrome: Secondary | ICD-10-CM | POA: Diagnosis not present

## 2018-03-13 DIAGNOSIS — M6281 Muscle weakness (generalized): Secondary | ICD-10-CM | POA: Diagnosis not present

## 2018-03-13 DIAGNOSIS — G4700 Insomnia, unspecified: Secondary | ICD-10-CM | POA: Diagnosis not present

## 2018-03-13 DIAGNOSIS — G3184 Mild cognitive impairment, so stated: Secondary | ICD-10-CM | POA: Diagnosis not present

## 2018-03-13 DIAGNOSIS — I10 Essential (primary) hypertension: Secondary | ICD-10-CM | POA: Diagnosis not present

## 2018-03-13 DIAGNOSIS — Z8673 Personal history of transient ischemic attack (TIA), and cerebral infarction without residual deficits: Secondary | ICD-10-CM | POA: Diagnosis not present

## 2018-03-14 DIAGNOSIS — M5136 Other intervertebral disc degeneration, lumbar region: Secondary | ICD-10-CM | POA: Diagnosis not present

## 2018-03-16 DIAGNOSIS — E2601 Conn's syndrome: Secondary | ICD-10-CM | POA: Diagnosis not present

## 2018-03-16 DIAGNOSIS — Z8673 Personal history of transient ischemic attack (TIA), and cerebral infarction without residual deficits: Secondary | ICD-10-CM | POA: Diagnosis not present

## 2018-03-16 DIAGNOSIS — M6281 Muscle weakness (generalized): Secondary | ICD-10-CM | POA: Diagnosis not present

## 2018-03-16 DIAGNOSIS — G3184 Mild cognitive impairment, so stated: Secondary | ICD-10-CM | POA: Diagnosis not present

## 2018-03-16 DIAGNOSIS — I10 Essential (primary) hypertension: Secondary | ICD-10-CM | POA: Diagnosis not present

## 2018-03-16 DIAGNOSIS — G4700 Insomnia, unspecified: Secondary | ICD-10-CM | POA: Diagnosis not present

## 2018-03-17 DIAGNOSIS — G4700 Insomnia, unspecified: Secondary | ICD-10-CM | POA: Diagnosis not present

## 2018-03-17 DIAGNOSIS — G3184 Mild cognitive impairment, so stated: Secondary | ICD-10-CM | POA: Diagnosis not present

## 2018-03-17 DIAGNOSIS — M6281 Muscle weakness (generalized): Secondary | ICD-10-CM | POA: Diagnosis not present

## 2018-03-17 DIAGNOSIS — Z8673 Personal history of transient ischemic attack (TIA), and cerebral infarction without residual deficits: Secondary | ICD-10-CM | POA: Diagnosis not present

## 2018-03-17 DIAGNOSIS — E2601 Conn's syndrome: Secondary | ICD-10-CM | POA: Diagnosis not present

## 2018-03-17 DIAGNOSIS — I10 Essential (primary) hypertension: Secondary | ICD-10-CM | POA: Diagnosis not present

## 2018-03-21 DIAGNOSIS — R41841 Cognitive communication deficit: Secondary | ICD-10-CM | POA: Diagnosis not present

## 2018-03-21 DIAGNOSIS — I10 Essential (primary) hypertension: Secondary | ICD-10-CM | POA: Diagnosis not present

## 2018-03-21 DIAGNOSIS — R413 Other amnesia: Secondary | ICD-10-CM | POA: Diagnosis not present

## 2018-03-21 DIAGNOSIS — Z8673 Personal history of transient ischemic attack (TIA), and cerebral infarction without residual deficits: Secondary | ICD-10-CM | POA: Diagnosis not present

## 2018-03-21 DIAGNOSIS — Z9181 History of falling: Secondary | ICD-10-CM | POA: Diagnosis not present

## 2018-03-21 DIAGNOSIS — M049 Autoinflammatory syndrome, unspecified: Secondary | ICD-10-CM | POA: Diagnosis not present

## 2018-03-22 DIAGNOSIS — Z8673 Personal history of transient ischemic attack (TIA), and cerebral infarction without residual deficits: Secondary | ICD-10-CM | POA: Diagnosis not present

## 2018-03-22 DIAGNOSIS — M049 Autoinflammatory syndrome, unspecified: Secondary | ICD-10-CM | POA: Diagnosis not present

## 2018-03-22 DIAGNOSIS — Z9181 History of falling: Secondary | ICD-10-CM | POA: Diagnosis not present

## 2018-03-22 DIAGNOSIS — I10 Essential (primary) hypertension: Secondary | ICD-10-CM | POA: Diagnosis not present

## 2018-03-22 DIAGNOSIS — R41841 Cognitive communication deficit: Secondary | ICD-10-CM | POA: Diagnosis not present

## 2018-03-24 DIAGNOSIS — I10 Essential (primary) hypertension: Secondary | ICD-10-CM | POA: Diagnosis not present

## 2018-03-24 DIAGNOSIS — Z8673 Personal history of transient ischemic attack (TIA), and cerebral infarction without residual deficits: Secondary | ICD-10-CM | POA: Diagnosis not present

## 2018-03-24 DIAGNOSIS — R41841 Cognitive communication deficit: Secondary | ICD-10-CM | POA: Diagnosis not present

## 2018-03-24 DIAGNOSIS — Z9181 History of falling: Secondary | ICD-10-CM | POA: Diagnosis not present

## 2018-03-24 DIAGNOSIS — M049 Autoinflammatory syndrome, unspecified: Secondary | ICD-10-CM | POA: Diagnosis not present

## 2018-03-30 DIAGNOSIS — I69993 Ataxia following unspecified cerebrovascular disease: Secondary | ICD-10-CM | POA: Diagnosis not present

## 2018-03-30 DIAGNOSIS — M810 Age-related osteoporosis without current pathological fracture: Secondary | ICD-10-CM | POA: Diagnosis not present

## 2018-03-30 DIAGNOSIS — E7800 Pure hypercholesterolemia, unspecified: Secondary | ICD-10-CM | POA: Diagnosis not present

## 2018-03-30 DIAGNOSIS — I10 Essential (primary) hypertension: Secondary | ICD-10-CM | POA: Diagnosis not present

## 2018-03-30 DIAGNOSIS — R4189 Other symptoms and signs involving cognitive functions and awareness: Secondary | ICD-10-CM | POA: Diagnosis not present

## 2018-03-30 DIAGNOSIS — R413 Other amnesia: Secondary | ICD-10-CM | POA: Diagnosis not present

## 2018-04-04 DIAGNOSIS — M5136 Other intervertebral disc degeneration, lumbar region: Secondary | ICD-10-CM | POA: Diagnosis not present

## 2018-04-06 DIAGNOSIS — M049 Autoinflammatory syndrome, unspecified: Secondary | ICD-10-CM | POA: Diagnosis not present

## 2018-04-06 DIAGNOSIS — Z9181 History of falling: Secondary | ICD-10-CM | POA: Diagnosis not present

## 2018-04-06 DIAGNOSIS — R41841 Cognitive communication deficit: Secondary | ICD-10-CM | POA: Diagnosis not present

## 2018-04-06 DIAGNOSIS — I10 Essential (primary) hypertension: Secondary | ICD-10-CM | POA: Diagnosis not present

## 2018-04-06 DIAGNOSIS — Z8673 Personal history of transient ischemic attack (TIA), and cerebral infarction without residual deficits: Secondary | ICD-10-CM | POA: Diagnosis not present

## 2018-04-07 DIAGNOSIS — R41841 Cognitive communication deficit: Secondary | ICD-10-CM | POA: Diagnosis not present

## 2018-04-07 DIAGNOSIS — M049 Autoinflammatory syndrome, unspecified: Secondary | ICD-10-CM | POA: Diagnosis not present

## 2018-04-07 DIAGNOSIS — Z8673 Personal history of transient ischemic attack (TIA), and cerebral infarction without residual deficits: Secondary | ICD-10-CM | POA: Diagnosis not present

## 2018-04-07 DIAGNOSIS — Z9181 History of falling: Secondary | ICD-10-CM | POA: Diagnosis not present

## 2018-04-07 DIAGNOSIS — I10 Essential (primary) hypertension: Secondary | ICD-10-CM | POA: Diagnosis not present

## 2018-04-10 ENCOUNTER — Telehealth: Payer: Self-pay

## 2018-04-10 NOTE — Telephone Encounter (Signed)
Rn receive report from  [facility] PT was referred by Dr. Erlinda Hong for neuropsychological evaluation. PT schedule with Dr Leonie Man in May 11 2018. Report put in Dr. Leonie Man box for next visit.

## 2018-04-11 DIAGNOSIS — R41841 Cognitive communication deficit: Secondary | ICD-10-CM | POA: Diagnosis not present

## 2018-04-11 DIAGNOSIS — Z8673 Personal history of transient ischemic attack (TIA), and cerebral infarction without residual deficits: Secondary | ICD-10-CM | POA: Diagnosis not present

## 2018-04-11 DIAGNOSIS — M049 Autoinflammatory syndrome, unspecified: Secondary | ICD-10-CM | POA: Diagnosis not present

## 2018-04-11 DIAGNOSIS — Z9181 History of falling: Secondary | ICD-10-CM | POA: Diagnosis not present

## 2018-04-11 DIAGNOSIS — I10 Essential (primary) hypertension: Secondary | ICD-10-CM | POA: Diagnosis not present

## 2018-04-13 DIAGNOSIS — M049 Autoinflammatory syndrome, unspecified: Secondary | ICD-10-CM | POA: Diagnosis not present

## 2018-04-13 DIAGNOSIS — Z8673 Personal history of transient ischemic attack (TIA), and cerebral infarction without residual deficits: Secondary | ICD-10-CM | POA: Diagnosis not present

## 2018-04-13 DIAGNOSIS — Z9181 History of falling: Secondary | ICD-10-CM | POA: Diagnosis not present

## 2018-04-13 DIAGNOSIS — R41841 Cognitive communication deficit: Secondary | ICD-10-CM | POA: Diagnosis not present

## 2018-04-13 DIAGNOSIS — I10 Essential (primary) hypertension: Secondary | ICD-10-CM | POA: Diagnosis not present

## 2018-04-18 DIAGNOSIS — Z9181 History of falling: Secondary | ICD-10-CM | POA: Diagnosis not present

## 2018-04-18 DIAGNOSIS — M049 Autoinflammatory syndrome, unspecified: Secondary | ICD-10-CM | POA: Diagnosis not present

## 2018-04-18 DIAGNOSIS — R41841 Cognitive communication deficit: Secondary | ICD-10-CM | POA: Diagnosis not present

## 2018-04-18 DIAGNOSIS — Z8673 Personal history of transient ischemic attack (TIA), and cerebral infarction without residual deficits: Secondary | ICD-10-CM | POA: Diagnosis not present

## 2018-04-18 DIAGNOSIS — I10 Essential (primary) hypertension: Secondary | ICD-10-CM | POA: Diagnosis not present

## 2018-04-20 DIAGNOSIS — Z9181 History of falling: Secondary | ICD-10-CM | POA: Diagnosis not present

## 2018-04-20 DIAGNOSIS — M049 Autoinflammatory syndrome, unspecified: Secondary | ICD-10-CM | POA: Diagnosis not present

## 2018-04-20 DIAGNOSIS — R41841 Cognitive communication deficit: Secondary | ICD-10-CM | POA: Diagnosis not present

## 2018-04-20 DIAGNOSIS — I10 Essential (primary) hypertension: Secondary | ICD-10-CM | POA: Diagnosis not present

## 2018-04-20 DIAGNOSIS — Z8673 Personal history of transient ischemic attack (TIA), and cerebral infarction without residual deficits: Secondary | ICD-10-CM | POA: Diagnosis not present

## 2018-04-25 DIAGNOSIS — Z8673 Personal history of transient ischemic attack (TIA), and cerebral infarction without residual deficits: Secondary | ICD-10-CM | POA: Diagnosis not present

## 2018-04-25 DIAGNOSIS — R41841 Cognitive communication deficit: Secondary | ICD-10-CM | POA: Diagnosis not present

## 2018-04-25 DIAGNOSIS — I10 Essential (primary) hypertension: Secondary | ICD-10-CM | POA: Diagnosis not present

## 2018-04-25 DIAGNOSIS — Z9181 History of falling: Secondary | ICD-10-CM | POA: Diagnosis not present

## 2018-04-25 DIAGNOSIS — M049 Autoinflammatory syndrome, unspecified: Secondary | ICD-10-CM | POA: Diagnosis not present

## 2018-04-28 DIAGNOSIS — Z8673 Personal history of transient ischemic attack (TIA), and cerebral infarction without residual deficits: Secondary | ICD-10-CM | POA: Diagnosis not present

## 2018-04-28 DIAGNOSIS — M049 Autoinflammatory syndrome, unspecified: Secondary | ICD-10-CM | POA: Diagnosis not present

## 2018-04-28 DIAGNOSIS — R41841 Cognitive communication deficit: Secondary | ICD-10-CM | POA: Diagnosis not present

## 2018-04-28 DIAGNOSIS — Z9181 History of falling: Secondary | ICD-10-CM | POA: Diagnosis not present

## 2018-04-28 DIAGNOSIS — I10 Essential (primary) hypertension: Secondary | ICD-10-CM | POA: Diagnosis not present

## 2018-05-02 DIAGNOSIS — I10 Essential (primary) hypertension: Secondary | ICD-10-CM | POA: Diagnosis not present

## 2018-05-02 DIAGNOSIS — Z9181 History of falling: Secondary | ICD-10-CM | POA: Diagnosis not present

## 2018-05-02 DIAGNOSIS — Z8673 Personal history of transient ischemic attack (TIA), and cerebral infarction without residual deficits: Secondary | ICD-10-CM | POA: Diagnosis not present

## 2018-05-02 DIAGNOSIS — M049 Autoinflammatory syndrome, unspecified: Secondary | ICD-10-CM | POA: Diagnosis not present

## 2018-05-02 DIAGNOSIS — R41841 Cognitive communication deficit: Secondary | ICD-10-CM | POA: Diagnosis not present

## 2018-05-09 DIAGNOSIS — M049 Autoinflammatory syndrome, unspecified: Secondary | ICD-10-CM | POA: Diagnosis not present

## 2018-05-09 DIAGNOSIS — I10 Essential (primary) hypertension: Secondary | ICD-10-CM | POA: Diagnosis not present

## 2018-05-09 DIAGNOSIS — Z8673 Personal history of transient ischemic attack (TIA), and cerebral infarction without residual deficits: Secondary | ICD-10-CM | POA: Diagnosis not present

## 2018-05-09 DIAGNOSIS — Z9181 History of falling: Secondary | ICD-10-CM | POA: Diagnosis not present

## 2018-05-09 DIAGNOSIS — R41841 Cognitive communication deficit: Secondary | ICD-10-CM | POA: Diagnosis not present

## 2018-05-11 ENCOUNTER — Ambulatory Visit (INDEPENDENT_AMBULATORY_CARE_PROVIDER_SITE_OTHER): Payer: Medicare Other | Admitting: Neurology

## 2018-05-11 ENCOUNTER — Encounter: Payer: Self-pay | Admitting: Neurology

## 2018-05-11 VITALS — BP 152/75 | HR 60 | Ht 64.0 in | Wt 125.0 lb

## 2018-05-11 DIAGNOSIS — G319 Degenerative disease of nervous system, unspecified: Secondary | ICD-10-CM | POA: Diagnosis not present

## 2018-05-11 MED ORDER — MEMANTINE HCL ER 28 MG PO CP24: 28.0000 mg | ORAL_CAPSULE | Freq: Every day | ORAL | 3 refills | Status: DC

## 2018-05-11 NOTE — Progress Notes (Signed)
STROKE NEUROLOGY FOLLOW UP NOTE  NAME: Tammy Arnold DOB: 10-04-1941  REASON FOR VISIT: stroke follow up HISTORY FROM: Husband and chart  Today we had the pleasure of seeing Tammy Arnold in follow-up at our [facility]. Pt was accompanied by husband.   History Summary Tammy Arnold is a 77 y.o. female with history of HTN, HLD, cognitive decline admitted on 12/04/17 after a fall and difficulty walking.  Patient has progressive cognitive decline for the last 2-3 years however the difficulty walking was acute after a fall and consider related to back injury during the fall.  She received PT/OT and discharged to [facility].  In terms of her cognitive decline, she was found to have profound anomia, disorientation, simultanagnosia, optic ataxia and ocular apraxia, consistent Balint syndrome.  Recommend outpatient follow-up with neurology.  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  CTA head neck and CT perfusion unremarkable.  EF 60-65%.  No PFO.  LDL 114 and A1c 5.4, TSH and B12 WNL and RPR neg.  Dr Erlinda Hong 02/02/2018 : During the interval time, the patient has been doing better.  She was able to walk without assistant, feed herself, lnear back to baseline of her physical activity.  Still has mild back pain in the morning but much better in the afternoon.  Also some improvement of cognition, including optic ataxia and ocular apraxia, however still have profound anomia, disorientation, visual spatial deficit and simultanagnosia.  Cannot write, cannot remember well. Update 05/11/2018 : Tammy Arnold returns for follow-up today after last visit with Dr. Starleen Blue 3 months ago. She is accompanied by husband. He feels that she may have shown some improvement in her optic ataxia and speech and language. However she continues to have significant impaired memory and at times she cannot complete sentences and is searching for words. She did undergo detailed neuropsych testing on 03/21/18 by  Dr. Rica Mote and was found to have profound impairment in multiple areas of cognitive function in a pattern compatible with major neuro psychological disorder like Alzheimer's the patient's husband feels that her sense of humor is still good. She has no trouble learning new information and needs constant reminders. She can carry out simple social conversations. She still gets a right and left-sided mixed up and has trouble with visual spatial orientation a lot. She requires constant supervision. Her behavior has been quite calm. There is no agitation. She does not have delusions or hallucinations or any unsafe for violent behavior. She has tolerated Namenda 10 mg twice daily but the husband is not sure whether it is particularly helpful. He however agrees that she has not progressed or gotten worse.  REVIEW OF SYSTEMS: Full 14 system review of systems performed and notable only for those listed below and in HPI above, all others are negative: Diarrhea, cold intolerance, easy bruising and bleeding, memory loss, snoring, walking difficulty, anxiety and nervousness and all other systems negative   The following represents the patient's updated allergies and side effects list: Allergies  Allergen Reactions  . Baclofen Other (See Comments)    Caused brain dysfunction  . Nitrofurantoin Monohyd Macro   . Keflex [Cephalexin] Rash  . Macrodantin [Nitrofurantoin] Rash  . Neggram [Nalidixic Acid] Rash  . Penicillins Rash    Has patient had a PCN reaction causing immediate rash, facial/tongue/throat swelling, SOB or lightheadedness with hypotension: Yes Has patient had a PCN reaction causing severe rash involving mucus membranes or skin necrosis: No Has patient had  a PCN reaction that required hospitalization: No Has patient had a PCN reaction occurring within the last 10 years: No If all of the above answers are "NO", then may proceed with Cephalosporin use.  . Sulfa Antibiotics Rash    The  neurologically relevant items on the patient's problem list were reviewed on today's visit.  Neurologic Examination  A problem focused neurological exam (12 or more points of the single system neurologic examination, vital signs counts as 1 point, cranial nerves count for 8 points) was performed.  Blood pressure (!) 152/75, pulse 60, height 5\' 4"  (1.626 m), weight 125 lb (56.7 kg).  General - thin built, well developed, in no apparent distress, with mild restlessness.  Ophthalmologic - fundi not visualized due to noncooperation.  Cardiovascular - Regular rate and rhythm with no murmur.  Musculoskeletal - left shoulder lower than right due to chronic scoliosis  Mental Status -  Awake alert, orientated to self and people, knows Tuesday, but not orientated to age, place, or time. Language exam showed spontaneous speech, able to follow one step command and able to repeat, however, profound anomia, not able to follow two-step commands, difficulty with left and right differentiation. Attention span and concentration exam showed not able to backward spelling and not able to calculate. Recent and remote memory were impaired with 0/3 delayed recall. Fund of Knowledge was assessed and was impaired.  Pt refused MOCA today  Cranial Nerves II - XII - II - Visual field exam showed simultanagnosia with pictures.  III, IV, VI - Extraocular movements intact with smooth pursuit and spontaneous saccades. V - Facial sensation intact bilaterally. VII - Facial movement intact bilaterally. VIII - Hearing & vestibular intact bilaterally. X - Palate elevates symmetrically. XI - Chin turning & shoulder shrug intact bilaterally. XII - Tongue protrusion intact.  Motor Strength - The patient's strength was normal in all extremities and pronator drift was absent.  Bulk was normal and fasciculations were absent.   Motor Tone - Muscle tone was assessed at the neck and appendages and was normal.  Reflexes  - The patient's reflexes were symmetrical in all extremities and she had no pathological reflexes.  Sensory - Light touch, temperature/pinprick were assessed and were symmetrical.    Coordination - The patient had dysmetria BUEs.  Tremor was absent.  Gait and Station - stooped posturing, scoliosis   Data reviewed: I personally reviewed the images and agree with the radiology interpretations.  Ct Angio Head W Or Wo Contrast  Result Date: 12/04/2017 CLINICAL DATA:  Focal neuro deficit for greater than 6 hours. EXAM: CT ANGIOGRAPHY HEAD AND NECK CT PERFUSION BRAIN TECHNIQUE: Multidetector CT imaging of the head and neck was performed using the standard protocol during bolus administration of intravenous contrast. Multiplanar CT image reconstructions and MIPs were obtained to evaluate the vascular anatomy. Carotid stenosis measurements (when applicable) are obtained utilizing NASCET criteria, using the distal internal carotid diameter as the denominator. Multiphase CT imaging of the brain was performed following IV bolus contrast injection. Subsequent parametric perfusion maps were calculated using RAPID software. CONTRAST:  13mL ISOVUE-370 IOPAMIDOL (ISOVUE-370) INJECTION 76% COMPARISON:  CT head without contrast from the same day. FINDINGS: CTA NECK FINDINGS Aortic arch: A 3 vessel arch configuration is present. There is minimal calcification at the origin of the left subclavian artery. Additional calcifications are present in the distal arch without aneurysm or stenosis. Right carotid system: The right common carotid artery is within normal limits. Atherosclerotic calcifications are present at the bifurcation.  There is no significant stenosis. Cervical right ICA is within normal limits. Left carotid system: Is the left common carotid artery is within normal limits. Atherosclerotic changes are present at the bifurcation. There is no significant stenosis relative to the more distal vessel. The  cervical left ICA is unremarkable. Vertebral arteries: The left vertebral artery is dominant to the right. Both vertebral arteries originate from the subclavian arteries without significant stenosis. There tortuosity in the proximal left vertebral artery. No stenoses are present in either vertebral artery throughout the neck. Skeleton: Grade 1 anterolisthesis is present at C3-4. There is chronic loss of disc height at C4-5, C5-6, and C6-7. Other neck: The soft tissues the neck are otherwise unremarkable. Salivary glands are within normal limits bilaterally. No focal mucosal or submucosal lesions are present. The thyroid is within normal limits. There is no significant adenopathy. Upper chest: Interlobular septal thickening is present. There is no focal consolidation. Review of the MIP images confirms the above findings CTA HEAD FINDINGS Anterior circulation: Atherosclerotic irregularity is present in the cavernous and precavernous internal carotid arteries bilaterally. There is no significant stenosis through the ICA termini. The right A1 segment is hypoplastic. Anterior communicating artery is patent. Both A2 segments fill. M1 segments are within normal limits. MCA bifurcations are normal. Is small vessel attenuation is present in the MCA branch vessels bilaterally without a significant proximal stenosis or occlusion. Posterior circulation: The left vertebral artery is the dominant vessel. PICA origins are visualized and normal bilaterally. The basilar artery is within normal limits. Both posterior cerebral arteries originate from the basilar tip. The PCA branch vessels are within normal limits. Venous sinuses: Dural sinuses are patent. The right transverse sinus is dominant. Straight sinus deep cerebral veins are intact. Cortical veins are unremarkable. Anatomic variants: None. Review of the MIP images confirms the above findings CT Brain Perfusion Findings: CBF (<30%) Volume: 38mL Perfusion (Tmax>6.0s) volume: 81mL  A remote left occipital lobe infarct is noted. IMPRESSION: 1. Mild atherosclerotic changes at the origin of the left subclavian artery and at the carotid bifurcations bilaterally without significant stenosis relative to the more distal vessels. 2. Atherosclerotic irregularity within the cavernous and precavernous internal carotid arteries bilaterally without significant stenosis. 3. Distal small vessel disease in the MCA branch vessels bilaterally without significant proximal stenosis, aneurysm, or branch vessel occlusion. 4. Remote left occipital lobe infarct. 5. Perfusion imaging demonstrates no acute infarct or significant ischemia. 6. Spondylosis of the cervical spine as described. Electronically Signed   By: San Morelle M.D.   On: 12/04/2017 20:47   Ct Head Wo Contrast  Result Date: 12/04/2017 CLINICAL DATA:  Pain following fall.  Expressive aphasia. EXAM: CT HEAD WITHOUT CONTRAST TECHNIQUE: Contiguous axial images were obtained from the base of the skull through the vertex without intravenous contrast. COMPARISON:  None. FINDINGS: Brain: There is moderate generalized ventricular enlargement. There is milder sulcal prominence diffusely. There is no appreciable intracranial mass, hemorrhage, extra-axial fluid collection, midline shift. There is a small focus of 4 decreased attenuation in the mid left cerebellum which is rather ill-defined and is concerning for a small acute infarct in the mid left cerebellar hemisphere. Elsewhere, there is patchy small vessel disease in the centra semiovale bilaterally. There is small vessel disease in the posterior limb of each internal capsule. Vascular: No hyperdense vessel. There is calcification in each carotid siphon region. Skull: The bony calvarium appears intact. Sinuses/Orbits: There is mucosal thickening in several ethmoid air cells. There is slight mucosal thickening in the  lateral right maxillary antrum. Other paranasal sinuses are clear. Orbits  appear symmetric bilaterally except for apparent prior cataract surgery on the left. Other: Mastoid air cells are clear. There is debris in each external auditory canal. IMPRESSION: 1. Focal area of decreased attenuation in the mid left cerebellum, concerning for recent and possibly acute infarct in this area. 2. Atrophy with ventricles somewhat larger in proportion in sulci. Question a degree of superimposed normal pressure hydrocephalus. 3. Patchy supratentorial small vessel disease. No mass or hemorrhage. 4.  There are foci of arterial vascular calcification. 5.  Mild paranasal sinus disease. Electronically Signed   By: Lowella Grip III M.D.   On: 12/04/2017 18:59   Ct Angio Neck W Or Wo Contrast  Result Date: 12/04/2017 CLINICAL DATA:  Focal neuro deficit for greater than 6 hours. EXAM: CT ANGIOGRAPHY HEAD AND NECK CT PERFUSION BRAIN TECHNIQUE: Multidetector CT imaging of the head and neck was performed using the standard protocol during bolus administration of intravenous contrast. Multiplanar CT image reconstructions and MIPs were obtained to evaluate the vascular anatomy. Carotid stenosis measurements (when applicable) are obtained utilizing NASCET criteria, using the distal internal carotid diameter as the denominator. Multiphase CT imaging of the brain was performed following IV bolus contrast injection. Subsequent parametric perfusion maps were calculated using RAPID software. CONTRAST:  193mL ISOVUE-370 IOPAMIDOL (ISOVUE-370) INJECTION 76% COMPARISON:  CT head without contrast from the same day. FINDINGS: CTA NECK FINDINGS Aortic arch: A 3 vessel arch configuration is present. There is minimal calcification at the origin of the left subclavian artery. Additional calcifications are present in the distal arch without aneurysm or stenosis. Right carotid system: The right common carotid artery is within normal limits. Atherosclerotic calcifications are present at the bifurcation. There is no  significant stenosis. Cervical right ICA is within normal limits. Left carotid system: Is the left common carotid artery is within normal limits. Atherosclerotic changes are present at the bifurcation. There is no significant stenosis relative to the more distal vessel. The cervical left ICA is unremarkable. Vertebral arteries: The left vertebral artery is dominant to the right. Both vertebral arteries originate from the subclavian arteries without significant stenosis. There tortuosity in the proximal left vertebral artery. No stenoses are present in either vertebral artery throughout the neck. Skeleton: Grade 1 anterolisthesis is present at C3-4. There is chronic loss of disc height at C4-5, C5-6, and C6-7. Other neck: The soft tissues the neck are otherwise unremarkable. Salivary glands are within normal limits bilaterally. No focal mucosal or submucosal lesions are present. The thyroid is within normal limits. There is no significant adenopathy. Upper chest: Interlobular septal thickening is present. There is no focal consolidation. Review of the MIP images confirms the above findings CTA HEAD FINDINGS Anterior circulation: Atherosclerotic irregularity is present in the cavernous and precavernous internal carotid arteries bilaterally. There is no significant stenosis through the ICA termini. The right A1 segment is hypoplastic. Anterior communicating artery is patent. Both A2 segments fill. M1 segments are within normal limits. MCA bifurcations are normal. Is small vessel attenuation is present in the MCA branch vessels bilaterally without a significant proximal stenosis or occlusion. Posterior circulation: The left vertebral artery is the dominant vessel. PICA origins are visualized and normal bilaterally. The basilar artery is within normal limits. Both posterior cerebral arteries originate from the basilar tip. The PCA branch vessels are within normal limits. Venous sinuses: Dural sinuses are patent. The  right transverse sinus is dominant. Straight sinus deep cerebral veins are intact. Cortical  veins are unremarkable. Anatomic variants: None. Review of the MIP images confirms the above findings CT Brain Perfusion Findings: CBF (<30%) Volume: 84mL Perfusion (Tmax>6.0s) volume: 46mL A remote left occipital lobe infarct is noted. IMPRESSION: 1. Mild atherosclerotic changes at the origin of the left subclavian artery and at the carotid bifurcations bilaterally without significant stenosis relative to the more distal vessels. 2. Atherosclerotic irregularity within the cavernous and precavernous internal carotid arteries bilaterally without significant stenosis. 3. Distal small vessel disease in the MCA branch vessels bilaterally without significant proximal stenosis, aneurysm, or branch vessel occlusion. 4. Remote left occipital lobe infarct. 5. Perfusion imaging demonstrates no acute infarct or significant ischemia. 6. Spondylosis of the cervical spine as described. Electronically Signed   By: San Morelle M.D.   On: 12/04/2017 20:47   Mr Brain Wo Contrast  Result Date: 12/05/2017 CLINICAL DATA:  Fall with gait abnormality.  Expressive aphasia. EXAM: MRI HEAD WITHOUT CONTRAST TECHNIQUE: Multiplanar, multiecho pulse sequences of the brain and surrounding structures were obtained without intravenous contrast. COMPARISON:  Head CT 12/04/2017 FINDINGS: Brain: The midline structures are normal. There is no acute infarct or acute hemorrhage. No mass lesion, hydrocephalus, dural abnormality or extra-axial collection. There is periventricular white matter hyperintensity consistent with chronic small vessel disease. Generalized volume loss with ex vacuo dilatation of the ventricles. No chronic microhemorrhage or superficial siderosis. Vascular: Major intracranial arterial and venous sinus flow voids are preserved. Skull and upper cervical spine: The visualized skull base, calvarium, upper cervical spine and  extracranial soft tissues are normal. Sinuses/Orbits: No fluid levels or advanced mucosal thickening. No mastoid or middle ear effusion. Normal orbits. IMPRESSION: Generalized volume loss and sequelae of chronic ischemic microangiopathy without acute intracranial abnormality. Electronically Signed   By: Ulyses Jarred M.D.   On: 12/05/2017 20:00   Ct Cerebral Perfusion W Contrast  Result Date: 12/04/2017 CLINICAL DATA:  Focal neuro deficit for greater than 6 hours. EXAM: CT ANGIOGRAPHY HEAD AND NECK CT PERFUSION BRAIN TECHNIQUE: Multidetector CT imaging of the head and neck was performed using the standard protocol during bolus administration of intravenous contrast. Multiplanar CT image reconstructions and MIPs were obtained to evaluate the vascular anatomy. Carotid stenosis measurements (when applicable) are obtained utilizing NASCET criteria, using the distal internal carotid diameter as the denominator. Multiphase CT imaging of the brain was performed following IV bolus contrast injection. Subsequent parametric perfusion maps were calculated using RAPID software. CONTRAST:  156mL ISOVUE-370 IOPAMIDOL (ISOVUE-370) INJECTION 76% COMPARISON:  CT head without contrast from the same day. FINDINGS: CTA NECK FINDINGS Aortic arch: A 3 vessel arch configuration is present. There is minimal calcification at the origin of the left subclavian artery. Additional calcifications are present in the distal arch without aneurysm or stenosis. Right carotid system: The right common carotid artery is within normal limits. Atherosclerotic calcifications are present at the bifurcation. There is no significant stenosis. Cervical right ICA is within normal limits. Left carotid system: Is the left common carotid artery is within normal limits. Atherosclerotic changes are present at the bifurcation. There is no significant stenosis relative to the more distal vessel. The cervical left ICA is unremarkable. Vertebral arteries: The left  vertebral artery is dominant to the right. Both vertebral arteries originate from the subclavian arteries without significant stenosis. There tortuosity in the proximal left vertebral artery. No stenoses are present in either vertebral artery throughout the neck. Skeleton: Grade 1 anterolisthesis is present at C3-4. There is chronic loss of disc height at C4-5, C5-6, and C6-7.  Other neck: The soft tissues the neck are otherwise unremarkable. Salivary glands are within normal limits bilaterally. No focal mucosal or submucosal lesions are present. The thyroid is within normal limits. There is no significant adenopathy. Upper chest: Interlobular septal thickening is present. There is no focal consolidation. Review of the MIP images confirms the above findings CTA HEAD FINDINGS Anterior circulation: Atherosclerotic irregularity is present in the cavernous and precavernous internal carotid arteries bilaterally. There is no significant stenosis through the ICA termini. The right A1 segment is hypoplastic. Anterior communicating artery is patent. Both A2 segments fill. M1 segments are within normal limits. MCA bifurcations are normal. Is small vessel attenuation is present in the MCA branch vessels bilaterally without a significant proximal stenosis or occlusion. Posterior circulation: The left vertebral artery is the dominant vessel. PICA origins are visualized and normal bilaterally. The basilar artery is within normal limits. Both posterior cerebral arteries originate from the basilar tip. The PCA branch vessels are within normal limits. Venous sinuses: Dural sinuses are patent. The right transverse sinus is dominant. Straight sinus deep cerebral veins are intact. Cortical veins are unremarkable. Anatomic variants: None. Review of the MIP images confirms the above findings CT Brain Perfusion Findings: CBF (<30%) Volume: 37mL Perfusion (Tmax>6.0s) volume: 65mL A remote left occipital lobe infarct is noted. IMPRESSION: 1.  Mild atherosclerotic changes at the origin of the left subclavian artery and at the carotid bifurcations bilaterally without significant stenosis relative to the more distal vessels. 2. Atherosclerotic irregularity within the cavernous and precavernous internal carotid arteries bilaterally without significant stenosis. 3. Distal small vessel disease in the MCA branch vessels bilaterally without significant proximal stenosis, aneurysm, or branch vessel occlusion. 4. Remote left occipital lobe infarct. 5. Perfusion imaging demonstrates no acute infarct or significant ischemia. 6. Spondylosis of the cervical spine as described. Electronically Signed   By: San Morelle M.D.   On: 12/04/2017 20:47   TTE  Study Conclusions - Left ventricle: The cavity size was normal. Wall thickness was increased in a pattern of mild LVH. Systolic function was normal. The estimated ejection fraction was in the range of 60% to 65%. Wall motion was normal; there were no regional wall motion abnormalities. Doppler parameters are consistent with abnormal left ventricular relaxation (grade 1 diastolic dysfunction). - Aortic valve: There was no stenosis. - Mitral valve: Mildly calcified annulus. There was no significant regurgitation. - Left atrium: The atrium was mildly to moderately dilated. - Right ventricle: The cavity size was normal. Systolic function was normal. - Pulmonary arteries: No complete TR doppler jet so unable to estimate PA systolic pressure. - Inferior vena cava: The vessel was normal in size. The respirophasic diameter changes were in the normal range (>= 50%), consistent with normal central venous pressure. Impressions:- Normal LV size with mild LV hypertrophy. EF 60-65%. Normal RV size and systolic function. No significant valvular abnormalities.    Component     Latest Ref Rng & Units 12/04/2017 12/05/2017  Cholesterol     0 - 200 mg/dL  168  Triglycerides      <150 mg/dL  46  HDL Cholesterol     >40 mg/dL  45  Total CHOL/HDL Ratio     RATIO  3.7  VLDL     0 - 40 mg/dL  9  LDL (calc)     0 - 99 mg/dL  114 (H)  Hemoglobin A1C     4.8 - 5.6 % 5.4   Mean Plasma Glucose     mg/dL  108.28   Vitamin B12     180 - 914 pg/mL  367  RPR     Non Reactive  Non Reactive  TSH     0.350 - 4.500 uIU/mL  2.537   Neuropsychic testing Dr. Rica Mote 03/21/18 significantly impaired performance across multiple measures and domains on extensive test battery probably major neurocognitive disorder like Alzheimer's  Assessment:   Ms Nifong is a 77 y.o. Caucasian female with PMH of HTN, HLD, progressive cognitive decline for the last 2-3 years admitted on 12/04/17 after a fall and difficulty walking.  She was found to have profound anomia, disorientation, simultanagnosia, optic ataxia and ocular apraxia, consistent Balint syndrome.   , MRI no acute infarct.  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 She seems to be tolerating Namenda pretty well and has not had significant cognitive worsening since her recommend trying to push the dose further to 28 mg if tolerated. We will consider changing to Namzaric after that She will return for follow-up in 2 months or call earlier if necessary I spent more than 35 minutes of face to face time with the patient. Greater than 50% of time was spent in counseling and coordination of care about her posterior variant Alzheimer's We discussed about balint syndrome, neuropsych testing, mental exercise,  and trying a higher dose of namenda.    No orders of the defined types were placed in this encounter.   Meds ordered this encounter  Medications  . memantine (NAMENDA XR) 28 MG CP24 24 hr capsule    Sig: Take 1 capsule (28 mg total) by mouth daily.    Dispense:  30 capsule    Refill:  Ral[place], MD [facility] [address] [place], [place] [address] [phone]

## 2018-05-11 NOTE — Patient Instructions (Signed)
I had a long discussion with the patient and husband regarding her significant cognitive impairment and dementia which seems predominantly related to her visual-spatial skills and language which may be more consistent with posterior cortical atrophy variant of Alzheimer's. She seems to be tolerating Namenda pretty well and has not had significant cognitive worsening since her recommend trying to push the dose further to 28 mg if tolerated. We will consider changing to Namzaric after that She will return for follow-up in 2 months or call earlier if necessary

## 2018-05-16 ENCOUNTER — Other Ambulatory Visit: Payer: Self-pay

## 2018-05-16 ENCOUNTER — Other Ambulatory Visit: Payer: Self-pay | Admitting: Neurology

## 2018-05-16 ENCOUNTER — Telehealth: Payer: Self-pay | Admitting: Neurology

## 2018-05-16 DIAGNOSIS — M049 Autoinflammatory syndrome, unspecified: Secondary | ICD-10-CM | POA: Diagnosis not present

## 2018-05-16 DIAGNOSIS — I10 Essential (primary) hypertension: Secondary | ICD-10-CM | POA: Diagnosis not present

## 2018-05-16 DIAGNOSIS — Z9181 History of falling: Secondary | ICD-10-CM | POA: Diagnosis not present

## 2018-05-16 DIAGNOSIS — Z8673 Personal history of transient ischemic attack (TIA), and cerebral infarction without residual deficits: Secondary | ICD-10-CM | POA: Diagnosis not present

## 2018-05-16 DIAGNOSIS — R41841 Cognitive communication deficit: Secondary | ICD-10-CM | POA: Diagnosis not present

## 2018-05-16 MED ORDER — MEMANTINE HCL ER 28 MG PO CP24: 28.0000 mg | ORAL_CAPSULE | Freq: Every day | ORAL | 2 refills | Status: DC

## 2018-05-16 NOTE — Telephone Encounter (Signed)
Rn call patients pharmacy about husband stating medication is not cover under her insurance. Tammy Arnold stated the 10mg  was cover. The pts insurance does not give a alternate medication for the generic namenda 28mg . Rn stated a message will be sent to Dr. Leonie Man.

## 2018-05-16 NOTE — Telephone Encounter (Addendum)
Rn receive call from husband that pts insurance does not cover the 28mg  daily pill of namenda Xr. The husband stated it would of cost  130.00 per month.They did not want to pay that price,and this was the generic brand. The husband stated pt was stable on the 10 mg bid namenda xr prior. The pt is not getting worse. The husband is giving pt the namenda xr 10mg  as the following:  Pt is taking one 10mg  and 5mg  in the am. He is cutting the medication in half to make the dosage 15mg  in the am.  In the pm he is giving the pt the whole 10mg  pill. The total dosage is 25mg  per day. He wanted DR.Leonie Man know what he is doing so it would be close enough for 28mg . Rn stated a message will be sent to Dr. Leonie Man.   The husband has enough meds for pt. When he is about to run out he will call for refills.

## 2018-05-16 NOTE — Telephone Encounter (Signed)
Left vm for patients husband to call back. RN wanted to know about the old rx of namenda 25mg , it was not seen on her medication list.

## 2018-05-16 NOTE — Telephone Encounter (Signed)
No that dose has not been studied,Instea GO BACK TO PRIOR DOSE OF NAMENDA 10 MG TWICE DAILY AND ADD ARICEPT 5 MG DAILY X 1 MONTH AND THEN 10 MG DAILY.aSK HIM IF HE IS WILLING I WILL ORDER

## 2018-05-16 NOTE — Telephone Encounter (Signed)
[name] husband Tom (on [place]) calling stating memantine (NAMENDA XR) 28 MG CP24 24 hr capsule is not covered by insurance. Dr. Leonie Man increased dosage at last visit.  He says he can give patient the old Rx taking 25mg  a day. Please call and discuss.

## 2018-05-17 ENCOUNTER — Other Ambulatory Visit: Payer: Self-pay | Admitting: Neurology

## 2018-05-17 MED ORDER — MEMANTINE HCL 10 MG PO TABS: 10.0000 mg | ORAL_TABLET | Freq: Two times a day (BID) | ORAL | 3 refills | Status: DC

## 2018-05-17 MED ORDER — DONEPEZIL HCL 10 MG PO TABS: 10.0000 mg | ORAL_TABLET | Freq: Every day | ORAL | 3 refills | Status: DC

## 2018-05-17 NOTE — Telephone Encounter (Signed)
MEssage sent to Dr. Leonie Man to put aricpet orders  In.

## 2018-05-17 NOTE — Telephone Encounter (Signed)
Ok will order

## 2018-05-17 NOTE — Telephone Encounter (Signed)
RN call patients husband back about Dr. Leonie Man recommendation. Rn stated per Dr. Leonie Man note there has been no study on the regimen he is giving his wife. RN stated Dr.Sethi wants him to give her just 10mg  namenda Xr in the am,and 10mg  in the pm. He would like to add aricept in the am, with her taking 5mg  in the am for 30 days, and 10mg  in the am. PTs husband was okay with doing aricpet. Rn stated a message will be sent to Dr. Leonie Man, to put the order in. PTs husband verbalized understanding.

## 2018-05-17 NOTE — Telephone Encounter (Signed)
Pts husband Marcello Moores returning Rns call

## 2018-05-17 NOTE — Telephone Encounter (Signed)
Left vm for patients husband to call back about Dr. Leonie Man recommendation of the medication regimen he is giving his wife.

## 2018-05-18 NOTE — Telephone Encounter (Signed)
Rn receive call from pts husband about aricept. Rn stated a follow up call was made. Rn stated the aricept was sent to the pharmacy with her taking 1/2 tablet for 30 days at [facility]. After 30 days take whole 10mg  ongoing. RN stated the 10mg  will have to be cut in half for the first 30 days. Also she should start back taking the namenda 10mg  Xr twice a day with the aricpet.

## 2018-05-18 NOTE — Telephone Encounter (Signed)
Left vm for patients husband to call back about the aricept being at the  Pharmacy.

## 2018-05-20 DIAGNOSIS — M049 Autoinflammatory syndrome, unspecified: Secondary | ICD-10-CM | POA: Diagnosis not present

## 2018-05-20 DIAGNOSIS — R41841 Cognitive communication deficit: Secondary | ICD-10-CM | POA: Diagnosis not present

## 2018-05-20 DIAGNOSIS — I10 Essential (primary) hypertension: Secondary | ICD-10-CM | POA: Diagnosis not present

## 2018-05-20 DIAGNOSIS — Z9181 History of falling: Secondary | ICD-10-CM | POA: Diagnosis not present

## 2018-05-20 DIAGNOSIS — Z8673 Personal history of transient ischemic attack (TIA), and cerebral infarction without residual deficits: Secondary | ICD-10-CM | POA: Diagnosis not present

## 2018-05-23 DIAGNOSIS — R41841 Cognitive communication deficit: Secondary | ICD-10-CM | POA: Diagnosis not present

## 2018-05-23 DIAGNOSIS — Z9181 History of falling: Secondary | ICD-10-CM | POA: Diagnosis not present

## 2018-05-23 DIAGNOSIS — M049 Autoinflammatory syndrome, unspecified: Secondary | ICD-10-CM | POA: Diagnosis not present

## 2018-05-23 DIAGNOSIS — Z8673 Personal history of transient ischemic attack (TIA), and cerebral infarction without residual deficits: Secondary | ICD-10-CM | POA: Diagnosis not present

## 2018-05-23 DIAGNOSIS — I10 Essential (primary) hypertension: Secondary | ICD-10-CM | POA: Diagnosis not present

## 2018-06-01 DIAGNOSIS — M049 Autoinflammatory syndrome, unspecified: Secondary | ICD-10-CM | POA: Diagnosis not present

## 2018-06-01 DIAGNOSIS — Z9181 History of falling: Secondary | ICD-10-CM | POA: Diagnosis not present

## 2018-06-01 DIAGNOSIS — R41841 Cognitive communication deficit: Secondary | ICD-10-CM | POA: Diagnosis not present

## 2018-06-01 DIAGNOSIS — Z8673 Personal history of transient ischemic attack (TIA), and cerebral infarction without residual deficits: Secondary | ICD-10-CM | POA: Diagnosis not present

## 2018-06-01 DIAGNOSIS — I10 Essential (primary) hypertension: Secondary | ICD-10-CM | POA: Diagnosis not present

## 2018-06-08 DIAGNOSIS — Z9181 History of falling: Secondary | ICD-10-CM | POA: Diagnosis not present

## 2018-06-08 DIAGNOSIS — Z8673 Personal history of transient ischemic attack (TIA), and cerebral infarction without residual deficits: Secondary | ICD-10-CM | POA: Diagnosis not present

## 2018-06-08 DIAGNOSIS — M049 Autoinflammatory syndrome, unspecified: Secondary | ICD-10-CM | POA: Diagnosis not present

## 2018-06-08 DIAGNOSIS — I10 Essential (primary) hypertension: Secondary | ICD-10-CM | POA: Diagnosis not present

## 2018-06-08 DIAGNOSIS — R41841 Cognitive communication deficit: Secondary | ICD-10-CM | POA: Diagnosis not present

## 2018-06-13 DIAGNOSIS — Z8673 Personal history of transient ischemic attack (TIA), and cerebral infarction without residual deficits: Secondary | ICD-10-CM | POA: Diagnosis not present

## 2018-06-13 DIAGNOSIS — R41841 Cognitive communication deficit: Secondary | ICD-10-CM | POA: Diagnosis not present

## 2018-06-13 DIAGNOSIS — I10 Essential (primary) hypertension: Secondary | ICD-10-CM | POA: Diagnosis not present

## 2018-06-13 DIAGNOSIS — Z9181 History of falling: Secondary | ICD-10-CM | POA: Diagnosis not present

## 2018-06-13 DIAGNOSIS — M049 Autoinflammatory syndrome, unspecified: Secondary | ICD-10-CM | POA: Diagnosis not present

## 2018-06-20 DIAGNOSIS — R41841 Cognitive communication deficit: Secondary | ICD-10-CM | POA: Diagnosis not present

## 2018-06-20 DIAGNOSIS — I10 Essential (primary) hypertension: Secondary | ICD-10-CM | POA: Diagnosis not present

## 2018-06-20 DIAGNOSIS — Z8673 Personal history of transient ischemic attack (TIA), and cerebral infarction without residual deficits: Secondary | ICD-10-CM | POA: Diagnosis not present

## 2018-06-20 DIAGNOSIS — Z9181 History of falling: Secondary | ICD-10-CM | POA: Diagnosis not present

## 2018-06-20 DIAGNOSIS — M049 Autoinflammatory syndrome, unspecified: Secondary | ICD-10-CM | POA: Diagnosis not present

## 2018-06-27 DIAGNOSIS — R41841 Cognitive communication deficit: Secondary | ICD-10-CM | POA: Diagnosis not present

## 2018-06-27 DIAGNOSIS — Z8673 Personal history of transient ischemic attack (TIA), and cerebral infarction without residual deficits: Secondary | ICD-10-CM | POA: Diagnosis not present

## 2018-06-27 DIAGNOSIS — Z9181 History of falling: Secondary | ICD-10-CM | POA: Diagnosis not present

## 2018-06-27 DIAGNOSIS — M049 Autoinflammatory syndrome, unspecified: Secondary | ICD-10-CM | POA: Diagnosis not present

## 2018-06-27 DIAGNOSIS — I10 Essential (primary) hypertension: Secondary | ICD-10-CM | POA: Diagnosis not present

## 2018-07-04 DIAGNOSIS — Z8673 Personal history of transient ischemic attack (TIA), and cerebral infarction without residual deficits: Secondary | ICD-10-CM | POA: Diagnosis not present

## 2018-07-04 DIAGNOSIS — I10 Essential (primary) hypertension: Secondary | ICD-10-CM | POA: Diagnosis not present

## 2018-07-04 DIAGNOSIS — R41841 Cognitive communication deficit: Secondary | ICD-10-CM | POA: Diagnosis not present

## 2018-07-04 DIAGNOSIS — Z9181 History of falling: Secondary | ICD-10-CM | POA: Diagnosis not present

## 2018-07-04 DIAGNOSIS — M049 Autoinflammatory syndrome, unspecified: Secondary | ICD-10-CM | POA: Diagnosis not present

## 2018-07-11 DIAGNOSIS — I10 Essential (primary) hypertension: Secondary | ICD-10-CM | POA: Diagnosis not present

## 2018-07-11 DIAGNOSIS — M049 Autoinflammatory syndrome, unspecified: Secondary | ICD-10-CM | POA: Diagnosis not present

## 2018-07-11 DIAGNOSIS — Z8673 Personal history of transient ischemic attack (TIA), and cerebral infarction without residual deficits: Secondary | ICD-10-CM | POA: Diagnosis not present

## 2018-07-11 DIAGNOSIS — Z9181 History of falling: Secondary | ICD-10-CM | POA: Diagnosis not present

## 2018-07-11 DIAGNOSIS — R41841 Cognitive communication deficit: Secondary | ICD-10-CM | POA: Diagnosis not present

## 2018-07-18 ENCOUNTER — Encounter: Payer: Self-pay | Admitting: Neurology

## 2018-07-18 ENCOUNTER — Ambulatory Visit (INDEPENDENT_AMBULATORY_CARE_PROVIDER_SITE_OTHER): Payer: Medicare Other | Admitting: Neurology

## 2018-07-18 VITALS — BP 160/60 | HR 63 | Wt 121.6 lb

## 2018-07-18 DIAGNOSIS — H547 Unspecified visual loss: Secondary | ICD-10-CM

## 2018-07-18 DIAGNOSIS — F0280 Dementia in other diseases classified elsewhere without behavioral disturbance: Secondary | ICD-10-CM | POA: Diagnosis not present

## 2018-07-18 DIAGNOSIS — G319 Degenerative disease of nervous system, unspecified: Secondary | ICD-10-CM | POA: Diagnosis not present

## 2018-07-18 NOTE — Patient Instructions (Signed)
I had a long discussion with the patient and her husband regarding her memory loss and cognitive impairment from her posterior cortical atrophy variant of Alzheimer's and answered questions.  Continue Aricept 10 mg daily and Namenda 10 mg twice daily and the current dosages.  She will return for follow-up in the future in 6 months or call earlier if necessary

## 2018-07-18 NOTE — Progress Notes (Signed)
STROKE NEUROLOGY FOLLOW UP NOTE  NAME: Tammy Arnold DOB: April 21, 1941  REASON FOR VISIT: stroke follow up HISTORY FROM: Husband and chart  Today we had the pleasure of seeing Tammy Arnold in follow-up at our Neurology Clinic. Pt was accompanied by husband.   History Summary Ms. Tammy Arnold is a 77 y.o. female with history of HTN, HLD, cognitive decline admitted on 12/04/17 after a fall and difficulty walking.  Patient has progressive cognitive decline for the last 2-3 years however the difficulty walking was acute after a fall and consider related to back injury during the fall.  She received PT/OT and discharged to [facility].  In terms of her cognitive decline, she was found to have profound anomia, disorientation, simultanagnosia, optic ataxia and ocular apraxia, consistent Balint syndrome.  Recommend outpatient follow-up with neurology.  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  CTA head neck and CT perfusion unremarkable.  EF 60-65%.  No PFO.  LDL 114 and A1c 5.4, TSH and B12 WNL and RPR neg.  Dr Erlinda Hong 02/02/2018 : During the interval time, the patient has been doing better.  She was able to walk without assistant, feed herself, lnear back to baseline of her physical activity.  Still has mild back pain in the morning but much better in the afternoon.  Also some improvement of cognition, including optic ataxia and ocular apraxia, however still have profound anomia, disorientation, visual spatial deficit and simultanagnosia.  Cannot write, cannot remember well. Update 05/11/2018 : Ms. Tammy Arnold returns for follow-up today after last visit with Dr. Starleen Blue 3 months ago. She is accompanied by husband. He feels that she may have shown some improvement in her optic ataxia and speech and language. However she continues to have significant impaired memory and at times she cannot complete sentences and is searching for words. She did undergo detailed neuropsych testing on 03/21/18 by  Dr. Rica Mote and was found to have profound impairment in multiple areas of cognitive function in a pattern compatible with major neuro psychological disorder like Alzheimer's the patient's husband feels that her sense of humor is still good. She has no trouble learning new information and needs constant reminders. She can carry out simple social conversations. She still gets a right and left-sided mixed up and has trouble with visual spatial orientation a lot. She requires constant supervision. Her behavior has been quite calm. There is no agitation. She does not have delusions or hallucinations or any unsafe for violent behavior. She has tolerated Namenda 10 mg twice daily but the husband is not sure whether it is particularly helpful. He however agrees that she has not progressed or gotten worse. Update 07/18/2018 ;  She returns for follow-up after last visit 2 months ago.  She is accompanied by her husband.  They have been unable to afford Namzaric on Namenda XR because insurance did not cover it.  She has started Aricept instead and is tolerating 10 mg daily without any nausea, vomiting, diarrhea or dizziness.  Her cognitive difficulties appear unchanged and are no better or worse.  Behavior is quite calm and she can be easily redirected.  She has no problems with agitation, delusions, hallucinations or unsafe behaviors.   REVIEW OF SYSTEMS: Full 14 system review of systems performed and notable only for those listed below and in HPI above, all others are negative:  and all other systems negative   The following represents the patient's updated allergies and side effects list: Allergies  Allergen Reactions  . Baclofen Other (See Comments)    Caused brain dysfunction  . Nitrofurantoin Monohyd Macro   . Keflex [Cephalexin] Rash  . Macrodantin [Nitrofurantoin] Rash  . Neggram [Nalidixic Acid] Rash  . Penicillins Rash    Has patient had a PCN reaction causing immediate rash, facial/tongue/throat  swelling, SOB or lightheadedness with hypotension: Yes Has patient had a PCN reaction causing severe rash involving mucus membranes or skin necrosis: No Has patient had a PCN reaction that required hospitalization: No Has patient had a PCN reaction occurring within the last 10 years: No If all of the above answers are "NO", then may proceed with Cephalosporin use.  . Sulfa Antibiotics Rash    The neurologically relevant items on the patient's problem list were reviewed on today's visit.  Neurologic Examination  A problem focused neurological exam (12 or more points of the single system neurologic examination, vital signs counts as 1 point, cranial nerves count for 8 points) was performed.  Blood pressure (!) 160/60, pulse 63, weight 121 lb 9.6 oz (55.2 kg).  General - thin built, well developed, in no apparent distress,   Ophthalmologic - fundi not visualized due to noncooperation.  Cardiovascular - Regular rate and rhythm with no murmur.  Musculoskeletal - left shoulder lower than right due to chronic scoliosis  Mental Status -  Awake alert, orientated to self and people, knows Tuesday, but not orientated to age, place, or time. Language exam showed spontaneous speech, able to follow one step command and able to repeat, however, profound anomia, not able to follow two-step commands, difficulty with left and right differentiation. Attention span and concentration exam showed not able to backward spelling and not able to calculate. Recent and remote memory were impaired with 0/3 delayed recall. Fund of Knowledge was assessed and was impaired. Patient unable to complete Mini-Mental status exam.  She was able to name only 2 animals with 4 legs.  Clock drawing 1/4.  She was unable to copy intersecting pentagons.  She could barely write her name and could not read very well.    Cranial Nerves II - XII - II - Visual field exam showed simultanagnosia with pictures.  III, IV, VI -  Extraocular movements intact with smooth pursuit and spontaneous saccades. V - Facial sensation intact bilaterally. VII - Facial movement intact bilaterally. VIII - Hearing & vestibular intact bilaterally. X - Palate elevates symmetrically. XI - Chin turning & shoulder shrug intact bilaterally. XII - Tongue protrusion intact.  Motor Strength - The patient's strength was normal in all extremities and pronator drift was absent.  Bulk was normal and fasciculations were absent.   Motor Tone - Muscle tone was assessed at the neck and appendages and was normal.  Reflexes - The patient's reflexes were symmetrical in all extremities and she had no pathological reflexes.  Sensory - Light touch, temperature/pinprick were assessed and were symmetrical.    Coordination - The patient had dysmetria BUEs.  Tremor was absent.  Gait and Station - stooped posturing, scoliosis                                                        Neuropsychic testing Dr. Rica Mote 03/21/18 significantly impaired performance across multiple measures and domains on extensive test battery probably major neurocognitive disorder like Alzheimer's  Assessment:  Ms Castilla is a 77 y.o. Caucasian female with PMH of HTN, HLD, progressive cognitive decline for the last 2-3 years admitted on 12/04/17 after a fall and difficulty walking.  She was found to have profound anomia, disorientation, simultanagnosia, optic ataxia and ocular apraxia, consistent Balint syndrome.   , MRI no acute infarct.  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  Continue Aricept 10 mg  daily and Namenda 10 mg twice daily and the current dosages.  She will return for follow-up in the future in 6 months or call earlier if necessary Greater than 50% of time was spent in counseling and coordination of care about her posterior variant Alzheimer's We discussed about balint syndrome, neuropsych testing, mental exercise, and trying a higher dose of namenda.    No orders of the defined types were placed in this encounter.   No orders of the defined types were placed in this encounter.       Antony Contras, MD [facility] [address], [place], [place] [address] [phone]

## 2018-07-27 DIAGNOSIS — M810 Age-related osteoporosis without current pathological fracture: Secondary | ICD-10-CM | POA: Diagnosis not present

## 2018-08-11 ENCOUNTER — Other Ambulatory Visit: Payer: Self-pay | Admitting: Neurology

## 2018-08-11 DIAGNOSIS — Z23 Encounter for immunization: Secondary | ICD-10-CM | POA: Diagnosis not present

## 2018-11-09 DIAGNOSIS — Z1389 Encounter for screening for other disorder: Secondary | ICD-10-CM | POA: Diagnosis not present

## 2018-11-09 DIAGNOSIS — M1990 Unspecified osteoarthritis, unspecified site: Secondary | ICD-10-CM | POA: Diagnosis not present

## 2018-11-09 DIAGNOSIS — I69993 Ataxia following unspecified cerebrovascular disease: Secondary | ICD-10-CM | POA: Diagnosis not present

## 2018-11-09 DIAGNOSIS — E7800 Pure hypercholesterolemia, unspecified: Secondary | ICD-10-CM | POA: Diagnosis not present

## 2018-11-09 DIAGNOSIS — Z0000 Encounter for general adult medical examination without abnormal findings: Secondary | ICD-10-CM | POA: Diagnosis not present

## 2018-11-09 DIAGNOSIS — E559 Vitamin D deficiency, unspecified: Secondary | ICD-10-CM | POA: Diagnosis not present

## 2018-11-09 DIAGNOSIS — R413 Other amnesia: Secondary | ICD-10-CM | POA: Diagnosis not present

## 2018-11-09 DIAGNOSIS — I10 Essential (primary) hypertension: Secondary | ICD-10-CM | POA: Diagnosis not present

## 2018-11-09 DIAGNOSIS — M4800 Spinal stenosis, site unspecified: Secondary | ICD-10-CM | POA: Diagnosis not present

## 2018-11-09 DIAGNOSIS — M810 Age-related osteoporosis without current pathological fracture: Secondary | ICD-10-CM | POA: Diagnosis not present

## 2018-11-28 DIAGNOSIS — Z1211 Encounter for screening for malignant neoplasm of colon: Secondary | ICD-10-CM | POA: Diagnosis not present

## 2019-01-16 ENCOUNTER — Ambulatory Visit: Payer: Medicare Other | Admitting: Neurology

## 2019-02-21 ENCOUNTER — Other Ambulatory Visit: Payer: Self-pay | Admitting: Neurology

## 2019-05-08 DIAGNOSIS — M810 Age-related osteoporosis without current pathological fracture: Secondary | ICD-10-CM | POA: Diagnosis not present

## 2019-05-29 DIAGNOSIS — I10 Essential (primary) hypertension: Secondary | ICD-10-CM | POA: Diagnosis not present

## 2019-05-29 DIAGNOSIS — R4189 Other symptoms and signs involving cognitive functions and awareness: Secondary | ICD-10-CM | POA: Diagnosis not present

## 2019-05-29 DIAGNOSIS — M810 Age-related osteoporosis without current pathological fracture: Secondary | ICD-10-CM | POA: Diagnosis not present

## 2019-05-29 DIAGNOSIS — I69993 Ataxia following unspecified cerebrovascular disease: Secondary | ICD-10-CM | POA: Diagnosis not present

## 2019-05-29 DIAGNOSIS — E7800 Pure hypercholesterolemia, unspecified: Secondary | ICD-10-CM | POA: Diagnosis not present

## 2019-07-16 ENCOUNTER — Other Ambulatory Visit: Payer: Self-pay | Admitting: Neurology

## 2019-07-17 ENCOUNTER — Ambulatory Visit (INDEPENDENT_AMBULATORY_CARE_PROVIDER_SITE_OTHER): Payer: Medicare Other | Admitting: Neurology

## 2019-07-17 ENCOUNTER — Encounter: Payer: Self-pay | Admitting: Neurology

## 2019-07-17 ENCOUNTER — Other Ambulatory Visit: Payer: Self-pay

## 2019-07-17 VITALS — BP 155/71 | HR 55 | Temp 97.8°F | Wt 138.4 lb

## 2019-07-17 DIAGNOSIS — F0280 Dementia in other diseases classified elsewhere without behavioral disturbance: Secondary | ICD-10-CM | POA: Diagnosis not present

## 2019-07-17 DIAGNOSIS — G301 Alzheimer's disease with late onset: Secondary | ICD-10-CM

## 2019-07-17 MED ORDER — MEMANTINE HCL 10 MG PO TABS: 10.0000 mg | ORAL_TABLET | Freq: Two times a day (BID) | ORAL | 3 refills | Status: AC

## 2019-07-17 MED ORDER — DONEPEZIL HCL 10 MG PO TABS
ORAL_TABLET | ORAL | 3 refills | Status: DC
Start: 2019-07-17 — End: 2019-11-12

## 2019-07-17 NOTE — Patient Instructions (Signed)
I had a long discussion with the patient and her husband regarding her memory loss and cognitive impairment from her posterior cortical atrophy variant of Alzheimer's and answered questions.  Continue Aricept 10 mg daily and Namenda 10 mg twice daily.  Consider possible participation in the [place] dementia trial if interested.  He will be given information to review at home and decide she will return for follow-up in 1 year or call earlier if necessary

## 2019-07-17 NOTE — Progress Notes (Signed)
STROKE NEUROLOGY FOLLOW UP NOTE  NAME: Tammy Arnold DOB: 05-May-1941  REASON FOR VISIT: stroke follow up HISTORY FROM: Husband and chart  Today we had the pleasure of seeing Tammy Arnold in follow-up at our Neurology Clinic. Pt was accompanied by husband.   History Summary Tammy. Tammy Arnold is a 78 y.o. female with history of HTN, HLD, cognitive decline admitted on 12/04/17 after a fall and difficulty walking.  Patient has progressive cognitive decline for the last 2-3 years however the difficulty walking was acute after a fall and consider related to back injury during the fall.  She received PT/OT and discharged to CIR.  In terms of her cognitive decline, she was found to have profound anomia, disorientation, simultanagnosia, optic ataxia and ocular apraxia, consistent Balint syndrome.  Recommend outpatient follow-up with neurology.  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  CTA head neck and CT perfusion unremarkable.  EF 60-65%.  No PFO.  LDL 114 and A1c 5.4, TSH and B12 WNL and RPR neg.  Dr Erlinda Hong 02/02/2018 : During the interval time, the patient has been doing better.  She was able to walk without assistant, feed herself, lnear back to baseline of her physical activity.  Still has mild back pain in the morning but much better in the afternoon.  Also some improvement of cognition, including optic ataxia and ocular apraxia, however still have profound anomia, disorientation, visual spatial deficit and simultanagnosia.  Cannot write, cannot remember well. Update 05/11/2018 : Tammy. Tammy Arnold returns for follow-up today after last visit with Dr. Starleen Blue 3 months ago. She is accompanied by husband. He feels that she may have shown some improvement in her optic ataxia and speech and language. However she continues to have significant impaired memory and at times she cannot complete sentences and is searching for words. She did undergo detailed neuropsych testing on 03/21/18 by  Dr. Rica Mote and was found to have profound impairment in multiple areas of cognitive function in a pattern compatible with major neuro psychological disorder like Alzheimer's the patient's husband feels that her sense of humor is still good. She has no trouble learning new information and needs constant reminders. She can carry out simple social conversations. She still gets a right and left-sided mixed up and has trouble with visual spatial orientation a lot. She requires constant supervision. Her behavior has been quite calm. There is no agitation. She does not have delusions or hallucinations or any unsafe for violent behavior. She has tolerated Namenda 10 mg twice daily but the husband is not sure whether it is particularly helpful. He however agrees that she has not progressed or gotten worse. Update 07/18/2018 ;  She returns for follow-up after last visit 2 months ago.  She is accompanied by her husband.  They have been unable to afford Namzaric on Namenda XR because insurance did not cover it.  She has started Aricept instead and is tolerating 10 mg daily without any nausea, vomiting, diarrhea or dizziness.  Her cognitive difficulties appear unchanged and are no better or worse.  Behavior is quite calm and she can be easily redirected.  She has no problems with agitation, delusions, hallucinations or unsafe behaviors.  Update 07/17/2019 : She returns for follow-up after last visit a year ago.  She missed appointment for follow-up in March due to the [place] pandemic.  She is accompanied by her husband.  She is on Aricept 10 mg daily as well as Namenda 10 mg  twice daily she seems to be tolerating both medications well without any GI or CNS side effects or dizziness.  She continues to have visuospatial difficulties but can be redirected easily.  No issues with behavior, agitation, delusions hallucinations or unsafe behaviors.  She has trouble completing sentences at times and finding words.  No new  complaints.  Husband feels she is cognitively stable. REVIEW OF SYSTEMS: Full 14 system review of systems performed and notable only for those listed below and in HPI above, all others are negative:  and all other systems negative   The following represents the patient's updated allergies and side effects list: Allergies  Allergen Reactions   Baclofen Other (See Comments)    Caused brain dysfunction   Nitrofurantoin Monohyd Macro    Keflex [Cephalexin] Rash   Macrodantin [Nitrofurantoin] Rash   Neggram [Nalidixic Acid] Rash   Penicillins Rash    Has patient had a PCN reaction causing immediate rash, facial/tongue/throat swelling, SOB or lightheadedness with hypotension: Yes Has patient had a PCN reaction causing severe rash involving mucus membranes or skin necrosis: No Has patient had a PCN reaction that required hospitalization: No Has patient had a PCN reaction occurring within the last 10 years: No If all of the above answers are "NO", then may proceed with Cephalosporin use.   Sulfa Antibiotics Rash    The neurologically relevant items on the patient's problem list were reviewed on today's visit.  Neurologic Examination  A problem focused neurological exam (12 or more points of the single system neurologic examination, vital signs counts as 1 point, cranial nerves count for 8 points) was performed.  Blood pressure (!) 155/71, pulse (!) 55, temperature 97.8 F (36.6 C), weight 62.8 kg.  General - thin built, well developed, in no apparent distress,   Ophthalmologic - fundi not visualized due to noncooperation.  Cardiovascular - Regular rate and rhythm with no murmur.  Musculoskeletal - left shoulder lower than right due to chronic scoliosis  Mental Status -  Awake alert, orientated to self and people, knows day of the week, but not orientated to age, place, or time. Language exam showed spontaneous speech, able to follow one step command and able to repeat,  however, profound anomia, not able to follow two-step commands, difficulty with left and right differentiation. Attention span and concentration exam showed not able to backward spelling and not able to calculate. Recent and remote memory were impaired with 0/3 delayed recall. Fund of Knowledge was assessed and was impaired. Patient unable to complete Mini-Mental status exam.  She was able to name only 2 animals with 4 legs.  Clock drawing 1/4.  She was unable to copy intersecting pentagons.  She could barely write her name and could not read very well.    Cranial Nerves II - XII - II - Visual field exam showed simultanagnosia with pictures.  III, IV, VI - Extraocular movements intact with smooth pursuit and spontaneous saccades. V - Facial sensation intact bilaterally. VII - Facial movement intact bilaterally. VIII - Hearing & vestibular intact bilaterally. X - Palate elevates symmetrically. XI - Chin turning & shoulder shrug intact bilaterally. XII - Tongue protrusion intact.  Motor Strength - The patients strength was normal in all extremities and pronator drift was absent.  Bulk was normal and fasciculations were absent.   Motor Tone - Muscle tone was assessed at the neck and appendages and was normal.  Reflexes - The patients reflexes were symmetrical in all extremities and she had no pathological  reflexes.  Sensory - Light touch, temperature/pinprick were assessed and were symmetrical.    Coordination - The patient had dysmetria BUEs.  Tremor was absent.  Gait and Station - stooped posturing, scoliosis                                                        Neuropsychic testing Dr. Rica Mote 03/21/18 significantly impaired performance across multiple measures and domains on extensive test battery probably major neurocognitive disorder like Alzheimer's  Assessment:   Tammy Arnold is a 78 y.o. Caucasian female with PMH of HTN, HLD, progressive cognitive decline  for the last 2-3 years admitted on 12/04/17 after a fall and difficulty walking.  She was found to have profound anomia, disorientation, simultanagnosia, optic ataxia and ocular apraxia, consistent Balint syndrome.   , MRI no acute infarct.  Neuropsychic testing shows severe impaired performance across all domains suggestive of a major neurocognitive disorder like Alzheimer's but her clinical presentation suggests posterior cortical atrophy variant of Alzheimer's Plan:  I had a long discussion with the patient and her husband regarding her memory loss and cognitive impairment from her posterior cortical atrophy variant of Alzheimer's and answered questions.  Continue Aricept 10 mg daily and Namenda 10 mg twice daily.  Consider possible participation in the [place] dementia trial if interested.  He will be given information to review at home and decide she will return for follow-up in 1 year or call earlier if necessary Greater than 50% of time was spent in counseling and coordination of care about her posterior variant Alzheimer's We discussed about balint syndrome, neuropsych testing, mental exercise, and trying a higher dose of namenda.    No orders of the defined types were placed in this encounter.   Meds ordered this encounter  Medications   memantine (NAMENDA) 10 MG tablet    Sig: Take 1 tablet (10 mg total) by mouth 2 (two) times daily.    Dispense:  180 tablet    Refill:  3   donepezil (ARICEPT) 10 MG tablet    Sig: TAKE 1 TABLET BY MOUTH EVERYDAY AT BEDTIME    Dispense:  90 tablet    Refill:  3        Antony Contras, MD [facility] [address] [place], [place] [address] [phone]

## 2019-08-01 DIAGNOSIS — M79671 Pain in right foot: Secondary | ICD-10-CM | POA: Diagnosis not present

## 2019-08-21 ENCOUNTER — Other Ambulatory Visit: Payer: Self-pay

## 2019-08-21 DIAGNOSIS — Z20822 Contact with and (suspected) exposure to covid-19: Secondary | ICD-10-CM

## 2019-08-23 LAB — NOVEL CORONAVIRUS, NAA: SARS-CoV-2, NAA: NOT DETECTED

## 2019-11-02 DIAGNOSIS — N390 Urinary tract infection, site not specified: Secondary | ICD-10-CM | POA: Diagnosis not present

## 2019-11-02 DIAGNOSIS — R319 Hematuria, unspecified: Secondary | ICD-10-CM | POA: Diagnosis not present

## 2019-11-03 ENCOUNTER — Other Ambulatory Visit: Payer: Self-pay

## 2019-11-03 DIAGNOSIS — Z20822 Contact with and (suspected) exposure to covid-19: Secondary | ICD-10-CM

## 2019-11-04 LAB — NOVEL CORONAVIRUS, NAA: SARS-CoV-2, NAA: DETECTED — AB

## 2019-11-05 ENCOUNTER — Telehealth: Payer: Self-pay | Admitting: Family Medicine

## 2019-11-05 ENCOUNTER — Telehealth: Payer: Self-pay

## 2019-11-05 ENCOUNTER — Telehealth: Payer: Self-pay | Admitting: Critical Care Medicine

## 2019-11-05 NOTE — Telephone Encounter (Signed)
Patient husband called wanting to get further information about the patients medications. Please follow up.

## 2019-11-05 NOTE — Telephone Encounter (Signed)
Returned husbands call , he states the meds are the memantine (NAMENDA) 10 MG tablet and donepezil (ARICEPT) 10 MG tablet. He wants to know if these meds can cause urinary and bowel incontinence and also to inform that she is positive for COVID.  Doesn't want to schedule a fu at the moment needs to pay attention to schedule more to make sure he doesn't over book

## 2019-11-05 NOTE — Telephone Encounter (Signed)
Dr.Sethi reviewed pts chart and message from husband. He stated pt has been on medications for over 6 months. If she has experience bowel and bladder incontinence its likely from her having COVID 19 diagnosis. He anticipates pt will get better once she heals from the COVID 19 virus.He wants pt to continue medications and stay hydrated as much as possible.

## 2019-11-05 NOTE — Telephone Encounter (Signed)
I called pts husband about his wife having bowel and bladder incontinence in the last three days. He stated pt was attend [facility] and got it from there. Its a day program for adults. He stated the program is closed till February because they have had several cases of [place]. I stated Dr Leonie Man stated its likely pt is having these bowel and bladder incontinence while recuperating from [place] 19. I stated it would not be on the aricept and namenda because pt has been on meds for a while. The husband verbalized understanding and was told to make sure pt stays hydrated at all times.

## 2019-11-05 NOTE — Telephone Encounter (Signed)
Tried to call pts husband back someone answer the phone and did not say anything. I ask for Tammy Arnold several times but no said anything.

## 2019-11-05 NOTE — Telephone Encounter (Signed)
Patient called about Positive Covid test from date testing event. Patient spoke to Dr. Joya Gaskins previously who advised OTC analgesics and zinc. The patient has mild upper respiratory symptoms and no fever. Symptoms are mild at this time. Patient understands the needs to stay in isolation for a total of 10 days from onset of symptom or 14 days total from date of testing if no symptom. Patient knows the health department may be in touch.  I answered all of patient's questions and all concerns addressed.   Donia Pounds  APRN, MSN, FNP-C Patient [facility] [address] [place], [place] [address] [phone]

## 2019-11-05 NOTE — Telephone Encounter (Signed)
IF husband calls back please ask him which medications is he referring to. Also need to know his concerns about the medication. ALso pt never schedule a yearly follow up with Dr. Leonie Man which needs to be done 06/2020.   VM was left for pts husband.

## 2019-11-05 NOTE — Telephone Encounter (Signed)
I connected this patient who was Covid positive from a January 9 testing event.  She has been symptomatic since January 7.  She has had mild nasal congestion slight cough and is rapidly better.  While she is a monoclonal antibody candidate her symptoms are rapidly resolving.  She knows to stay in isolation until January 18 provided there is no fever 3 days prior

## 2019-11-06 ENCOUNTER — Other Ambulatory Visit: Payer: Medicare Other

## 2019-11-07 ENCOUNTER — Emergency Department (HOSPITAL_COMMUNITY): Payer: Medicare Other

## 2019-11-07 ENCOUNTER — Other Ambulatory Visit: Payer: Self-pay

## 2019-11-07 ENCOUNTER — Encounter (HOSPITAL_COMMUNITY): Payer: Self-pay | Admitting: Emergency Medicine

## 2019-11-07 ENCOUNTER — Inpatient Hospital Stay (HOSPITAL_COMMUNITY)
Admission: EM | Admit: 2019-11-07 | Discharge: 2019-11-12 | DRG: 178 | Disposition: A | Discharge status: Skilled Nursing Facility | Payer: Medicare Other | Attending: Internal Medicine | Admitting: Internal Medicine

## 2019-11-07 DIAGNOSIS — E785 Hyperlipidemia, unspecified: Secondary | ICD-10-CM | POA: Diagnosis not present

## 2019-11-07 DIAGNOSIS — U071 COVID-19: Secondary | ICD-10-CM | POA: Diagnosis not present

## 2019-11-07 DIAGNOSIS — I700 Atherosclerosis of aorta: Secondary | ICD-10-CM | POA: Diagnosis not present

## 2019-11-07 DIAGNOSIS — R404 Transient alteration of awareness: Secondary | ICD-10-CM | POA: Diagnosis not present

## 2019-11-07 DIAGNOSIS — R29818 Other symptoms and signs involving the nervous system: Secondary | ICD-10-CM | POA: Diagnosis not present

## 2019-11-07 DIAGNOSIS — R2681 Unsteadiness on feet: Secondary | ICD-10-CM

## 2019-11-07 DIAGNOSIS — Z79899 Other long term (current) drug therapy: Secondary | ICD-10-CM

## 2019-11-07 DIAGNOSIS — G9340 Encephalopathy, unspecified: Secondary | ICD-10-CM | POA: Diagnosis not present

## 2019-11-07 DIAGNOSIS — I10 Essential (primary) hypertension: Secondary | ICD-10-CM | POA: Diagnosis present

## 2019-11-07 DIAGNOSIS — F0390 Unspecified dementia without behavioral disturbance: Secondary | ICD-10-CM | POA: Diagnosis present

## 2019-11-07 DIAGNOSIS — E876 Hypokalemia: Secondary | ICD-10-CM | POA: Diagnosis present

## 2019-11-07 DIAGNOSIS — R5381 Other malaise: Secondary | ICD-10-CM | POA: Diagnosis not present

## 2019-11-07 DIAGNOSIS — R4189 Other symptoms and signs involving cognitive functions and awareness: Secondary | ICD-10-CM

## 2019-11-07 DIAGNOSIS — Z8673 Personal history of transient ischemic attack (TIA), and cerebral infarction without residual deficits: Secondary | ICD-10-CM

## 2019-11-07 DIAGNOSIS — Z96659 Presence of unspecified artificial knee joint: Secondary | ICD-10-CM | POA: Diagnosis present

## 2019-11-07 DIAGNOSIS — E7800 Pure hypercholesterolemia, unspecified: Secondary | ICD-10-CM | POA: Diagnosis present

## 2019-11-07 DIAGNOSIS — R269 Unspecified abnormalities of gait and mobility: Secondary | ICD-10-CM | POA: Diagnosis not present

## 2019-11-07 DIAGNOSIS — R32 Unspecified urinary incontinence: Secondary | ICD-10-CM | POA: Diagnosis present

## 2019-11-07 DIAGNOSIS — R531 Weakness: Secondary | ICD-10-CM | POA: Diagnosis not present

## 2019-11-07 DIAGNOSIS — D72819 Decreased white blood cell count, unspecified: Secondary | ICD-10-CM | POA: Diagnosis present

## 2019-11-07 LAB — BASIC METABOLIC PANEL
Anion gap: 11 (ref 5–15)
BUN: 13 mg/dL (ref 8–23)
CO2: 21 mmol/L — ABNORMAL LOW (ref 22–32)
Calcium: 8.7 mg/dL — ABNORMAL LOW (ref 8.9–10.3)
Chloride: 105 mmol/L (ref 98–111)
Creatinine, Ser: 0.75 mg/dL (ref 0.44–1.00)
GFR calc Af Amer: >60 mL/min (ref >60)
GFR calc non Af Amer: >60 mL/min (ref >60)
Glucose, Bld: 139 mg/dL — ABNORMAL HIGH (ref 70–99)
Potassium: 3.3 mmol/L — ABNORMAL LOW (ref 3.5–5.1)
Sodium: 137 mmol/L (ref 135–145)

## 2019-11-07 LAB — URINALYSIS, ROUTINE W REFLEX MICROSCOPIC
Bacteria, UA: NONE SEEN
Bilirubin Urine: NEGATIVE
Glucose, UA: NEGATIVE mg/dL
Hgb urine dipstick: NEGATIVE
Ketones, ur: 20 mg/dL — AB
Leukocytes,Ua: NEGATIVE
Nitrite: NEGATIVE
Protein, ur: 100 mg/dL — AB
Specific Gravity, Urine: 1.018 (ref 1.005–1.030)
pH: 5 (ref 5.0–8.0)

## 2019-11-07 LAB — LACTIC ACID, PLASMA
Lactic Acid, Venous: 2 mmol/L (ref 0.5–1.9)
Lactic Acid, Venous: 1.3 mmol/L (ref 0.5–1.9)

## 2019-11-07 LAB — TRIGLYCERIDES: Triglycerides: 61 mg/dL (ref <150)

## 2019-11-07 LAB — CBC
HCT: 43.6 % (ref 36.0–46.0)
Hemoglobin: 14.6 g/dL (ref 12.0–15.0)
MCH: 29.6 pg (ref 26.0–34.0)
MCHC: 33.5 g/dL (ref 30.0–36.0)
MCV: 88.4 fL (ref 80.0–100.0)
Platelets: 163 10*3/uL (ref 150–400)
RBC: 4.93 MIL/uL (ref 3.87–5.11)
RDW: 12.4 % (ref 11.5–15.5)
WBC: 3.8 10*3/uL — ABNORMAL LOW (ref 4.0–10.5)
nRBC: 0 % (ref 0.0–0.2)

## 2019-11-07 LAB — LACTATE DEHYDROGENASE: LDH: 295 U/L — ABNORMAL HIGH (ref 98–192)

## 2019-11-07 LAB — HEPATIC FUNCTION PANEL
ALT: 16 U/L (ref 0–44)
AST: 36 U/L (ref 15–41)
Albumin: 3.7 g/dL (ref 3.5–5.0)
Alkaline Phosphatase: 47 U/L (ref 38–126)
Bilirubin, Direct: 0.2 mg/dL (ref 0.0–0.2)
Indirect Bilirubin: 0.3 mg/dL (ref 0.3–0.9)
Total Bilirubin: 0.5 mg/dL (ref 0.3–1.2)
Total Protein: 7.4 g/dL (ref 6.5–8.1)

## 2019-11-07 LAB — FERRITIN: Ferritin: 101 ng/mL (ref 11–307)

## 2019-11-07 LAB — C-REACTIVE PROTEIN: CRP: 3.7 mg/dL — ABNORMAL HIGH (ref <1.0)

## 2019-11-07 LAB — LIPASE, BLOOD: Lipase: 30 U/L (ref 11–51)

## 2019-11-07 LAB — CBG MONITORING, ED: Glucose-Capillary: 132 mg/dL — ABNORMAL HIGH (ref 70–99)

## 2019-11-07 LAB — PROCALCITONIN: Procalcitonin: <0.1 ng/mL

## 2019-11-07 LAB — FIBRINOGEN: Fibrinogen: 586 mg/dL — ABNORMAL HIGH (ref 210–475)

## 2019-11-07 LAB — D-DIMER, QUANTITATIVE: D-Dimer, Quant: 1.47 ug/mL-FEU — ABNORMAL HIGH (ref 0.00–0.50)

## 2019-11-07 MED ORDER — POTASSIUM CHLORIDE IN NACL 40-0.9 MEQ/L-% IV SOLN
INTRAVENOUS | Status: AC
  Administered 2019-11-08: 50 mL/h via INTRAVENOUS
  Filled 2019-11-07: qty 1000

## 2019-11-07 MED ORDER — ACETAMINOPHEN 650 MG RE SUPP: 650.0000 mg | Freq: Four times a day (QID) | RECTAL | Status: DC | PRN

## 2019-11-07 MED ORDER — SODIUM CHLORIDE 0.9 % IV BOLUS
500.0000 mL | Freq: Once | INTRAVENOUS | Status: AC
  Administered 2019-11-07: 16:00:00 500 mL via INTRAVENOUS

## 2019-11-07 MED ORDER — ENOXAPARIN SODIUM 40 MG/0.4ML ~~LOC~~ SOLN
40.0000 mg | SUBCUTANEOUS | Status: DC
  Administered 2019-11-07 – 2019-11-11 (×5): 40 mg via SUBCUTANEOUS
  Filled 2019-11-07 (×5): qty 0.4

## 2019-11-07 MED ORDER — ZINC SULFATE 220 (50 ZN) MG PO CAPS
220.0000 mg | ORAL_CAPSULE | Freq: Every day | ORAL | Status: DC
  Administered 2019-11-08 – 2019-11-12 (×5): 220 mg via ORAL
  Filled 2019-11-07 (×5): qty 1

## 2019-11-07 MED ORDER — ACETAMINOPHEN 500 MG PO TABS
1000.0000 mg | ORAL_TABLET | Freq: Once | ORAL | Status: AC
  Administered 2019-11-07: 17:00:00 500 mg via ORAL
  Filled 2019-11-07: qty 2

## 2019-11-07 MED ORDER — GUAIFENESIN-DM 100-10 MG/5ML PO SYRP: 10.0000 mL | ORAL_SOLUTION | ORAL | Status: DC | PRN

## 2019-11-07 MED ORDER — ACETAMINOPHEN 325 MG PO TABS
650.0000 mg | ORAL_TABLET | Freq: Four times a day (QID) | ORAL | Status: DC | PRN
  Administered 2019-11-11: 10:00:00 650 mg via ORAL
  Filled 2019-11-07: qty 2

## 2019-11-07 MED ORDER — HYDROCOD POLST-CPM POLST ER 10-8 MG/5ML PO SUER: 5.0000 mL | Freq: Two times a day (BID) | ORAL | Status: DC | PRN

## 2019-11-07 MED ORDER — ASCORBIC ACID 500 MG PO TABS
500.0000 mg | ORAL_TABLET | Freq: Every day | ORAL | Status: DC
  Administered 2019-11-08 – 2019-11-12 (×5): 500 mg via ORAL
  Filled 2019-11-07 (×5): qty 1

## 2019-11-07 NOTE — ED Triage Notes (Signed)
Pt arrives via EMS from home with reports of being covid + on Saturday. Reports the pt has been incontinent of urine and stool starting Saturday. Denies any pain. Hx of dementia.

## 2019-11-07 NOTE — ED Notes (Signed)
Patient ambulated in room with assistance of this Tech. O2 Sats remained at 99% Room air.

## 2019-11-07 NOTE — H&P (Signed)
History and Physical    Tammy Arnold BPJ:[id number] DOB: 1941/01/21 DOA: 11/07/2019  PCP: Kelton Pillar, MD   Patient coming from: Home.  I have personally briefly reviewed patient's old medical records in [place]  Chief Complaint: Urinary incontinence.  HPI: Tammy Arnold is a 79 y.o. female with medical history significant of osteoarthritis, hyperlipidemia, hypertension, CVA who is brought to the emergency department due to generalized weakness, urinary and fecal incontinence and worsening dementia since Saturday (4 days ago).  On that day, the patient was also diagnosed with COVID-19 virus infection.  Earlier, the patient told Dr. Laverta Baltimore that she has been unable to ambulate on her own and is a lot more confused than usual.  She has not been drinking or eating much.  Last night, the patient had to sleep on the floor of the living room because she was unable to get up to get in bed.  Her husband, who happens to be a physician, call her PCP and was told to call 911.  She is currently satting in the high 90s on room air.  Her husband stated her earlier that she has not had any respiratory symptoms so far.  ED Course: Initial vital signs temperature 100 F, pulse 83, respirations 18, blood pressure 160/78 mmHg and O2 sat 99% on room air.  The patient received a 500 mL bolus in the emergency department.  Her urinalysis showed ketonuria 20 and proteinuria 100 mg/dL.  CBC showed a white count of 3.8, hemoglobin 14.6 g/dL and platelets 163.  Blood cultures x2 were drawn.  D-dimer was 1.46 and fibrinogen 586.  CRP 3.7, LDH 295.  Procalcitonin was less than 0.1 ng/mL.  Lactic acid was 2.0 and then 1.3 mmol/L.  LFTs and renal function were normal.  Potassium was 3.3 and CO2 21 mmol/L.  The rest of the electrolytes so far within normal limits when calcium is corrected to albumin.  Glucose 139 mg/dL.  Her chest radiograph was clear.  Review of Systems: As per HPI otherwise 10 point review of  systems negative.   Past Medical History:  Diagnosis Date  . Arthritis   . High cholesterol   . Hypertension   . Stroke [facility])     Past Surgical History:  Procedure Laterality Date  . CATARACT EXTRACTION  11/23/2017  . REPLACEMENT TOTAL KNEE       reports that she has never smoked. She has never used smokeless tobacco. She reports previous drug use. She reports that she does not drink alcohol.  Allergies  Allergen Reactions  . Baclofen Other (See Comments)    Caused brain dysfunction  . Nitrofurantoin Monohyd Macro   . Keflex [Cephalexin] Rash  . Macrodantin [Nitrofurantoin] Rash  . Neggram [Nalidixic Acid] Rash  . Penicillins Rash    Has patient had a PCN reaction causing immediate rash, facial/tongue/throat swelling, SOB or lightheadedness with hypotension: Yes Has patient had a PCN reaction causing severe rash involving mucus membranes or skin necrosis: No Has patient had a PCN reaction that required hospitalization: No Has patient had a PCN reaction occurring within the last 10 years: No If all of the above answers are "NO", then may proceed with Cephalosporin use.  . Sulfa Antibiotics Rash    Family History  Problem Relation Age of Onset  . Dementia Mother    Prior to Admission medications   Medication Sig Start Date End Date Taking? Authorizing Provider  donepezil (ARICEPT) 10 MG tablet TAKE 1 TABLET BY MOUTH EVERYDAY  AT BEDTIME 07/17/19   Garvin Fila, MD  lisinopril (PRINIVIL,ZESTRIL) 2.5 MG tablet Take 1 tablet (2.5 mg total) by mouth daily. 12/28/17   Love, Ivan Anchors, PA-C  memantine (NAMENDA) 10 MG tablet Take 1 tablet (10 mg total) by mouth 2 (two) times daily. 07/17/19   Garvin Fila, MD  pravastatin (PRAVACHOL) 20 MG tablet Take 1 tablet (20 mg total) by mouth daily at 6 PM. 12/07/17   Geradine Girt, DO    Physical Exam: Vitals:   11/07/19 1830 11/07/19 1900 11/07/19 1930 11/07/19 2000  BP: (!) 148/57 (!) 121/59 (!) 148/64 (!) 147/60  Pulse: 61 61 69  (!) 58  Resp: '16 15 15 17  ' Temp:      TempSrc:      SpO2: 98% 97% 95% 97%    Constitutional: NAD, calm, comfortable Eyes: PERRL, lids and conjunctivae normal ENMT: Mucous membranes are mildly dry. Posterior pharynx clear of any exudate or lesions. Neck: normal, supple, no masses, no thyromegaly Respiratory: clear to auscultation bilaterally, no wheezing, no crackles. Normal respiratory effort. No accessory muscle use.  Cardiovascular: Regular rate and rhythm, no murmurs / rubs / gallops. No extremity edema. 2+ pedal pulses. No carotid bruits.  Abdomen: Soft, no tenderness, no masses palpated. No hepatosplenomegaly. Bowel sounds positive.  Musculoskeletal: no clubbing / cyanosis. Good ROM, no contractures. Normal muscle tone.  Skin: Mild erythema on lower extremities. Neurologic: CN 2-12 grossly intact. Sensation intact, DTR normal. Strength 5/5 in all 4.  Psychiatric: Oriented to name only.  Disoriented to place, situation, time and date.  Labs on Admission: I have personally reviewed following labs and imaging studies  CBC: Recent Labs  Lab 11/07/19 1338  WBC 3.8*  HGB 14.6  HCT 43.6  MCV 88.4  PLT 854   Basic Metabolic Panel: Recent Labs  Lab 11/07/19 1338  NA 137  K 3.3*  CL 105  CO2 21*  GLUCOSE 139*  BUN 13  CREATININE 0.75  CALCIUM 8.7*   GFR: CrCl cannot be calculated (Unknown ideal weight.). Liver Function Tests: Recent Labs  Lab 11/07/19 1606  AST 36  ALT 16  ALKPHOS 47  BILITOT 0.5  PROT 7.4  ALBUMIN 3.7   Recent Labs  Lab 11/07/19 1606  LIPASE 30   No results for input(s): AMMONIA in the last 168 hours. Coagulation Profile: No results for input(s): INR, PROTIME in the last 168 hours. Cardiac Enzymes: No results for input(s): CKTOTAL, CKMB, CKMBINDEX, TROPONINI in the last 168 hours. BNP (last 3 results) No results for input(s): PROBNP in the last 8760 hours. HbA1C: No results for input(s): HGBA1C in the last 72 hours. CBG: Recent Labs   Lab 11/07/19 1522  GLUCAP 132*   Lipid Profile: Recent Labs    11/07/19 1606  TRIG 61   Thyroid Function Tests: No results for input(s): TSH, T4TOTAL, FREET4, T3FREE, THYROIDAB in the last 72 hours. Anemia Panel: Recent Labs    11/07/19 1606  FERRITIN 101   Urine analysis:    Component Value Date/Time   COLORURINE YELLOW 11/07/2019 [place] 11/07/2019 1606   LABSPEC 1.018 11/07/2019 1606   PHURINE 5.0 11/07/2019 1606   GLUCOSEU NEGATIVE 11/07/2019 1606   HGBUR NEGATIVE 11/07/2019 1606   BILIRUBINUR NEGATIVE 11/07/2019 1606   KETONESUR 20 (A) 11/07/2019 1606   PROTEINUR 100 (A) 11/07/2019 1606   UROBILINOGEN 0.2 08/25/2007 0835   NITRITE NEGATIVE 11/07/2019 1606   LEUKOCYTESUR NEGATIVE 11/07/2019 1606    Radiological Exams  on Admission: CT Head Wo Contrast  Result Date: 11/07/2019 CLINICAL DATA:  Neuro deficit, acute, stroke suspected EXAM: CT HEAD WITHOUT CONTRAST TECHNIQUE: Contiguous axial images were obtained from the base of the skull through the vertex without intravenous contrast. COMPARISON:  MRI 12/06/2017, CT head 12/04/2017, perfusion study 12/05/2017 FINDINGS: Brain: Generalized ventricular enlargement has increased from comparison exam and appears somewhat out of proportion to the degree of sulcal dilatation. No evidence of acute infarction, hemorrhage, extra-axial collection or mass lesion/mass effect. Confluent areas of white matter hypoattenuation are most compatible with chronic microvascular angiopathy. Vascular: Atherosclerotic calcification of the carotid siphons and intradural vertebral arteries. No hyperdense vessel. Skull: No calvarial fracture or suspicious osseous lesion. No scalp swelling or hematoma. Sinuses/Orbits: Mild mural thickening in the ethmoids, left maxillary, and sphenoid sinuses without air-fluid levels. Mastoid air cells are predominantly clear. Debris noted in both external auditory canals. Prior left lens extraction. No  other gross orbital abnormality. Other: None IMPRESSION: 1. Generalized ventricular enlargement has increased from comparison exam and appears somewhat out of proportion to the degree of sulcal dilatation. Findings could reflect developing normal pressure hydrocephaly on a background of ex vacuo dilatation and parenchymal volume loss. 2. No CT evidence of acute infarct. If persisting concern for acute infarction, consider MRI which is more sensitive and specific for early changes particularly given the background of chronic microvascular angiopathy. Electronically Signed   By: Lovena Le M.D.   On: 11/07/2019 21:55   DG Chest Port 1 View  Result Date: 11/07/2019 CLINICAL DATA:  79 year old female with positive COVID-19. EXAM: PORTABLE CHEST 1 VIEW COMPARISON:  Chest radiograph dated 12/07/2017. FINDINGS: No focal consolidation, pleural effusion or pneumothorax. The cardiac silhouette is within normal limits. Atherosclerotic calcification of the aorta. No acute osseous pathology. IMPRESSION: No active disease. Electronically Signed   By: Anner Crete M.D.   On: 11/07/2019 16:56    EKG: Independently reviewed.  Vent. rate 75 BPM PR interval * ms QRS duration 87 ms QT/QTc 409/457 ms P-R-T axes 81 85 79 Sinus rhythm Biatrial enlargement Borderline right axis deviation Nonspecific T abnrm, anterolateral  Assessment/Plan Principal Problem:   COVID-19 virus infection No oxygen requirement at this time. Has only gastrointestinal symptoms Gentle and time-limited IV hydration Supplemental oxygen and bronchodilators as needed. Remdesivir per pharmacy if criteria met. Follow CBC, CMP and inflammatory markers.  Active Problems:   Essential hypertension Hold lisinopril for now. Monitor blood pressure, renal function electrolytes.    HLD (hyperlipidemia) On pravastatin.    Hypokalemia Replacing. Check magnesium level. Follow-up potassium level.    Leukocytopenia Likely due to  coronavirus. Monitor WBC.   DVT prophylaxis: Lovenox SQ. Code Status: Full code. Family Communication: Disposition Plan: Observation for gentle IV hydration, lites replacement and symptoms treatment. Consults called: Admission status: Observation/telemetry.   Reubin Milan MD [facility]  If 7PM-7AM, please contact night-coverage [email or web address]  11/07/2019, 10:36 PM   This document was prepared using Dragon voice recognition software and may contain some unintended transcription errors.

## 2019-11-07 NOTE — ED Notes (Signed)
Tammy Arnold (Daughter#[phone]) called for an update.  Thank you

## 2019-11-07 NOTE — ED Notes (Signed)
CBG Results of 132 reported to [place], Therapist, sports.

## 2019-11-07 NOTE — ED Notes (Signed)
Zamariya Beeghly [phone] husband looking for an update on the pt

## 2019-11-07 NOTE — ED Provider Notes (Signed)
Emergency Department Provider Note   I have reviewed the triage vital signs and the nursing notes.   HISTORY  Chief Complaint Urinary Incontinence   HPI Tammy Arnold is a 79 y.o. female with PMH of HLD, HTN, prior CVA, and dementia presents to the emergency department with acute altered mental status in the setting of COVID-19 infection.  Patient was diagnosed with COVID-19 as an outpatient on Saturday.  She lives with her husband who is her primary caregiver.  Patient is unable to provide an extensive history due to her confusion.  I spoke with her husband by phone.  He states that she goes to a day center 4 days/week and the last week they were kept home with possible Covid at a different facility as a precaution.  He noticed that she developed some cough and runny nose so went to get tested on Saturday which came back positive for COVID 19.  He tells me that over the last several days she has acutely decompensated.  She has been unable to walk and is much more confused than normal.  She not been eating or drinking well.  She has developed both stool and urine incontinence which is not typical of her.  Last night she had to sleep on the floor of the living room because she could not get up to get in bed.  The patient's husband is a local physician and called the PCP office today and was directed to call 911.  Husband has not noticed any respiratory issues since COVID diagnosis.   Level 5 caveat: Dementia  Code Status: FULL CODE  Past Medical History:  Diagnosis Date  . Arthritis   . High cholesterol   . Hypertension   . Stroke [facility])     Patient Active Problem List   Diagnosis Date Noted  . COVID-19 virus infection 11/07/2019  . Leukocytopenia 11/07/2019  . Back pain 12/27/2017  . Small vessel disease, cerebrovascular   . 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 Ataxia 12/05/2017  . Cognitive impairment   . Abnormal MRI   . Blurry vision   . Hypokalemia   . Acute blood loss  anemia   . TIA (transient ischemic attack) 12/04/2017  . Essential hypertension 12/04/2017  . HLD (hyperlipidemia) 12/04/2017    Past Surgical History:  Procedure Laterality Date  . CATARACT EXTRACTION  11/23/2017  . REPLACEMENT TOTAL KNEE      Allergies Baclofen, Keflex [cephalexin], Macrodantin [nitrofurantoin], Neggram [nalidixic acid], Nitrofurantoin monohyd macro, Penicillins, and Sulfa antibiotics  Family History  Problem Relation Age of Onset  . Dementia Mother     Social History Social History   Tobacco Use  . Smoking status: Never Smoker  . Smokeless tobacco: Never Used  . Tobacco comment: smoked very little in college none since  Substance Use Topics  . Alcohol use: No    Alcohol/week: 0.0 standard drinks  . Drug use: Not Currently    Review of Systems  Level 5 caveat: AMS  ____________________________________________   PHYSICAL EXAM:  VITAL SIGNS: ED Triage Vitals  Enc Vitals Group     BP 11/07/19 1327 (!) 160/78     Pulse Rate 11/07/19 1327 83     Resp 11/07/19 1327 18     Temp 11/07/19 1327 100 F (37.8 C)     Temp Source 11/07/19 1327 Oral     SpO2 11/07/19 1327 99 %   Constitutional: Alert but confused. Well appearing and in no acute distress. Eyes: Conjunctivae are normal. .  Head: Atraumatic. Nose: No congestion/rhinnorhea. Mouth/Throat: Mucous membranes are slightly dry.  Neck: No stridor.   Cardiovascular: Normal rate, regular rhythm. Good peripheral circulation. Grossly normal heart sounds.   Respiratory: Normal respiratory effort.  No retractions. Lungs CTAB. Gastrointestinal: Soft and nontender. No distention.  Musculoskeletal: No gross deformities of extremities. Neurologic:  Normal speech and language. Slightly tremulous and globally weak. Able to ambulate with assistance with shuffling gait.  Skin:  Skin is warm, dry and intact. No rash noted.   ____________________________________________   LABS (all labs ordered are  listed, but only abnormal results are displayed)  Labs Reviewed  BASIC METABOLIC PANEL - Abnormal; Notable for the following components:      Result Value   Potassium 3.3 (*)    CO2 21 (*)    Glucose, Bld 139 (*)    Calcium 8.7 (*)    All other components within normal limits  CBC - Abnormal; Notable for the following components:   WBC 3.8 (*)    All other components within normal limits  URINALYSIS, ROUTINE W REFLEX MICROSCOPIC - Abnormal; Notable for the following components:   Ketones, ur 20 (*)    Protein, ur 100 (*)    All other components within normal limits  LACTIC ACID, PLASMA - Abnormal; Notable for the following components:   Lactic Acid, Venous 2.0 (*)    All other components within normal limits  D-DIMER, QUANTITATIVE (NOT AT [facility]) - Abnormal; Notable for the following components:   D-Dimer, Quant 1.47 (*)    All other components within normal limits  LACTATE DEHYDROGENASE - Abnormal; Notable for the following components:   LDH 295 (*)    All other components within normal limits  FIBRINOGEN - Abnormal; Notable for the following components:   Fibrinogen 586 (*)    All other components within normal limits  C-REACTIVE PROTEIN - Abnormal; Notable for the following components:   CRP 3.7 (*)    All other components within normal limits  C-REACTIVE PROTEIN - Abnormal; Notable for the following components:   CRP 3.1 (*)    All other components within normal limits  COMPREHENSIVE METABOLIC PANEL - Abnormal; Notable for the following components:   Glucose, Bld 114 (*)    Calcium 8.1 (*)    Total Protein 6.0 (*)    Albumin 3.1 (*)    All other components within normal limits  CBC WITH DIFFERENTIAL/PLATELET - Abnormal; Notable for the following components:   Platelets 144 (*)    All other components within normal limits  D-DIMER, QUANTITATIVE (NOT AT [facility]) - Abnormal; Notable for the following components:   D-Dimer, Quant 1.30 (*)    All other components within normal  limits  PHOSPHORUS - Abnormal; Notable for the following components:   Phosphorus 2.1 (*)    All other components within normal limits  CBG MONITORING, ED - Abnormal; Notable for the following components:   Glucose-Capillary 132 (*)    All other components within normal limits  CULTURE, BLOOD (ROUTINE X 2)  CULTURE, BLOOD (ROUTINE X 2)  URINE CULTURE  LACTIC ACID, PLASMA  PROCALCITONIN  FERRITIN  TRIGLYCERIDES  HEPATIC FUNCTION PANEL  LIPASE, BLOOD  MAGNESIUM  PHOSPHORUS  MAGNESIUM  FERRITIN  ABO/RH   ____________________________________________  EKG   EKG Interpretation  Date/Time:  Wednesday November 07 2019 15:31:03 EST Ventricular Rate:  75 PR Interval:    QRS Duration: 87 QT Interval:  409 QTC Calculation: 457 R Axis:   85 Text Interpretation: Sinus rhythm Biatrial  enlargement Borderline right axis deviation Nonspecific T abnrm, anterolateral leads No STEMI Confirmed by Nanda Quinton [id number]) on 11/07/2019 3:46:07 PM       ____________________________________________  RADIOLOGY  CT Head Wo Contrast  Result Date: 11/07/2019 CLINICAL DATA:  Neuro deficit, acute, stroke suspected EXAM: CT HEAD WITHOUT CONTRAST TECHNIQUE: Contiguous axial images were obtained from the base of the skull through the vertex without intravenous contrast. COMPARISON:  MRI 12/06/2017, CT head 12/04/2017, perfusion study 12/05/2017 FINDINGS: Brain: Generalized ventricular enlargement has increased from comparison exam and appears somewhat out of proportion to the degree of sulcal dilatation. No evidence of acute infarction, hemorrhage, extra-axial collection or mass lesion/mass effect. Confluent areas of white matter hypoattenuation are most compatible with chronic microvascular angiopathy. Vascular: Atherosclerotic calcification of the carotid siphons and intradural vertebral arteries. No hyperdense vessel. Skull: No calvarial fracture or suspicious osseous lesion. No scalp swelling or hematoma.  Sinuses/Orbits: Mild mural thickening in the ethmoids, left maxillary, and sphenoid sinuses without air-fluid levels. Mastoid air cells are predominantly clear. Debris noted in both external auditory canals. Prior left lens extraction. No other gross orbital abnormality. Other: None IMPRESSION: 1. Generalized ventricular enlargement has increased from comparison exam and appears somewhat out of proportion to the degree of sulcal dilatation. Findings could reflect developing normal pressure hydrocephaly on a background of ex vacuo dilatation and parenchymal volume loss. 2. No CT evidence of acute infarct. If persisting concern for acute infarction, consider MRI which is more sensitive and specific for early changes particularly given the background of chronic microvascular angiopathy. Electronically Signed   By: Lovena Le M.D.   On: 11/07/2019 21:55   MR BRAIN WO CONTRAST  Result Date: 11/08/2019 CLINICAL DATA:  Ataxia with stroke suspected EXAM: MRI HEAD WITHOUT CONTRAST TECHNIQUE: Multiplanar, multiecho pulse sequences of the brain and surrounding structures were obtained without intravenous contrast. COMPARISON:  Head CT from yesterday FINDINGS: Brain: No acute infarction, hemorrhage, obstructive hydrocephalus, extra-axial collection or mass lesion. Prominent ventriculomegaly without secondary signs of normal pressure hydrocephalus, likely advanced central predominant atrophy. Atrophy has notably progressed from 2019. Chronic small vessel ischemia in the deep periventricular white matter. No chronic blood products Vascular: Normal flow voids Skull and upper cervical spine: Negative for marrow lesion Sinuses/Orbits: Left cataract resection and moderate bilateral sinusitis. Other: Motion degraded to the degree that findings could be obscured. IMPRESSION: 1. No acute finding including infarct. 2. Atrophy that is advanced and progressive from 2019. 3. Chronic small vessel ischemia. 4. Moderately motion  degraded. Electronically Signed   By: Monte Fantasia M.D.   On: 11/08/2019 06:09   DG Chest Port 1 View  Result Date: 11/07/2019 CLINICAL DATA:  79 year old female with positive COVID-19. EXAM: PORTABLE CHEST 1 VIEW COMPARISON:  Chest radiograph dated 12/07/2017. FINDINGS: No focal consolidation, pleural effusion or pneumothorax. The cardiac silhouette is within normal limits. Atherosclerotic calcification of the aorta. No acute osseous pathology. IMPRESSION: No active disease. Electronically Signed   By: Anner Crete M.D.   On: 11/07/2019 16:56    ____________________________________________   PROCEDURES  Procedure(s) performed:   Procedures  None ____________________________________________   INITIAL IMPRESSION / ASSESSMENT AND PLAN / ED COURSE  Pertinent labs & imaging results that were available during my care of the patient were reviewed by me and considered in my medical decision making (see chart for details).   Patient presents to the emergency department with acute mental status change and generalized weakness in the setting of COVID-19 infection.  Initial basic lab work from  triage reviewed with no acute kidney injury or significant electrolyte derangement.  Blood sugar is normal.  Patient is febrile here but in no acute respiratory distress.  Following additional blood work and urine analysis along with CT scan of the head.  Will discuss with husband after labs resulted. Patient able to ambulate here with shuffling gait and no O2 desaturation.   Labs unremarkable. Inflammatory markers elevated as expected. Lactate trending from 2.0 to 1.3. Normal UA. Patient ambulatory here without respiratory difficulty or hypoxemia. CT head pending. Discussed labs and evaluation with husband. He would prefer to have her at home vs admit. Not requiring respiratory support at this time due to [place]. Discussed that this could change. He would prefer to have her return home if CT is negative  and come back if symptoms worsen.   CT with question of NPH. MRI ordered. Discussed with husband. With worsening AMS and unsteady gait will admit.   Discussed patient's case with [facility], Dr. Olevia Bowens to request admission. Patient and family (if present) updated with plan. Care transferred to [facility] service.  I reviewed all nursing notes, vitals, pertinent old records, EKGs, labs, imaging (as available).  ____________________________________________  FINAL CLINICAL IMPRESSION(S) / ED DIAGNOSES  Final diagnoses:  Encephalopathy  COVID-19  Gait instability     MEDICATIONS GIVEN DURING THIS VISIT:  Medications  0.9 % NaCl with KCl 40 mEq / L  infusion (50 mL/hr Intravenous Restarted 11/08/19 0640)  enoxaparin (LOVENOX) injection 40 mg (40 mg Subcutaneous Given 11/07/19 2254)  acetaminophen (TYLENOL) tablet 650 mg (has no administration in time range)    Or  acetaminophen (TYLENOL) suppository 650 mg (has no administration in time range)  ascorbic acid (VITAMIN C) tablet 500 mg (500 mg Oral Given 11/08/19 1006)  zinc sulfate capsule 220 mg (220 mg Oral Given 11/08/19 1006)  guaiFENesin-dextromethorphan (ROBITUSSIN DM) 100-10 MG/5ML syrup 10 mL (has no administration in time range)  chlorpheniramine-HYDROcodone (TUSSIONEX) 10-8 MG/5ML suspension 5 mL (has no administration in time range)  remdesivir 200 mg in sodium chloride 0.9% 250 mL IVPB (200 mg Intravenous New Bag/Given 11/08/19 0647)    Followed by  remdesivir 100 mg in sodium chloride 0.9 % 100 mL IVPB (has no administration in time range)  dexamethasone (DECADRON) injection 6 mg (6 mg Intravenous Given 11/08/19 1006)  phosphorus (K PHOS NEUTRAL) tablet 500 mg (500 mg Oral Given 11/08/19 1006)  sodium chloride 0.9 % bolus 500 mL (0 mLs Intravenous Stopped 11/07/19 1901)  acetaminophen (TYLENOL) tablet 1,000 mg (500 mg Oral Given 11/07/19 1647)  methylPREDNISolone sodium succinate (SOLU-MEDROL) 40 mg/mL injection 40 mg (40 mg Intravenous Given  11/08/19 0403)    Note:  This document was prepared using Dragon voice recognition software and may include unintentional dictation errors.  Nanda Quinton, MD, [facility]    Anguel Delapena, [facility], MD 11/08/19 1235

## 2019-11-08 ENCOUNTER — Observation Stay (HOSPITAL_COMMUNITY): Payer: Medicare Other

## 2019-11-08 DIAGNOSIS — E876 Hypokalemia: Secondary | ICD-10-CM | POA: Diagnosis present

## 2019-11-08 DIAGNOSIS — E7800 Pure hypercholesterolemia, unspecified: Secondary | ICD-10-CM | POA: Diagnosis present

## 2019-11-08 DIAGNOSIS — E785 Hyperlipidemia, unspecified: Secondary | ICD-10-CM | POA: Diagnosis present

## 2019-11-08 DIAGNOSIS — R531 Weakness: Secondary | ICD-10-CM

## 2019-11-08 DIAGNOSIS — D72819 Decreased white blood cell count, unspecified: Secondary | ICD-10-CM | POA: Diagnosis not present

## 2019-11-08 DIAGNOSIS — M6281 Muscle weakness (generalized): Secondary | ICD-10-CM | POA: Diagnosis not present

## 2019-11-08 DIAGNOSIS — I10 Essential (primary) hypertension: Secondary | ICD-10-CM

## 2019-11-08 DIAGNOSIS — Z7401 Bed confinement status: Secondary | ICD-10-CM | POA: Diagnosis not present

## 2019-11-08 DIAGNOSIS — R638 Other symptoms and signs concerning food and fluid intake: Secondary | ICD-10-CM | POA: Diagnosis not present

## 2019-11-08 DIAGNOSIS — D696 Thrombocytopenia, unspecified: Secondary | ICD-10-CM | POA: Diagnosis not present

## 2019-11-08 DIAGNOSIS — F0390 Unspecified dementia without behavioral disturbance: Secondary | ICD-10-CM | POA: Diagnosis present

## 2019-11-08 DIAGNOSIS — Z8673 Personal history of transient ischemic attack (TIA), and cerebral infarction without residual deficits: Secondary | ICD-10-CM | POA: Diagnosis not present

## 2019-11-08 DIAGNOSIS — R270 Ataxia, unspecified: Secondary | ICD-10-CM | POA: Diagnosis not present

## 2019-11-08 DIAGNOSIS — G9340 Encephalopathy, unspecified: Secondary | ICD-10-CM | POA: Diagnosis present

## 2019-11-08 DIAGNOSIS — R32 Unspecified urinary incontinence: Secondary | ICD-10-CM | POA: Diagnosis present

## 2019-11-08 DIAGNOSIS — R41841 Cognitive communication deficit: Secondary | ICD-10-CM | POA: Diagnosis not present

## 2019-11-08 DIAGNOSIS — G459 Transient cerebral ischemic attack, unspecified: Secondary | ICD-10-CM | POA: Diagnosis not present

## 2019-11-08 DIAGNOSIS — R2689 Other abnormalities of gait and mobility: Secondary | ICD-10-CM | POA: Diagnosis not present

## 2019-11-08 DIAGNOSIS — R2681 Unsteadiness on feet: Secondary | ICD-10-CM | POA: Diagnosis not present

## 2019-11-08 DIAGNOSIS — R410 Disorientation, unspecified: Secondary | ICD-10-CM | POA: Diagnosis not present

## 2019-11-08 DIAGNOSIS — U071 COVID-19: Secondary | ICD-10-CM | POA: Diagnosis present

## 2019-11-08 DIAGNOSIS — Z96659 Presence of unspecified artificial knee joint: Secondary | ICD-10-CM | POA: Diagnosis present

## 2019-11-08 DIAGNOSIS — Z79899 Other long term (current) drug therapy: Secondary | ICD-10-CM | POA: Diagnosis not present

## 2019-11-08 DIAGNOSIS — M2550 Pain in unspecified joint: Secondary | ICD-10-CM | POA: Diagnosis not present

## 2019-11-08 LAB — C-REACTIVE PROTEIN: CRP: 3.1 mg/dL — ABNORMAL HIGH (ref <1.0)

## 2019-11-08 LAB — COMPREHENSIVE METABOLIC PANEL
ALT: 19 U/L (ref 0–44)
AST: 34 U/L (ref 15–41)
Albumin: 3.1 g/dL — ABNORMAL LOW (ref 3.5–5.0)
Alkaline Phosphatase: 41 U/L (ref 38–126)
Anion gap: 11 (ref 5–15)
BUN: 13 mg/dL (ref 8–23)
CO2: 23 mmol/L (ref 22–32)
Calcium: 8.1 mg/dL — ABNORMAL LOW (ref 8.9–10.3)
Chloride: 106 mmol/L (ref 98–111)
Creatinine, Ser: 0.75 mg/dL (ref 0.44–1.00)
GFR calc Af Amer: >60 mL/min (ref >60)
GFR calc non Af Amer: >60 mL/min (ref >60)
Glucose, Bld: 114 mg/dL — ABNORMAL HIGH (ref 70–99)
Potassium: 3.5 mmol/L (ref 3.5–5.1)
Sodium: 140 mmol/L (ref 135–145)
Total Bilirubin: 0.9 mg/dL (ref 0.3–1.2)
Total Protein: 6 g/dL — ABNORMAL LOW (ref 6.5–8.1)

## 2019-11-08 LAB — CBC WITH DIFFERENTIAL/PLATELET
Abs Immature Granulocytes: 0.02 10*3/uL (ref 0.00–0.07)
Basophils Absolute: 0 10*3/uL (ref 0.0–0.1)
Basophils Relative: 0 %
Eosinophils Absolute: 0 10*3/uL (ref 0.0–0.5)
Eosinophils Relative: 0 %
HCT: 40.3 % (ref 36.0–46.0)
Hemoglobin: 13.7 g/dL (ref 12.0–15.0)
Immature Granulocytes: 0 %
Lymphocytes Relative: 18 %
Lymphs Abs: 0.9 10*3/uL (ref 0.7–4.0)
MCH: 29.3 pg (ref 26.0–34.0)
MCHC: 34 g/dL (ref 30.0–36.0)
MCV: 86.3 fL (ref 80.0–100.0)
Monocytes Absolute: 0.2 10*3/uL (ref 0.1–1.0)
Monocytes Relative: 3 %
Neutro Abs: 4.1 10*3/uL (ref 1.7–7.7)
Neutrophils Relative %: 79 %
Platelets: 144 10*3/uL — ABNORMAL LOW (ref 150–400)
RBC: 4.67 MIL/uL (ref 3.87–5.11)
RDW: 12.3 % (ref 11.5–15.5)
WBC: 5.2 10*3/uL (ref 4.0–10.5)
nRBC: 0 % (ref 0.0–0.2)

## 2019-11-08 LAB — PHOSPHORUS
Phosphorus: 3.7 mg/dL (ref 2.5–4.6)
Phosphorus: 2.1 mg/dL — ABNORMAL LOW (ref 2.5–4.6)

## 2019-11-08 LAB — FERRITIN: Ferritin: 89 ng/mL (ref 11–307)

## 2019-11-08 LAB — MAGNESIUM
Magnesium: 2 mg/dL (ref 1.7–2.4)
Magnesium: 1.8 mg/dL (ref 1.7–2.4)

## 2019-11-08 LAB — D-DIMER, QUANTITATIVE: D-Dimer, Quant: 1.3 ug/mL-FEU — ABNORMAL HIGH (ref 0.00–0.50)

## 2019-11-08 LAB — ABO/RH: ABO/RH(D): A POS

## 2019-11-08 MED ORDER — METHYLPREDNISOLONE SODIUM SUCC 40 MG IJ SOLR
40.0000 mg | Freq: Once | INTRAMUSCULAR | Status: AC
  Administered 2019-11-08: 04:00:00 40 mg via INTRAVENOUS
  Filled 2019-11-08: qty 1

## 2019-11-08 MED ORDER — SODIUM CHLORIDE 0.9 % IV SOLN
200.0000 mg | Freq: Once | INTRAVENOUS | Status: AC
  Administered 2019-11-08: 07:00:00 200 mg via INTRAVENOUS
  Filled 2019-11-08: qty 200

## 2019-11-08 MED ORDER — MEMANTINE HCL 10 MG PO TABS
10.0000 mg | ORAL_TABLET | Freq: Two times a day (BID) | ORAL | Status: DC
  Administered 2019-11-08 – 2019-11-12 (×9): 10 mg via ORAL
  Filled 2019-11-08 (×9): qty 1

## 2019-11-08 MED ORDER — PRAVASTATIN SODIUM 10 MG PO TABS
20.0000 mg | ORAL_TABLET | Freq: Every day | ORAL | Status: DC
  Administered 2019-11-08 – 2019-11-12 (×5): 20 mg via ORAL
  Filled 2019-11-08 (×5): qty 2

## 2019-11-08 MED ORDER — ALBUTEROL SULFATE HFA 108 (90 BASE) MCG/ACT IN AERS
1.0000 | INHALATION_SPRAY | RESPIRATORY_TRACT | Status: DC | PRN
  Filled 2019-11-08: qty 6.7

## 2019-11-08 MED ORDER — DEXAMETHASONE SODIUM PHOSPHATE 10 MG/ML IJ SOLN
6.0000 mg | INTRAMUSCULAR | Status: DC
  Administered 2019-11-08 – 2019-11-12 (×5): 6 mg via INTRAVENOUS
  Filled 2019-11-08 (×5): qty 1

## 2019-11-08 MED ORDER — SODIUM CHLORIDE 0.9 % IV SOLN
100.0000 mg | Freq: Every day | INTRAVENOUS | Status: AC
  Administered 2019-11-09 – 2019-11-12 (×4): 100 mg via INTRAVENOUS
  Filled 2019-11-08 (×4): qty 20

## 2019-11-08 MED ORDER — K PHOS MONO-SOD PHOS DI & MONO 155-852-130 MG PO TABS
500.0000 mg | ORAL_TABLET | Freq: Two times a day (BID) | ORAL | Status: AC
  Administered 2019-11-08 (×2): 500 mg via ORAL
  Filled 2019-11-08 (×2): qty 2

## 2019-11-08 MED ORDER — DONEPEZIL HCL 10 MG PO TABS
10.0000 mg | ORAL_TABLET | Freq: Every day | ORAL | Status: DC
  Administered 2019-11-08 – 2019-11-10 (×3): 10 mg via ORAL
  Filled 2019-11-08 (×3): qty 1

## 2019-11-08 NOTE — ED Notes (Signed)
Admitting MD in to assess pt at this time 

## 2019-11-08 NOTE — Progress Notes (Signed)
PROGRESS NOTE    Tammy Arnold  [id number] DOB: 12/18/1940 DOA: 11/07/2019 PCP: Kelton Pillar, MD  Brief Narrative:  HPI per Dr. Tennis Must on 11/06/2018 Tammy Arnold is a 79 y.o. female with medical history significant of osteoarthritis, hyperlipidemia, hypertension, CVA who is brought to the emergency department due to generalized weakness, urinary and fecal incontinence and worsening dementia since Saturday (4 days ago).  On that day, the patient was also diagnosed with COVID-19 virus infection.  Earlier, the patient told Dr. Laverta Baltimore that she has been unable to ambulate on her own and is a lot more confused than usual.  She has not been drinking or eating much.  Last night, the patient had to sleep on the floor of the living room because she was unable to get up to get in bed.  Her husband, who happens to be a physician, call her PCP and was told to call 911.  She is currently satting in the high 90s on room air.  Her husband stated her earlier that she has not had any respiratory symptoms so far.  ED Course: Initial vital signs temperature 100 F, pulse 83, respirations 18, blood pressure 160/78 mmHg and O2 sat 99% on room air.  The patient received a 500 mL bolus in the emergency department.  Her urinalysis showed ketonuria 20 and proteinuria 100 mg/dL.  CBC showed a white count of 3.8, hemoglobin 14.6 g/dL and platelets 163.  Blood cultures x2 were drawn.  D-dimer was 1.46 and fibrinogen 586.  CRP 3.7, LDH 295.  Procalcitonin was less than 0.1 ng/mL.  Lactic acid was 2.0 and then 1.3 mmol/L.  LFTs and renal function were normal.  Potassium was 3.3 and CO2 21 mmol/L.  The rest of the electrolytes so far within normal limits when calcium is corrected to albumin.  Glucose 139 mg/dL.  Her chest radiograph was clear.  **Interim History  Her appetite was a little bit better today but she remains significantly confused and demented.  Unable to tell me where she was, who she was, what month  or year we were in but was able to tell tell me the president.  Started on remdesivir and steroids and methylprednisolone will be stopped and dexamethasone will be initiated.  Inflammatory markers to be trended and PT OT to evaluate and treat along with a dietitian.  Assessment & Plan:   Principal Problem:   COVID-19 virus infection Active Problems:   Essential hypertension   HLD (hyperlipidemia)   Hypokalemia   Leukocytopenia  COVID-19 virus infection -Admitted as Observation but changed to Inpatient Telemetry given continued Treatment for COVID and because the patient is now spiking Temperatures -Had a TMax of 101.1 yesterday  -No oxygen requirement at this time. -Has only gastrointestinal symptoms and interestingly Husband reports incontinence -Given Gentle and time-limited IV hydration with NS + 40 mEQ of KCl at 50 mL/hr -CXR showed "No focal consolidation, pleural effusion or pneumothorax. The cardiac silhouette is within normal limits. Atherosclerotic calcification of the aorta. No acute osseous pathology." -Checked Baseline Inflammatory Markers and Trend Daily: Recent Labs    11/07/19 1606 11/08/19 0634  DDIMER 1.47* 1.30*  FERRITIN 101 89  LDH 295*  --   CRP 3.7* 3.1*  -PCT was <0.10 so no Role for Abx currently  -TG was 61 Lab Results  Component Value Date   SARSCOV2NAA Detected (A) 11/03/2019   [place] Not Detected 08/21/2019  -SpO2: 96 %; Provide Supplemental O2 via [place] if Necessary -Started Treatment  with 5 Days of Remdesivir and 10 Days of Steroids; Day 2/5 of Remdesivir and she received 40 mg of Solumedrol yesterday and this was changed to 6 mg of Dexamethasone and will continue for 10 days total -C/w Antitussives with Guaifenesin-Dextromethorphan 10 mL po q4hprn Cough and Chlorpheniramine-Hydrocodone 5 mL po q12hprn -Check Blood Cx x2 and showed NGTD at <24 Hours -Prone as much as possible if able -C/w Zinc 220 mg po Daily and Vitamin C  -Airborne and  Contact Precautions -C/w Albuterol 1-2 puff IH q4hprn Wheezing and SOB -Add Flutter Valve and Incentive Spirometry  -Continue to monitor and trend inflammatory markers and repeat chest x-ray imaging in the a.m.  Essential Hypertension -Hold lisinopril for now. -Continue to Monitor blood pressures per Protocol, renal function as well electrolytes. -Last BP was 112/72  HLD (hyperlipidemia) -On Pravastatin 20 mg po qHS which has been resumed  Acute Worsening Encephalopathy on Chronic Dementia -Progressive and worsening per husband and per husband report she was unable to walk and was much more confused than normal and has not been eating or drinking well and developed both stool and urine incontinence is not typical of her and she has been sleeping on the floor of the living because she cannot get out of bed -Head CT showed "Generalized ventricular enlargement has increased from comparison exam and appears somewhat out of proportion to the degree of sulcal dilatation. Findings could reflect developing normal pressure hydrocephaly on a background of ex vacuo dilatation and parenchymal volume loss. No CT evidence of acute infarct. If persisting concern for acute infarction, consider MRI which is more sensitive and specific for early changes particularly given the background of chronic microvascular angiopathy." -MRI showed "No acute finding including infarct. Atrophy that is advanced and progressive from 2019. Chronic small vessel ischemia.  Moderately motion degraded." -C/w Donepezil 10 mg po qHS and Memantine 10 mg po BID -Delirium Precautions  -PT/OT evaluation and treat   Hypokalemia -Was 3.3 on admission and Replacing. Now K+ is 3.5 -Replete with NS + KCl and also with po K Phos Neutral 500 mg po BID x2 -Mag Level wnL at 1.8 -Continue to Monitor and Replete as Necessary -Repeat CMP in AM   Leukopenia -Likely due to coronavirus. -Continue to Monitor WBC closely as this is improved and  WBC is now 5.2 -Repeat CBC in AM  Thrombocytopenia -[name] Platelet Count went from 163 -> 144 -Continue to Monitor for S/Sx of Bleeding -Repeat CBC in AM   Hypophosphatemia -[name] Phos Level was 2.1 -Replete with po K Phos Neutral 500 mg BID x2 -Continue to Monitor and Replete as Necessary -Repeat Phos Level in AM   Generalized Weakness in the setting of COVID-19 disease  Poor Po Intake -Obtain PT/OT Evaluations -Appreciate Nutritionist Consultation   DVT prophylaxis: Enoxaparin 40 mg sq q24h Code Status: FULL CODE  Family Communication: Discussed with Husband (Dr. Ulanda Edison) over the telephone Disposition Plan: Pending further Clinical Improvement, Evaluation by PT/OT, and Completion of Remdesivir Course   Consultants:   None   Procedures:  None  Antimicrobials:  - Anti-infectives (From admission, onward)   Start     Dose/Rate Route Frequency Ordered Stop   11/09/19 1000  remdesivir 100 mg in sodium chloride 0.9 % 100 mL IVPB     100 mg 200 mL/hr over 30 Minutes Intravenous Daily 11/08/19 0112 11/13/19 0959   11/08/19 0115  remdesivir 200 mg in sodium chloride 0.9% 250 mL IVPB     200 mg 580 mL/hr  over 30 Minutes Intravenous Once 11/08/19 0112 11/08/19 [id number]     Subjective: Seen and examined at bedside she is significantly demented and confused and only able to tell me her first name which is "Tammy Arnold".  Unable to tell me her husband's name, where she is, the month or the year but is able to tell me who the president of the [place] is.  I asked her how her appetite has been and she states it is a little bit better" but it appears she is eating most of her tray.  No chest pain, lightheadedness or dizziness.  Denies any shortness of breath but patient's husband did state that she had a slight cough which I did not appreciate.  No other concerns complaints at this time.  Objective: Vitals:   11/08/19 0100 11/08/19 0607 11/08/19 1000 11/08/19 1431  BP: (!)  165/92 (!) 156/60 (!) 142/65 112/72  Pulse: 73 81  81  Resp: 14 19  16   Temp: 98.7 F (37.1 C) 98.7 F (37.1 C) 98.6 F (37 C) 98.6 F (37 C)  TempSrc: Oral Oral Oral Oral  SpO2: 100% 95%  96%    Intake/Output Summary (Last 24 hours) at 11/08/2019 1515 Last data filed at 11/08/2019 1300 Gross per 24 hour  Intake 957.16 ml  Output --  Net 957.16 ml   There were no vitals filed for this visit.  Examination: Physical Exam:  Constitutional: Thin elderly Caucasian female who is pleasantly demented and got frustrated when she spilled her coffee Eyes: Lids and conjunctivae normal, sclerae anicteric  ENMT: External Ears, Nose appear normal. Grossly normal hearing. Neck: Appears normal, supple, no cervical masses, normal ROM, no appreciable thyromegaly; no Visible JVD Respiratory: Slightly diminished to auscultation bilaterally, no wheezing, rales, rhonchi or crackles. Normal respiratory effort and patient is not tachypenic. No accessory muscle use. Unlabored breathing   Cardiovascular: RRR, no murmurs / rubs / gallops. S1 and S2 auscultated. No extremity edema.  Abdomen: Soft, non-tender, non-distended. Bowel sounds positive.  GU: Deferred. Musculoskeletal: No clubbing / cyanosis of digits/nails. No joint deformity upper and lower extremities.  Skin: No rashes, lesions, ulcers on limited skin evaluation. No induration; Warm and dry.  Neurologic: CN 2-12 grossly intact with no focal deficits. Romberg sign and cerebellar reflexes not assessed.  Psychiatric: Impaired judgment and insight. Alert and oriented x 1 Normal mood and appropriate affect.   Data Reviewed: I have personally reviewed following labs and imaging studies  CBC: Recent Labs  Lab 11/07/19 1338 11/08/19 0634  WBC 3.8* 5.2  NEUTROABS  --  4.1  HGB 14.6 13.7  HCT 43.6 40.3  MCV 88.4 86.3  PLT 163 [id number]*   Basic Metabolic Panel: Recent Labs  Lab 11/07/19 1338 11/07/19 1606 11/08/19 0634  NA 137  --  140  K 3.3*   --  3.5  CL 105  --  106  CO2 21*  --  23  GLUCOSE 139*  --  114*  BUN 13  --  13  CREATININE 0.75  --  0.75  CALCIUM 8.7*  --  8.1*  MG  --  2.0 1.8  PHOS  --  3.7 2.1*   GFR: CrCl cannot be calculated (Unknown ideal weight.). Liver Function Tests: Recent Labs  Lab 11/07/19 1606 11/08/19 0634  AST 36 34  ALT 16 19  ALKPHOS 47 41  BILITOT 0.5 0.9  PROT 7.4 6.0*  ALBUMIN 3.7 3.1*   Recent Labs  Lab 11/07/19 1606  LIPASE 30  No results for input(s): AMMONIA in the last 168 hours. Coagulation Profile: No results for input(s): INR, PROTIME in the last 168 hours. Cardiac Enzymes: No results for input(s): CKTOTAL, CKMB, CKMBINDEX, TROPONINI in the last 168 hours. BNP (last 3 results) No results for input(s): PROBNP in the last 8760 hours. HbA1C: No results for input(s): HGBA1C in the last 72 hours. CBG: Recent Labs  Lab 11/07/19 1522  GLUCAP 132*   Lipid Profile: Recent Labs    11/07/19 1606  TRIG 61   Thyroid Function Tests: No results for input(s): TSH, T4TOTAL, FREET4, T3FREE, THYROIDAB in the last 72 hours. Anemia Panel: Recent Labs    11/07/19 1606 11/08/19 0634  FERRITIN 101 89   Sepsis Labs: Recent Labs  Lab 11/07/19 1606 11/07/19 1915  PROCALCITON <0.10  --   LATICACIDVEN 2.0* 1.3    Recent Results (from the past 240 hour(s))  Novel Coronavirus, NAA ([facility])     Status: Abnormal   Collection Time: 11/03/19 10:27 AM   Specimen: Nasopharyngeal(NP) swabs in vial transport medium   NASOPHARYNGE  SCREENIN  Result Value Ref Range Status   SARS-CoV-2, NAA Detected (A) Not Detected Final    Comment: This nucleic acid amplification test was developed and its performance characteristics determined by [employer]. Nucleic acid amplification tests include PCR and TMA. This test has not been FDA cleared or approved. This test has been authorized by FDA under an Emergency Use Authorization (EUA). This test is only authorized for the  duration of time the declaration that circumstances exist justifying the authorization of the emergency use of in vitro diagnostic tests for detection of SARS-CoV-2 virus and/or diagnosis of COVID-19 infection under section 564(b)(1) of the Act, 21 U.S.C. [id number]) (1), unless the authorization is terminated or revoked sooner. When diagnostic testing is negative, the possibility of a false negative result should be considered in the context of a patient's recent exposures and the presence of clinical signs and symptoms consistent with COVID-19. An individual without symptoms of COVID-19 and who is not shedding SARS-CoV-2 virus would  expect to have a negative (not detected) result in this assay.   Blood Culture (routine x 2)     Status: None (Preliminary result)   Collection Time: 11/07/19  4:10 PM   Specimen: BLOOD  Result Value Ref Range Status   Specimen Description BLOOD LEFT ANTECUBITAL  Final   Special Requests   Final    BOTTLES DRAWN AEROBIC AND ANAEROBIC Blood Culture adequate volume   Culture   Final    NO GROWTH < 24 HOURS Performed at [facility], [place] [address] [place], [place] [address]    Report Status PENDING  Incomplete  Blood Culture (routine x 2)     Status: None (Preliminary result)   Collection Time: 11/07/19  4:10 PM   Specimen: BLOOD LEFT HAND  Result Value Ref Range Status   Specimen Description BLOOD LEFT HAND  Final   Special Requests   Final    BOTTLES DRAWN AEROBIC AND ANAEROBIC Blood Culture results may not be optimal due to an inadequate volume of blood received in culture bottles   Culture   Final    NO GROWTH < 24 HOURS Performed at [facility], [place] [address] [place], [place] [address]    Report Status PENDING  Incomplete    Radiology Studies: CT Head Wo Contrast  Result Date: 11/07/2019 CLINICAL DATA:  Neuro deficit, acute, stroke suspected EXAM: CT HEAD WITHOUT CONTRAST TECHNIQUE: Contiguous  axial images were obtained from  the base of the skull through the vertex without intravenous contrast. COMPARISON:  MRI 12/06/2017, CT head 12/04/2017, perfusion study 12/05/2017 FINDINGS: Brain: Generalized ventricular enlargement has increased from comparison exam and appears somewhat out of proportion to the degree of sulcal dilatation. No evidence of acute infarction, hemorrhage, extra-axial collection or mass lesion/mass effect. Confluent areas of white matter hypoattenuation are most compatible with chronic microvascular angiopathy. Vascular: Atherosclerotic calcification of the carotid siphons and intradural vertebral arteries. No hyperdense vessel. Skull: No calvarial fracture or suspicious osseous lesion. No scalp swelling or hematoma. Sinuses/Orbits: Mild mural thickening in the ethmoids, left maxillary, and sphenoid sinuses without air-fluid levels. Mastoid air cells are predominantly clear. Debris noted in both external auditory canals. Prior left lens extraction. No other gross orbital abnormality. Other: None IMPRESSION: 1. Generalized ventricular enlargement has increased from comparison exam and appears somewhat out of proportion to the degree of sulcal dilatation. Findings could reflect developing normal pressure hydrocephaly on a background of ex vacuo dilatation and parenchymal volume loss. 2. No CT evidence of acute infarct. If persisting concern for acute infarction, consider MRI which is more sensitive and specific for early changes particularly given the background of chronic microvascular angiopathy. Electronically Signed   By: Lovena Le M.D.   On: 11/07/2019 21:55   MR BRAIN WO CONTRAST  Result Date: 11/08/2019 CLINICAL DATA:  Ataxia with stroke suspected EXAM: MRI HEAD WITHOUT CONTRAST TECHNIQUE: Multiplanar, multiecho pulse sequences of the brain and surrounding structures were obtained without intravenous contrast. COMPARISON:  Head CT from yesterday FINDINGS: Brain: No acute infarction, hemorrhage, obstructive  hydrocephalus, extra-axial collection or mass lesion. Prominent ventriculomegaly without secondary signs of normal pressure hydrocephalus, likely advanced central predominant atrophy. Atrophy has notably progressed from 2019. Chronic small vessel ischemia in the deep periventricular white matter. No chronic blood products Vascular: Normal flow voids Skull and upper cervical spine: Negative for marrow lesion Sinuses/Orbits: Left cataract resection and moderate bilateral sinusitis. Other: Motion degraded to the degree that findings could be obscured. IMPRESSION: 1. No acute finding including infarct. 2. Atrophy that is advanced and progressive from 2019. 3. Chronic small vessel ischemia. 4. Moderately motion degraded. Electronically Signed   By: Monte Fantasia M.D.   On: 11/08/2019 06:09   DG Chest Port 1 View  Result Date: 11/07/2019 CLINICAL DATA:  79 year old female with positive COVID-19. EXAM: PORTABLE CHEST 1 VIEW COMPARISON:  Chest radiograph dated 12/07/2017. FINDINGS: No focal consolidation, pleural effusion or pneumothorax. The cardiac silhouette is within normal limits. Atherosclerotic calcification of the aorta. No acute osseous pathology. IMPRESSION: No active disease. Electronically Signed   By: Anner Crete M.D.   On: 11/07/2019 16:56   Scheduled Meds: . vitamin C  500 mg Oral Daily  . dexamethasone (DECADRON) injection  6 mg Intravenous Q24H  . enoxaparin (LOVENOX) injection  40 mg Subcutaneous Q24H  . phosphorus  500 mg Oral BID  . zinc sulfate  220 mg Oral Daily   Continuous Infusions: . 0.9 % NaCl with KCl 40 mEq / L 50 mL/hr (11/08/19 0640)  . [START ON 11/09/2019] remdesivir 100 mg in NS 100 mL      LOS: 0 days    Kerney Elbe, DO [facility] PAGER is on [place]  If 7PM-7AM, please contact night-coverage [email or web address]

## 2019-11-08 NOTE — Progress Notes (Signed)
Pt arrived to floor from ED. Alert and oriented x1-2. On room air, O2 sats 95%+. Pt has PIV right hand and left AC. Pt bathed and had a purewick placed. Skin is intact. Patient currently watching television, given a warm blanket. IV running right hand. Pt oriented to routine and taught how to call for assistance.

## 2019-11-08 NOTE — Progress Notes (Signed)
Pt with transport to MRI

## 2019-11-09 DIAGNOSIS — D72819 Decreased white blood cell count, unspecified: Secondary | ICD-10-CM

## 2019-11-09 LAB — COMPREHENSIVE METABOLIC PANEL
ALT: 16 U/L (ref 0–44)
AST: 30 U/L (ref 15–41)
Albumin: 2.9 g/dL — ABNORMAL LOW (ref 3.5–5.0)
Alkaline Phosphatase: 36 U/L — ABNORMAL LOW (ref 38–126)
Anion gap: 10 (ref 5–15)
BUN: 18 mg/dL (ref 8–23)
CO2: 23 mmol/L (ref 22–32)
Calcium: 8.1 mg/dL — ABNORMAL LOW (ref 8.9–10.3)
Chloride: 110 mmol/L (ref 98–111)
Creatinine, Ser: 0.63 mg/dL (ref 0.44–1.00)
GFR calc Af Amer: >60 mL/min (ref >60)
GFR calc non Af Amer: >60 mL/min (ref >60)
Glucose, Bld: 108 mg/dL — ABNORMAL HIGH (ref 70–99)
Potassium: 3.4 mmol/L — ABNORMAL LOW (ref 3.5–5.1)
Sodium: 143 mmol/L (ref 135–145)
Total Bilirubin: 0.6 mg/dL (ref 0.3–1.2)
Total Protein: 5.9 g/dL — ABNORMAL LOW (ref 6.5–8.1)

## 2019-11-09 LAB — D-DIMER, QUANTITATIVE: D-Dimer, Quant: 1.53 ug/mL-FEU — ABNORMAL HIGH (ref 0.00–0.50)

## 2019-11-09 LAB — C-REACTIVE PROTEIN: CRP: 2.7 mg/dL — ABNORMAL HIGH (ref <1.0)

## 2019-11-09 LAB — CBC WITH DIFFERENTIAL/PLATELET
Abs Immature Granulocytes: 0.03 10*3/uL (ref 0.00–0.07)
Basophils Absolute: 0 10*3/uL (ref 0.0–0.1)
Basophils Relative: 0 %
Eosinophils Absolute: 0 10*3/uL (ref 0.0–0.5)
Eosinophils Relative: 0 %
HCT: 38.4 % (ref 36.0–46.0)
Hemoglobin: 12.8 g/dL (ref 12.0–15.0)
Immature Granulocytes: 0 %
Lymphocytes Relative: 24 %
Lymphs Abs: 1.6 10*3/uL (ref 0.7–4.0)
MCH: 28.8 pg (ref 26.0–34.0)
MCHC: 33.3 g/dL (ref 30.0–36.0)
MCV: 86.3 fL (ref 80.0–100.0)
Monocytes Absolute: 0.7 10*3/uL (ref 0.1–1.0)
Monocytes Relative: 10 %
Neutro Abs: 4.4 10*3/uL (ref 1.7–7.7)
Neutrophils Relative %: 66 %
Platelets: 160 10*3/uL (ref 150–400)
RBC: 4.45 MIL/uL (ref 3.87–5.11)
RDW: 12.3 % (ref 11.5–15.5)
WBC: 6.8 10*3/uL (ref 4.0–10.5)
nRBC: 0 % (ref 0.0–0.2)

## 2019-11-09 LAB — PHOSPHORUS: Phosphorus: 3.2 mg/dL (ref 2.5–4.6)

## 2019-11-09 LAB — LACTATE DEHYDROGENASE: LDH: 222 U/L — ABNORMAL HIGH (ref 98–192)

## 2019-11-09 LAB — SEDIMENTATION RATE: Sed Rate: 31 mm/hr — ABNORMAL HIGH (ref 0–22)

## 2019-11-09 LAB — MAGNESIUM: Magnesium: 1.9 mg/dL (ref 1.7–2.4)

## 2019-11-09 LAB — FIBRINOGEN: Fibrinogen: 496 mg/dL — ABNORMAL HIGH (ref 210–475)

## 2019-11-09 LAB — FERRITIN: Ferritin: 114 ng/mL (ref 11–307)

## 2019-11-09 MED ORDER — POTASSIUM CHLORIDE CRYS ER 20 MEQ PO TBCR
40.0000 meq | EXTENDED_RELEASE_TABLET | Freq: Two times a day (BID) | ORAL | Status: AC
  Administered 2019-11-09 (×2): 40 meq via ORAL
  Filled 2019-11-09 (×2): qty 2

## 2019-11-09 MED ORDER — ENSURE ENLIVE PO LIQD
237.0000 mL | Freq: Three times a day (TID) | ORAL | Status: DC
  Administered 2019-11-09 – 2019-11-12 (×10): 237 mL via ORAL

## 2019-11-09 NOTE — Evaluation (Signed)
Physical Therapy Evaluation Patient Details Name: Tammy Arnold MRN: [id number] DOB: [birth year] Today's Date: 11/09/2019   History of Present Illness  79 y.o. female with medical history significant of osteoarthritis, hyperlipidemia, hypertension, CVA who is brought to the emergency department due to generalized weakness unable to work, urinary and fecal incontinence and worsening dementia since 11/03/19. Admitted 11/08/19 for treatment of COVID and acute worsening encephalopathy on chronic dementia.   Clinical Impression  Pt with baseline memory deficits but based on her report PTA pt living with husband in single story home with a few steps to enter. Pt reports independence with limited community ambulation and bathing and dressing. Pt reports husband assists with cooking and cleaning and iADLs. Pt is currently limited in safe mobility by impaired cognition in particular unfamiliarity with DME in presence of generalized weakness and decreased balance. Pt is modA for transfers and ambulation of 6 feet with RW. Given pt's dementia she will probably due best in her home environment if husband can provide 24 hour assist. PT recommends HHPT for strengthening and balance training. PT will continue to follow acutely.     Follow Up Recommendations Home health PT;Supervision/Assistance - 24 hour    Equipment Recommendations  Wheelchair (measurements PT);Wheelchair cushion (measurements PT)    Recommendations for Other Services       Precautions / Restrictions Precautions Precautions: Fall Precaution Comments: recalls at least one fall  Restrictions Weight Bearing Restrictions: No      Mobility  Bed Mobility               General bed mobility comments: OOB in recliner  Transfers Overall transfer level: Needs assistance Equipment used: Rolling walker (2 wheeled) Transfers: Sit to/from Stand Sit to Stand: Mod assist         General transfer comment: requires 3x attempts for  steadying in standing due to increased posterior lean in upright, modA for bringing CoG over BoS  Ambulation/Gait Ambulation/Gait assistance: Mod assist Gait Distance (Feet): 6 Feet Assistive device: Rolling walker (2 wheeled) Gait Pattern/deviations: Step-through pattern;Decreased step length - right;Decreased step length - left;Shuffle Gait velocity: slowed Gait velocity interpretation: <1.31 ft/sec, indicative of household ambulator General Gait Details: modA for steadying with RW, appears unfamiliar with RW increased difficulty follow commands related to its use, ultimately stabilized posteriorly with hands on pt hips          Balance Overall balance assessment: Needs assistance Sitting-balance support: Feet supported;Feet unsupported;No upper extremity supported;Bilateral upper extremity supported Sitting balance-Leahy Scale: Poor Sitting balance - Comments: increased posterior lean with LE off floor, in attempt at LE assessment, pt with multiple LoB posteriorly into chair   Standing balance support: Bilateral upper extremity supported;During functional activity Standing balance-Leahy Scale: Poor Standing balance comment: requires UE support and outside assist to maintain balance                             Pertinent Vitals/Pain Pain Assessment: No/denies pain    Home Living Family/patient expects to be discharged to:: Private residence Living Arrangements: Spouse/significant other Available Help at Discharge: Family;Available 24 hours/day Type of Home: House Home Access: Stairs to enter Entrance Stairs-Rails: None Entrance Stairs-Number of Steps: (several) Home Layout: One level        Prior Function Level of Independence: Needs assistance   Gait / Transfers Assistance Needed: reports community distance ambulation  ADL's / Homemaking Assistance Needed: reports independence with bathing and dressing  Extremity/Trunk Assessment   Upper  Extremity Assessment Upper Extremity Assessment: Defer to OT evaluation    Lower Extremity Assessment Lower Extremity Assessment: Difficult to assess due to impaired cognition RLE Deficits / Details: ROM WFL, strength grossly assessed in mobility, 3+/5 RLE Coordination: decreased fine motor LLE Deficits / Details: ROM WFL, strength grossly assessed in mobility, 3+/5 LLE Coordination: decreased fine motor       Communication   Communication: No difficulties  Cognition Arousal/Alertness: Awake/alert Behavior During Therapy: WFL for tasks assessed/performed Overall Cognitive Status: History of cognitive impairments - at baseline                                        General Comments General comments (skin integrity, edema, etc.): SaO2 on RA >92%O2 throughout session         Assessment/Plan    PT Assessment Patient needs continued PT services  PT Problem List Decreased strength;Decreased activity tolerance;Decreased balance;Decreased mobility;Decreased coordination;Decreased cognition;Decreased knowledge of use of DME;Decreased safety awareness       PT Treatment Interventions Gait training;Stair training;Functional mobility training;Therapeutic activities;Therapeutic exercise;Balance training;Cognitive remediation;Patient/family education    PT Goals (Current goals can be found in the Care Plan section)  Acute Rehab PT Goals Patient Stated Goal: get warm PT Goal Formulation: Patient unable to participate in goal setting Time For Goal Achievement: 11/23/19 Potential to Achieve Goals: Fair    Frequency Min 3X/week    AM-PAC PT "6 Clicks" Mobility  Outcome Measure Help needed turning from your back to your side while in a flat bed without using bedrails?: A Little Help needed moving from lying on your back to sitting on the side of a flat bed without using bedrails?: A Little Help needed moving to and from a bed to a chair (including a wheelchair)?: A  Lot Help needed standing up from a chair using your arms (e.g., wheelchair or bedside chair)?: A Lot Help needed to walk in hospital room?: A Lot Help needed climbing 3-5 steps with a railing? : Total 6 Click Score: 13    End of Session Equipment Utilized During Treatment: Gait belt Activity Tolerance: Patient tolerated treatment well Patient left: in chair;with call bell/phone within reach;with chair alarm set Nurse Communication: Mobility status PT Visit Diagnosis: Unsteadiness on feet (R26.81);Other abnormalities of gait and mobility (R26.89);Ataxic gait (R26.0);Muscle weakness (generalized) (M62.81);Difficulty in walking, not elsewhere classified (R26.2)    Time: [id number] PT Time Calculation (min) (ACUTE ONLY): 26 min   Charges:   PT Evaluation $PT Eval Moderate Complexity: 1 Mod PT Treatments $Gait Training: 8-22 mins        Jawana Reagor B. Migdalia Dk PT, DPT Acute Rehabilitation Services Pager [phone] Office [phone]   [place] 11/09/2019, 3:37 PM

## 2019-11-09 NOTE — Progress Notes (Signed)
PROGRESS NOTE    Tammy Arnold  [id number] DOB: [birth year] DOA: 11/07/2019 PCP: Kelton Pillar, MD  Brief Narrative:  HPI per Dr. Tennis Must on 11/06/2018 Tammy Arnold is a 79 y.o. female with medical history significant of osteoarthritis, hyperlipidemia, hypertension, CVA who is brought to the emergency department due to generalized weakness, urinary and fecal incontinence and worsening dementia since Saturday (4 days ago).  On that day, the patient was also diagnosed with COVID-19 virus infection.  Earlier, the patient told Dr. Laverta Baltimore that she has been unable to ambulate on her own and is a lot more confused than usual.  She has not been drinking or eating much.  Last night, the patient had to sleep on the floor of the living room because she was unable to get up to get in bed.  Her husband, who happens to be a physician, call her PCP and was told to call 911.  She is currently satting in the high 90s on room air.  Her husband stated her earlier that she has not had any respiratory symptoms so far.  ED Course: Initial vital signs temperature 100 F, pulse 83, respirations 18, blood pressure 160/78 mmHg and O2 sat 99% on room air.  The patient received a 500 mL bolus in the emergency department.  Her urinalysis showed ketonuria 20 and proteinuria 100 mg/dL.  CBC showed a white count of 3.8, hemoglobin 14.6 g/dL and platelets 163.  Blood cultures x2 were drawn.  D-dimer was 1.46 and fibrinogen 586.  CRP 3.7, LDH 295.  Procalcitonin was less than 0.1 ng/mL.  Lactic acid was 2.0 and then 1.3 mmol/L.  LFTs and renal function were normal.  Potassium was 3.3 and CO2 21 mmol/L.  The rest of the electrolytes so far within normal limits when calcium is corrected to albumin.  Glucose 139 mg/dL.  Her chest radiograph was clear.  **Interim History  Her appetite was a little bit better today but she remains significantly confused and demented.  Started on remdesivir and steroids and methylprednisolone  will be stopped and dexamethasone will be initiated.  Inflammatory markers to be trended and PT OT to evaluate and treat along with a dietitian.  Inflammatory markers are starting to trend down now.  Hypokalemia is now being repleted and dietitian has now evaluated and liberalize the diet.  Assessment & Plan:   Principal Problem:   COVID-19 virus infection Active Problems:   Essential hypertension   HLD (hyperlipidemia)   Hypokalemia   Leukocytopenia  COVID-19 virus infection -Admitted as Observation but changed to Inpatient Telemetry given continued Treatment for COVID and because the patient is now spiking Temperatures -Had a TMax of 101.1 the day before yesterday but has been afebrile last 24 hours with a T-max of 98.7 -No oxygen requirement at this time and no coughing appreciated -Had only gastrointestinal symptoms and interestingly Husband reports incontinence -Given Gentle and time-limited IV hydration with NS + 40 mEQ of KCl at 50 mL/hr which is now stopped -CXR showed "No focal consolidation, pleural effusion or pneumothorax. The cardiac silhouette is within normal limits. Atherosclerotic calcification of the aorta. No acute osseous pathology." -Checked Baseline Inflammatory Markers and Trend Daily: Recent Labs    11/07/19 1606 11/08/19 0634 11/09/19 0355  DDIMER 1.47* 1.30* 1.53*  FERRITIN 101 89 114  LDH 295*  --  222*  CRP 3.7* 3.1* 2.7*  -PCT was <0.10 so no Role for Abx currently  -TG was 61 Lab Results  Component Value Date  SARSCOV2NAA Detected (A) 11/03/2019   [place] Not Detected 08/21/2019  -SpO2: 95 %; Provide Supplemental O2 via [place] if Necessary -Started Treatment with 5 Days of Remdesivir and 10 Days of Steroids; Completed 2 days of Remdesivir and she received 40 mg of Solumedrol the day before yesterday and this was changed to 6 mg of Dexamethasone and will continue for 10 days total -C/w Antitussives with Guaifenesin-Dextromethorphan 10 mL po q4hprn  Cough and Chlorpheniramine-Hydrocodone 5 mL po q12hprn -Check Blood Cx x2 and showed NGTD at <24 Hours still -Prone as much as possible if able -C/w Zinc 220 mg po Daily and Vitamin C  -Airborne and Contact Precautions -C/w Albuterol 1-2 puff IH q4hprn Wheezing and SOB -Add Flutter Valve and Incentive Spirometry  -Continue to monitor and trend inflammatory markers and repeat chest x-ray imaging in the a.m.  Essential Hypertension -Hold lisinopril for now. -Continue to Monitor blood pressures per Protocol, renal function as well electrolytes. -Last BP was 147/67 -Will likely resume BP Meds in AM   HLD (hyperlipidemia) -On Pravastatin 20 mg po qHS which has been resumed  Acute Worsening Encephalopathy on Chronic Dementia -Progressive and worsening per husband and per husband report she was unable to walk and was much more confused than normal and has not been eating or drinking well and developed both stool and urine incontinence is not typical of her and she has been sleeping on the floor of the living because she cannot get out of bed -Head CT showed "Generalized ventricular enlargement has increased from comparison exam and appears somewhat out of proportion to the degree of sulcal dilatation. Findings could reflect developing normal pressure hydrocephaly on a background of ex vacuo dilatation and parenchymal volume loss. No CT evidence of acute infarct. If persisting concern for acute infarction, consider MRI which is more sensitive and specific for early changes particularly given the background of chronic microvascular angiopathy." -MRI showed "No acute finding including infarct. Atrophy that is advanced and progressive from 2019. Chronic small vessel ischemia.  Moderately motion degraded." -C/w Donepezil 10 mg po qHS and Memantine 10 mg po BID -Delirium Precautions  -PT/OT evaluation and treat and recommending Home Health   Hypokalemia -Was 3.3 on admission and Now K+ was  3.4 -Replete with po KCl 40 mEQ BID x2 Doses -Mag Level wnL at 1.8 -Continue to Monitor and Replete as Necessary -Repeat CMP in AM   Leukopenia -Likely due to coronavirus. -Continue to Monitor WBC closely as this is improved and WBC is now 6.9 -Repeat CBC in AM  Thrombocytopenia, improved  -[name] Platelet Count went from 163 -> 144 -> 160 -Continue to Monitor for S/Sx of Bleeding -Repeat CBC in AM   Hypophosphatemia -[name] Phos Level was 2.1 and now 3.2 -Replete with po K Phos Neutral 500 mg BID x2 yesterday  -Continue to Monitor and Replete as Necessary -Repeat Phos Level in AM   Generalized Weakness in the setting of COVID-19 disease  Poor Po Intake -Obtain PT/OT Evaluations and recommending Home Health with [facility] and [place] Nutritionist Consultation and  Dietitian recommends to Liberalize Diet to Regular Diet and Adding Ensure Enlive po TID, and Magic Cup BID with Lunch and Dinner  DVT prophylaxis: Enoxaparin 40 mg sq q24h Code Status: FULL CODE  Family Communication: Discussed with Husband (Dr. Ulanda Edison) over the telephone Disposition Plan: Pending further Clinical Improvement, Evaluation by PT/OT, and Completion of Remdesivir Course and anticipate D/C Monday 11/12/2019.   Consultants:   None   Procedures:  None  Antimicrobials:  - Anti-infectives (From admission, onward)   Start     Dose/Rate Route Frequency Ordered Stop   11/09/19 1000  remdesivir 100 mg in sodium chloride 0.9 % 100 mL IVPB     100 mg 200 mL/hr over 30 Minutes Intravenous Daily 11/08/19 0112 11/13/19 0959   11/08/19 0115  remdesivir 200 mg in sodium chloride 0.9% 250 mL IVPB     200 mg 580 mL/hr over 30 Minutes Intravenous Once 11/08/19 0112 11/08/19 0717     Subjective: Seen and examined at bedside she was sitting in the chair bedside looking at the window.  She was a little bit more alert today and knew her husband's name but she did not know yesterday.   She knew her full name as well.  Unable to tell me the year or the month.  Denies any complaints.  No nausea or vomiting.  No other concerns or complaints at this time.  Objective: Vitals:   11/09/19 0800 11/09/19 0900 11/09/19 1304 11/09/19 1400  BP: 135/72  (!) 175/74 (!) 147/67  Pulse:   65   Resp:   12   Temp: 98.6 F (37 C)  98.4 F (36.9 C) 98.7 F (37.1 C)  TempSrc: Oral  Oral Oral  SpO2: 96% 98% 95%     Intake/Output Summary (Last 24 hours) at 11/09/2019 1832 Last data filed at 11/09/2019 1400 Gross per 24 hour  Intake 500 ml  Output 250 ml  Net 250 ml   There were no vitals filed for this visit.  Examination: Physical Exam:  Constitutional: Thin elderly Caucasian female who is pleasantly demented and in NAD and appears calm and comfortable sitting in the chair bedside looking at the window Eyes: Lids and conjunctivae normal, sclerae anicteric  ENMT: External Ears, Nose appear normal. Grossly normal hearing.  Neck: Appears normal, supple, no cervical masses, normal ROM, no appreciable thyromegaly; no JVD Respiratory: Diminished to auscultation bilaterally with slightly coarse breath sounds, no wheezing, rales, rhonchi or crackles. Normal respiratory effort and patient is not tachypenic. No accessory muscle use.  Unlabored breathing and she is not wearing any supplemental oxygen via nasal cannula Cardiovascular: RRR, no murmurs / rubs / gallops. S1 and S2 auscultated. No extremity edema.  Abdomen: Soft, non-tender, non-distended. Bowel sounds positive.  GU: Deferred. Musculoskeletal: No clubbing / cyanosis of digits/nails. No joint deformity upper and lower extremities. Skin: No rashes, lesions, ulcers on a limited skin evaluation. No induration; Warm and dry.  Neurologic: CN 2-12 grossly intact with no focal deficits. Romberg sign cerebellar reflexes not assessed.  Psychiatric: Impaired judgment and insight. Alert and oriented x 1. Normal mood and appropriate affect.    Data Reviewed: I have personally reviewed following labs and imaging studies  CBC: Recent Labs  Lab 11/07/19 1338 11/08/19 0634 11/09/19 0355  WBC 3.8* 5.2 6.8  NEUTROABS  --  4.1 4.4  HGB 14.6 13.7 12.8  HCT 43.6 40.3 38.4  MCV 88.4 86.3 86.3  PLT 163 144* 0000000   Basic Metabolic Panel: Recent Labs  Lab 11/07/19 1338 11/07/19 1606 11/08/19 0634 11/09/19 0355  NA 137  --  140 143  K 3.3*  --  3.5 3.4*  CL 105  --  106 110  CO2 21*  --  23 23  GLUCOSE 139*  --  114* 108*  BUN 13  --  13 18  CREATININE 0.75  --  0.75 0.63  CALCIUM 8.7*  --  8.1* 8.1*  MG  --  2.0 1.8 1.9  PHOS  --  3.7 2.1* 3.2   GFR: CrCl cannot be calculated (Unknown ideal weight.). Liver Function Tests: Recent Labs  Lab 11/07/19 1606 11/08/19 0634 11/09/19 0355  AST 36 34 30  ALT 16 19 16   ALKPHOS 47 41 36*  BILITOT 0.5 0.9 0.6  PROT 7.4 6.0* 5.9*  ALBUMIN 3.7 3.1* 2.9*   Recent Labs  Lab 11/07/19 1606  LIPASE 30   No results for input(s): AMMONIA in the last 168 hours. Coagulation Profile: No results for input(s): INR, PROTIME in the last 168 hours. Cardiac Enzymes: No results for input(s): CKTOTAL, CKMB, CKMBINDEX, TROPONINI in the last 168 hours. BNP (last 3 results) No results for input(s): PROBNP in the last 8760 hours. HbA1C: No results for input(s): HGBA1C in the last 72 hours. CBG: Recent Labs  Lab 11/07/19 1522  GLUCAP 132*   Lipid Profile: Recent Labs    11/07/19 1606  TRIG 61   Thyroid Function Tests: No results for input(s): TSH, T4TOTAL, FREET4, T3FREE, THYROIDAB in the last 72 hours. Anemia Panel: Recent Labs    11/08/19 0634 11/09/19 0355  FERRITIN 89 114   Sepsis Labs: Recent Labs  Lab 11/07/19 1606 11/07/19 1915  PROCALCITON <0.10  --   LATICACIDVEN 2.0* 1.3    Recent Results (from the past 240 hour(s))  Novel Coronavirus, NAA ([facility])     Status: Abnormal   Collection Time: 11/03/19 10:27 AM   Specimen: Nasopharyngeal(NP) swabs in  vial transport medium   NASOPHARYNGE  SCREENIN  Result Value Ref Range Status   SARS-CoV-2, NAA Detected (A) Not Detected Final    Comment: This nucleic acid amplification test was developed and its performance characteristics determined by [employer]. Nucleic acid amplification tests include PCR and TMA. This test has not been FDA cleared or approved. This test has been authorized by FDA under an Emergency Use Authorization (EUA). This test is only authorized for the duration of time the declaration that circumstances exist justifying the authorization of the emergency use of in vitro diagnostic tests for detection of SARS-CoV-2 virus and/or diagnosis of COVID-19 infection under section 564(b)(1) of the Act, 21 U.S.C. [id number]) (1), unless the authorization is terminated or revoked sooner. When diagnostic testing is negative, the possibility of a false negative result should be considered in the context of a patient's recent exposures and the presence of clinical signs and symptoms consistent with COVID-19. An individual without symptoms of COVID-19 and who is not shedding SARS-CoV-2 virus would  expect to have a negative (not detected) result in this assay.   Blood Culture (routine x 2)     Status: None (Preliminary result)   Collection Time: 11/07/19  4:10 PM   Specimen: BLOOD  Result Value Ref Range Status   Specimen Description BLOOD LEFT ANTECUBITAL  Final   Special Requests   Final    BOTTLES DRAWN AEROBIC AND ANAEROBIC Blood Culture adequate volume   Culture   Final    NO GROWTH < 24 HOURS Performed at [facility], [place] [address] [place], [place] [address]    Report Status PENDING  Incomplete  Blood Culture (routine x 2)     Status: None (Preliminary result)   Collection Time: 11/07/19  4:10 PM   Specimen: BLOOD LEFT HAND  Result Value Ref Range Status   Specimen Description BLOOD LEFT HAND  Final   Special Requests   Final    BOTTLES DRAWN AEROBIC  AND ANAEROBIC Blood Culture results  may not be optimal due to an inadequate volume of blood received in culture bottles   Culture   Final    NO GROWTH < 24 HOURS Performed at [facility] [address] [place], [place] [address]    Report Status PENDING  Incomplete    Radiology Studies: CT Head Wo Contrast  Result Date: 11/07/2019 CLINICAL DATA:  Neuro deficit, acute, stroke suspected EXAM: CT HEAD WITHOUT CONTRAST TECHNIQUE: Contiguous axial images were obtained from the base of the skull through the vertex without intravenous contrast. COMPARISON:  MRI 12/06/2017, CT head 12/04/2017, perfusion study 12/05/2017 FINDINGS: Brain: Generalized ventricular enlargement has increased from comparison exam and appears somewhat out of proportion to the degree of sulcal dilatation. No evidence of acute infarction, hemorrhage, extra-axial collection or mass lesion/mass effect. Confluent areas of white matter hypoattenuation are most compatible with chronic microvascular angiopathy. Vascular: Atherosclerotic calcification of the carotid siphons and intradural vertebral arteries. No hyperdense vessel. Skull: No calvarial fracture or suspicious osseous lesion. No scalp swelling or hematoma. Sinuses/Orbits: Mild mural thickening in the ethmoids, left maxillary, and sphenoid sinuses without air-fluid levels. Mastoid air cells are predominantly clear. Debris noted in both external auditory canals. Prior left lens extraction. No other gross orbital abnormality. Other: None IMPRESSION: 1. Generalized ventricular enlargement has increased from comparison exam and appears somewhat out of proportion to the degree of sulcal dilatation. Findings could reflect developing normal pressure hydrocephaly on a background of ex vacuo dilatation and parenchymal volume loss. 2. No CT evidence of acute infarct. If persisting concern for acute infarction, consider MRI which is more sensitive and specific for early changes  particularly given the background of chronic microvascular angiopathy. Electronically Signed   By: Lovena Le M.D.   On: 11/07/2019 21:55   MR BRAIN WO CONTRAST  Result Date: 11/08/2019 CLINICAL DATA:  Ataxia with stroke suspected EXAM: MRI HEAD WITHOUT CONTRAST TECHNIQUE: Multiplanar, multiecho pulse sequences of the brain and surrounding structures were obtained without intravenous contrast. COMPARISON:  Head CT from yesterday FINDINGS: Brain: No acute infarction, hemorrhage, obstructive hydrocephalus, extra-axial collection or mass lesion. Prominent ventriculomegaly without secondary signs of normal pressure hydrocephalus, likely advanced central predominant atrophy. Atrophy has notably progressed from 2019. Chronic small vessel ischemia in the deep periventricular white matter. No chronic blood products Vascular: Normal flow voids Skull and upper cervical spine: Negative for marrow lesion Sinuses/Orbits: Left cataract resection and moderate bilateral sinusitis. Other: Motion degraded to the degree that findings could be obscured. IMPRESSION: 1. No acute finding including infarct. 2. Atrophy that is advanced and progressive from 2019. 3. Chronic small vessel ischemia. 4. Moderately motion degraded. Electronically Signed   By: Monte Fantasia M.D.   On: 11/08/2019 06:09   Scheduled Meds:  vitamin C  500 mg Oral Daily   dexamethasone (DECADRON) injection  6 mg Intravenous Q24H   donepezil  10 mg Oral QHS   enoxaparin (LOVENOX) injection  40 mg Subcutaneous Q24H   feeding supplement (ENSURE ENLIVE)  237 mL Oral TID BM   memantine  10 mg Oral BID   potassium chloride  40 mEq Oral BID   pravastatin  20 mg Oral q1800   zinc sulfate  220 mg Oral Daily   Continuous Infusions:  remdesivir 100 mg in NS 100 mL 100 mg (11/09/19 0910)    LOS: 1 day    Kerney Elbe, DO [facility] PAGER is on AMION  If 7PM-7AM, please contact night-coverage [email or web address]

## 2019-11-09 NOTE — Progress Notes (Signed)
Initial Nutrition Assessment  RD working remotely.  DOCUMENTATION CODES:   Not applicable  INTERVENTION:   - Please obtain admission weight  - Recommend liberalizing diet to Regular  - Ensure Enlive po TID, each supplement provides 350 kcal and 20 grams of protein  - Magic Cup BID with lunch and dinner meals, each supplement provides 290 kcal and 9 grams of protein  - Encourage adequate PO intake and provide feeding assistance as needed  NUTRITION DIAGNOSIS:   Inadequate oral intake related to lethargy/confusion, decreased appetite as evidenced by meal completion < 50%.  GOAL:   Patient will meet greater than or equal to 90% of their needs  MONITOR:   PO intake, Supplement acceptance, Labs, Weight trends  REASON FOR ASSESSMENT:   Consult Assessment of nutrition requirement/status, Poor PO  ASSESSMENT:   79 year old female who presented to the ED on 1/13 with AMS. Pt was diagnosed with COVID-19 as an outpatient on 11/03/19. PMH of HLD, HTN, prior CVA, dementia.   Pt currently on a Heart Healthy diet. RD messaged MD via secure chat regarding diet liberalization. Awaiting response.  Spoke with pt briefly via phone call to room. Pt unable to provide much history secondary to dementia. Pt states that her appetite is "good." Pt reports that she ate something this morning but is unable to recall what it was that she ate.  Pt reports that she does like Ensure shakes in all flavors. RD to order TID between meals.  Reviewed weight history in chart. No weights available since 07/17/19. Please obtain admission weight.  Meal Completion: 10-30% x 3 meals  Medications reviewed and include: vitamin C, decadron, Klor-con 40 mEq BID, zinc sulfate, remdesivir  Labs reviewed: potassium 3.4 CBG's: 132  UOP: 750 ml x 24 hours  NUTRITION - FOCUSED PHYSICAL EXAM:  Unable to complete at this time. RD working remotely.  Diet Order:   Diet Order            Diet Heart Room service  appropriate? Yes; Fluid consistency: Thin  Diet effective now              EDUCATION NEEDS:   Not appropriate for education at this time  Skin:  Skin Assessment: Reviewed RN Assessment  Last BM:  11/08/19 medium type 4  Height:   Ht Readings from Last 1 Encounters:  05/11/18 5\' 4"  (1.626 m)    Weight:   Wt Readings from Last 1 Encounters:  07/17/19 62.8 kg    Ideal Body Weight:  54.5 kg  BMI:  23.79 kg/m^2 (calculated using height from 05/11/18 and weight from 07/17/19)  Estimated Nutritional Needs:   Kcal:  1400-1600  Protein:  70-85 grams  Fluid:  1.4-1.6 L    Gaynell Face, MS, RD, LDN Inpatient Clinical Dietitian Pager: [phone] Weekend/After Hours: [phone]

## 2019-11-10 LAB — CBC WITH DIFFERENTIAL/PLATELET
Abs Immature Granulocytes: 0.05 10*3/uL (ref 0.00–0.07)
Basophils Absolute: 0 10*3/uL (ref 0.0–0.1)
Basophils Relative: 0 %
Eosinophils Absolute: 0 10*3/uL (ref 0.0–0.5)
Eosinophils Relative: 0 %
HCT: 39 % (ref 36.0–46.0)
Hemoglobin: 13.3 g/dL (ref 12.0–15.0)
Immature Granulocytes: 1 %
Lymphocytes Relative: 25 %
Lymphs Abs: 1.5 10*3/uL (ref 0.7–4.0)
MCH: 29.4 pg (ref 26.0–34.0)
MCHC: 34.1 g/dL (ref 30.0–36.0)
MCV: 86.3 fL (ref 80.0–100.0)
Monocytes Absolute: 0.6 10*3/uL (ref 0.1–1.0)
Monocytes Relative: 9 %
Neutro Abs: 3.9 10*3/uL (ref 1.7–7.7)
Neutrophils Relative %: 65 %
Platelets: 186 10*3/uL (ref 150–400)
RBC: 4.52 MIL/uL (ref 3.87–5.11)
RDW: 12.3 % (ref 11.5–15.5)
WBC: 6.1 10*3/uL (ref 4.0–10.5)
nRBC: 0 % (ref 0.0–0.2)

## 2019-11-10 LAB — COMPREHENSIVE METABOLIC PANEL
ALT: 18 U/L (ref 0–44)
AST: 29 U/L (ref 15–41)
Albumin: 2.8 g/dL — ABNORMAL LOW (ref 3.5–5.0)
Alkaline Phosphatase: 37 U/L — ABNORMAL LOW (ref 38–126)
Anion gap: 7 (ref 5–15)
BUN: 17 mg/dL (ref 8–23)
CO2: 26 mmol/L (ref 22–32)
Calcium: 8.4 mg/dL — ABNORMAL LOW (ref 8.9–10.3)
Chloride: 108 mmol/L (ref 98–111)
Creatinine, Ser: 0.65 mg/dL (ref 0.44–1.00)
GFR calc Af Amer: >60 mL/min (ref >60)
GFR calc non Af Amer: >60 mL/min (ref >60)
Glucose, Bld: 99 mg/dL (ref 70–99)
Potassium: 4.7 mmol/L (ref 3.5–5.1)
Sodium: 141 mmol/L (ref 135–145)
Total Bilirubin: 0.5 mg/dL (ref 0.3–1.2)
Total Protein: 5.6 g/dL — ABNORMAL LOW (ref 6.5–8.1)

## 2019-11-10 LAB — LACTATE DEHYDROGENASE: LDH: 243 U/L — ABNORMAL HIGH (ref 98–192)

## 2019-11-10 LAB — D-DIMER, QUANTITATIVE: D-Dimer, Quant: 0.59 ug/mL-FEU — ABNORMAL HIGH (ref 0.00–0.50)

## 2019-11-10 LAB — FERRITIN: Ferritin: 93 ng/mL (ref 11–307)

## 2019-11-10 LAB — FIBRINOGEN: Fibrinogen: 470 mg/dL (ref 210–475)

## 2019-11-10 LAB — C-REACTIVE PROTEIN: CRP: 1.1 mg/dL — ABNORMAL HIGH (ref <1.0)

## 2019-11-10 LAB — MAGNESIUM: Magnesium: 2 mg/dL (ref 1.7–2.4)

## 2019-11-10 LAB — SEDIMENTATION RATE: Sed Rate: 28 mm/hr — ABNORMAL HIGH (ref 0–22)

## 2019-11-10 LAB — PHOSPHORUS: Phosphorus: 2.7 mg/dL (ref 2.5–4.6)

## 2019-11-10 NOTE — Progress Notes (Signed)
PROGRESS NOTE    Tammy Arnold  [id number] DOB: 1941/08/17 DOA: 11/07/2019 PCP: Kelton Pillar, MD  Brief Narrative:  HPI per Dr. Tennis Must on 11/06/2018 Tammy Arnold is a 79 y.o. female with medical history significant of osteoarthritis, hyperlipidemia, hypertension, CVA who is brought to the emergency department due to generalized weakness, urinary and fecal incontinence and worsening dementia since Saturday (4 days ago).  On that day, the patient was also diagnosed with COVID-19 virus infection.  Earlier, the patient told Dr. Laverta Baltimore that she has been unable to ambulate on her own and is a lot more confused than usual.  She has not been drinking or eating much.  Last night, the patient had to sleep on the floor of the living room because she was unable to get up to get in bed.  Her husband, who happens to be a physician, call her PCP and was told to call 911.  She is currently satting in the high 90s on room air.  Her husband stated her earlier that she has not had any respiratory symptoms so far.  ED Course: Initial vital signs temperature 100 F, pulse 83, respirations 18, blood pressure 160/78 mmHg and O2 sat 99% on room air.  The patient received a 500 mL bolus in the emergency department.  Her urinalysis showed ketonuria 20 and proteinuria 100 mg/dL.  CBC showed a white count of 3.8, hemoglobin 14.6 g/dL and platelets 163.  Blood cultures x2 were drawn.  D-dimer was 1.46 and fibrinogen 586.  CRP 3.7, LDH 295.  Procalcitonin was less than 0.1 ng/mL.  Lactic acid was 2.0 and then 1.3 mmol/L.  LFTs and renal function were normal.  Potassium was 3.3 and CO2 21 mmol/L.  The rest of the electrolytes so far within normal limits when calcium is corrected to albumin.  Glucose 139 mg/dL.  Her chest radiograph was clear.  **Interim History  Her appetite was a little bit better today but she remains significantly confused and demented.  Started on remdesivir and steroids and methylprednisolone  will be stopped and dexamethasone will be initiated.  Inflammatory markers to be trended and PT OT to evaluate and treat along with a dietitian.  Inflammatory markers are starting to trend down now.  Hypokalemia is now being repleted and dietitian has now evaluated and liberalize the diet.  She is stable and improving and anticipating discharge home with home physical therapy Monday after completion of remdesivir.  Assessment & Plan:   Principal Problem:   COVID-19 virus infection Active Problems:   Essential hypertension   HLD (hyperlipidemia)   Hypokalemia   Leukocytopenia  COVID-19 virus infection -Admitted as Observation but changed to Inpatient Telemetry given continued Treatment for COVID and because the patient is now spiking Temperatures -Had a TMax of 101.1 the day before yesterday but has been afebrile last 24 hours with a T-max of 98.7 -No oxygen requirement at this time and no coughing appreciated -Had only gastrointestinal symptoms and interestingly Husband reports incontinence -Given Gentle and time-limited IV hydration with NS + 40 mEQ of KCl at 50 mL/hr which is now stopped -CXR showed "No focal consolidation, pleural effusion or pneumothorax. The cardiac silhouette is within normal limits. Atherosclerotic calcification of the aorta. No acute osseous pathology." -Checked Baseline Inflammatory Markers and Trend Daily: Recent Labs    11/08/19 0634 11/09/19 0355 11/10/19 0738  DDIMER 1.30* 1.53* 0.59*  FERRITIN 89 114 93  LDH  --  222* 243*  CRP 3.1* 2.7* 1.1*  -  PCT was <0.10 so no Role for Abx currently  -Fibrinogen went from 586 -> 496 -> 470 -TG was 61 Lab Results  Component Value Date   SARSCOV2NAA Detected (A) 11/03/2019   [place] Not Detected 08/21/2019  -SpO2: 98 %; Provide Supplemental O2 via [place] if Necessary -Started Treatment with 5 Days of Remdesivir and 10 Days of Steroids; Completed 3 days of Remdesivir and she received 40 mg of Solumedrol the day  before yesterday and this was changed to 6 mg of Dexamethasone and will continue for 10 days total -C/w Antitussives with Guaifenesin-Dextromethorphan 10 mL po q4hprn Cough and Chlorpheniramine-Hydrocodone 5 mL po q12hprn -Check Blood Cx x2 and showed NGTD at 3 Days -Prone as much as possible if able -C/w Zinc 220 mg po Daily and Vitamin C  -Airborne and Contact Precautions -C/w Albuterol 1-2 puff IH q4hprn Wheezing and SOB -Add Flutter Valve and Incentive Spirometry  -Continue to monitor and trend inflammatory markers and repeat chest x-ray imaging in the a.m.  Essential Hypertension -Hold lisinopril for now. -Continue to Monitor blood pressures per Protocol, renal function as well electrolytes. -Last BP was 139/71 -Will likely resume BP Meds in AM   HLD (hyperlipidemia) -On Pravastatin 20 mg po qHS which has been resumed  Acute Worsening Encephalopathy on Chronic Dementia -Progressive and worsening per husband and per husband report she was unable to walk and was much more confused than normal and has not been eating or drinking well and developed both stool and urine incontinence is not typical of her and she has been sleeping on the floor of the living because she cannot get out of bed -Head CT showed "Generalized ventricular enlargement has increased from comparison exam and appears somewhat out of proportion to the degree of sulcal dilatation. Findings could reflect developing normal pressure hydrocephaly on a background of ex vacuo dilatation and parenchymal volume loss. No CT evidence of acute infarct. If persisting concern for acute infarction, consider MRI which is more sensitive and specific for early changes particularly given the background of chronic microvascular angiopathy." -MRI showed "No acute finding including infarct. Atrophy that is advanced and progressive from 2019. Chronic small vessel ischemia.  Moderately motion degraded." -C/w Donepezil 10 mg po qHS and Memantine 10  mg po BID -Delirium Precautions  -PT/OT evaluation and treat and recommending Home Health  -Still continues to be Pleasantly Demented   Hypokalemia -Was 3.3 on admission and Now K+ was 4.7 after repletion -Mag Level wnL at 2.0 -Continue to Monitor and Replete as Necessary -Repeat CMP in AM   Leukopenia -Likely due to coronavirus. -Continue to Monitor WBC closely as this is improved and WBC is now 6.1 -Repeat CBC in AM  Thrombocytopenia, improved  -[name] Platelet Count went from 163 -> 144 -> 160 -> 186 -Continue to Monitor for S/Sx of Bleeding -Repeat CBC in AM   Hypophosphatemia -[name] Phos Level is now 2.7 -Continue to Monitor and Replete as Necessary -Repeat Phos Level in AM   Generalized Weakness in the setting of COVID-19 disease  Poor Po Intake -Obtain PT/OT Evaluations and recommending Home Health with [facility] [place] Nutritionist Consultation and  Dietitian recommends to Liberalize Diet to Regular Diet and Adding Ensure Enlive po TID, and Magic Cup BID with Lunch and Dinner -Have PT/OT to re-evaluate and treat  DVT prophylaxis: Enoxaparin 40 mg sq q24h Code Status: FULL CODE  Family Communication: Discussed with Husband (Dr. Ulanda Edison) over the telephone Disposition Plan: Pending further Clinical Improvement, Evaluation  by PT/OT, and Completion of Remdesivir Course and anticipate D/C Monday 11/12/2019.   Consultants:   None   Procedures:  None  Antimicrobials:  - Anti-infectives (From admission, onward)   Start     Dose/Rate Route Frequency Ordered Stop   11/09/19 1000  remdesivir 100 mg in sodium chloride 0.9 % 100 mL IVPB     100 mg 200 mL/hr over 30 Minutes Intravenous Daily 11/08/19 0112 11/13/19 0959   11/08/19 0115  remdesivir 200 mg in sodium chloride 0.9% 250 mL IVPB     200 mg 580 mL/hr over 30 Minutes Intravenous Once 11/08/19 0112 11/08/19 0717     Subjective: Seen and examined at bedside she was again  sitting in the chair at bedside.  She remains pleasantly demented and still unable to tell me where she is, the month or the year and cannot member her husband's name today but she does note her name.  Nursing reports that she is eating little bit better today than she did yesterday.  Still having some incontinent stools as well as urine did not have any incontinent stools today.  No nausea or vomiting.  No other concerns or complaints at this time.  Objective: Vitals:   11/09/19 1400 11/09/19 2042 11/10/19 0610 11/10/19 1549  BP: (!) 147/67 (!) 161/77 (!) 153/70 139/71  Pulse:  61 92 74  Resp:  14 14 14   Temp: 98.7 F (37.1 C) 98.5 F (36.9 C) 98.3 F (36.8 C) 97.6 F (36.4 C)  TempSrc: Oral Oral Axillary Oral  SpO2:  98% 95% 98%    Intake/Output Summary (Last 24 hours) at 11/10/2019 1629 Last data filed at 11/10/2019 1400 Gross per 24 hour  Intake 844 ml  Output 1000 ml  Net -156 ml   There were no vitals filed for this visit.  Examination: Physical Exam:  Constitutional: Thin elderly Caucasian female who is pleasantly demented and is in no acute distress and appears calm and comfortable sitting in chair bedside Eyes: Lids and conjunctivae normal, sclerae anicteric  ENMT: External Ears, Nose appear normal. Grossly normal hearing. Mucous membranes are moist.  Neck: Appears normal, supple, no cervical masses, normal ROM, no appreciable thyromegaly; no JVD Respiratory: Diminished to auscultation bilaterally, no wheezing, rales, rhonchi or crackles. Normal respiratory effort and patient is not tachypenic. No accessory muscle use. Unlabored breathing and is not wearing supplemental O2 via [place] Cardiovascular: RRR, no murmurs / rubs / gallops. S1 and S2 auscultated. No extremity edema.  Abdomen: Soft, non-tender, non-distended. Bowel sounds positive.  GU: Deferred. Musculoskeletal: No clubbing / cyanosis of digits/nails. Normal strength and muscle tone.  Skin: No rashes, lesions, ulcers  on a limited skin evaluation. No induration; Warm and dry.  Neurologic: CN 2-12 grossly intact with no focal deficits. Romberg sign and cerebellar reflexes not assessed.  Psychiatric: Impaired judgment and insight. Alert and oriented x 1. Normal mood and appropriate affect.   Data Reviewed: I have personally reviewed following labs and imaging studies  CBC: Recent Labs  Lab 11/07/19 1338 11/08/19 0634 11/09/19 0355 11/10/19 0738  WBC 3.8* 5.2 6.8 6.1  NEUTROABS  --  4.1 4.4 3.9  HGB 14.6 13.7 12.8 13.3  HCT 43.6 40.3 38.4 39.0  MCV 88.4 86.3 86.3 86.3  PLT 163 144* 160 [id number]   Basic Metabolic Panel: Recent Labs  Lab 11/07/19 1338 11/07/19 1606 11/08/19 0634 11/09/19 0355 11/10/19 0738  NA 137  --  140 143 141  K 3.3*  --  3.5 3.4*  4.7  CL 105  --  106 110 108  CO2 21*  --  23 23 26   GLUCOSE 139*  --  114* 108* 99  BUN 13  --  13 18 17   CREATININE 0.75  --  0.75 0.63 0.65  CALCIUM 8.7*  --  8.1* 8.1* 8.4*  MG  --  2.0 1.8 1.9 2.0  PHOS  --  3.7 2.1* 3.2 2.7   GFR: CrCl cannot be calculated (Unknown ideal weight.). Liver Function Tests: Recent Labs  Lab 11/07/19 1606 11/08/19 0634 11/09/19 0355 11/10/19 0738  AST 36 34 30 29  ALT 16 19 16 18   ALKPHOS 47 41 36* 37*  BILITOT 0.5 0.9 0.6 0.5  PROT 7.4 6.0* 5.9* 5.6*  ALBUMIN 3.7 3.1* 2.9* 2.8*   Recent Labs  Lab 11/07/19 1606  LIPASE 30   No results for input(s): AMMONIA in the last 168 hours. Coagulation Profile: No results for input(s): INR, PROTIME in the last 168 hours. Cardiac Enzymes: No results for input(s): CKTOTAL, CKMB, CKMBINDEX, TROPONINI in the last 168 hours. BNP (last 3 results) No results for input(s): PROBNP in the last 8760 hours. HbA1C: No results for input(s): HGBA1C in the last 72 hours. CBG: Recent Labs  Lab 11/07/19 1522  GLUCAP 132*   Lipid Profile: No results for input(s): CHOL, HDL, LDLCALC, TRIG, CHOLHDL, LDLDIRECT in the last 72 hours. Thyroid Function Tests: No  results for input(s): TSH, T4TOTAL, FREET4, T3FREE, THYROIDAB in the last 72 hours. Anemia Panel: Recent Labs    11/09/19 0355 11/10/19 0738  FERRITIN 114 93   Sepsis Labs: Recent Labs  Lab 11/07/19 1606 11/07/19 1915  PROCALCITON <0.10  --   LATICACIDVEN 2.0* 1.3    Recent Results (from the past 240 hour(s))  Novel Coronavirus, NAA ([facility])     Status: Abnormal   Collection Time: 11/03/19 10:27 AM   Specimen: Nasopharyngeal(NP) swabs in vial transport medium   NASOPHARYNGE  SCREENIN  Result Value Ref Range Status   SARS-CoV-2, NAA Detected (A) Not Detected Final    Comment: This nucleic acid amplification test was developed and its performance characteristics determined by [employer]. Nucleic acid amplification tests include PCR and TMA. This test has not been FDA cleared or approved. This test has been authorized by FDA under an Emergency Use Authorization (EUA). This test is only authorized for the duration of time the declaration that circumstances exist justifying the authorization of the emergency use of in vitro diagnostic tests for detection of SARS-CoV-2 virus and/or diagnosis of COVID-19 infection under section 564(b)(1) of the Act, 21 U.S.C. PT:2852782) (1), unless the authorization is terminated or revoked sooner. When diagnostic testing is negative, the possibility of a false negative result should be considered in the context of a patient's recent exposures and the presence of clinical signs and symptoms consistent with COVID-19. An individual without symptoms of COVID-19 and who is not shedding SARS-CoV-2 virus would  expect to have a negative (not detected) result in this assay.   Blood Culture (routine x 2)     Status: None (Preliminary result)   Collection Time: 11/07/19  4:10 PM   Specimen: BLOOD  Result Value Ref Range Status   Specimen Description BLOOD LEFT ANTECUBITAL  Final   Special Requests   Final    BOTTLES DRAWN AEROBIC AND  ANAEROBIC Blood Culture adequate volume   Culture   Final    NO GROWTH 3 DAYS Performed at [facility], [address]  [place], [place] [address]    Report Status PENDING  Incomplete  Blood Culture (routine x 2)     Status: None (Preliminary result)   Collection Time: 11/07/19  4:10 PM   Specimen: BLOOD LEFT HAND  Result Value Ref Range Status   Specimen Description BLOOD LEFT HAND  Final   Special Requests   Final    BOTTLES DRAWN AEROBIC AND ANAEROBIC Blood Culture results may not be optimal due to an inadequate volume of blood received in culture bottles   Culture   Final    NO GROWTH 3 DAYS Performed at [facility], [place] [address] [place], [place] [address]    Report Status PENDING  Incomplete    Radiology Studies: No results found. Scheduled Meds: . vitamin C  500 mg Oral Daily  . dexamethasone (DECADRON) injection  6 mg Intravenous Q24H  . donepezil  10 mg Oral QHS  . enoxaparin (LOVENOX) injection  40 mg Subcutaneous Q24H  . feeding supplement (ENSURE ENLIVE)  237 mL Oral TID BM  . memantine  10 mg Oral BID  . pravastatin  20 mg Oral q1800  . zinc sulfate  220 mg Oral Daily   Continuous Infusions: . remdesivir 100 mg in NS 100 mL 100 mg (11/10/19 0944)    LOS: 2 days    Kerney Elbe, DO [facility] PAGER is on AMION  If 7PM-7AM, please contact night-coverage [email or web address]

## 2019-11-11 LAB — CBC WITH DIFFERENTIAL/PLATELET
Abs Immature Granulocytes: 0.11 10*3/uL — ABNORMAL HIGH (ref 0.00–0.07)
Basophils Absolute: 0 10*3/uL (ref 0.0–0.1)
Basophils Relative: 0 %
Eosinophils Absolute: 0 10*3/uL (ref 0.0–0.5)
Eosinophils Relative: 0 %
HCT: 41.3 % (ref 36.0–46.0)
Hemoglobin: 14.3 g/dL (ref 12.0–15.0)
Immature Granulocytes: 1 %
Lymphocytes Relative: 21 %
Lymphs Abs: 1.9 10*3/uL (ref 0.7–4.0)
MCH: 29.4 pg (ref 26.0–34.0)
MCHC: 34.6 g/dL (ref 30.0–36.0)
MCV: 85 fL (ref 80.0–100.0)
Monocytes Absolute: 0.7 10*3/uL (ref 0.1–1.0)
Monocytes Relative: 8 %
Neutro Abs: 6.1 10*3/uL (ref 1.7–7.7)
Neutrophils Relative %: 70 %
Platelets: 202 10*3/uL (ref 150–400)
RBC: 4.86 MIL/uL (ref 3.87–5.11)
RDW: 12.1 % (ref 11.5–15.5)
WBC: 8.7 10*3/uL (ref 4.0–10.5)
nRBC: 0 % (ref 0.0–0.2)

## 2019-11-11 LAB — COMPREHENSIVE METABOLIC PANEL
ALT: 22 U/L (ref 0–44)
AST: 27 U/L (ref 15–41)
Albumin: 3 g/dL — ABNORMAL LOW (ref 3.5–5.0)
Alkaline Phosphatase: 37 U/L — ABNORMAL LOW (ref 38–126)
Anion gap: 11 (ref 5–15)
BUN: 21 mg/dL (ref 8–23)
CO2: 21 mmol/L — ABNORMAL LOW (ref 22–32)
Calcium: 8.4 mg/dL — ABNORMAL LOW (ref 8.9–10.3)
Chloride: 107 mmol/L (ref 98–111)
Creatinine, Ser: 0.6 mg/dL (ref 0.44–1.00)
GFR calc Af Amer: >60 mL/min (ref >60)
GFR calc non Af Amer: >60 mL/min (ref >60)
Glucose, Bld: 94 mg/dL (ref 70–99)
Potassium: 3.7 mmol/L (ref 3.5–5.1)
Sodium: 139 mmol/L (ref 135–145)
Total Bilirubin: 0.5 mg/dL (ref 0.3–1.2)
Total Protein: 6 g/dL — ABNORMAL LOW (ref 6.5–8.1)

## 2019-11-11 LAB — PHOSPHORUS: Phosphorus: 3.2 mg/dL (ref 2.5–4.6)

## 2019-11-11 LAB — MAGNESIUM: Magnesium: 1.9 mg/dL (ref 1.7–2.4)

## 2019-11-11 LAB — LACTATE DEHYDROGENASE: LDH: 237 U/L — ABNORMAL HIGH (ref 98–192)

## 2019-11-11 LAB — SEDIMENTATION RATE: Sed Rate: 22 mm/hr (ref 0–22)

## 2019-11-11 LAB — D-DIMER, QUANTITATIVE: D-Dimer, Quant: 0.66 ug/mL-FEU — ABNORMAL HIGH (ref 0.00–0.50)

## 2019-11-11 LAB — C-REACTIVE PROTEIN: CRP: 0.8 mg/dL (ref <1.0)

## 2019-11-11 LAB — FERRITIN: Ferritin: 86 ng/mL (ref 11–307)

## 2019-11-11 LAB — FIBRINOGEN: Fibrinogen: 482 mg/dL — ABNORMAL HIGH (ref 210–475)

## 2019-11-11 MED ORDER — HYDRALAZINE HCL 20 MG/ML IJ SOLN
5.0000 mg | Freq: Once | INTRAMUSCULAR | Status: AC
  Administered 2019-11-11: 5 mg via INTRAVENOUS
  Filled 2019-11-11: qty 1

## 2019-11-11 NOTE — Progress Notes (Signed)
[facility], [place] NP was notified of the patient's BP 195/72 and HR in 26s.  HR occasionally drops into the 30s then it returns to the 40s.. Pt is currently asymptomatic. NO PRN BP medications ordered.

## 2019-11-11 NOTE — [place] Notes (Signed)
PROGRESS NOTE    [name]  [id number] DOB: 1941-08-11 DOA: 11/07/2019 PCP: Kelton Pillar, MD  Brief Narrative:  HPI per Dr. Tennis Must on 11/06/2018 Tammy Arnold is a 79 y.o. female with medical history significant of osteoarthritis, hyperlipidemia, hypertension, CVA who is brought to the emergency department due to generalized weakness, urinary and fecal incontinence and worsening dementia since Saturday (4 days ago).  On that day, the patient was also diagnosed with COVID-19 virus infection.  Earlier, the patient told Dr. Laverta Baltimore that she has been unable to ambulate on her own and is a lot more confused than usual.  She has not been drinking or eating much.  Last night, the patient had to sleep on the floor of the living room because she was unable to get up to get in bed.  Her husband, who happens to be a physician, call her PCP and was told to call 911.  She is currently satting in the high 90s on room air.  Her husband stated her earlier that she has not had any respiratory symptoms so far.  ED Course: Initial vital signs temperature 100 F, pulse 83, respirations 18, blood pressure 160/78 mmHg and O2 sat 99% on room air.  The patient received a 500 mL bolus in the emergency department.  Her urinalysis showed ketonuria 20 and proteinuria 100 mg/dL.  CBC showed a white count of 3.8, hemoglobin 14.6 g/dL and platelets 163.  Blood cultures x2 were drawn.  D-dimer was 1.46 and fibrinogen 586.  CRP 3.7, LDH 295.  Procalcitonin was less than 0.1 ng/mL.  Lactic acid was 2.0 and then 1.3 mmol/L.  LFTs and renal function were normal.  Potassium was 3.3 and CO2 21 mmol/L.  The rest of the electrolytes so far within normal limits when calcium is corrected to albumin.  Glucose 139 mg/dL.  Her chest radiograph was clear.  **Interim History  Her appetite is improving but she remains significantly confused and demented.  Started on remdesivir and steroids and methylprednisolone will be stopped  and dexamethasone will be initiated.  Inflammatory markers to be trended and PT OT to evaluate and treat along with a dietitian.  Inflammatory markers are starting to trend down now.  Hypokalemia is now being repleted and dietitian has now evaluated and liberalize the diet.  She is stable and improving and anticipating discharge home with home physical therapy Monday after completion of remdesivir.   Overnight became bradycardic and Hypertensive. Held her Donepezil due to bradycardia.   Assessment & Plan:   Principal Problem:   COVID-19 virus infection Active Problems:   Essential hypertension   HLD (hyperlipidemia)   Hypokalemia   Leukocytopenia  COVID-19 Virus Infection -Admitted as Observation but changed to Inpatient Telemetry given continued Treatment for COVID and because the patient is now spiking Temperatures -Had a TMax of 101.1 the day before yesterday but has been afebrile last 24 hours with a T-max of 98.7 -No oxygen requirement at this time and no coughing appreciated -Had only gastrointestinal symptoms and interestingly Husband reports incontinence -Given Gentle and time-limited IV hydration with NS + 40 mEQ of KCl at 50 mL/hr which is now stopped -CXR showed "No focal consolidation, pleural effusion or pneumothorax. The cardiac silhouette is within normal limits. Atherosclerotic calcification of the aorta. No acute osseous pathology." -Checked Baseline Inflammatory Markers and Trend Daily: Recent Labs    11/09/19 0355 11/10/19 0738 11/11/19 0500  DDIMER 1.53* 0.59* 0.66*  FERRITIN 114 93 86  LDH 222*  243* 237*  CRP 2.7* 1.1* 0.8  -PCT was <0.10 so no Role for Abx currently  -Fibrinogen went from 586 -> 496 -> 470 -> 482 -TG was 61 Lab Results  Component Value Date   SARSCOV2NAA Detected (A) 11/03/2019   [place] Not Detected 08/21/2019  -SpO2: 97 %; Provide Supplemental O2 via [place] if Necessary -Started Treatment with 5 Days of Remdesivir and 10 Days of  Steroids; Completed 4 days of Remdesivir and she received 40 mg of Solumedrol the day before yesterday and this was changed to 6 mg of Dexamethasone and will continue for 10 days total -C/w Antitussives with Guaifenesin-Dextromethorphan 10 mL po q4hprn Cough and Chlorpheniramine-Hydrocodone 5 mL po q12hprn -Check Blood Cx x2 and showed NGTD at 4 Days -Prone as much as possible if able -C/w Zinc 220 mg po Daily and Vitamin C  -Airborne and Contact Precautions -C/w Albuterol 1-2 puff IH q4hprn Wheezing and SOB -Add Flutter Valve and Incentive Spirometry  -Continue to monitor and trend inflammatory markers and repeat chest x-ray imaging in the a.m.  Essential Hypertension -Hold lisinopril for now. -Continue to Monitor blood pressures per Protocol, renal function as well electrolytes. -Last BP was 132/94 -Will likely resume BP Meds in AM with Lisinopril   HLD (hyperlipidemia) -On Pravastatin 20 mg po qHS which has been resumed  Acute Worsening Encephalopathy on Chronic Dementia -Progressive and worsening per husband and per husband report she was unable to walk and was much more confused than normal and has not been eating or drinking well and developed both stool and urine incontinence is not typical of her and she has been sleeping on the floor of the living because she cannot get out of bed -Head CT showed "Generalized ventricular enlargement has increased from comparison exam and appears somewhat out of proportion to the degree of sulcal dilatation. Findings could reflect developing normal pressure hydrocephaly on a background of ex vacuo dilatation and parenchymal volume loss. No CT evidence of acute infarct. If persisting concern for acute infarction, consider MRI which is more sensitive and specific for early changes particularly given the background of chronic microvascular angiopathy." -MRI showed "No acute finding including infarct. Atrophy that is advanced and progressive from 2019.  Chronic small vessel ischemia.  Moderately motion degraded." -Hold Donepezil 10 mg po qHS ; C/w Memantine 10 mg po BID -Delirium Precautions  -PT/OT evaluation and treat and recommending Home Health  -Have asked Nursing to Ambulate with the Patient  -Still continues to be Pleasantly Demented   Hypokalemia -Was 3.3 on admission and Now K+ is 3.7 -Mag Level wnL at 2.0 -Continue to Monitor and Replete as Necessary -Repeat CMP in AM   Leukopenia, improved  -Likely due to coronavirus. -Continue to Monitor WBC closely as this is improved and WBC is now 9.7 -Repeat CBC in AM  Thrombocytopenia, improved  -[name] Platelet Count went from 163 -> 144 -> 160 -> 186 -> 202 -Continue to Monitor for S/Sx of Bleeding -Repeat CBC in AM   Hypophosphatemia -[name] Phos Level is now 3.2 -Continue to Monitor and Replete as Necessary -Repeat Phos Level in AM   Generalized Weakness in the setting of COVID-19 disease  Poor Po Intake -Obtain PT/OT Evaluations and recommending Home Health with [facility] and [place] Nutritionist Consultation and  Dietitian recommends to Liberalize Diet to Regular Diet and Adding Ensure Enlive po TID, and Magic Cup BID with Lunch and Dinner -Have PT/OT to re-evaluate and treat; Order placed and will place  as Imminent discharge to re-evaluate  Bradycardia -Became bradycardic last night -Continue to Monitor on Telemetry -Hold Donepezil for now  DVT prophylaxis: Enoxaparin 40 mg sq q24h Code Status: FULL CODE  Family Communication: Discussed with Husband (Dr. Ulanda Edison) over the telephone Disposition Plan: Pending further Clinical Improvement, Evaluation by PT/OT, and Completion of Remdesivir Course and anticipate D/C Monday 11/12/2019.   Consultants:   None   Procedures:  None  Antimicrobials:  - Anti-infectives (From admission, onward)   Start     Dose/Rate Route Frequency Ordered Stop   11/09/19 1000  remdesivir 100 mg in  sodium chloride 0.9 % 100 mL IVPB     100 mg 200 mL/hr over 30 Minutes Intravenous Daily 11/08/19 0112 11/13/19 0959   11/08/19 0115  remdesivir 200 mg in sodium chloride 0.9% 250 mL IVPB     200 mg 580 mL/hr over 30 Minutes Intravenous Once 11/08/19 0112 11/08/19 0717     Subjective: Seen and examined at bedside she is sitting in the chair at bedside looking at the window and attempting to answer telephone but she was hitting the TV remote.  No nausea or vomiting.  States that she is doing okay and wanting to go home.  No other concerns or complaints at this time.  Objective: Vitals:   11/11/19 0443 11/11/19 0500 11/11/19 0641 11/11/19 0944  BP: (!) 167/83 (!) 158/95 (!) 126/95 (!) 132/94  Pulse:  61 74   Resp:  14 16   Temp:   98.2 F (36.8 C)   TempSrc:   Oral   SpO2:  98% 97%     Intake/Output Summary (Last 24 hours) at 11/11/2019 1751 Last data filed at 11/11/2019 0929 Gross per 24 hour  Intake 477 ml  Output 1000 ml  Net -523 ml   There were no vitals filed for this visit.  Examination: Physical Exam:  Constitutional: Thin elderly Caucasian female who is pleasantly demented in no acute distress Eyes: Lids and conjunctivae normal, sclerae anicteric  ENMT: External Ears, Nose appear normal. Grossly normal hearing. Mucous membranes are moist.  Neck: Appears normal, supple, no cervical masses, normal ROM, no appreciable thyromegaly; no JVD Respiratory: Diminished to auscultation bilaterally, no wheezing, rales, rhonchi or crackles. Normal respiratory effort and patient is not tachypenic. No accessory muscle use.  Has unlabored breathing Cardiovascular: RRR, no murmurs / rubs / gallops. S1 and S2 auscultated. No extremity edema. Abdomen: Soft, non-tender, non-distended. Bowel sounds positive.  GU: Deferred. Musculoskeletal: No clubbing / cyanosis of digits/nails. No joint deformity upper and lower extremities.  Skin: No rashes, lesions, ulcers on a limited skin evaluation.  No induration; Warm and dry.  Neurologic: CN 2-12 grossly intact with no focal deficits. Romberg sign and cerebellar reflexes not assessed.  Psychiatric: Impaired judgment and insight. Awake but not oriented. Normal mood and appropriate affect.   Data Reviewed: I have personally reviewed following labs and imaging studies  CBC: Recent Labs  Lab 11/07/19 1338 11/08/19 0634 11/09/19 0355 11/10/19 0738 11/11/19 0500  WBC 3.8* 5.2 6.8 6.1 8.7  NEUTROABS  --  4.1 4.4 3.9 6.1  HGB 14.6 13.7 12.8 13.3 14.3  HCT 43.6 40.3 38.4 39.0 41.3  MCV 88.4 86.3 86.3 86.3 85.0  PLT 163 144* 160 186 [id number]   Basic Metabolic Panel: Recent Labs  Lab 11/07/19 1338 11/07/19 1606 11/08/19 0634 11/09/19 0355 11/10/19 0738 11/11/19 0500  NA 137  --  140 143 141 139  K 3.3*  --  3.5 3.4* 4.7  3.7  CL 105  --  106 110 108 107  CO2 21*  --  23 23 26  21*  GLUCOSE 139*  --  114* 108* 99 94  BUN 13  --  13 18 17 21   CREATININE 0.75  --  0.75 0.63 0.65 0.60  CALCIUM 8.7*  --  8.1* 8.1* 8.4* 8.4*  MG  --  2.0 1.8 1.9 2.0 1.9  PHOS  --  3.7 2.1* 3.2 2.7 3.2   GFR: CrCl cannot be calculated (Unknown ideal weight.). Liver Function Tests: Recent Labs  Lab 11/07/19 1606 11/08/19 0634 11/09/19 0355 11/10/19 0738 11/11/19 0500  AST 36 34 30 29 27   ALT 16 19 16 18 22   ALKPHOS 47 41 36* 37* 37*  BILITOT 0.5 0.9 0.6 0.5 0.5  PROT 7.4 6.0* 5.9* 5.6* 6.0*  ALBUMIN 3.7 3.1* 2.9* 2.8* 3.0*   Recent Labs  Lab 11/07/19 1606  LIPASE 30   No results for input(s): AMMONIA in the last 168 hours. Coagulation Profile: No results for input(s): INR, PROTIME in the last 168 hours. Cardiac Enzymes: No results for input(s): CKTOTAL, CKMB, CKMBINDEX, TROPONINI in the last 168 hours. BNP (last 3 results) No results for input(s): PROBNP in the last 8760 hours. HbA1C: No results for input(s): HGBA1C in the last 72 hours. CBG: Recent Labs  Lab 11/07/19 1522  GLUCAP 132*   Lipid Profile: No results for  input(s): CHOL, HDL, LDLCALC, TRIG, CHOLHDL, LDLDIRECT in the last 72 hours. Thyroid Function Tests: No results for input(s): TSH, T4TOTAL, FREET4, T3FREE, THYROIDAB in the last 72 hours. Anemia Panel: Recent Labs    11/10/19 0738 11/11/19 0500  FERRITIN 93 86   Sepsis Labs: Recent Labs  Lab 11/07/19 1606 11/07/19 1915  PROCALCITON <0.10  --   LATICACIDVEN 2.0* 1.3    Recent Results (from the past 240 hour(s))  Novel Coronavirus, NAA ([facility])     Status: Abnormal   Collection Time: 11/03/19 10:27 AM   Specimen: Nasopharyngeal(NP) swabs in vial transport medium   NASOPHARYNGE  SCREENIN  Result Value Ref Range Status   SARS-CoV-2, NAA Detected (A) Not Detected Final    Comment: This nucleic acid amplification test was developed and its performance characteristics determined by [employer]. Nucleic acid amplification tests include PCR and TMA. This test has not been FDA cleared or approved. This test has been authorized by FDA under an Emergency Use Authorization (EUA). This test is only authorized for the duration of time the declaration that circumstances exist justifying the authorization of the emergency use of in vitro diagnostic tests for detection of SARS-CoV-2 virus and/or diagnosis of COVID-19 infection under section 564(b)(1) of the Act, 21 U.S.C. [id number]) (1), unless the authorization is terminated or revoked sooner. When diagnostic testing is negative, the possibility of a false negative result should be considered in the context of a patient's recent exposures and the presence of clinical signs and symptoms consistent with COVID-19. An individual without symptoms of COVID-19 and who is not shedding SARS-CoV-2 virus would  expect to have a negative (not detected) result in this assay.   Blood Culture (routine x 2)     Status: None (Preliminary result)   Collection Time: 11/07/19  4:10 PM   Specimen: BLOOD  Result Value Ref Range Status   Specimen  Description BLOOD LEFT ANTECUBITAL  Final   Special Requests   Final    BOTTLES DRAWN AEROBIC AND ANAEROBIC Blood Culture adequate volume   Culture   Final  NO GROWTH 4 DAYS Performed at [facility], [place] [address] [place], [place] [address]    Report Status PENDING  Incomplete  Blood Culture (routine x 2)     Status: None (Preliminary result)   Collection Time: 11/07/19  4:10 PM   Specimen: BLOOD LEFT HAND  Result Value Ref Range Status   Specimen Description BLOOD LEFT HAND  Final   Special Requests   Final    BOTTLES DRAWN AEROBIC AND ANAEROBIC Blood Culture results may not be optimal due to an inadequate volume of blood received in culture bottles   Culture   Final    NO GROWTH 4 DAYS Performed at [facility], [place] [address] [place], [place] [address]    Report Status PENDING  Incomplete    Radiology Studies: No results found. Scheduled Meds: . vitamin C  500 mg Oral Daily  . dexamethasone (DECADRON) injection  6 mg Intravenous Q24H  . enoxaparin (LOVENOX) injection  40 mg Subcutaneous Q24H  . feeding supplement (ENSURE ENLIVE)  237 mL Oral TID BM  . memantine  10 mg Oral BID  . pravastatin  20 mg Oral q1800  . zinc sulfate  220 mg Oral Daily   Continuous Infusions: . remdesivir 100 mg in NS 100 mL 100 mg (11/11/19 0929)    LOS: 3 days    Kerney Elbe, DO [facility] PAGER is on [place]  If 7PM-7AM, please contact night-coverage [email or web address]

## 2019-11-12 ENCOUNTER — Encounter (HOSPITAL_COMMUNITY): Payer: Self-pay | Admitting: Internal Medicine

## 2019-11-12 DIAGNOSIS — L304 Erythema intertrigo: Secondary | ICD-10-CM | POA: Diagnosis not present

## 2019-11-12 DIAGNOSIS — R5381 Other malaise: Secondary | ICD-10-CM | POA: Diagnosis not present

## 2019-11-12 DIAGNOSIS — N39498 Other specified urinary incontinence: Secondary | ICD-10-CM | POA: Diagnosis not present

## 2019-11-12 DIAGNOSIS — Z7401 Bed confinement status: Secondary | ICD-10-CM | POA: Diagnosis not present

## 2019-11-12 DIAGNOSIS — I6789 Other cerebrovascular disease: Secondary | ICD-10-CM | POA: Diagnosis not present

## 2019-11-12 DIAGNOSIS — E785 Hyperlipidemia, unspecified: Secondary | ICD-10-CM | POA: Diagnosis not present

## 2019-11-12 DIAGNOSIS — G9340 Encephalopathy, unspecified: Secondary | ICD-10-CM | POA: Diagnosis not present

## 2019-11-12 DIAGNOSIS — F0280 Dementia in other diseases classified elsewhere without behavioral disturbance: Secondary | ICD-10-CM | POA: Diagnosis not present

## 2019-11-12 DIAGNOSIS — R410 Disorientation, unspecified: Secondary | ICD-10-CM | POA: Diagnosis not present

## 2019-11-12 DIAGNOSIS — M2550 Pain in unspecified joint: Secondary | ICD-10-CM | POA: Diagnosis not present

## 2019-11-12 DIAGNOSIS — R41841 Cognitive communication deficit: Secondary | ICD-10-CM | POA: Diagnosis not present

## 2019-11-12 DIAGNOSIS — R008 Other abnormalities of heart beat: Secondary | ICD-10-CM | POA: Diagnosis not present

## 2019-11-12 DIAGNOSIS — R2689 Other abnormalities of gait and mobility: Secondary | ICD-10-CM | POA: Diagnosis not present

## 2019-11-12 DIAGNOSIS — U071 COVID-19: Secondary | ICD-10-CM | POA: Diagnosis not present

## 2019-11-12 DIAGNOSIS — E876 Hypokalemia: Secondary | ICD-10-CM | POA: Diagnosis not present

## 2019-11-12 DIAGNOSIS — E7849 Other hyperlipidemia: Secondary | ICD-10-CM | POA: Diagnosis not present

## 2019-11-12 DIAGNOSIS — R270 Ataxia, unspecified: Secondary | ICD-10-CM | POA: Diagnosis not present

## 2019-11-12 DIAGNOSIS — G459 Transient cerebral ischemic attack, unspecified: Secondary | ICD-10-CM | POA: Diagnosis not present

## 2019-11-12 DIAGNOSIS — R2681 Unsteadiness on feet: Secondary | ICD-10-CM | POA: Diagnosis not present

## 2019-11-12 DIAGNOSIS — E46 Unspecified protein-calorie malnutrition: Secondary | ICD-10-CM | POA: Diagnosis not present

## 2019-11-12 DIAGNOSIS — R531 Weakness: Secondary | ICD-10-CM | POA: Diagnosis not present

## 2019-11-12 DIAGNOSIS — G3189 Other specified degenerative diseases of nervous system: Secondary | ICD-10-CM | POA: Diagnosis not present

## 2019-11-12 DIAGNOSIS — I10 Essential (primary) hypertension: Secondary | ICD-10-CM | POA: Diagnosis not present

## 2019-11-12 DIAGNOSIS — M6281 Muscle weakness (generalized): Secondary | ICD-10-CM | POA: Diagnosis not present

## 2019-11-12 LAB — CBC WITH DIFFERENTIAL/PLATELET
Abs Immature Granulocytes: 0.16 10*3/uL — ABNORMAL HIGH (ref 0.00–0.07)
Basophils Absolute: 0 10*3/uL (ref 0.0–0.1)
Basophils Relative: 0 %
Eosinophils Absolute: 0 10*3/uL (ref 0.0–0.5)
Eosinophils Relative: 0 %
HCT: 38.9 % (ref 36.0–46.0)
Hemoglobin: 13.5 g/dL (ref 12.0–15.0)
Immature Granulocytes: 1 %
Lymphocytes Relative: 19 %
Lymphs Abs: 2.2 10*3/uL (ref 0.7–4.0)
MCH: 29.4 pg (ref 26.0–34.0)
MCHC: 34.7 g/dL (ref 30.0–36.0)
MCV: 84.7 fL (ref 80.0–100.0)
Monocytes Absolute: 0.7 10*3/uL (ref 0.1–1.0)
Monocytes Relative: 6 %
Neutro Abs: 8.8 10*3/uL — ABNORMAL HIGH (ref 1.7–7.7)
Neutrophils Relative %: 74 %
Platelets: 243 10*3/uL (ref 150–400)
RBC: 4.59 MIL/uL (ref 3.87–5.11)
RDW: 12.2 % (ref 11.5–15.5)
WBC: 11.9 10*3/uL — ABNORMAL HIGH (ref 4.0–10.5)
nRBC: 0 % (ref 0.0–0.2)

## 2019-11-12 LAB — COMPREHENSIVE METABOLIC PANEL
ALT: 26 U/L (ref 0–44)
AST: 29 U/L (ref 15–41)
Albumin: 2.8 g/dL — ABNORMAL LOW (ref 3.5–5.0)
Alkaline Phosphatase: 39 U/L (ref 38–126)
Anion gap: 9 (ref 5–15)
BUN: 24 mg/dL — ABNORMAL HIGH (ref 8–23)
CO2: 23 mmol/L (ref 22–32)
Calcium: 8.7 mg/dL — ABNORMAL LOW (ref 8.9–10.3)
Chloride: 108 mmol/L (ref 98–111)
Creatinine, Ser: 0.7 mg/dL (ref 0.44–1.00)
GFR calc Af Amer: >60 mL/min (ref >60)
GFR calc non Af Amer: >60 mL/min (ref >60)
Glucose, Bld: 102 mg/dL — ABNORMAL HIGH (ref 70–99)
Potassium: 3.8 mmol/L (ref 3.5–5.1)
Sodium: 140 mmol/L (ref 135–145)
Total Bilirubin: 0.4 mg/dL (ref 0.3–1.2)
Total Protein: 5.6 g/dL — ABNORMAL LOW (ref 6.5–8.1)

## 2019-11-12 LAB — LACTATE DEHYDROGENASE: LDH: 225 U/L — ABNORMAL HIGH (ref 98–192)

## 2019-11-12 LAB — FIBRINOGEN: Fibrinogen: 415 mg/dL (ref 210–475)

## 2019-11-12 LAB — CULTURE, BLOOD (ROUTINE X 2)
Culture: NO GROWTH
Culture: NO GROWTH
Special Requests: ADEQUATE

## 2019-11-12 LAB — C-REACTIVE PROTEIN: CRP: 0.7 mg/dL (ref <1.0)

## 2019-11-12 LAB — MAGNESIUM: Magnesium: 2 mg/dL (ref 1.7–2.4)

## 2019-11-12 LAB — PHOSPHORUS: Phosphorus: 3.8 mg/dL (ref 2.5–4.6)

## 2019-11-12 LAB — FERRITIN: Ferritin: 71 ng/mL (ref 11–307)

## 2019-11-12 LAB — D-DIMER, QUANTITATIVE: D-Dimer, Quant: 0.59 ug/mL-FEU — ABNORMAL HIGH (ref 0.00–0.50)

## 2019-11-12 LAB — SEDIMENTATION RATE: Sed Rate: 26 mm/hr — ABNORMAL HIGH (ref 0–22)

## 2019-11-12 MED ORDER — ZINC SULFATE 220 (50 ZN) MG PO CAPS: 220.0000 mg | ORAL_CAPSULE | Freq: Every day | ORAL | 0 refills | Status: DC

## 2019-11-12 MED ORDER — GUAIFENESIN-DM 100-10 MG/5ML PO SYRP: 10.0000 mL | ORAL_SOLUTION | ORAL | 0 refills | Status: DC | PRN

## 2019-11-12 MED ORDER — ALBUTEROL SULFATE HFA 108 (90 BASE) MCG/ACT IN AERS: 1.0000 | INHALATION_SPRAY | RESPIRATORY_TRACT | 0 refills | Status: DC | PRN

## 2019-11-12 MED ORDER — ASCORBIC ACID 500 MG PO TABS: 500.0000 mg | ORAL_TABLET | Freq: Every day | ORAL | 0 refills | Status: DC

## 2019-11-12 MED ORDER — ACETAMINOPHEN 325 MG PO TABS: 650.0000 mg | ORAL_TABLET | Freq: Four times a day (QID) | ORAL | Status: DC | PRN

## 2019-11-12 MED ORDER — DEXAMETHASONE 6 MG PO TABS: 6.0000 mg | ORAL_TABLET | Freq: Every day | ORAL | 0 refills | Status: AC

## 2019-11-12 MED ORDER — ENSURE ENLIVE PO LIQD: 237.0000 mL | Freq: Three times a day (TID) | ORAL | 12 refills | Status: DC

## 2019-11-12 NOTE — TOC Initial Note (Signed)
Transition of Care [facility]) - Initial/Assessment Note    Patient Details  Name: Tammy Arnold MRN: [id number] Date of Birth: [birth year]  [facility] of [facility]) CM/SW Contact:    Maryclare Labrador, RN Phone Number: 11/12/2019, 10:38 AM  Clinical Narrative:    CM unable to reach wife via phone in pts room.  Pt is only oriented to self.  CM was able to reach pts husband.  Pts husband informed CM that he still works and can not provide  24 hour supervision as recommended on 1/15 and if that continues to be the recommendation pt will require SNF . PT will work with pt today.   Expected Discharge Plan: [place]     Patient Goals and CMS Choice        Expected Discharge Plan and Services Expected Discharge Plan: [place]       Living arrangements for the past 2 months: Single Family Home                                      Prior Living Arrangements/Services Living arrangements for the past 2 months: Single Family Home Lives with:: Spouse Patient language and need for interpreter reviewed:: Yes        Need for Family Participation in Patient Care: Yes (Comment) Care giver support system in place?: Yes (comment)   Criminal Activity/Legal Involvement Pertinent to Current Situation/Hospitalization: No - Comment as needed  Activities of Daily Living Home Assistive Devices/Equipment: None ADL Screening (condition at time of admission) [name] cognitive ability adequate to safely complete daily activities?: No Is the patient deaf or have difficulty hearing?: No Does the patient have difficulty seeing, even when wearing glasses/contacts?: No Does the patient have difficulty concentrating, remembering, or making decisions?: No Patient able to express need for assistance with ADLs?: Yes Does the patient have difficulty dressing or bathing?: No Independently performs ADLs?: No Does the patient have difficulty walking or climbing  stairs?: Yes Weakness of Legs: Both Weakness of Arms/Hands: None  Permission Sought/Granted                  Emotional Assessment       Orientation: : Fluctuating Orientation (Suspected and/or reported [place])      Admission diagnosis:  Gait instability [R26.81] Encephalopathy [G93.40] COVID-19 virus infection [U07.1] COVID-19 [U07.1] Patient Active Problem List   Diagnosis Date Noted  . COVID-19 virus infection 11/07/2019  . Leukocytopenia 11/07/2019  . Back pain 12/27/2017  . Small vessel disease, cerebrovascular   . 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 Ataxia 12/05/2017  . Cognitive impairment   . Abnormal MRI   . Blurry vision   . Hypokalemia   . Acute blood loss anemia   . TIA (transient ischemic attack) 12/04/2017  . Essential hypertension 12/04/2017  . HLD (hyperlipidemia) 12/04/2017   PCP:  Kelton Pillar, MD Pharmacy:   [id number] - [place], [place] [id number] [place] [id number] Phone: [phone] Fax: [phone]     Social Determinants of Health (SDOH) Interventions    Readmission Risk Interventions No flowsheet data found.

## 2019-11-12 NOTE — [place] FL2 (Signed)
[place] LEVEL OF CARE SCREENING TOOL     IDENTIFICATION  Patient Name: Tammy Arnold Birthdate: 04-Jul-1941 Sex: female Admission Date (Current Location): 11/07/2019  [facility] and [place] Number:  Herbalist and Address:  [facility], [place] [address], [place], [place] [address]      Provider Number: [id number]  Attending Physician Name and Address:  Kerney Elbe, DO  Relative Name and Phone Number:  Marcello Moores, spouse, [phone]    Current Level of Care: Hospital Recommended Level of Care: [place] Prior Approval Number:    Date Approved/Denied:   PASRR Number: [id number]  Discharge Plan: SNF    Current Diagnoses: Patient Active Problem List   Diagnosis Date Noted  . COVID-19 virus infection 11/07/2019  . Leukocytopenia 11/07/2019  . Back pain 12/27/2017  . Small vessel disease, cerebrovascular   . 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 Ataxia 12/05/2017  . Cognitive impairment   . Abnormal MRI   . Blurry vision   . Hypokalemia   . Acute blood loss anemia   . TIA (transient ischemic attack) 12/04/2017  . Essential hypertension 12/04/2017  . HLD (hyperlipidemia) 12/04/2017    Orientation RESPIRATION BLADDER Height & Weight     Self  Normal Incontinent, External catheter Weight:   Height:     BEHAVIORAL SYMPTOMS/MOOD NEUROLOGICAL BOWEL NUTRITION STATUS  (Dementia, no behaviors)   Continent Diet(Please see DC Summary)  AMBULATORY STATUS COMMUNICATION OF NEEDS Skin   Limited Assist Verbally Normal                       Personal Care Assistance Level of Assistance  Bathing, Feeding, Dressing Bathing Assistance: Limited assistance Feeding assistance: Limited assistance Dressing Assistance: Limited assistance     Functional Limitations Info  Sight, Hearing, Speech Sight Info: Adequate Hearing Info: Adequate Speech Info: Adequate    SPECIAL CARE FACTORS FREQUENCY  PT (By licensed  PT), OT (By licensed OT)     PT Frequency: 5x OT Frequency: 5x            Contractures Contractures Info: Not present    Additional Factors Info  Code Status, Allergies, Isolation Precautions Code Status Info: Full Allergies Info: Baclofen, Keflex (Cephalexin), Macrodantin (Nitrofurantoin), Neggram (Nalidixic Acid), Nitrofurantoin Monohyd Macro, Penicillins, Sulfa Antibiotics     Isolation Precautions Info: COVID +     Current Medications (11/12/2019):  This is the current hospital active medication list Current Facility-Administered Medications  Medication Dose Route Frequency Provider Last Rate Last Admin  . acetaminophen (TYLENOL) tablet 650 mg  650 mg Oral Q6H PRN Reubin Milan, MD   650 mg at 11/11/19 [id number]   Or  . acetaminophen (TYLENOL) suppository 650 mg  650 mg Rectal Q6H PRN Reubin Milan, MD      . albuterol (VENTOLIN HFA) 108 (90 Base) MCG/ACT inhaler 1-2 puff  1-2 puff Inhalation Q4H PRN Raiford Noble Latif, DO      . ascorbic acid (VITAMIN C) tablet 500 mg  500 mg Oral Daily Reubin Milan, MD   500 mg at 11/12/19 0930  . chlorpheniramine-HYDROcodone (TUSSIONEX) 10-8 MG/5ML suspension 5 mL  5 mL Oral Q12H PRN Reubin Milan, MD      . dexamethasone (DECADRON) injection 6 mg  6 mg Intravenous Q24H Sheikh, Georgina Quint Latif, DO   6 mg at 11/12/19 0930  . enoxaparin (LOVENOX) injection 40 mg  40 mg Subcutaneous Q24H Reubin Milan, MD  40 mg at 11/11/19 2227  . feeding supplement (ENSURE ENLIVE) (ENSURE ENLIVE) liquid 237 mL  237 mL Oral TID BM Sheikh, Omair Latif, DO   237 mL at 11/12/19 0933  . guaiFENesin-dextromethorphan (ROBITUSSIN DM) 100-10 MG/5ML syrup 10 mL  10 mL Oral Q4H PRN Reubin Milan, MD      . memantine [facility]) tablet 10 mg  10 mg Oral BID Raiford Noble Latif, DO   10 mg at 11/12/19 0930  . pravastatin (PRAVACHOL) tablet 20 mg  20 mg Oral q1800 Raiford Noble [place], DO   20 mg at 11/11/19 1853  . zinc sulfate capsule 220 mg   220 mg Oral Daily Reubin Milan, MD   220 mg at 11/12/19 0930     Discharge Medications: Please see discharge summary for a list of discharge medications.  Relevant Imaging Results:  Relevant Lab Results:   Additional Information SS# [id number]  Benard Halsted, LCSW

## 2019-11-12 NOTE — TOC Transition Note (Signed)
Transition of Care [facility]) - CM/SW Discharge Note   Patient Details  Name: LAVONA KREUSER MRN: [id number] Date of Birth: January 17, 1941  Transition of Care [facility]) CM/SW Contact:  Benard Halsted, LCSW Phone Number: 11/12/2019, 2:16 PM   Clinical Narrative:    Patient will DC to: [facility] Anticipated DC date: 11/12/19 Family notified: Spouse, Dr. Ulanda Edison Transport by: Rodell Perna (requested by SNF)   Per MD patient ready for DC to [facility]. RN, patient, patient's family, and facility notified of DC. Discharge Summary and FL2 sent to facility. RN to call report prior to discharge [phone]). DC packet on chart. Ambulance transport requested for patient.   CSW will sign off for now as social work intervention is no longer needed. Please consult [place] again if new needs arise.  Cedric Fishman, LCSW Clinical Social Worker [phone]    Final next level of care: Skilled Nursing Facility Barriers to Discharge: No Barriers Identified   Patient Goals and CMS Choice Patient states their goals for this hospitalization and ongoing recovery are:: Rehab CMS Medicare.gov Compare Post Acute Care list provided to:: Patient Represenative (must comment)(spouse) Choice offered to / list presented to : Spouse  Discharge Placement   Existing PASRR number confirmed : 11/12/19          Patient chooses bed at: [facility] Patient to be transferred to facility by: [place] Name of family member notified: Spouse, Dr. Ulanda Edison Patient and family notified of of transfer: 11/12/19  Discharge Plan and Services In-house Referral: Clinical Social Work                                   Social Determinants of Health ([place]) Interventions     Readmission Risk Interventions No flowsheet data found.

## 2019-11-12 NOTE — Care Management Important Message (Signed)
Important Message  Patient Details  Name: Tammy Arnold MRN: [id number] Date of Birth: 1941-08-24   Medicare Important Message Given:  Yes - Important Message mailed due to current National Emergency  Verbal consent obtained due to current National Emergency  Relationship to patient: Spouse/Significant Other Contact Name: Tammia Supinger Call Date: 11/12/19  Time: [place] Phone: [phone] Outcome: Spoke with contact Important Message mailed to: Other (must enter comment)(Mr Radford declined a copy of the IM, but is aware one is available if needed)    Delorse Lek 11/12/2019, 2:57 PM

## 2019-11-12 NOTE — Evaluation (Signed)
Occupational Therapy Evaluation Patient Details Name: Tammy Arnold MRN: [id number] DOB: 1941-10-15 Today's Date: 11/12/2019    History of Present Illness 79 y.o. female with medical history significant of osteoarthritis, hyperlipidemia, hypertension, CVA who is brought to the emergency department due to generalized weakness unable to work, urinary and fecal incontinence and worsening dementia since 11/03/19. Admitted 11/08/19 for treatment of COVID and acute worsening encephalopathy on chronic dementia.    Clinical Impression   Pt was ambulating without a device and able to perform self care per chart. She presents with significant cognitive impairment with decreased motor planning, sequencing and spatial perception. Pt requires min assist for ambulation and min to mod assist for ADL. Recommending ST rehab in SNF prior to return home. Will defer further OT to SNF as pt will d/c today.    Follow Up Recommendations  SNF;Supervision/Assistance - 24 hour    Equipment Recommendations       Recommendations for Other Services       Precautions / Restrictions Precautions Precautions: Fall Precaution Comments: recalls at least one fall  Restrictions Weight Bearing Restrictions: No      Mobility Bed Mobility               General bed mobility comments: OOB in recliner  Transfers Overall transfer level: Needs assistance Equipment used: 1 person hand held assist Transfers: Sit to/from Stand Sit to Stand: Min assist         General transfer comment: minA for steadying in standing, braces herself using calves on recliner     Balance Overall balance assessment: Needs assistance Sitting-balance support: Feet supported;Feet unsupported;No upper extremity supported;Bilateral upper extremity supported Sitting balance-Leahy Scale: Poor Sitting balance - Comments: increased posterior lean with LE off floor, in attempt at LE assessment, pt with multiple LoB posteriorly into chair    Standing balance support: Bilateral upper extremity supported;During functional activity;Single extremity supported Standing balance-Leahy Scale: Poor Standing balance comment: requires UE support to maintain balance, requires at least single UE support in standing at sink                            ADL either performed or assessed with clinical judgement   ADL Overall ADL's : Needs assistance/impaired Eating/Feeding: Minimal assistance;Sitting   Grooming: Oral care;Wash/dry hands;Minimal assistance;Sitting Grooming Details (indicate cue type and reason): pt with decreased sequencing and problem solving Upper Body Bathing: Moderate assistance;Sitting   Lower Body Bathing: Moderate assistance;Sit to/from stand   Upper Body Dressing : Sitting;Moderate assistance   Lower Body Dressing: Moderate assistance;Sit to/from stand   Toilet Transfer: Minimal assistance;Ambulation;RW   Toileting- Clothing Manipulation and Hygiene: Moderate assistance;Sit to/from stand       Functional mobility during ADLs: Minimal assistance;+2 for safety/equipment       Vision   Additional Comments: impaired visual perception, does not scan the room to locate targets with ambulating     Perception     Praxis      Pertinent Vitals/Pain Pain Assessment: Faces Faces Pain Scale: No hurt     Hand Dominance Right   Extremity/Trunk Assessment Upper Extremity Assessment Upper Extremity Assessment: Overall WFL for tasks assessed   Lower Extremity Assessment Lower Extremity Assessment: Defer to PT evaluation       Communication Communication Communication: No difficulties(some difficulty with word retrieval)   Cognition Arousal/Alertness: Awake/alert Behavior During Therapy: WFL for tasks assessed/performed Overall Cognitive Status: History of cognitive impairments - at baseline  General Comments: pt is very pleasant   General  Comments  SaO2 on RA >90%O2 throughout session, periods of time with poor pleth waveform     Exercises     Shoulder Instructions      Home Living Family/patient expects to be discharged to:: Private residence Living Arrangements: Spouse/significant other Available Help at Discharge: Family;Available PRN/intermittently Type of Home: House Home Access: Stairs to enter     Home Layout: One level                          Prior Functioning/Environment Level of Independence: Needs assistance  Gait / Transfers Assistance Needed: reports community distance ambulation ADL's / Homemaking Assistance Needed: reports independence with bathing and dressing             OT Problem List: Impaired balance (sitting and/or standing);Decreased cognition;Decreased safety awareness      OT Treatment/Interventions:      OT Goals(Current goals can be found in the care plan section) Acute Rehab OT Goals Patient Stated Goal: get warm  OT Frequency:     Barriers to D/C: Decreased caregiver support          Co-evaluation PT/OT/SLP Co-Evaluation/Treatment: Yes Reason for Co-Treatment: For patient/therapist safety PT goals addressed during session: Mobility/safety with mobility OT goals addressed during session: ADL's and self-care      AM-PAC OT "6 Clicks" Daily Activity     Outcome Measure Help from another person eating meals?: A Little Help from another person taking care of personal grooming?: A Little Help from another person toileting, which includes using toliet, bedpan, or urinal?: A Lot Help from another person bathing (including washing, rinsing, drying)?: A Lot Help from another person to put on and taking off regular upper body clothing?: A Little Help from another person to put on and taking off regular lower body clothing?: A Lot 6 Click Score: 15   End of Session Equipment Utilized During Treatment: Gait belt Nurse Communication: Mobility status  Activity  Tolerance: Patient tolerated treatment well Patient left: in chair;with call bell/phone within reach;with chair alarm set  OT Visit Diagnosis: Other abnormalities of gait and mobility (R26.89);Other symptoms and signs involving cognitive function                Time: 1050-1116 OT Time Calculation (min): 26 min Charges:  OT General Charges $OT Visit: 1 Visit OT Evaluation $OT Eval Moderate Complexity: 1 Mod  Nestor Lewandowsky, OTR/L Acute Rehabilitation Services Pager: [phone] Office: [phone] Malka So 11/12/2019, 2:02 PM

## 2019-11-12 NOTE — Progress Notes (Addendum)
Physical Therapy Re-Evaluation and Treatment Patient Details Name: Tammy Arnold MRN: [id number] DOB: 1940/10/29 Today's Date: 11/12/2019    History of Present Illness 79 y.o. female with medical history significant of osteoarthritis, hyperlipidemia, hypertension, CVA who is brought to the emergency department due to generalized weakness unable to work, urinary and fecal incontinence and worsening dementia since 11/03/19. Admitted 11/08/19 for treatment of COVID and acute worsening encephalopathy on chronic dementia.     PT Comments    Pt husband reports that he can not provide 24 hour support at home, pt reassessed for safety with mobility. Pt sitting up in recliner on entry agreeable to getting up with therapy to brush her teeth. Pt is limited in safe mobility by dementia resulting in poor cognition and motor planning especially in novel situations, leading to decreased strength and balance. Pt is minA for power up and modA for steadying with sit>stand and minA for ambulation without AD with 1x modA for LoB. Given level of assist required for safe mobility, PT now recommending SNF level rehab at discharge with consideration of Memory Care placement in future. PT will continue to follow acutely.      Follow Up Recommendations  SNF     Equipment Recommendations  (TBD at next venue)    Recommendations for Other Services       Precautions / Restrictions Precautions Precautions: Fall Precaution Comments: recalls at least one fall  Restrictions Weight Bearing Restrictions: No    Mobility  Bed Mobility               General bed mobility comments: OOB in recliner  Transfers Overall transfer level: Needs assistance Equipment used: Rolling walker (2 wheeled) Transfers: Sit to/from Stand Sit to Stand: Min assist         General transfer comment: minA for steadying in standing, braces herself using calves on recliner   Ambulation/Gait Ambulation/Gait assistance: Min  assist;Mod assist Gait Distance (Feet): 22 Feet Assistive device: 1 person hand held assist Gait Pattern/deviations: Step-through pattern;Decreased step length - right;Decreased step length - left;Shuffle;Ataxic Gait velocity: slowed Gait velocity interpretation: <1.8 ft/sec, indicate of risk for recurrent falls General Gait Details: minA for steadying with mildly ataxic gait, poor motor planning and perception with ambulation to and from sink to brush teeth       Balance Overall balance assessment: Needs assistance Sitting-balance support: Feet supported;Feet unsupported;No upper extremity supported;Bilateral upper extremity supported Sitting balance-Leahy Scale: Poor Sitting balance - Comments: increased posterior lean with LE off floor, in attempt at LE assessment, pt with multiple LoB posteriorly into chair   Standing balance support: Bilateral upper extremity supported;During functional activity;Single extremity supported Standing balance-Leahy Scale: Poor Standing balance comment: requires UE support to maintain balance, requires at least single UE support in standing at sink                             Cognition Arousal/Alertness: Awake/alert Behavior During Therapy: WFL for tasks assessed/performed Overall Cognitive Status: History of cognitive impairments - at baseline                                           General Comments General comments (skin integrity, edema, etc.): SaO2 on RA >90%O2 throughout session, periods of time with poor pleth waveform       Pertinent Vitals/Pain Pain Assessment: No/denies  pain    Home Living Family/patient expects to be discharged to:: Private residence Living Arrangements: Spouse/significant other                      PT Goals (current goals can now be found in the care plan section) Acute Rehab PT Goals Patient Stated Goal: get warm PT Goal Formulation: Patient unable to participate in goal  setting Time For Goal Achievement: 11/23/19 Potential to Achieve Goals: Fair Progress towards PT goals: Progressing toward goals    Frequency    Min 3X/week      PT Plan Discharge plan needs to be updated       AM-PAC PT "6 Clicks" Mobility   Outcome Measure  Help needed turning from your back to your side while in a flat bed without using bedrails?: A Little Help needed moving from lying on your back to sitting on the side of a flat bed without using bedrails?: A Little Help needed moving to and from a bed to a chair (including a wheelchair)?: A Lot Help needed standing up from a chair using your arms (e.g., wheelchair or bedside chair)?: A Lot Help needed to walk in hospital room?: A Lot Help needed climbing 3-5 steps with a railing? : Total 6 Click Score: 13    End of Session Equipment Utilized During Treatment: Gait belt Activity Tolerance: Patient tolerated treatment well Patient left: in chair;with call bell/phone within reach;with chair alarm set Nurse Communication: Mobility status PT Visit Diagnosis: Unsteadiness on feet (R26.81);Other abnormalities of gait and mobility (R26.89);Ataxic gait (R26.0);Muscle weakness (generalized) (M62.81);Difficulty in walking, not elsewhere classified (R26.2)     Time: [id number] PT Time Calculation (min) (ACUTE ONLY): 26 min  Charges:  $Gait Training: 8-22 mins                     [name] B. Migdalia Dk PT, DPT Acute Rehabilitation Services Pager [phone] Office [phone]    [place] 11/12/2019, 1:15 PM

## 2019-11-12 NOTE — Plan of Care (Signed)
  Problem: Education: Goal: Knowledge of risk factors and measures for prevention of condition will improve Outcome: Not Progressing   Problem: Education: Goal: Knowledge of General Education information will improve Description: Including pain rating scale, medication(s)/side effects and non-pharmacologic comfort measures Outcome: Not Progressing   Problem: Clinical Measurements: Goal: Ability to maintain clinical measurements within normal limits will improve Outcome: Not Progressing Goal: Will remain free from infection Outcome: Not Progressing Goal: Diagnostic test results will improve Outcome: Not Progressing Goal: Respiratory complications will improve Outcome: Not Progressing Goal: Cardiovascular complication will be avoided Outcome: Not Progressing   Problem: Activity: Goal: Risk for activity intolerance will decrease Outcome: Not Progressing   Problem: Safety: Goal: Ability to remain free from injury will improve Outcome: Not Progressing

## 2019-11-12 NOTE — Discharge Summary (Signed)
Physician Discharge Summary  Tammy Arnold [id number] DOB: [birth year] DOA: 11/07/2019  PCP: Kelton Pillar, MD  Admit date: 11/07/2019 Discharge date: 11/12/2019  Admitted From: Home Disposition: SNF  Recommendations for Outpatient Follow-up:  1. Follow up with PCP in 1-2 weeks 2. Follow-up with neurology in outpatient setting for her advanced dementia and management 3. Please obtain CMP/CBC, Mag, Phos in one week 4. Repeat a chest x-ray in 3 to 6 weeks 5. Please follow up on the following pending results:  Home Health: No Equipment/Devices: LW Wheelchair with Cushion for Home   Discharge Condition: Stable CODE STATUS: FULL CODE   Diet recommendation: Regular Diet   Brief/Interim Summary: HPI per Dr. Tennis Must on 11/06/2018 Tammy Cleverly Henleyis a 79 y.o.femalewith medical history significant ofosteoarthritis, hyperlipidemia, hypertension, CVA who is brought to the emergency department due to generalized weakness, urinary and fecal incontinence and worsening dementia since Saturday (4 days ago). On that day, the patient was also diagnosed with COVID-19 virus infection. Earlier, the patient told Dr. Laverta Baltimore that she has been unable to ambulate on her own and is a lot more confused than usual. She has not been drinking or eating much. Last night, the patient had to sleep on the floor of the living room because she was unable to get up to get in bed. Her husband, who happens to be a physician, call her PCP and was told to call 911. She is currently satting in the high 90s on room air. Her husband stated her earlier that she has not had any respiratory symptoms so far.  ED Course:Initial vital signs temperature 100 F, pulse 83, respirations 18, blood pressure 160/78 mmHg and O2 sat 99% on room air. The patient received a 500 mL bolus in the emergency department.  Her urinalysis showed ketonuria 20 and proteinuria 100 mg/dL. CBC showed a white count of 3.8, hemoglobin 14.6 g/dL  and platelets 163. Blood cultures x2 were drawn. D-dimer was 1.46 and fibrinogen 586. CRP 3.7, LDH 295. Procalcitonin was less than 0.1 ng/mL. Lactic acid was 2.0 and then 1.3 mmol/L. LFTs and renal function were normal. Potassium was 3.3 and CO2 21 mmol/L. The rest of the electrolytes so far within normal limits when calcium is corrected to albumin. Glucose 139 mg/dL. Her chest radiograph was clear.  **Interim History  Her appetite is improving but she remains significantly confused and demented.  Started on remdesivir and steroids and methylprednisolone will be stopped and dexamethasone will be initiated.  Inflammatory markers to be trended and PT OT to evaluate and treat along with a dietitian.  Inflammatory markers are starting to trend down now.  Hypokalemia is now being repleted and dietitian has now evaluated and liberalize the diet.  She is stable and improving and anticipating discharge home with home physical therapy Monday after completion of remdesivir.   The night before last became bradycardic and Hypertensive. Held her Donepezil due to bradycardia.   She completed her remdesivir course and was stable and not requiring any supplemental oxygen via nasal cannula.  PT OT evaluated again and recommending SNF now.  Social worker was consulted for assistance with placement and found a bed at [facility] and patient's husband accept his bed.  She will be discharged to skilled nursing facility and will need to follow-up with PCP and neurology in the outpatient setting in 1 to 2 weeks.  Recommend repeating a chest x-ray in 3 to 6 weeks and recommend continuing to monitor patient's respiratory status however this has  been very stable this hospitalization.Marland Kitchen  Discharge Diagnoses:  Principal Problem:   COVID-19 virus infection Active Problems:   Essential hypertension   HLD (hyperlipidemia)   Hypokalemia   Leukocytopenia  COVID-19 Virus Infection -Admitted as Observation but  changed to Inpatient Telemetry given continued Treatment for COVID and because the patient is now spiking Temperatures -Had a TMax of 101.1 the day before yesterday but has been afebrile last 24 hours with a T-max of 98.7 -No oxygen requirement at this time and no coughing appreciated -Had only gastrointestinal symptoms and interestingly Husband reports incontinence -Given Gentle and time-limited IV hydration with NS + 40 mEQ of KCl at 50 mL/hr which is now stopped -CXR showed "No focal consolidation, pleural effusion or pneumothorax. The cardiac silhouette is within normal limits. Atherosclerotic calcification of the aorta. No acute osseous pathology." -Checked Baseline Inflammatory Markers and Trend Daily: Recent Labs    11/10/19 0738 11/11/19 0500 11/12/19 0356  DDIMER 0.59* 0.66* 0.59*  FERRITIN 93 86 71  LDH 243* 237* 225*  CRP 1.1* 0.8 0.7  -PCT was <0.10 so no Role for Abx currently  -Fibrinogen went from 586 -> 496 -> 470 -> 482 -> 415 -TG was 61  Lab Results  Component Value Date   SARSCOV2NAA Detected (A) 11/03/2019   [place] Not Detected 08/21/2019   -SpO2: 100 %; Provide Supplemental O2 via  if Necessary -Started Treatment with 5 Days of Remdesivir and 10 Days of Steroids; Completed 5 days of Remdesivir and she received 40 mg of Solumedrol the day before yesterday and this was changed to 6 mg of Dexamethasone and will continue for 10 days total -C/w Antitussives with Guaifenesin-Dextromethorphan 10 mL po q4hprn Cough and Chlorpheniramine-Hydrocodone 5 mL po q12hprn -Check Blood Cx x2 and showed NGTD at 5 Days -Prone as much as possibleif able -C/w Zinc 220 mg po Daily and Vitamin C  -Airborne and Contact Precautions -C/w Albuterol 1-2 puff IH q4hprn Wheezing and SOB -Add Flutter Valve and Incentive Spirometry  -Continue to monitor and trend inflammatory markers and repeat chest x-ray imaging in the a.m.  Essential Hypertension -Held lisinopril during  hospitalization but ok to Resume at D/C   -Continue to Monitor blood pressures per Protocol, renal function as well electrolytes. -Last BP was 134/87  HLD (hyperlipidemia) -On Pravastatin 20 mg po qHS which has been resumed  Acute Worsening Encephalopathy on Chronic Dementia -Progressive and worsening per husband and per husband report she was unable to walk and was much more confused than normal and has not been eating or drinking well and developed both stool and urine incontinence is not typical of her and she has been sleeping on the floor of the living because she cannot get out of bed -Head CT showed "Generalized ventricular enlargement has increased from comparison exam and appears somewhat out of proportion to the degree of sulcal dilatation. Findings could reflect developing normal pressure hydrocephaly on a background of ex vacuo dilatation and parenchymal volume loss. No CT evidence of acute infarct. If persisting concern for acute infarction, consider MRI which is more sensitive and specific for early changes particularly given the background of chronic microvascular angiopathy." -MRI showed "No acute finding including infarct. Atrophy that is advanced and progressive from 2019. Chronic small vessel ischemia.  Moderately motion degraded." -Hold Donepezil 10 mg po qHS ; C/w Memantine 10 mg po BID -Delirium Precautions  -PT/OT evaluation and treat and recommending Home Health initially and now recommendations are for SNF -Have asked Nursing to Ambulate  with the Patient  -Still continues to be Pleasantly Demented  -Needs outpatient Neurology evaluation for worsening Dementia   Hypokalemia -Was 3.3 on admission and Now K+ is 3.8 -Mag Level wnL at 2.0 -Continue to Monitor and Replete as Necessary -Repeat CMP in AM   Leukopenia, improved  and now has Leukocytosis  -Likely due to coronavirus initially and now has a Leukocytosis in the setting of IV Steroid Demargination  -Continue  to Monitor WBC closely as this is improved and WBC is now 9.7 -> 11.9 -Repeat CBC in AM  Thrombocytopenia, improved  -[name] Platelet Count went from 163 -> 144 -> 160 -> 186 -> 202 -> 243 -Continue to Monitor for S/Sx of Bleeding -Repeat CBC in AM   Hypophosphatemia -[name] Phos Level is now 3.8 -Continue to Monitor and Replete as Necessary -Repeat Phos Level in AM   Generalized Weakness in the setting of COVID-19 disease  Poor Po Intake -Obtain PT/OT Evaluations and recommending Home Health with WheelChair and Wheelchair Cushion but now recommending SNF -Appreciate Nutritionist Consultation and  Dietitian recommends to Liberalize Diet to Regular Diet and Adding Ensure Enlive po TID, and Magic Cup BID with Lunch and Dinner -Repeat PT Evaluation recommend SNF so will D/C to SNF when Bed is available  Bradycardia -Became bradycardic the night before last  -Continued to Monitor on Telemetry -Hold Donepezil for now and will need PCP/Neurology Evaluation for continuing this medication   Discharge Instructions Discharge Instructions    Call MD for:  difficulty breathing, headache or visual disturbances   Complete by: As directed    Call MD for:  extreme fatigue   Complete by: As directed    Call MD for:  hives   Complete by: As directed    Call MD for:  persistant dizziness or light-headedness   Complete by: As directed    Call MD for:  persistant nausea and vomiting   Complete by: As directed    Call MD for:  redness, tenderness, or signs of infection (pain, swelling, redness, odor or green/yellow discharge around incision site)   Complete by: As directed    Call MD for:  severe uncontrolled pain   Complete by: As directed    Call MD for:  temperature >100.4   Complete by: As directed    Diet general   Complete by: As directed    Discharge instructions   Complete by: As directed    You were cared for by a hospitalist during your hospital stay. If you have any  questions about your discharge medications or the care you received while you were in the hospital after you are discharged, you can call the unit and ask to speak with the hospitalist on call if the hospitalist that took care of you is not available. Once you are discharged, your primary care physician will handle any further medical issues. Please note that NO REFILLS for any discharge medications will be authorized once you are discharged, as it is imperative that you return to your primary care physician (or establish a relationship with a primary care physician if you do not have one) for your aftercare needs so that they can reassess your need for medications and monitor your lab values.  Follow up with PCP and Neurology in the outpatient setting. Take all medications as prescribed. If symptoms change or worsen please return to the ED for evaluation   Infection Prevention Recommendations for Individuals Confirmed to have, or Being Evaluated for, 2019 Novel Coronavirus (COVID-19)  Infection Who Receive Care at Home  Individuals who are confirmed to have, or are being evaluated for, COVID-19 should follow the prevention steps below until a healthcare provider or local or state health department says they can return to normal activities.  Stay home except to get medical care You should restrict activities outside your home, except for getting medical care. Do not go to work, school, or public areas, and do not use public transportation or taxis.  Call ahead before visiting your doctor Before your medical appointment, call the healthcare provider and tell them that you have, or are being evaluated for, COVID-19 infection. This will help the healthcare provider's office take steps to keep other people from getting infected. Ask your healthcare provider to call the local or state health department.  Monitor your symptoms Seek prompt medical attention if your illness is worsening (e.g., difficulty  breathing). Before going to your medical appointment, call the healthcare provider and tell them that you have, or are being evaluated for, COVID-19 infection. Ask your healthcare provider to call the local or state health department.  Wear a facemask You should wear a facemask that covers your nose and mouth when you are in the same room with other people and when you visit a healthcare provider. People who live with or visit you should also wear a facemask while they are in the same room with you.  Separate yourself from other people in your home As much as possible, you should stay in a different room from other people in your home. Also, you should use a separate bathroom, if available.  Avoid sharing household items You should not share dishes, drinking glasses, cups, eating utensils, towels, bedding, or other items with other people in your home. After using these items, you should wash them thoroughly with soap and water.  Cover your coughs and sneezes Cover your mouth and nose with a tissue when you cough or sneeze, or you can cough or sneeze into your sleeve. Throw used tissues in a lined trash can, and immediately wash your hands with soap and water for at least 20 seconds or use an alcohol-based hand rub.  Wash your [facility] your hands often and thoroughly with soap and water for at least 20 seconds. You can use an alcohol-based hand sanitizer if soap and water are not available and if your hands are not visibly dirty. Avoid touching your eyes, nose, and mouth with unwashed hands.   Prevention Steps for Caregivers and Household Members of Individuals Confirmed to have, or Being Evaluated for, COVID-19 Infection Being Cared for in the Home  If you live with, or provide care at home for, a person confirmed to have, or being evaluated for, COVID-19 infection please follow these guidelines to prevent infection:  Follow healthcare provider's instructions Make sure that you  understand and can help the patient follow any healthcare provider instructions for all care.  Provide for the patient's basic needs You should help the patient with basic needs in the home and provide support for getting groceries, prescriptions, and other personal needs.  Monitor the patient's symptoms If they are getting sicker, call his or her medical provider and tell them that the patient has, or is being evaluated for, COVID-19 infection. This will help the healthcare provider's office take steps to keep other people from getting infected. Ask the healthcare provider to call the local or state health department.  Limit the number of people who have contact with the patient If  possible, have only one caregiver for the patient. Other household members should stay in another home or place of residence. If this is not possible, they should stay in another room, or be separated from the patient as much as possible. Use a separate bathroom, if available. Restrict visitors who do not have an essential need to be in the home.  Keep older adults, very young children, and other sick people away from the patient Keep older adults, very young children, and those who have compromised immune systems or chronic health conditions away from the patient. This includes people with chronic heart, lung, or kidney conditions, diabetes, and cancer.  Ensure good ventilation Make sure that shared spaces in the home have good air flow, such as from an air conditioner or an opened window, weather permitting.  Wash your hands often Wash your hands often and thoroughly with soap and water for at least 20 seconds. You can use an alcohol based hand sanitizer if soap and water are not available and if your hands are not visibly dirty. Avoid touching your eyes, nose, and mouth with unwashed hands. Use disposable paper towels to dry your hands. If not available, use dedicated cloth towels and replace them when they  become wet.  Wear a facemask and gloves Wear a disposable facemask at all times in the room and gloves when you touch or have contact with the patient's blood, body fluids, and/or secretions or excretions, such as sweat, saliva, sputum, nasal mucus, vomit, urine, or feces.  Ensure the mask fits over your nose and mouth tightly, and do not touch it during use. Throw out disposable facemasks and gloves after using them. Do not reuse. Wash your hands immediately after removing your facemask and gloves. If your personal clothing becomes contaminated, carefully remove clothing and launder. Wash your hands after handling contaminated clothing. Place all used disposable facemasks, gloves, and other waste in a lined container before disposing them with other household waste. Remove gloves and wash your hands immediately after handling these items.  Do not share dishes, glasses, or other household items with the patient Avoid sharing household items. You should not share dishes, drinking glasses, cups, eating utensils, towels, bedding, or other items with a patient who is confirmed to have, or being evaluated for, COVID-19 infection. After the person uses these items, you should wash them thoroughly with soap and water.  Wash laundry thoroughly Immediately remove and wash clothes or bedding that have blood, body fluids, and/or secretions or excretions, such as sweat, saliva, sputum, nasal mucus, vomit, urine, or feces, on them. Wear gloves when handling laundry from the patient. Read and follow directions on labels of laundry or clothing items and detergent. In general, wash and dry with the warmest temperatures recommended on the label.  Clean all areas the individual has used often Clean all touchable surfaces, such as counters, tabletops, doorknobs, bathroom fixtures, toilets, phones, keyboards, tablets, and bedside tables, every day. Also, clean any surfaces that may have blood, body fluids, and/or  secretions or excretions on them. Wear gloves when cleaning surfaces the patient has come in contact with. Use a diluted bleach solution (e.g., dilute bleach with 1 part bleach and 10 parts water) or a household disinfectant with a label that says EPA-registered for coronaviruses. To make a bleach solution at home, add 1 tablespoon of bleach to 1 quart (4 cups) of water. For a larger supply, add  cup of bleach to 1 gallon (16 cups) of water. Read labels of  cleaning products and follow recommendations provided on product labels. Labels contain instructions for safe and effective use of the cleaning product including precautions you should take when applying the product, such as wearing gloves or eye protection and making sure you have good ventilation during use of the product. Remove gloves and wash hands immediately after cleaning.  Monitor yourself for signs and symptoms of illness Caregivers and household members are considered close contacts, should monitor their health, and will be asked to limit movement outside of the home to the extent possible. Follow the monitoring steps for close contacts listed on the symptom monitoring form.   ? If you have additional questions, contact your local health department or call the epidemiologist on call at [phone] (available 24/7). ? This guidance is subject to change. For the most up-to-date guidance from [facility], please refer to their website: YouBlogs.pl  COVID-19 Vaccine Information can be found at: ShippingScam.co.uk For questions related to vaccine distribution or appointments, please [email or web address] or [phone].   Increase activity slowly   Complete by: As directed      Allergies as of 11/12/2019      Reactions   Baclofen Other (See Comments)   Caused brain dysfunction   Keflex [cephalexin] Rash    Macrodantin [nitrofurantoin] Rash   Neggram [nalidixic Acid] Rash   Nitrofurantoin Monohyd Macro Rash   Penicillins Rash   Has patient had a PCN reaction causing immediate rash, facial/tongue/throat swelling, SOB or lightheadedness with hypotension: Yes Has patient had a PCN reaction causing severe rash involving mucus membranes or skin necrosis: No Has patient had a PCN reaction that required hospitalization: No Has patient had a PCN reaction occurring within the last 10 years: No If all of the above answers are "NO", then may proceed with Cephalosporin use.   Sulfa Antibiotics Rash      Medication List    STOP taking these medications   donepezil 10 MG tablet Commonly known as: ARICEPT     TAKE these medications   acetaminophen 325 MG tablet Commonly known as: TYLENOL Take 2 tablets (650 mg total) by mouth every 6 (six) hours as needed for mild pain (or Fever >/= 101). What changed:   medication strength  how much to take  when to take this  reasons to take this   albuterol 108 (90 Base) MCG/ACT inhaler Commonly known as: VENTOLIN HFA Inhale 1-2 puffs into the lungs every 4 (four) hours as needed for wheezing or shortness of breath.   ascorbic acid 500 MG tablet Commonly known as: VITAMIN C Take 1 tablet (500 mg total) by mouth daily. Start taking on: November 13, 2019   dexamethasone 6 MG tablet Commonly known as: DECADRON Take 1 tablet (6 mg total) by mouth daily for 5 days.   feeding supplement (ENSURE ENLIVE) Liqd Take 237 mLs by mouth 3 (three) times daily between meals.   guaiFENesin-dextromethorphan 100-10 MG/5ML syrup Commonly known as: ROBITUSSIN DM Take 10 mLs by mouth every 4 (four) hours as needed for cough.   lisinopril 2.5 MG tablet Commonly known as: ZESTRIL Take 1 tablet (2.5 mg total) by mouth daily.   memantine 10 MG tablet Commonly known as: NAMENDA Take 1 tablet (10 mg total) by mouth 2 (two) times daily.   pravastatin 20 MG  tablet Commonly known as: PRAVACHOL Take 1 tablet (20 mg total) by mouth daily at 6 PM.   zinc sulfate 220 (50 Zn) MG capsule Take 1 capsule (220 mg total) by mouth daily. Start taking  on: November 13, 2019            Durable Medical Equipment  (From admission, onward)         Start     Ordered   11/12/19 [id number]  For home use only DME lightweight manual wheelchair with seat cushion  Once    Comments: Patient suffers from Cognitive Impairment and Advanced Dementat which impairs their ability to perform daily activities like Ambulation in the home.  A [place]ile will not resolve issue with performing activities of daily living due to her not able to follow verbal cues. A wheelchair will allow patient to safely perform daily activities. Patient is not able to propel themselves in the home using a standard weight wheelchair due to Generalized Weakness and Deconditioning. Patient can self propel in the lightweight wheelchair. Length of needed is Indefinitely. Accessories: elevating leg rests (ELRs), wheel locks, extensions and anti-tippers.   11/12/19 [id number]          Contact information for follow-up providers    Kelton Pillar, MD. Call.   Specialty: Family Medicine Why: Follow up within 1 week Contact information: [address] [place] [address] [phone]        [facility] Follow up.   Specialty: Neurology Why: Outpatient evaluation for worsening Dementia  Contact information: [place] [phone]           Contact information for after-discharge care    Destination    [facility] .   Service: Skilled Nursing Contact information: [place] [address] [phone]                 Allergies  Allergen Reactions  . Baclofen Other (See Comments)    Caused brain dysfunction  . Keflex [Cephalexin] Rash  . Macrodantin  [Nitrofurantoin] Rash  . Neggram [Nalidixic Acid] Rash  . Nitrofurantoin Monohyd Macro Rash  . Penicillins Rash    Has patient had a PCN reaction causing immediate rash, facial/tongue/throat swelling, SOB or lightheadedness with hypotension: Yes Has patient had a PCN reaction causing severe rash involving mucus membranes or skin necrosis: No Has patient had a PCN reaction that required hospitalization: No Has patient had a PCN reaction occurring within the last 10 years: No If all of the above answers are "NO", then may proceed with Cephalosporin use.  . Sulfa Antibiotics Rash   Consultations:  None  Procedures/Studies: CT Head Wo Contrast  Result Date: 11/07/2019 CLINICAL DATA:  Neuro deficit, acute, stroke suspected EXAM: CT HEAD WITHOUT CONTRAST TECHNIQUE: Contiguous axial images were obtained from the base of the skull through the vertex without intravenous contrast. COMPARISON:  MRI 12/06/2017, CT head 12/04/2017, perfusion study 12/05/2017 FINDINGS: Brain: Generalized ventricular enlargement has increased from comparison exam and appears somewhat out of proportion to the degree of sulcal dilatation. No evidence of acute infarction, hemorrhage, extra-axial collection or mass lesion/mass effect. Confluent areas of white matter hypoattenuation are most compatible with chronic microvascular angiopathy. Vascular: Atherosclerotic calcification of the carotid siphons and intradural vertebral arteries. No hyperdense vessel. Skull: No calvarial fracture or suspicious osseous lesion. No scalp swelling or hematoma. Sinuses/Orbits: Mild mural thickening in the ethmoids, left maxillary, and sphenoid sinuses without air-fluid levels. Mastoid air cells are predominantly clear. Debris noted in both external auditory canals. Prior left lens extraction. No other gross orbital abnormality. Other: None IMPRESSION: 1. Generalized ventricular enlargement has increased from comparison exam and appears somewhat  out  of proportion to the degree of sulcal dilatation. Findings could reflect developing normal pressure hydrocephaly on a background of ex vacuo dilatation and parenchymal volume loss. 2. No CT evidence of acute infarct. If persisting concern for acute infarction, consider MRI which is more sensitive and specific for early changes particularly given the background of chronic microvascular angiopathy. Electronically Signed   By: Lovena Le M.D.   On: 11/07/2019 21:55   MR BRAIN WO CONTRAST  Result Date: 11/08/2019 CLINICAL DATA:  Ataxia with stroke suspected EXAM: MRI HEAD WITHOUT CONTRAST TECHNIQUE: Multiplanar, multiecho pulse sequences of the brain and surrounding structures were obtained without intravenous contrast. COMPARISON:  Head CT from yesterday FINDINGS: Brain: No acute infarction, hemorrhage, obstructive hydrocephalus, extra-axial collection or mass lesion. Prominent ventriculomegaly without secondary signs of normal pressure hydrocephalus, likely advanced central predominant atrophy. Atrophy has notably progressed from 2019. Chronic small vessel ischemia in the deep periventricular white matter. No chronic blood products Vascular: Normal flow voids Skull and upper cervical spine: Negative for marrow lesion Sinuses/Orbits: Left cataract resection and moderate bilateral sinusitis. Other: Motion degraded to the degree that findings could be obscured. IMPRESSION: 1. No acute finding including infarct. 2. Atrophy that is advanced and progressive from 2019. 3. Chronic small vessel ischemia. 4. Moderately motion degraded. Electronically Signed   By: Monte Fantasia M.D.   On: 11/08/2019 06:09   DG Chest Port 1 View  Result Date: 11/07/2019 CLINICAL DATA:  79 year old female with positive COVID-19. EXAM: PORTABLE CHEST 1 VIEW COMPARISON:  Chest radiograph dated 12/07/2017. FINDINGS: No focal consolidation, pleural effusion or pneumothorax. The cardiac silhouette is within normal limits. Atherosclerotic  calcification of the aorta. No acute osseous pathology. IMPRESSION: No active disease. Electronically Signed   By: Anner Crete M.D.   On: 11/07/2019 16:56    Subjective: Seen and examined at bedside she was again sitting in chair.  No complaints but remains pleasantly demented.  Oxygen saturation is 100% on room air.  PT reevaluated and she remains weak and recommendations have been changed to skilled nursing facility.  Husband updated and plan of care decided she will be discharged to SNF once bed is available.  Discharge Exam: Vitals:   11/12/19 0400 11/12/19 1000  BP: (!) 152/84 134/87  Pulse: 61 64  Resp: (!) 21 15  Temp: 97.8 F (36.6 C)   SpO2: 95% 100%   Vitals:   11/12/19 0200 11/12/19 0300 11/12/19 0400 11/12/19 1000  BP: 135/64 (!) 158/65 (!) 152/84 134/87  Pulse: (!) 55 (!) 53 61 64  Resp: 15 15 (!) 21 15  Temp:   97.8 F (36.6 C)   TempSrc:   Oral   SpO2: 96% 95% 95% 100%   General: Pt is alert, awake, not in acute distress Cardiovascular: RRR, S1/S2 +, no rubs, no gallops Respiratory: CTA bilaterally, no wheezing, no rhonchi Abdominal: Soft, NT, ND, bowel sounds + Extremities: no edema, no cyanosis  The results of significant diagnostics from this hospitalization (including imaging, microbiology, ancillary and laboratory) are listed below for reference.    Microbiology: Recent Results (from the past 240 hour(s))  Novel Coronavirus, NAA ([facility])     Status: Abnormal   Collection Time: 11/03/19 10:27 AM   Specimen: Nasopharyngeal(NP) swabs in vial transport medium   NASOPHARYNGE  SCREENIN  Result Value Ref Range Status   SARS-CoV-2, NAA Detected (A) Not Detected Final    Comment: This nucleic acid amplification test was developed and its performance characteristics determined by [employer].  Nucleic acid amplification tests include PCR and TMA. This test has not been FDA cleared or approved. This test has been authorized by FDA under an Emergency  Use Authorization (EUA). This test is only authorized for the duration of time the declaration that circumstances exist justifying the authorization of the emergency use of in vitro diagnostic tests for detection of SARS-CoV-2 virus and/or diagnosis of COVID-19 infection under section 564(b)(1) of the Act, 21 U.S.C. [id number]) (1), unless the authorization is terminated or revoked sooner. When diagnostic testing is negative, the possibility of a false negative result should be considered in the context of a patient's recent exposures and the presence of clinical signs and symptoms consistent with COVID-19. An individual without symptoms of COVID-19 and who is not shedding SARS-CoV-2 virus would  expect to have a negative (not detected) result in this assay.   Blood Culture (routine x 2)     Status: None   Collection Time: 11/07/19  4:10 PM   Specimen: BLOOD  Result Value Ref Range Status   Specimen Description BLOOD LEFT ANTECUBITAL  Final   Special Requests   Final    BOTTLES DRAWN AEROBIC AND ANAEROBIC Blood Culture adequate volume   Culture   Final    NO GROWTH 5 DAYS Performed at [facility], [address] [place], [place] [address]    Report Status 11/12/2019 FINAL  Final  Blood Culture (routine x 2)     Status: None   Collection Time: 11/07/19  4:10 PM   Specimen: BLOOD LEFT HAND  Result Value Ref Range Status   Specimen Description BLOOD LEFT HAND  Final   Special Requests   Final    BOTTLES DRAWN AEROBIC AND ANAEROBIC Blood Culture results may not be optimal due to an inadequate volume of blood received in culture bottles   Culture   Final    NO GROWTH 5 DAYS Performed at [facility], [place] [address] [place], [place] [address]    Report Status 11/12/2019 FINAL  Final    Labs: BNP (last 3 results) No results for input(s): BNP in the last 8760 hours. Basic Metabolic Panel: Recent Labs  Lab 11/08/19 0634 11/09/19 0355 11/10/19 0738 11/11/19 0500  11/12/19 0356  NA 140 143 141 139 140  K 3.5 3.4* 4.7 3.7 3.8  CL 106 110 108 107 108  CO2 23 23 26  21* 23  GLUCOSE 114* 108* 99 94 102*  BUN 13 18 17 21  24*  CREATININE 0.75 0.63 0.65 0.60 0.70  CALCIUM 8.1* 8.1* 8.4* 8.4* 8.7*  MG 1.8 1.9 2.0 1.9 2.0  PHOS 2.1* 3.2 2.7 3.2 3.8   Liver Function Tests: Recent Labs  Lab 11/08/19 0634 11/09/19 0355 11/10/19 0738 11/11/19 0500 11/12/19 0356  AST 34 30 29 27 29   ALT 19 16 18 22 26   ALKPHOS 41 36* 37* 37* 39  BILITOT 0.9 0.6 0.5 0.5 0.4  PROT 6.0* 5.9* 5.6* 6.0* 5.6*  ALBUMIN 3.1* 2.9* 2.8* 3.0* 2.8*   Recent Labs  Lab 11/07/19 1606  LIPASE 30   No results for input(s): AMMONIA in the last 168 hours. CBC: Recent Labs  Lab 11/08/19 0634 11/09/19 0355 11/10/19 0738 11/11/19 0500 11/12/19 0356  WBC 5.2 6.8 6.1 8.7 11.9*  NEUTROABS 4.1 4.4 3.9 6.1 8.8*  HGB 13.7 12.8 13.3 14.3 13.5  HCT 40.3 38.4 39.0 41.3 38.9  MCV 86.3 86.3 86.3 85.0 84.7  PLT 144* 160 186 202 243   Cardiac Enzymes:  No results for input(s): CKTOTAL, CKMB, CKMBINDEX, TROPONINI in the last 168 hours. BNP: Invalid input(s): POCBNP CBG: Recent Labs  Lab 11/07/19 1522  GLUCAP 132*   D-Dimer Recent Labs    11/11/19 0500 11/12/19 0356  DDIMER 0.66* 0.59*   Hgb A1c No results for input(s): HGBA1C in the last 72 hours. Lipid Profile No results for input(s): CHOL, HDL, LDLCALC, TRIG, CHOLHDL, LDLDIRECT in the last 72 hours. Thyroid function studies No results for input(s): TSH, T4TOTAL, T3FREE, THYROIDAB in the last 72 hours.  Invalid input(s): FREET3 Anemia work up Recent Labs    11/11/19 0500 11/12/19 0356  FERRITIN 86 71   Urinalysis    Component Value Date/Time   COLORURINE YELLOW 11/07/2019 [place] 11/07/2019 1606   LABSPEC 1.018 11/07/2019 1606   PHURINE 5.0 11/07/2019 1606   GLUCOSEU NEGATIVE 11/07/2019 1606   HGBUR NEGATIVE 11/07/2019 1606   BILIRUBINUR NEGATIVE 11/07/2019 1606   KETONESUR 20 (A)  11/07/2019 1606   PROTEINUR 100 (A) 11/07/2019 1606   UROBILINOGEN 0.2 08/25/2007 0835   NITRITE NEGATIVE 11/07/2019 1606   LEUKOCYTESUR NEGATIVE 11/07/2019 1606   Sepsis Labs Invalid input(s): PROCALCITONIN,  WBC,  LACTICIDVEN Microbiology Recent Results (from the past 240 hour(s))  Novel Coronavirus, NAA ([facility])     Status: Abnormal   Collection Time: 11/03/19 10:27 AM   Specimen: Nasopharyngeal(NP) swabs in vial transport medium   NASOPHARYNGE  SCREENIN  Result Value Ref Range Status   SARS-CoV-2, NAA Detected (A) Not Detected Final    Comment: This nucleic acid amplification test was developed and its performance characteristics determined by [employer], [employer] and [employer]. Nucleic acid amplification tests include PCR and TMA. This test has not been FDA cleared or approved. This test has been authorized by FDA under an Emergency Use Authorization (EUA). This test is only authorized for the duration of time the declaration that circumstances exist justifying the authorization of the emergency use of in vitro diagnostic tests for detection of SARS-CoV-2 virus and/or diagnosis of COVID-19 infection under section 564(b)(1) of the Act, 21 U.S.C. [id number]) (1), unless the authorization is terminated or revoked sooner. When diagnostic testing is negative, the possibility of a false negative result should be considered in the context of a patient's recent exposures and the presence of clinical signs and symptoms consistent with COVID-19. An individual without symptoms of COVID-19 and who is not shedding SARS-CoV-2 virus would  expect to have a negative (not detected) result in this assay.   Blood Culture (routine x 2)     Status: None   Collection Time: 11/07/19  4:10 PM   Specimen: BLOOD  Result Value Ref Range Status   Specimen Description BLOOD LEFT ANTECUBITAL  Final   Special Requests   Final    BOTTLES DRAWN AEROBIC AND ANAEROBIC Blood Culture adequate volume   Culture   Final     NO GROWTH 5 DAYS Performed at [facility], [address], [place], [place] [address]    Report Status 11/12/2019 FINAL  Final  Blood Culture (routine x 2)     Status: None   Collection Time: 11/07/19  4:10 PM   Specimen: BLOOD LEFT HAND  Result Value Ref Range Status   Specimen Description BLOOD LEFT HAND  Final   Special Requests   Final    BOTTLES DRAWN AEROBIC AND ANAEROBIC Blood Culture results may not be optimal due to an inadequate volume of blood received in culture bottles   Culture   Final  NO GROWTH 5 DAYS Performed at [facility], [place] [address], [place], [place] [address]    Report Status 11/12/2019 FINAL  Final   Time coordinating discharge: 35 minutes  SIGNED:  Kerney Elbe, DO [facility] 11/12/2019, 1:43 PM Pager is on May  If 7PM-7AM, please contact night-coverage [email or web address] Password [email or web address]

## 2019-11-12 NOTE — Progress Notes (Signed)
Report called to [employer], Therapist, sports at [employer]. All questions answered. IVs removed, no other concerns. PTAR planned for 1600.

## 2019-11-12 NOTE — TOC Progression Note (Signed)
Transition of Care [facility] - Progression Note    Patient Details  Name: Tammy Arnold MRN: [id number] Date of Birth: 1941/07/19  Transition of [facility]) CM/SW [place], LCSW Phone Number: 11/12/2019, 1:28 PM  Clinical Narrative:    CSW spoke with patient's spouse, Dr. Ulanda Edison, regarding PT recommendation of SNF placement. He states that was hoping patient could return home, but states that she was not using a walker prior to hospitalization. He reports hope that she will not need to stay very long as it will make her unhappy to not have visitors. CSW presented COVID SNF bed options. He requested [facility]. [facility] is able to offer a bed for today. MD aware. Spouse requesting PTAR for transportation as he is trying to stay in quarantine to be able to return to work.    Expected Discharge Plan: [place]    Expected Discharge Plan and Services Expected Discharge Plan: [place]       Living arrangements for the past 2 months: Single Family Home                                       Social Determinants of Health (SDOH) Interventions    Readmission Risk Interventions No flowsheet data found.

## 2019-11-13 DIAGNOSIS — I6789 Other cerebrovascular disease: Secondary | ICD-10-CM | POA: Diagnosis not present

## 2019-11-13 DIAGNOSIS — U071 COVID-19: Secondary | ICD-10-CM | POA: Diagnosis not present

## 2019-11-13 DIAGNOSIS — I10 Essential (primary) hypertension: Secondary | ICD-10-CM | POA: Diagnosis not present

## 2019-11-13 DIAGNOSIS — F0280 Dementia in other diseases classified elsewhere without behavioral disturbance: Secondary | ICD-10-CM | POA: Diagnosis not present

## 2019-11-13 DIAGNOSIS — R5381 Other malaise: Secondary | ICD-10-CM | POA: Diagnosis not present

## 2019-11-13 DIAGNOSIS — E46 Unspecified protein-calorie malnutrition: Secondary | ICD-10-CM | POA: Diagnosis not present

## 2019-11-13 DIAGNOSIS — E7849 Other hyperlipidemia: Secondary | ICD-10-CM | POA: Diagnosis not present

## 2019-11-13 DIAGNOSIS — G3189 Other specified degenerative diseases of nervous system: Secondary | ICD-10-CM | POA: Diagnosis not present

## 2019-11-13 DIAGNOSIS — R008 Other abnormalities of heart beat: Secondary | ICD-10-CM | POA: Diagnosis not present

## 2019-11-15 ENCOUNTER — Other Ambulatory Visit: Payer: Self-pay | Admitting: *Deleted

## 2019-11-15 NOTE — Patient Outreach (Signed)
Screened for potential [facility] Care Management needs as a benefit of  [employer].  Mrs. Jan is currently receiving skilled therapy at [facility].   Writer attended telephonic interdisciplinary team meeting to assess for disposition needs and transition plan for resident.   Will continue to follow for transition plans and potential [facility] Care Management needs for member.   Marthenia Rolling, MSN-Ed, RN,BSN [place] Acute Care Coordinator [phone] [facility]) [phone]  (Toll free office)

## 2019-11-20 DIAGNOSIS — L304 Erythema intertrigo: Secondary | ICD-10-CM | POA: Diagnosis not present

## 2019-11-20 DIAGNOSIS — N39498 Other specified urinary incontinence: Secondary | ICD-10-CM | POA: Diagnosis not present

## 2019-11-22 ENCOUNTER — Other Ambulatory Visit: Payer: Self-pay | Admitting: *Deleted

## 2019-11-22 DIAGNOSIS — I6789 Other cerebrovascular disease: Secondary | ICD-10-CM | POA: Diagnosis not present

## 2019-11-22 DIAGNOSIS — I10 Essential (primary) hypertension: Secondary | ICD-10-CM | POA: Diagnosis not present

## 2019-11-22 DIAGNOSIS — E7849 Other hyperlipidemia: Secondary | ICD-10-CM | POA: Diagnosis not present

## 2019-11-22 DIAGNOSIS — U071 COVID-19: Secondary | ICD-10-CM | POA: Diagnosis not present

## 2019-11-22 DIAGNOSIS — F0280 Dementia in other diseases classified elsewhere without behavioral disturbance: Secondary | ICD-10-CM | POA: Diagnosis not present

## 2019-11-22 NOTE — Patient Outreach (Signed)
Screened for potential [facility] Care Management needs as a benefit of  [employer].  Tammy Arnold is has been receiving skilled therapy at [facility].  Writer attended telephonic interdisciplinary team meeting to assess for disposition needs and transition plan for resident.   Facility reports member will transition to home today with husband and caregiver assistance. She will also have [place].  Tammy Arnold has medical history of COVID-19, HTN, TIA, HLD.  Will place referral to [facility] for ongoing care management services.   Marthenia Rolling, MSN-Ed, RN,BSN [place] Acute Care Coordinator [phone] [facility]) [phone]  (Toll free office)

## 2019-11-23 ENCOUNTER — Emergency Department (HOSPITAL_COMMUNITY)
Admission: EM | Admit: 2019-11-23 | Discharge: 2019-11-24 | Disposition: A | Discharge status: Home / Self Care | Payer: Medicare Other | Attending: Emergency Medicine | Admitting: Emergency Medicine

## 2019-11-23 ENCOUNTER — Other Ambulatory Visit: Payer: Self-pay

## 2019-11-23 ENCOUNTER — Encounter (HOSPITAL_COMMUNITY): Payer: Self-pay

## 2019-11-23 ENCOUNTER — Other Ambulatory Visit: Payer: Self-pay | Admitting: *Deleted

## 2019-11-23 DIAGNOSIS — Z8616 Personal history of COVID-19: Secondary | ICD-10-CM | POA: Diagnosis not present

## 2019-11-23 DIAGNOSIS — R1084 Generalized abdominal pain: Secondary | ICD-10-CM | POA: Diagnosis not present

## 2019-11-23 DIAGNOSIS — R339 Retention of urine, unspecified: Secondary | ICD-10-CM | POA: Insufficient documentation

## 2019-11-23 DIAGNOSIS — Z79899 Other long term (current) drug therapy: Secondary | ICD-10-CM | POA: Diagnosis not present

## 2019-11-23 DIAGNOSIS — R52 Pain, unspecified: Secondary | ICD-10-CM | POA: Diagnosis not present

## 2019-11-23 DIAGNOSIS — I10 Essential (primary) hypertension: Secondary | ICD-10-CM | POA: Diagnosis not present

## 2019-11-23 DIAGNOSIS — F0390 Unspecified dementia without behavioral disturbance: Secondary | ICD-10-CM | POA: Insufficient documentation

## 2019-11-23 DIAGNOSIS — R109 Unspecified abdominal pain: Secondary | ICD-10-CM | POA: Diagnosis present

## 2019-11-23 DIAGNOSIS — R140 Abdominal distension (gaseous): Secondary | ICD-10-CM | POA: Diagnosis not present

## 2019-11-23 DIAGNOSIS — M5416 Radiculopathy, lumbar region: Secondary | ICD-10-CM | POA: Diagnosis not present

## 2019-11-23 NOTE — Patient Outreach (Signed)
[facility]) Care Management  11/23/2019  Tammy Arnold [birth year] [id number]   Referral received from post acute care coordinator as member was recently admitted to hospital with encephalopathy and Covid 19 infection, discharged to SNF.  Was discharged from SNF yesterday.  Per chart, she also has history dementia, hypertension, TIA, and hyperlipidemia.  Notified that member discharged home with husband.  Call placed to husband/caregiver as member has dementia, female answering phone state he is not available.  Contact information left, request husband to call back when he's available.  Will send outreach letter and follow up within the next 3-4 business days.  Valente David, [place], MSN [place] [phone]

## 2019-11-23 NOTE — ED Triage Notes (Signed)
Pt BIB EMS from home. Pts husband is a physician and believes she is having urinary retention due to abdominal distention and and pain since since yesterday. She was discharged from [facility] yesterday. Pt was recently admitted for COVID. Pt has hx of dementia. Pt normally uses wheelchair at home.   HR 68 RR 22 99% RA Temp 98.1

## 2019-11-23 NOTE — ED Provider Notes (Signed)
[facility] DEPT Provider Note   CSN: [id number] Arrival date & time: 11/23/19  2142     History Chief Complaint  Patient presents with  . Abdominal Pain   Level 5 caveat due to dementia Tammy Arnold is a 79 y.o. female.  The history is provided by the patient and the spouse.  Patient presents with suspected abdominal pain urinary retention.  Patient has a history of dementia which limits the history.  Husband provides most of the history.  Husband reports over the past day patient has reported pain but is unable to localize it.  He then noted that she had some lower abdominal distention and is concerned that she is not urinating. No fevers or vomiting. No other acute change in her mental status.  Patient was recently hospitalized for COVID-19.  Patient had generalized weakness and urinary and fecal incontinence started with a Covid infection. She continues to have incontinence issues.   Husband reports the patient does have history of spinal stenosis and has recently received pain medications, but he was unsure if the pain is coming from her back     Past Medical History:  Diagnosis Date  . Arthritis   . High cholesterol   . Hypertension   . Stroke [facility])     Patient Active Problem List   Diagnosis Date Noted  . COVID-19 virus infection 11/07/2019  . Leukocytopenia 11/07/2019  . Back pain 12/27/2017  . Small vessel disease, cerebrovascular   . 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 Ataxia 12/05/2017  . Cognitive impairment   . Abnormal MRI   . Blurry vision   . Hypokalemia   . Acute blood loss anemia   . TIA (transient ischemic attack) 12/04/2017  . Essential hypertension 12/04/2017  . HLD (hyperlipidemia) 12/04/2017    Past Surgical History:  Procedure Laterality Date  . CATARACT EXTRACTION  11/23/2017  . REPLACEMENT TOTAL KNEE       OB History   No obstetric history on file.     Family History  Problem Relation Age of  Onset  . Dementia Mother     Social History   Tobacco Use  . Smoking status: Never Smoker  . Smokeless tobacco: Never Used  . Tobacco comment: smoked very little in college none since  Substance Use Topics  . Alcohol use: No    Alcohol/week: 0.0 standard drinks  . Drug use: Not Currently    Home Medications Prior to Admission medications   Medication Sig Start Date End Date Taking? Authorizing Provider  acetaminophen (TYLENOL) 325 MG tablet Take 2 tablets (650 mg total) by mouth every 6 (six) hours as needed for mild pain (or Fever >/= 101). 11/12/19   Raiford Noble Latif, DO  albuterol (VENTOLIN HFA) 108 (90 Base) MCG/ACT inhaler Inhale 1-2 puffs into the lungs every 4 (four) hours as needed for wheezing or shortness of breath. 11/12/19   Raiford Noble Latif, DO  ascorbic acid (VITAMIN C) 500 MG tablet Take 1 tablet (500 mg total) by mouth daily. 11/13/19   Raiford Noble Latif, DO  feeding supplement, ENSURE ENLIVE, (ENSURE ENLIVE) LIQD Take 237 mLs by mouth 3 (three) times daily between meals. 11/12/19   Sheikh, Omair Latif, DO  guaiFENesin-dextromethorphan (ROBITUSSIN DM) 100-10 MG/5ML syrup Take 10 mLs by mouth every 4 (four) hours as needed for cough. 11/12/19   Raiford Noble Latif, DO  lisinopril (PRINIVIL,ZESTRIL) 2.5 MG tablet Take 1 tablet (2.5 mg total) by mouth daily. 12/28/17   Reesa Chew  S, PA-C  memantine (NAMENDA) 10 MG tablet Take 1 tablet (10 mg total) by mouth 2 (two) times daily. 07/17/19   Garvin Fila, MD  pravastatin (PRAVACHOL) 20 MG tablet Take 1 tablet (20 mg total) by mouth daily at 6 PM. 12/07/17   Geradine Girt, DO  zinc sulfate 220 (50 Zn) MG capsule Take 1 capsule (220 mg total) by mouth daily. 11/13/19   Raiford Noble Latif, DO    Allergies    Baclofen, Keflex [cephalexin], Macrodantin [nitrofurantoin], Neggram [nalidixic acid], Nitrofurantoin monohyd macro, Penicillins, and Sulfa antibiotics  Review of Systems   Review of Systems  Unable to perform ROS:  Dementia    Physical Exam Updated Vital Signs BP (!) 143/77   Pulse 76   Temp 98 F (36.7 C) (Oral)   Resp 14   SpO2 100%   Physical Exam CONSTITUTIONAL: Elderly, no acute distress HEAD: Normocephalic/atraumatic EYES: EOMI/PERRL ENMT: Mucous membranes moist NECK: supple no meningeal signs SPINE/BACK:entire spine nontender CV: S1/S2 noted, no murmurs/rubs/gallops noted LUNGS: Lungs are clear to auscultation bilaterally, no apparent distress ABDOMEN: soft, lower abdominal distention noted.  Tenderness noted in that area.  No other focal abdominal tenderness NEURO: Pt is awake/alert moves all extremitiesx4.  No facial droop.  Patient appears confused but answers questions and follows commands.  She is able to move all extremities EXTREMITIES: pulses normal/equal, full ROM SKIN: warm, color normal PSYCH: Unable to assess  ED Results / Procedures / Treatments   Labs (all labs ordered are listed, but only abnormal results are displayed) Labs Reviewed  URINE CULTURE  URINALYSIS, ROUTINE W REFLEX MICROSCOPIC    EKG None  Radiology No results found.  Procedures Procedures   Medications Ordered in ED Medications - No data to display  ED Course  I have reviewed the triage vital signs and the nursing notes.  Pertinent labs  results that were available during my care of the patient were reviewed by me and considered in my medical decision making (see chart for details).    MDM Rules/Calculators/A&P                      11:49 PM Patient with recent hospitalization for COVID-19 has had incontinence since that time.  She just got home from rehab facility in the past 24 hours.  Husband reports the patient was reporting pain that was difficult to localize Husband is a gynecologist, and he felt that patient's lower abdomen appeared distended and is concerned about urinary retention. She had over 500 mL of urine on bladder scan.  Foley catheter is ordered Reportedly has a  history of spinal stenosis, MRI LUMBAR SPINE from 2017 did not reveal any significant malalignment. 1:20 AM Patient had over 1000 mL of urine on Foley insertion Patient feels much improved.  Her abdominal distention is improved and no tenderness. Husband feels she is back to baseline and would like to be discharged. She does have difficulty walking due to back pain. However there is no new weakness.  She is able to move all extremities x4. A further detailed neurologic is difficult due to history of dementia.  However given her history, I have low suspicion for cauda equina as cause of her urinary retention.  She has been having both fecal and urinary incontinence for several weeks since her diagnosis of Covid, so it is unlikely this represents an acute neurologic event Patient will be discharged with a Foley catheter in place and will follow with urology  next week.  Husband is comfortable with this plan Final Clinical Impression(s) / ED Diagnoses Final diagnoses:  Urinary retention    Rx / DC Orders ED Discharge Orders    None       Ripley Fraise, MD 11/24/19 [phone]

## 2019-11-24 DIAGNOSIS — G9340 Encephalopathy, unspecified: Secondary | ICD-10-CM | POA: Diagnosis not present

## 2019-11-24 DIAGNOSIS — Z8673 Personal history of transient ischemic attack (TIA), and cerebral infarction without residual deficits: Secondary | ICD-10-CM | POA: Diagnosis not present

## 2019-11-24 DIAGNOSIS — M4800 Spinal stenosis, site unspecified: Secondary | ICD-10-CM | POA: Diagnosis not present

## 2019-11-24 DIAGNOSIS — D72829 Elevated white blood cell count, unspecified: Secondary | ICD-10-CM | POA: Diagnosis not present

## 2019-11-24 DIAGNOSIS — R159 Full incontinence of feces: Secondary | ICD-10-CM | POA: Diagnosis not present

## 2019-11-24 DIAGNOSIS — E876 Hypokalemia: Secondary | ICD-10-CM | POA: Diagnosis not present

## 2019-11-24 DIAGNOSIS — R001 Bradycardia, unspecified: Secondary | ICD-10-CM | POA: Diagnosis not present

## 2019-11-24 DIAGNOSIS — F0390 Unspecified dementia without behavioral disturbance: Secondary | ICD-10-CM | POA: Diagnosis not present

## 2019-11-24 DIAGNOSIS — D696 Thrombocytopenia, unspecified: Secondary | ICD-10-CM | POA: Diagnosis not present

## 2019-11-24 DIAGNOSIS — M419 Scoliosis, unspecified: Secondary | ICD-10-CM | POA: Diagnosis not present

## 2019-11-24 DIAGNOSIS — Z466 Encounter for fitting and adjustment of urinary device: Secondary | ICD-10-CM | POA: Diagnosis not present

## 2019-11-24 DIAGNOSIS — E785 Hyperlipidemia, unspecified: Secondary | ICD-10-CM | POA: Diagnosis not present

## 2019-11-24 DIAGNOSIS — I10 Essential (primary) hypertension: Secondary | ICD-10-CM | POA: Diagnosis not present

## 2019-11-24 DIAGNOSIS — M1990 Unspecified osteoarthritis, unspecified site: Secondary | ICD-10-CM | POA: Diagnosis not present

## 2019-11-24 DIAGNOSIS — U071 COVID-19: Secondary | ICD-10-CM | POA: Diagnosis not present

## 2019-11-24 DIAGNOSIS — R32 Unspecified urinary incontinence: Secondary | ICD-10-CM | POA: Diagnosis not present

## 2019-11-24 LAB — URINALYSIS, ROUTINE W REFLEX MICROSCOPIC
Bilirubin Urine: NEGATIVE
Glucose, UA: NEGATIVE mg/dL
Hgb urine dipstick: NEGATIVE
Ketones, ur: NEGATIVE mg/dL
Leukocytes,Ua: NEGATIVE
Nitrite: NEGATIVE
Protein, ur: NEGATIVE mg/dL
Specific Gravity, Urine: 1.018 (ref 1.005–1.030)
pH: 5 (ref 5.0–8.0)

## 2019-11-24 NOTE — ED Notes (Signed)
Pt unable to sign at d/c. Husband is picking her up to get home. He stated that he did not want to wait on PTAR.

## 2019-11-24 NOTE — ED Notes (Signed)
PTAR called for pt transport home.  

## 2019-11-24 NOTE — ED Notes (Signed)
Pts husband returned to pick up PT, he was unable to wait for Pt return via [facility]

## 2019-11-24 NOTE — ED Notes (Signed)
This Probation officer and [place], NT attempted to place a foley catheter. Pt was cleaned extensively prior due to pt having a small bowel movement. While prepping/cleaning the site, pt's husband continuously interfered and tried to give advice to staff.  While attempting to insert the catheter, pt's husband put gloves on and began touching pt's vagina while telling staff members "It's easier to see the urethra if you lift the labia up like this." Asencion Partridge, NT informed the pt that this is a sterile procedure. Pt husband stated "I am not going to interfere with your sterile field." This writer did not feel comfortable proceeding. Maylon Cos, Agricultural consultant was made aware. Asencion Partridge, NT attempted to insert.

## 2019-11-24 NOTE — ED Notes (Signed)
Pt unable to sign for discharge. Pt assisted to vehicle, husband waiting outside.

## 2019-11-24 NOTE — ED Notes (Signed)
Pt not steady on feet when attempted to ambulate, PTAR necessary for pt safe transport home.

## 2019-11-25 LAB — URINE CULTURE: Culture: NO GROWTH

## 2019-11-29 ENCOUNTER — Other Ambulatory Visit: Payer: Self-pay | Admitting: *Deleted

## 2019-11-29 DIAGNOSIS — F0390 Unspecified dementia without behavioral disturbance: Secondary | ICD-10-CM | POA: Diagnosis not present

## 2019-11-29 DIAGNOSIS — G9340 Encephalopathy, unspecified: Secondary | ICD-10-CM | POA: Diagnosis not present

## 2019-11-29 DIAGNOSIS — Z466 Encounter for fitting and adjustment of urinary device: Secondary | ICD-10-CM | POA: Diagnosis not present

## 2019-11-29 DIAGNOSIS — U071 COVID-19: Secondary | ICD-10-CM | POA: Diagnosis not present

## 2019-11-29 DIAGNOSIS — I10 Essential (primary) hypertension: Secondary | ICD-10-CM | POA: Diagnosis not present

## 2019-11-29 DIAGNOSIS — M1990 Unspecified osteoarthritis, unspecified site: Secondary | ICD-10-CM | POA: Diagnosis not present

## 2019-11-29 NOTE — Patient Outreach (Signed)
[facility]) Care Management  11/29/2019  REYA SCHWEDER April 03, 1941 [id number]   Outreach attempt #2, successful to husband however state this is not a good time to talk and will call back later.   Update:  Call received back from husband, member's identity verified.  This care manager introduced self and stated purpose of call.  [facility] care management services explained.  Member lives in the home with him, report having supervision 24/7.  They have family as well as CNA's from a private agency that are in the home caring for the member while he is working.  She has home health through [facility], initial nursing assessment was complete, will have first PT session tomorrow.  She also has OT ordered.  She was seen in the ED last week for urinary retention, had foley placed which remains in place.  She has appointment scheduled with PCP next week but he state he will cancel due to difficulty getting her out of the home (she is currently not ambulatory).  Advised that office could do virtual visit instead, he state he does not feel one is needed at this time.    Although member qualifies for program, husband feels they have all that is needed to manage member's care.  State he is a physician and able to take care of her medical conditions, already has increased support in the home, which was his main concern upon discharge.  Benefits of [facility] explained, he declines but state is open to contacting this care manager if needs should change.  Will send successful outreach letter but will not open case at this time.  Valente David, [place], MSN [place] [phone]

## 2019-11-30 DIAGNOSIS — Z466 Encounter for fitting and adjustment of urinary device: Secondary | ICD-10-CM | POA: Diagnosis not present

## 2019-11-30 DIAGNOSIS — U071 COVID-19: Secondary | ICD-10-CM | POA: Diagnosis not present

## 2019-11-30 DIAGNOSIS — F0390 Unspecified dementia without behavioral disturbance: Secondary | ICD-10-CM | POA: Diagnosis not present

## 2019-11-30 DIAGNOSIS — I10 Essential (primary) hypertension: Secondary | ICD-10-CM | POA: Diagnosis not present

## 2019-11-30 DIAGNOSIS — M1990 Unspecified osteoarthritis, unspecified site: Secondary | ICD-10-CM | POA: Diagnosis not present

## 2019-11-30 DIAGNOSIS — G9340 Encephalopathy, unspecified: Secondary | ICD-10-CM | POA: Diagnosis not present

## 2019-12-01 DIAGNOSIS — G9340 Encephalopathy, unspecified: Secondary | ICD-10-CM | POA: Diagnosis not present

## 2019-12-01 DIAGNOSIS — Z466 Encounter for fitting and adjustment of urinary device: Secondary | ICD-10-CM | POA: Diagnosis not present

## 2019-12-01 DIAGNOSIS — I10 Essential (primary) hypertension: Secondary | ICD-10-CM | POA: Diagnosis not present

## 2019-12-01 DIAGNOSIS — U071 COVID-19: Secondary | ICD-10-CM | POA: Diagnosis not present

## 2019-12-01 DIAGNOSIS — F0390 Unspecified dementia without behavioral disturbance: Secondary | ICD-10-CM | POA: Diagnosis not present

## 2019-12-01 DIAGNOSIS — M1990 Unspecified osteoarthritis, unspecified site: Secondary | ICD-10-CM | POA: Diagnosis not present

## 2019-12-04 DIAGNOSIS — I10 Essential (primary) hypertension: Secondary | ICD-10-CM | POA: Diagnosis not present

## 2019-12-04 DIAGNOSIS — Z466 Encounter for fitting and adjustment of urinary device: Secondary | ICD-10-CM | POA: Diagnosis not present

## 2019-12-04 DIAGNOSIS — M1990 Unspecified osteoarthritis, unspecified site: Secondary | ICD-10-CM | POA: Diagnosis not present

## 2019-12-04 DIAGNOSIS — F0390 Unspecified dementia without behavioral disturbance: Secondary | ICD-10-CM | POA: Diagnosis not present

## 2019-12-04 DIAGNOSIS — U071 COVID-19: Secondary | ICD-10-CM | POA: Diagnosis not present

## 2019-12-04 DIAGNOSIS — G9340 Encephalopathy, unspecified: Secondary | ICD-10-CM | POA: Diagnosis not present

## 2019-12-06 DIAGNOSIS — F0390 Unspecified dementia without behavioral disturbance: Secondary | ICD-10-CM | POA: Diagnosis not present

## 2019-12-06 DIAGNOSIS — G9340 Encephalopathy, unspecified: Secondary | ICD-10-CM | POA: Diagnosis not present

## 2019-12-06 DIAGNOSIS — M1990 Unspecified osteoarthritis, unspecified site: Secondary | ICD-10-CM | POA: Diagnosis not present

## 2019-12-06 DIAGNOSIS — Z466 Encounter for fitting and adjustment of urinary device: Secondary | ICD-10-CM | POA: Diagnosis not present

## 2019-12-06 DIAGNOSIS — I10 Essential (primary) hypertension: Secondary | ICD-10-CM | POA: Diagnosis not present

## 2019-12-06 DIAGNOSIS — U071 COVID-19: Secondary | ICD-10-CM | POA: Diagnosis not present

## 2019-12-07 DIAGNOSIS — Z466 Encounter for fitting and adjustment of urinary device: Secondary | ICD-10-CM | POA: Diagnosis not present

## 2019-12-07 DIAGNOSIS — F0390 Unspecified dementia without behavioral disturbance: Secondary | ICD-10-CM | POA: Diagnosis not present

## 2019-12-07 DIAGNOSIS — U071 COVID-19: Secondary | ICD-10-CM | POA: Diagnosis not present

## 2019-12-07 DIAGNOSIS — G9340 Encephalopathy, unspecified: Secondary | ICD-10-CM | POA: Diagnosis not present

## 2019-12-07 DIAGNOSIS — M1990 Unspecified osteoarthritis, unspecified site: Secondary | ICD-10-CM | POA: Diagnosis not present

## 2019-12-07 DIAGNOSIS — I10 Essential (primary) hypertension: Secondary | ICD-10-CM | POA: Diagnosis not present

## 2019-12-11 DIAGNOSIS — F0390 Unspecified dementia without behavioral disturbance: Secondary | ICD-10-CM | POA: Diagnosis not present

## 2019-12-11 DIAGNOSIS — G9340 Encephalopathy, unspecified: Secondary | ICD-10-CM | POA: Diagnosis not present

## 2019-12-11 DIAGNOSIS — I10 Essential (primary) hypertension: Secondary | ICD-10-CM | POA: Diagnosis not present

## 2019-12-11 DIAGNOSIS — Z466 Encounter for fitting and adjustment of urinary device: Secondary | ICD-10-CM | POA: Diagnosis not present

## 2019-12-11 DIAGNOSIS — M1990 Unspecified osteoarthritis, unspecified site: Secondary | ICD-10-CM | POA: Diagnosis not present

## 2019-12-11 DIAGNOSIS — U071 COVID-19: Secondary | ICD-10-CM | POA: Diagnosis not present

## 2019-12-13 DIAGNOSIS — M1990 Unspecified osteoarthritis, unspecified site: Secondary | ICD-10-CM | POA: Diagnosis not present

## 2019-12-13 DIAGNOSIS — F0390 Unspecified dementia without behavioral disturbance: Secondary | ICD-10-CM | POA: Diagnosis not present

## 2019-12-13 DIAGNOSIS — G9340 Encephalopathy, unspecified: Secondary | ICD-10-CM | POA: Diagnosis not present

## 2019-12-13 DIAGNOSIS — I10 Essential (primary) hypertension: Secondary | ICD-10-CM | POA: Diagnosis not present

## 2019-12-13 DIAGNOSIS — Z466 Encounter for fitting and adjustment of urinary device: Secondary | ICD-10-CM | POA: Diagnosis not present

## 2019-12-13 DIAGNOSIS — U071 COVID-19: Secondary | ICD-10-CM | POA: Diagnosis not present

## 2019-12-20 DIAGNOSIS — M1990 Unspecified osteoarthritis, unspecified site: Secondary | ICD-10-CM | POA: Diagnosis not present

## 2019-12-20 DIAGNOSIS — Z466 Encounter for fitting and adjustment of urinary device: Secondary | ICD-10-CM | POA: Diagnosis not present

## 2019-12-20 DIAGNOSIS — G9340 Encephalopathy, unspecified: Secondary | ICD-10-CM | POA: Diagnosis not present

## 2019-12-20 DIAGNOSIS — U071 COVID-19: Secondary | ICD-10-CM | POA: Diagnosis not present

## 2019-12-20 DIAGNOSIS — F0390 Unspecified dementia without behavioral disturbance: Secondary | ICD-10-CM | POA: Diagnosis not present

## 2019-12-20 DIAGNOSIS — I10 Essential (primary) hypertension: Secondary | ICD-10-CM | POA: Diagnosis not present

## 2019-12-24 DIAGNOSIS — E876 Hypokalemia: Secondary | ICD-10-CM | POA: Diagnosis not present

## 2019-12-24 DIAGNOSIS — E785 Hyperlipidemia, unspecified: Secondary | ICD-10-CM | POA: Diagnosis not present

## 2019-12-24 DIAGNOSIS — M1990 Unspecified osteoarthritis, unspecified site: Secondary | ICD-10-CM | POA: Diagnosis not present

## 2019-12-24 DIAGNOSIS — R159 Full incontinence of feces: Secondary | ICD-10-CM | POA: Diagnosis not present

## 2019-12-24 DIAGNOSIS — D696 Thrombocytopenia, unspecified: Secondary | ICD-10-CM | POA: Diagnosis not present

## 2019-12-24 DIAGNOSIS — D72829 Elevated white blood cell count, unspecified: Secondary | ICD-10-CM | POA: Diagnosis not present

## 2019-12-24 DIAGNOSIS — F0390 Unspecified dementia without behavioral disturbance: Secondary | ICD-10-CM | POA: Diagnosis not present

## 2019-12-24 DIAGNOSIS — Z8673 Personal history of transient ischemic attack (TIA), and cerebral infarction without residual deficits: Secondary | ICD-10-CM | POA: Diagnosis not present

## 2019-12-24 DIAGNOSIS — I10 Essential (primary) hypertension: Secondary | ICD-10-CM | POA: Diagnosis not present

## 2019-12-24 DIAGNOSIS — R001 Bradycardia, unspecified: Secondary | ICD-10-CM | POA: Diagnosis not present

## 2019-12-24 DIAGNOSIS — M4800 Spinal stenosis, site unspecified: Secondary | ICD-10-CM | POA: Diagnosis not present

## 2019-12-24 DIAGNOSIS — G9340 Encephalopathy, unspecified: Secondary | ICD-10-CM | POA: Diagnosis not present

## 2019-12-24 DIAGNOSIS — R32 Unspecified urinary incontinence: Secondary | ICD-10-CM | POA: Diagnosis not present

## 2019-12-24 DIAGNOSIS — Z466 Encounter for fitting and adjustment of urinary device: Secondary | ICD-10-CM | POA: Diagnosis not present

## 2019-12-24 DIAGNOSIS — U071 COVID-19: Secondary | ICD-10-CM | POA: Diagnosis not present

## 2019-12-24 DIAGNOSIS — M419 Scoliosis, unspecified: Secondary | ICD-10-CM | POA: Diagnosis not present

## 2019-12-25 DIAGNOSIS — U071 COVID-19: Secondary | ICD-10-CM | POA: Diagnosis not present

## 2019-12-25 DIAGNOSIS — M1990 Unspecified osteoarthritis, unspecified site: Secondary | ICD-10-CM | POA: Diagnosis not present

## 2019-12-25 DIAGNOSIS — F0390 Unspecified dementia without behavioral disturbance: Secondary | ICD-10-CM | POA: Diagnosis not present

## 2019-12-25 DIAGNOSIS — I10 Essential (primary) hypertension: Secondary | ICD-10-CM | POA: Diagnosis not present

## 2019-12-25 DIAGNOSIS — G9340 Encephalopathy, unspecified: Secondary | ICD-10-CM | POA: Diagnosis not present

## 2019-12-25 DIAGNOSIS — Z466 Encounter for fitting and adjustment of urinary device: Secondary | ICD-10-CM | POA: Diagnosis not present

## 2019-12-27 ENCOUNTER — Telehealth: Payer: Self-pay | Admitting: *Deleted

## 2019-12-27 ENCOUNTER — Emergency Department (HOSPITAL_COMMUNITY): Payer: Medicare Other

## 2019-12-27 ENCOUNTER — Other Ambulatory Visit: Payer: Self-pay

## 2019-12-27 ENCOUNTER — Inpatient Hospital Stay (HOSPITAL_COMMUNITY)
Admission: EM | Admit: 2019-12-27 | Discharge: 2019-12-31 | DRG: 698 | Disposition: A | Discharge status: Home Health | Payer: Medicare Other | Attending: Family Medicine | Admitting: Family Medicine

## 2019-12-27 DIAGNOSIS — Z881 Allergy status to other antibiotic agents status: Secondary | ICD-10-CM

## 2019-12-27 DIAGNOSIS — M2550 Pain in unspecified joint: Secondary | ICD-10-CM | POA: Diagnosis not present

## 2019-12-27 DIAGNOSIS — R338 Other retention of urine: Secondary | ICD-10-CM | POA: Diagnosis not present

## 2019-12-27 DIAGNOSIS — I10 Essential (primary) hypertension: Secondary | ICD-10-CM | POA: Diagnosis present

## 2019-12-27 DIAGNOSIS — Z8701 Personal history of pneumonia (recurrent): Secondary | ICD-10-CM | POA: Diagnosis not present

## 2019-12-27 DIAGNOSIS — N390 Urinary tract infection, site not specified: Secondary | ICD-10-CM | POA: Diagnosis present

## 2019-12-27 DIAGNOSIS — I679 Cerebrovascular disease, unspecified: Secondary | ICD-10-CM | POA: Diagnosis present

## 2019-12-27 DIAGNOSIS — D638 Anemia in other chronic diseases classified elsewhere: Secondary | ICD-10-CM | POA: Diagnosis present

## 2019-12-27 DIAGNOSIS — Z96659 Presence of unspecified artificial knee joint: Secondary | ICD-10-CM | POA: Diagnosis present

## 2019-12-27 DIAGNOSIS — R000 Tachycardia, unspecified: Secondary | ICD-10-CM | POA: Diagnosis not present

## 2019-12-27 DIAGNOSIS — M4800 Spinal stenosis, site unspecified: Secondary | ICD-10-CM | POA: Diagnosis present

## 2019-12-27 DIAGNOSIS — T83511A Infection and inflammatory reaction due to indwelling urethral catheter, initial encounter: Principal | ICD-10-CM | POA: Diagnosis present

## 2019-12-27 DIAGNOSIS — Z880 Allergy status to penicillin: Secondary | ICD-10-CM | POA: Diagnosis not present

## 2019-12-27 DIAGNOSIS — Z03818 Encounter for observation for suspected exposure to other biological agents ruled out: Secondary | ICD-10-CM | POA: Diagnosis not present

## 2019-12-27 DIAGNOSIS — Z978 Presence of other specified devices: Secondary | ICD-10-CM | POA: Diagnosis not present

## 2019-12-27 DIAGNOSIS — I6789 Other cerebrovascular disease: Secondary | ICD-10-CM | POA: Diagnosis present

## 2019-12-27 DIAGNOSIS — Y846 Urinary catheterization as the cause of abnormal reaction of the patient, or of later complication, without mention of misadventure at the time of the procedure: Secondary | ICD-10-CM | POA: Diagnosis present

## 2019-12-27 DIAGNOSIS — Z8616 Personal history of COVID-19: Secondary | ICD-10-CM

## 2019-12-27 DIAGNOSIS — Z8673 Personal history of transient ischemic attack (TIA), and cerebral infarction without residual deficits: Secondary | ICD-10-CM | POA: Diagnosis not present

## 2019-12-27 DIAGNOSIS — E785 Hyperlipidemia, unspecified: Secondary | ICD-10-CM | POA: Diagnosis present

## 2019-12-27 DIAGNOSIS — F0280 Dementia in other diseases classified elsewhere without behavioral disturbance: Secondary | ICD-10-CM | POA: Diagnosis present

## 2019-12-27 DIAGNOSIS — G309 Alzheimer's disease, unspecified: Secondary | ICD-10-CM | POA: Diagnosis present

## 2019-12-27 DIAGNOSIS — R4182 Altered mental status, unspecified: Secondary | ICD-10-CM

## 2019-12-27 DIAGNOSIS — G8929 Other chronic pain: Secondary | ICD-10-CM | POA: Diagnosis present

## 2019-12-27 DIAGNOSIS — R4189 Other symptoms and signs involving cognitive functions and awareness: Secondary | ICD-10-CM | POA: Diagnosis present

## 2019-12-27 DIAGNOSIS — M48061 Spinal stenosis, lumbar region without neurogenic claudication: Secondary | ICD-10-CM

## 2019-12-27 DIAGNOSIS — G9341 Metabolic encephalopathy: Secondary | ICD-10-CM | POA: Diagnosis present

## 2019-12-27 DIAGNOSIS — Z515 Encounter for palliative care: Secondary | ICD-10-CM | POA: Diagnosis not present

## 2019-12-27 DIAGNOSIS — R404 Transient alteration of awareness: Secondary | ICD-10-CM | POA: Diagnosis not present

## 2019-12-27 DIAGNOSIS — R410 Disorientation, unspecified: Secondary | ICD-10-CM | POA: Diagnosis not present

## 2019-12-27 DIAGNOSIS — B952 Enterococcus as the cause of diseases classified elsewhere: Secondary | ICD-10-CM | POA: Diagnosis present

## 2019-12-27 DIAGNOSIS — Z7401 Bed confinement status: Secondary | ICD-10-CM | POA: Diagnosis not present

## 2019-12-27 LAB — URINALYSIS, ROUTINE W REFLEX MICROSCOPIC
Bilirubin Urine: NEGATIVE
Glucose, UA: NEGATIVE mg/dL
Hgb urine dipstick: NEGATIVE
Ketones, ur: NEGATIVE mg/dL
Nitrite: POSITIVE — AB
Protein, ur: 100 mg/dL — AB
Specific Gravity, Urine: 1.024 (ref 1.005–1.030)
WBC, UA: >50 WBC/hpf — ABNORMAL HIGH (ref 0–5)
pH: 6 (ref 5.0–8.0)

## 2019-12-27 LAB — CBC WITH DIFFERENTIAL/PLATELET
Abs Immature Granulocytes: 0.13 10*3/uL — ABNORMAL HIGH (ref 0.00–0.07)
Basophils Absolute: 0 10*3/uL (ref 0.0–0.1)
Basophils Relative: 0 %
Eosinophils Absolute: 0 10*3/uL (ref 0.0–0.5)
Eosinophils Relative: 0 %
HCT: 35.6 % — ABNORMAL LOW (ref 36.0–46.0)
Hemoglobin: 11.4 g/dL — ABNORMAL LOW (ref 12.0–15.0)
Immature Granulocytes: 1 %
Lymphocytes Relative: 24 %
Lymphs Abs: 3 10*3/uL (ref 0.7–4.0)
MCH: 28.8 pg (ref 26.0–34.0)
MCHC: 32 g/dL (ref 30.0–36.0)
MCV: 89.9 fL (ref 80.0–100.0)
Monocytes Absolute: 0.9 10*3/uL (ref 0.1–1.0)
Monocytes Relative: 7 %
Neutro Abs: 8.6 10*3/uL — ABNORMAL HIGH (ref 1.7–7.7)
Neutrophils Relative %: 68 %
Platelets: 344 10*3/uL (ref 150–400)
RBC: 3.96 MIL/uL (ref 3.87–5.11)
RDW: 13 % (ref 11.5–15.5)
WBC: 12.7 10*3/uL — ABNORMAL HIGH (ref 4.0–10.5)
nRBC: 0 % (ref 0.0–0.2)

## 2019-12-27 LAB — COMPREHENSIVE METABOLIC PANEL
ALT: 15 U/L (ref 0–44)
AST: 23 U/L (ref 15–41)
Albumin: 2.9 g/dL — ABNORMAL LOW (ref 3.5–5.0)
Alkaline Phosphatase: 58 U/L (ref 38–126)
Anion gap: 10 (ref 5–15)
BUN: 14 mg/dL (ref 8–23)
CO2: 21 mmol/L — ABNORMAL LOW (ref 22–32)
Calcium: 8.6 mg/dL — ABNORMAL LOW (ref 8.9–10.3)
Chloride: 105 mmol/L (ref 98–111)
Creatinine, Ser: 0.56 mg/dL (ref 0.44–1.00)
GFR calc Af Amer: >60 mL/min (ref >60)
GFR calc non Af Amer: >60 mL/min (ref >60)
Glucose, Bld: 116 mg/dL — ABNORMAL HIGH (ref 70–99)
Potassium: 3.8 mmol/L (ref 3.5–5.1)
Sodium: 136 mmol/L (ref 135–145)
Total Bilirubin: 0.8 mg/dL (ref 0.3–1.2)
Total Protein: 7 g/dL (ref 6.5–8.1)

## 2019-12-27 LAB — LACTIC ACID, PLASMA: Lactic Acid, Venous: 1 mmol/L (ref 0.5–1.9)

## 2019-12-27 LAB — SARS CORONAVIRUS 2 (TAT 6-24 HRS): SARS Coronavirus 2: NEGATIVE

## 2019-12-27 MED ORDER — ENSURE ENLIVE PO LIQD
237.0000 mL | Freq: Three times a day (TID) | ORAL | Status: DC
  Administered 2019-12-27 – 2019-12-31 (×9): 237 mL via ORAL

## 2019-12-27 MED ORDER — CHLORHEXIDINE GLUCONATE CLOTH 2 % EX PADS
6.0000 | MEDICATED_PAD | Freq: Every day | CUTANEOUS | Status: DC
  Administered 2019-12-27 – 2019-12-31 (×5): 6 via TOPICAL

## 2019-12-27 MED ORDER — PRAVASTATIN SODIUM 20 MG PO TABS
20.0000 mg | ORAL_TABLET | Freq: Every day | ORAL | Status: DC
  Administered 2019-12-27 – 2019-12-31 (×4): 20 mg via ORAL
  Filled 2019-12-27 (×4): qty 1

## 2019-12-27 MED ORDER — DIPHENHYDRAMINE HCL 25 MG PO CAPS: 25.0000 mg | ORAL_CAPSULE | Freq: Four times a day (QID) | ORAL | Status: DC | PRN

## 2019-12-27 MED ORDER — ALBUTEROL SULFATE (2.5 MG/3ML) 0.083% IN NEBU: 2.5000 mg | INHALATION_SOLUTION | RESPIRATORY_TRACT | Status: DC | PRN

## 2019-12-27 MED ORDER — LISINOPRIL 5 MG PO TABS
2.5000 mg | ORAL_TABLET | Freq: Every day | ORAL | Status: DC
  Administered 2019-12-27: 2.5 mg via ORAL
  Filled 2019-12-27: qty 1

## 2019-12-27 MED ORDER — DONEPEZIL HCL 10 MG PO TABS
10.0000 mg | ORAL_TABLET | Freq: Every day | ORAL | Status: DC
  Administered 2019-12-27 – 2019-12-30 (×4): 10 mg via ORAL
  Filled 2019-12-27 (×4): qty 1

## 2019-12-27 MED ORDER — QUETIAPINE FUMARATE 25 MG PO TABS
25.0000 mg | ORAL_TABLET | Freq: Two times a day (BID) | ORAL | Status: DC | PRN
  Administered 2019-12-28: 25 mg via ORAL
  Filled 2019-12-27: qty 1

## 2019-12-27 MED ORDER — SODIUM CHLORIDE 0.9 % IV SOLN
1.0000 g | Freq: Three times a day (TID) | INTRAVENOUS | Status: DC
  Administered 2019-12-27 – 2019-12-29 (×6): 1 g via INTRAVENOUS
  Filled 2019-12-27 (×8): qty 1

## 2019-12-27 MED ORDER — ENOXAPARIN SODIUM 40 MG/0.4ML ~~LOC~~ SOLN
40.0000 mg | SUBCUTANEOUS | Status: DC
  Administered 2019-12-27 – 2019-12-29 (×3): 40 mg via SUBCUTANEOUS
  Filled 2019-12-27 (×4): qty 0.4

## 2019-12-27 MED ORDER — LISINOPRIL 5 MG PO TABS
2.5000 mg | ORAL_TABLET | Freq: Every day | ORAL | Status: DC
  Administered 2019-12-28 – 2019-12-29 (×2): 2.5 mg via ORAL
  Filled 2019-12-27 (×3): qty 1

## 2019-12-27 MED ORDER — GUAIFENESIN-DM 100-10 MG/5ML PO SYRP: 10.0000 mL | ORAL_SOLUTION | ORAL | Status: DC | PRN

## 2019-12-27 MED ORDER — ACETAMINOPHEN 325 MG PO TABS: 650.0000 mg | ORAL_TABLET | Freq: Four times a day (QID) | ORAL | Status: DC | PRN

## 2019-12-27 MED ORDER — SODIUM CHLORIDE 0.9 % IV SOLN
1.0000 g | Freq: Three times a day (TID) | INTRAVENOUS | Status: DC
  Filled 2019-12-27: qty 1

## 2019-12-27 MED ORDER — SODIUM CHLORIDE 0.9 % IV SOLN: INTRAVENOUS | Status: DC

## 2019-12-27 MED ORDER — ONDANSETRON HCL 4 MG PO TABS: 4.0000 mg | ORAL_TABLET | Freq: Four times a day (QID) | ORAL | Status: DC | PRN

## 2019-12-27 MED ORDER — TRAMADOL HCL 50 MG PO TABS
50.0000 mg | ORAL_TABLET | Freq: Two times a day (BID) | ORAL | Status: DC | PRN
  Administered 2019-12-28: 50 mg via ORAL
  Filled 2019-12-27 (×2): qty 1

## 2019-12-27 MED ORDER — MEMANTINE HCL 10 MG PO TABS
10.0000 mg | ORAL_TABLET | Freq: Two times a day (BID) | ORAL | Status: DC
  Administered 2019-12-27 – 2019-12-30 (×7): 10 mg via ORAL
  Filled 2019-12-27 (×8): qty 1

## 2019-12-27 MED ORDER — SODIUM CHLORIDE 0.9 % IV SOLN
1.0000 g | Freq: Once | INTRAVENOUS | Status: AC
  Administered 2019-12-27: 1 g via INTRAVENOUS
  Filled 2019-12-27: qty 1

## 2019-12-27 MED ORDER — ONDANSETRON HCL 4 MG/2ML IJ SOLN: 4.0000 mg | Freq: Four times a day (QID) | INTRAMUSCULAR | Status: DC | PRN

## 2019-12-27 NOTE — Progress Notes (Addendum)
Pharmacy Antibiotic Note  Tammy Arnold is a 79 y.o. female with hx foley cath and dementia presented to the ED on n 12/27/2019 with AMS.  Pharmacy has been consulted to start meropenem for broad empiric coverage.  - Tmax 99.4, WBC 12.7 - scr 0.56 - keflex and PCN listed in allergies section (rxn = rash). No reactions documented with meropenem dose given at ~6a this morning  Microbiology results:  3/4 BCx:  3/4 UCx:  3/4 UA: many bacteria, >50, large leuk, pos nitrtite  Plan: - meropenem 1gm IV q8h - f/u with cx results  _______________________________________  Temp (24hrs), Avg:99.1 F (37.3 C), Min:98.2 F (36.8 C), Max:99.8 F (37.7 C)  Recent Labs  Lab 12/27/19 0412  WBC 12.7*  CREATININE 0.56  LATICACIDVEN 1.0    CrCl cannot be calculated (Unknown ideal weight.).    Allergies  Allergen Reactions  . Baclofen Other (See Comments)    Caused brain dysfunction  . Keflex [Cephalexin] Rash  . Macrodantin [Nitrofurantoin] Rash  . Neggram [Nalidixic Acid] Rash  . Nitrofurantoin Monohyd Macro Rash  . Penicillins Rash    Has patient had a PCN reaction causing immediate rash, facial/tongue/throat swelling, SOB or lightheadedness with hypotension: Yes Has patient had a PCN reaction causing severe rash involving mucus membranes or skin necrosis: No Has patient had a PCN reaction that required hospitalization: No Has patient had a PCN reaction occurring within the last 10 years: No If all of the above answers are "NO", then may proceed with Cephalosporin use.  . Sulfa Antibiotics Rash     Thank you for allowing pharmacy to be a part of this patient's care.  Lynelle Doctor 12/27/2019 8:36 AM

## 2019-12-27 NOTE — ED Provider Notes (Signed)
TIME SEEN: 3:34 AM  CHIEF COMPLAINT: AMS  HPI: Patient is a 79 y.o. F with h/o dementia, CVA, HTN, HLD who presents to ED with AMS.  History is provided by patient's husband.  Husband reports that patient was positive for COVID January 9th.  Slow decline since that time.  Admitted 11/07/19 for 5 days.  Went to [facility] re[facility] afterwards.  Came home on the January 29.  Then recently had urinary retention and was seen in ED 11/23/19 requiring foley catheter.  Has been in place for over 1 month.    Reports for the last several days she has been "listless".  Became agitated, cursing tonight.  Husband checked temperature and was 38.2 C at home.  Husband gave hydrocodone 5/325mg  prior to EMS picking her up.    No changes in medication but has missed the past several days medication because she will not take them.  Not eating and drinking as much.  She fell out of wheelchair but did not hit head approximately 2-3 weeks ago.  She is not on any blood thinners.    Does not ambulate at baseline.    [name] husband is a physician.      PCP - Kelton Pillar  ROS: Level 5 caveat secondary to altered mental status, dementia  PAST MEDICAL HISTORY/PAST SURGICAL HISTORY:  Past Medical History:  Diagnosis Date  . Arthritis   . High cholesterol   . Hypertension   . Stroke [facility])     MEDICATIONS:  Prior to Admission medications   Medication Sig Start Date End Date Taking? Authorizing Provider  acetaminophen (TYLENOL) 500 MG tablet Take 1,000 mg by mouth every 6 (six) hours as needed for mild pain.   Yes [provider]  donepezil (ARICEPT) 10 MG tablet Take 10 mg by mouth at bedtime. 11/21/19  Yes [provider]  HYDROcodone-acetaminophen (NORCO/VICODIN) 5-325 MG tablet Take 1 tablet by mouth 3 (three) times daily as needed for moderate pain.  11/23/19  Yes [provider]  lisinopril (PRINIVIL,ZESTRIL) 2.5 MG tablet Take 1 tablet (2.5 mg total) by mouth daily. 12/28/17  Yes  Love, Ivan Anchors, PA-C  memantine (NAMENDA) 10 MG tablet Take 1 tablet (10 mg total) by mouth 2 (two) times daily. 07/17/19  Yes Garvin Fila, MD  pravastatin (PRAVACHOL) 20 MG tablet Take 1 tablet (20 mg total) by mouth daily at 6 PM. 12/07/17  Yes Vann, Tomi Bamberger, DO  acetaminophen (TYLENOL) 325 MG tablet Take 2 tablets (650 mg total) by mouth every 6 (six) hours as needed for mild pain (or Fever >/= 101). Patient not taking: Reported on 12/27/2019 11/12/19   Raiford Noble Latif, DO  albuterol (VENTOLIN HFA) 108 [phone] Base) MCG/ACT inhaler Inhale 1-2 puffs into the lungs every 4 (four) hours as needed for wheezing or shortness of breath. Patient not taking: Reported on 11/29/2019 11/12/19   Raiford Noble Latif, DO  ascorbic acid (VITAMIN C) 500 MG tablet Take 1 tablet (500 mg total) by mouth daily. Patient not taking: Reported on 11/29/2019 11/13/19   Raiford Noble Latif, DO  feeding supplement, ENSURE ENLIVE, (ENSURE ENLIVE) LIQD Take 237 mLs by mouth 3 (three) times daily between meals. Patient not taking: Reported on 11/29/2019 11/12/19   Raiford Noble Latif, DO  guaiFENesin-dextromethorphan (ROBITUSSIN DM) 100-10 MG/5ML syrup Take 10 mLs by mouth every 4 (four) hours as needed for cough. Patient not taking: Reported on 11/29/2019 11/12/19   Raiford Noble Latif, DO  zinc sulfate 220 (50 Zn) MG capsule  Take 1 capsule (220 mg total) by mouth daily. Patient not taking: Reported on 11/29/2019 11/13/19   Raiford Noble Latif, DO    ALLERGIES:  Allergies  Allergen Reactions  . Baclofen Other (See Comments)    Caused brain dysfunction  . Keflex [Cephalexin] Rash  . Macrodantin [Nitrofurantoin] Rash  . Neggram [Nalidixic Acid] Rash  . Nitrofurantoin Monohyd Macro Rash  . Penicillins Rash    Has patient had a PCN reaction causing immediate rash, facial/tongue/throat swelling, SOB or lightheadedness with hypotension: Yes Has patient had a PCN reaction causing severe rash involving mucus membranes or skin necrosis:  No Has patient had a PCN reaction that required hospitalization: No Has patient had a PCN reaction occurring within the last 10 years: No If all of the above answers are "NO", then may proceed with Cephalosporin use.  . Sulfa Antibiotics Rash    SOCIAL HISTORY:  Social History   Tobacco Use  . Smoking status: Never Smoker  . Smokeless tobacco: Never Used  . Tobacco comment: smoked very little in college none since  Substance Use Topics  . Alcohol use: No    Alcohol/week: 0.0 standard drinks    FAMILY HISTORY: Family History  Problem Relation Age of Onset  . Dementia Mother     EXAM: BP 127/67 (BP Location: Right Arm)   Pulse 96   Temp 98.2 F (36.8 C) (Oral)   Resp 16   SpO2 98%  CONSTITUTIONAL: Alert and oriented to person only.  Becomes extremely agitated during physical exam.  She begins yelling and trying to strike the physician. HEAD: Normocephalic, atraumatic EYES: Conjunctivae clear, pupils appear equal, EOM appear intact ENT: normal nose; moist mucous membranes NECK: Supple, normal ROM CARD: RRR; S1 and S2 appreciated; no murmurs, no clicks, no rubs, no gallops RESP: Normal chest excursion without splinting or tachypnea; breath sounds clear and equal bilaterally; no wheezes, no rhonchi, no rales, no hypoxia or respiratory distress, speaking full sentences ABD/GI: Normal bowel sounds; non-distended; soft, non-tender, no rebound, no guarding, no peritoneal signs, no hepatosplenomegaly BACK:  The back appears normal EXT: Normal ROM in all joints; no deformity noted, 1+ edema and bilateral lower extremities to the mid shin; no cyanosis SKIN: Normal color for age and race; warm; no rash on exposed skin NEURO: Moves all extremities equally, speech is clear, no facial asymmetry PSYCH: Patient intermittently agitated.  MEDICAL DECISION MAKING: Patient here with altered mental status.  Has dementia at baseline but appears to be declining.  Febrile at home.  Concern for  possible UTI.  Will obtain labs, cultures, urine, chest x-ray.  Will check rectal temperature here.  Will obtain CT of the head.  Anticipate patient will need admission and husband agrees with this plan.  ED PROGRESS: [name] urine appears infected.  Culture pending.  Multiple drug allergies listed.  Discussed with pharmacist, Eustaquio Maize, who recommends IV meropenem.  Labs show leukocytosis of 12.7 with left shift.  Otherwise reassuring.  Updated patient's husband by phone.    5:11 AM Discussed patient's case with hospitalist, Dr. Alcario Drought.  I have recommended admission and patient (and family if present) agree with this plan. Admitting physician will place admission orders.   I reviewed all nursing notes, vitals, pertinent previous records and interpreted all EKGs, lab and urine results, imaging (as available).    EKG Interpretation  Date/Time:  Thursday December 27 2019 04:14:49 EST Ventricular Rate:  87 PR Interval:    QRS Duration: 89 QT Interval:  368 QTC Calculation: 443  R Axis:   89 Text Interpretation: Sinus rhythm Borderline right axis deviation No significant change since last tracing Confirmed by Pryor Curia [phone]) on 12/27/2019 4:52:18 AM          Corky Sox was evaluated in Emergency Department on 12/27/2019 for the symptoms described in the history of present illness. She was evaluated in the context of the global COVID-19 pandemic, which necessitated consideration that the patient might be at risk for infection with the SARS-CoV-2 virus that causes COVID-19. Institutional protocols and algorithms that pertain to the evaluation of patients at risk for COVID-19 are in a state of rapid change based on information released by regulatory bodies including the CDC and federal and state organizations. These policies and algorithms were followed during the patient's care in the ED.  Patient was seen wearing N95, face shield, gloves.    Taylinn Brabant, Delice Bison, DO 12/27/19 [phone]

## 2019-12-27 NOTE — Telephone Encounter (Signed)
Received a Palliative care referral from Dr. Delene Ruffini office. Contacted and spoke with patient's spouse, Tammy Arnold, to schedule a home visit. He advised that he had to have patient taken to [facility] and she was admitted with fever and UTI. He requests that I call back next week to schedule.

## 2019-12-27 NOTE — ED Notes (Signed)
X RAY at bedside 

## 2019-12-27 NOTE — H&P (Addendum)
History and Physical    DOA: 12/27/2019  PCP: Kelton Pillar, MD  Patient coming from: home  Chief Complaint: AMS  HPI: Tammy Arnold is a 79 y.o. female with history h/o dementia, ?CVA, HTN, HLD who does not ambulate at baseline presents to ED with AMS. Per husband (who is a local physician) patient has had slow decline since she had COVID infection in January, was hospitalized for 5 days and subsequently sent to [facility] for rehab. Was seen in ED again on 1/29 for urinary retention requiring indwelling foley which has been in place for over a month.Reports for the last several days she has been "listless", has had poor oral intake, missed medications in the past several days because she will not take them.Became agitated, cursing last night. Husband checked temperature and was 38.2 C at home-gave hydrocodone 5/325mg  prior to EMS picking her up.  She reportedly fell out of wheelchair but did not hit head approximately 2-3 weeks ago.  She has chronic back issues and follows Ortho/pain clinic.  Husband has not been able to take her to any follow-up appointments including urology due to mobility issues and complete dependence on ADLs.  He states he spoke to a urologist on the phone and was supposed to have Foley exchanged by home health today. ED course: BP 127/67, Pulse 96,Temp 98.2 F (36.8 C), Resp 16 , SpO2 98% . Labs show leukocytosis of 12.7 with left shift, Hgb 11.4 (baseline 13-14).  Albumin 2.9. Otherwise reassuring. U/A : with pyuria ( wbc >50) ,RBC 21-50, bacteria many, nitrite positive. Urine cx pending. COVID screen sent but had infection within 90 days and wont be surprised if positive again. CXR unrermarkable. EKG NSR, Qtc 443 ms     Review of Systems: As per HPI otherwise 10 point review of systems negative.    Past Medical History:  Diagnosis Date  . Arthritis   . High cholesterol   . Hypertension   . Stroke [facility])     Past Surgical History:  Procedure Laterality  Date  . CATARACT EXTRACTION  11/23/2017  . REPLACEMENT TOTAL KNEE      Social history:  reports that she has never smoked. She has never used smokeless tobacco. She reports previous drug use. She reports that she does not drink alcohol.   Allergies  Allergen Reactions  . Baclofen Other (See Comments)    Caused brain dysfunction  . Keflex [Cephalexin] Rash  . Macrodantin [Nitrofurantoin] Rash  . Neggram [Nalidixic Acid] Rash  . Nitrofurantoin Monohyd Macro Rash  . Penicillins Rash    Has patient had a PCN reaction causing immediate rash, facial/tongue/throat swelling, SOB or lightheadedness with hypotension: Yes Has patient had a PCN reaction causing severe rash involving mucus membranes or skin necrosis: No Has patient had a PCN reaction that required hospitalization: No Has patient had a PCN reaction occurring within the last 10 years: No If all of the above answers are "NO", then may proceed with Cephalosporin use.  . Sulfa Antibiotics Rash    Family History  Problem Relation Age of Onset  . Dementia Mother       Prior to Admission medications   Medication Sig Start Date End Date Taking? Authorizing Provider  acetaminophen (TYLENOL) 500 MG tablet Take 1,000 mg by mouth every 6 (six) hours as needed for mild pain.   Yes [provider]  donepezil (ARICEPT) 10 MG tablet Take 10 mg by mouth at bedtime. 11/21/19  Yes [provider]  HYDROcodone-acetaminophen (  NORCO/VICODIN) 5-325 MG tablet Take 1 tablet by mouth 3 (three) times daily as needed for moderate pain.  11/23/19  Yes [provider]  lisinopril (PRINIVIL,ZESTRIL) 2.5 MG tablet Take 1 tablet (2.5 mg total) by mouth daily. 12/28/17  Yes Love, Ivan Anchors, PA-C  memantine (NAMENDA) 10 MG tablet Take 1 tablet (10 mg total) by mouth 2 (two) times daily. 07/17/19  Yes Garvin Fila, MD  pravastatin (PRAVACHOL) 20 MG tablet Take 1 tablet (20 mg total) by mouth daily at 6 PM. 12/07/17  Yes Vann, Tomi Bamberger,  DO  acetaminophen (TYLENOL) 325 MG tablet Take 2 tablets (650 mg total) by mouth every 6 (six) hours as needed for mild pain (or Fever >/= 101). Patient not taking: Reported on 12/27/2019 11/12/19   Raiford Noble Latif, DO  albuterol (VENTOLIN HFA) 108 [phone] Base) MCG/ACT inhaler Inhale 1-2 puffs into the lungs every 4 (four) hours as needed for wheezing or shortness of breath. Patient not taking: Reported on 11/29/2019 11/12/19   Raiford Noble Latif, DO  ascorbic acid (VITAMIN C) 500 MG tablet Take 1 tablet (500 mg total) by mouth daily. Patient not taking: Reported on 11/29/2019 11/13/19   Raiford Noble Latif, DO  feeding supplement, ENSURE ENLIVE, (ENSURE ENLIVE) LIQD Take 237 mLs by mouth 3 (three) times daily between meals. Patient not taking: Reported on 11/29/2019 11/12/19   Raiford Noble Latif, DO  guaiFENesin-dextromethorphan (ROBITUSSIN DM) 100-10 MG/5ML syrup Take 10 mLs by mouth every 4 (four) hours as needed for cough. Patient not taking: Reported on 11/29/2019 11/12/19   Raiford Noble Latif, DO  zinc sulfate 220 (50 Zn) MG capsule Take 1 capsule (220 mg total) by mouth daily. Patient not taking: Reported on 11/29/2019 11/13/19   Kerney Elbe, DO    Physical Exam: Vitals:   12/27/19 0500 12/27/19 0600 12/27/19 0638 12/27/19 0855  BP: 117/61 (!) 125/59 139/90   Pulse: 82 85 95   Resp: 19 16 16    Temp:   99.4 F (37.4 C)   TempSrc:   Oral   SpO2: 97% 100% 98%   Weight:    61.4 kg  Height:    5\' 4"  (1.626 m)    Constitutional: NAD, calm currently, pleasantly confused Eyes: PERRL, lids and conjunctivae normal ENMT: Mucous membranes are moist. Posterior pharynx clear of any exudate  Neck: normal, supple, no masses, no thyromegaly Respiratory: clear to auscultation bilaterally, no wheezing, no crackles. Normal respiratory effort. No accessory muscle use.  Cardiovascular: Regular rate and rhythm, no murmurs / rubs / gallops. No extremity edema. 2+ pedal pulses. No carotid bruits.    Abdomen: no tenderness, no masses palpated. No hepatosplenomegaly. Bowel sounds positive.  Musculoskeletal: no clubbing / cyanosis. No joint deformity upper and lower extremities.  Reduced range of motion on lower extremity flexion due to?  Pain Neurologic: Pleasantly confused, CN 2-12 grossly intact. Sensation intact, DTR normal. Strength 5/5 in all 4.  Psychiatric: Normal judgment and insight. Alert and oriented partially to self (was able to tell me her last name but not first name).  Irritable mood at times.  SKIN/catheters: Indwelling Foley catheter (placement dated in January)  Labs on Admission: I have personally reviewed following labs and imaging studies  CBC: Recent Labs  Lab 12/27/19 0412  WBC 12.7*  NEUTROABS 8.6*  HGB 11.4*  HCT 35.6*  MCV 89.9  PLT [id number]   Basic Metabolic Panel: Recent Labs  Lab 12/27/19 0412  NA 136  K 3.8  CL 105  CO2 21*  GLUCOSE 116*  BUN 14  CREATININE 0.56  CALCIUM 8.6*   GFR: Estimated Creatinine Clearance: 50.9 mL/min (by C-G formula based on SCr of 0.56 mg/dL). Recent Labs  Lab 12/27/19 0412  WBC 12.7*  LATICACIDVEN 1.0   Liver Function Tests: Recent Labs  Lab 12/27/19 0412  AST 23  ALT 15  ALKPHOS 58  BILITOT 0.8  PROT 7.0  ALBUMIN 2.9*   No results for input(s): LIPASE, AMYLASE in the last 168 hours. No results for input(s): AMMONIA in the last 168 hours. Coagulation Profile: No results for input(s): INR, PROTIME in the last 168 hours. Cardiac Enzymes: No results for input(s): CKTOTAL, CKMB, CKMBINDEX, TROPONINI in the last 168 hours. BNP (last 3 results) No results for input(s): PROBNP in the last 8760 hours. HbA1C: No results for input(s): HGBA1C in the last 72 hours. CBG: No results for input(s): GLUCAP in the last 168 hours. Lipid Profile: No results for input(s): CHOL, HDL, LDLCALC, TRIG, CHOLHDL, LDLDIRECT in the last 72 hours. Thyroid Function Tests: No results for input(s): TSH, T4TOTAL, FREET4,  T3FREE, THYROIDAB in the last 72 hours. Anemia Panel: No results for input(s): VITAMINB12, FOLATE, FERRITIN, TIBC, IRON, RETICCTPCT in the last 72 hours. Urine analysis:    Component Value Date/Time   COLORURINE AMBER (A) 12/27/2019 0412   APPEARANCEUR CLOUDY (A) 12/27/2019 0412   LABSPEC 1.024 12/27/2019 0412   PHURINE 6.0 12/27/2019 0412   GLUCOSEU NEGATIVE 12/27/2019 0412   HGBUR NEGATIVE 12/27/2019 0412   BILIRUBINUR NEGATIVE 12/27/2019 0412   KETONESUR NEGATIVE 12/27/2019 0412   PROTEINUR 100 (A) 12/27/2019 0412   UROBILINOGEN 0.2 08/25/2007 0835   NITRITE POSITIVE (A) 12/27/2019 0412   LEUKOCYTESUR LARGE (A) 12/27/2019 0412    Radiological Exams on Admission: Personally reviewed  CT Head Wo Contrast  Result Date: 12/27/2019 CLINICAL DATA:  Increased confusion and lethargy EXAM: CT HEAD WITHOUT CONTRAST TECHNIQUE: Contiguous axial images were obtained from the base of the skull through the vertex without intravenous contrast. COMPARISON:  November 07, 2019 FINDINGS: Brain: No evidence of acute territorial infarction, hemorrhage, hydrocephalus,extra-axial collection or mass lesion/mass effect. There is dilatation the ventricles and sulci consistent with age-related atrophy. Low-attenuation changes in the deep white matter consistent with small vessel ischemia. Vascular: No hyperdense vessel or unexpected calcification. Skull: The skull is intact. No fracture or focal lesion identified. Sinuses/Orbits: The visualized paranasal sinuses and mastoid air cells are clear. The orbits and globes intact. Other: None IMPRESSION: No acute intracranial abnormality. Somewhat limited due to patient motion Findings consistent with advanced age related atrophy and chronic small vessel ischemia Electronically Signed   By: Prudencio Pair M.D.   On: 12/27/2019 04:34   DG Chest Portable 1 View  Result Date: 12/27/2019 CLINICAL DATA:  Altered mental status EXAM: PORTABLE CHEST 1 VIEW COMPARISON:  November 07, 2019 FINDINGS: The heart size and mediastinal contours are within normal limits. Aortic knob calcifications. Both lungs are clear. The visualized skeletal structures are unremarkable. IMPRESSION: No active disease. Electronically Signed   By: Prudencio Pair M.D.   On: 12/27/2019 04:20    EKG: Independently reviewed. NSR with Right axis deviation and Qtc 443 ms     Assessment and Plan:   Principal Problem:   Acute metabolic encephalopathy Active Problems:   Essential hypertension   HLD (hyperlipidemia)   Cognitive impairment   Small vessel disease, cerebrovascular   Acute lower UTI   Foley catheter in place on admission    1. Catheter associated  UTI with metabolic encephalopathy: POA. Has PCN/cephalexin allergy listed. Foley placed for urinary retention for 1200 ml in January --will replace. May benefit from urology evaluation, replace foley now ( was supposed to be changed today by [facility]) .  Husband states he had spoken to Dr. Gayla Doss in the past--will consult.  2.  Dementia: with recent functional /cognitive decline in the setting of recurrent infections and not taking medications. Seroquel prn for agitation/delirium (moniotr qtc)  3.?  TIA/CVA: Not on asa at home.  Per husband, patient never had stroke and was told to have cerebral atrophy and  microvascular disease.  Resume statins  4. HTN/hyperlipidemia: resume home meds.   5. Spinal stenosis: follows Dr Nelva Bush ( [facility])  and received epidural as outpatient -last one a year back.  Has chronic pain with range of motion of lower extremities per husband.  Dependent on all ADLs at baseline.  6.  Multiple drug allergies: Patient apparently developed severe rash in the past with many antibiotics as listed above.  She also had extensive lower extremity rash recently which was biopsied and told to have hypersensitivity rash.  Watch closely on meropenem.  Benadryl as needed available.  DVT prophylaxis: Lovenox  COVID screen:  pending but was positive in Jan  Code Status:  Full code  .Health care proxy would be husband  Patient/Family Communication: Discussed with patient/husbnad and all questions answered to satisfaction.  Consults called: Urology Admission status :I certify that at the point of admission it is my clinical judgment that the patient will require inpatient hospital care spanning beyond 2 midnights from the point of admission due to high intensity of service and high frequency of surveillance required.Inpatient status is judged to be reasonable and necessary in order to provide the required intensity of service to ensure the patient's safety. The patient's presenting symptoms, physical exam findings, and initial radiographic and laboratory data in the context of their chronic comorbidities is felt to place them at high risk for further clinical deterioration. The following factors support the patient status of inpatient : UTI with metabolic encephalopathy requiring IV abx in the setting of PCN/cephalosporin allergy     Guilford Shi MD [facility] Pager in [place]  If 7PM-7AM, please contact night-coverage [email or web address]   12/27/2019, 10:44 AM

## 2019-12-27 NOTE — Progress Notes (Signed)
Pt arrived to room 1512 at 0635. Pt with AMS, alzheimers. Oriented as best as possible to room, and settled into bed. Urinary foley secured to thigh with stat lock. mepilex applied to sacrum (pt has a small pressure wound). Pt unable to answer admission questions. Bed alarm on. Report given to day shift RN.

## 2019-12-27 NOTE — ED Triage Notes (Signed)
PER EMS: Pt is coming from home with c/o altered mental status. Per patients husband, the patient has had increased confusion for the past three days. Pt is more lethargic than usual and will stare off into space. Hx Alzheimer's. Pt is bed bound and limbs become contractured at times. Pt has a history of UTI's. Urinary catheter that was changed one month ago. Pt was given prescribed hydrocodone at 0130am. Patient was unable to swallow tylenol pill.  EMS VITALS: BP 149/89 HR 105 RR 18 SPO2 98% RA CBG 144 TEMP 100.4

## 2019-12-28 LAB — BASIC METABOLIC PANEL WITH GFR
Anion gap: 8 (ref 5–15)
BUN: 11 mg/dL (ref 8–23)
CO2: 19 mmol/L — ABNORMAL LOW (ref 22–32)
Calcium: 8.2 mg/dL — ABNORMAL LOW (ref 8.9–10.3)
Chloride: 109 mmol/L (ref 98–111)
Creatinine, Ser: 0.53 mg/dL (ref 0.44–1.00)
GFR calc Af Amer: >60 mL/min
GFR calc non Af Amer: >60 mL/min
Glucose, Bld: 78 mg/dL (ref 70–99)
Potassium: 4 mmol/L (ref 3.5–5.1)
Sodium: 136 mmol/L (ref 135–145)

## 2019-12-28 LAB — CBC
HCT: 29.7 % — ABNORMAL LOW (ref 36.0–46.0)
Hemoglobin: 9.5 g/dL — ABNORMAL LOW (ref 12.0–15.0)
MCH: 29.3 pg (ref 26.0–34.0)
MCHC: 32 g/dL (ref 30.0–36.0)
MCV: 91.7 fL (ref 80.0–100.0)
Platelets: 301 10*3/uL (ref 150–400)
RBC: 3.24 MIL/uL — ABNORMAL LOW (ref 3.87–5.11)
RDW: 12.9 % (ref 11.5–15.5)
WBC: 9.5 10*3/uL (ref 4.0–10.5)
nRBC: 0 % (ref 0.0–0.2)

## 2019-12-28 MED ORDER — PRO-STAT SUGAR FREE PO LIQD
30.0000 mL | Freq: Two times a day (BID) | ORAL | Status: DC
  Administered 2019-12-28 – 2019-12-29 (×2): 30 mL via ORAL
  Filled 2019-12-28 (×4): qty 30

## 2019-12-28 NOTE — Progress Notes (Signed)
PROGRESS NOTE  Tammy Arnold  [id number] DOB: 10-22-1941 DOA: 12/27/2019 PCP: Kelton Pillar, MD  Brief Narrative: Tammy Arnold is a 79 y.o. female with a history of dementia, HTN, HLD, cerebrovascular disease without known stroke, urinary retention s/p foley, and spinal stenosis with debility who presented from home with altered mental status. She had been hospitalized for covid-19 pneumonia in Jan 2021, discharged to [facility], and subsequently sent home. Over the past several days she had become more listless and having poor oral intake, missing medications, ultimately becoming agitated prompting ED visit 3/4. In the ED, she was afebrile with neutrophilic leukocytosis at [id number] and grossly positive urinalysis. Due to allergy profile, meropenem was started and the patient admitted to [facility].  Assessment & Plan: Principal Problem:   Acute metabolic encephalopathy Active Problems:   Essential hypertension   HLD (hyperlipidemia)   Cognitive impairment   Small vessel disease, cerebrovascular   Acute lower UTI   Foley catheter in place on admission  CAUTI: Enterococcus growing on urine culture. Will not broad coverage due to hemodynamic stability.  - Continue meropenem pending urine culture data, follow blood cultures as well.  Acute metabolic encephalopathy on chronic dementia: CT head without acute hemorrhage, etc. There were low-attenuation changes in the deep white matter consistent with small vessel ischemia and age-consistent atrophy. - Treat UTI as above.  - Delirium precautions - Continue aricept (monitor for bradycardia), namenda, prn seroquel  HTN:  - Continue lisinopril  HLD:  - Continue statin  Spinal stenosis:  - Follow up with orthopedics, Dr. Nelva [facility], for ongoing management, has had epidural injections in the past.  - Continue pain control, will consult palliative care for functional decline.  Anemia of chronic disease: Suspected etiology of  normocytic/normochromic anemia in absence of acute bleeding. Hgb declined along with other cell lines consistent with hemodilution.  - Monitor closely. Ok to continue DVT ppx  DVT prophylaxis: Lovenox Code Status: Full Family Communication: Husband by phone today Disposition Plan: Patient from home, remains with altered mental status undergoing treatment for infection. Will get PT/OT to inform disposition.   Consultants:   Palliative care medicine team  Procedures:   Foley catheter exchange 12/27/2019  Antimicrobials:  Meropenem 3/4 >>    Subjective: Confused, denies any pain. Hasn't touched lunch, inconsistently cooperative with care.   Objective: Vitals:   12/27/19 1421 12/27/19 2128 12/28/19 0411 12/28/19 0841  BP: 124/65 129/70 (!) 133/58 138/65  Pulse: 87 95 91 88  Resp: 18 18 16    Temp: 98.1 F (36.7 C) 99.5 F (37.5 C) 98.6 F (37 C)   TempSrc: Oral  Oral   SpO2: 99% 96% 97%   Weight:      Height:        Intake/Output Summary (Last 24 hours) at 12/28/2019 1330 Last data filed at 12/28/2019 0500 Gross per 24 hour  Intake 721.36 ml  Output 500 ml  Net 221.36 ml   Filed Weights   12/27/19 0855  Weight: 61.4 kg    Gen: 79 y.o. female in no distress resting quietly supine Pulm: Non-labored breathing room air. Clear to auscultation bilaterally.  CV: Regular rate and rhythm. No murmur, rub, or gallop. No JVD, no pitting pedal edema. GI: Abdomen soft, non-tender, non-distended, with normoactive bowel sounds. No organomegaly or masses felt. GU:  Foley catheter intact. Ext: Warm, no deformities Skin: No rashes, lesions or ulcers Neuro: Alert, interactive but disoriented, moves all extremities, denies sensory deficits on tested areas, no dysarthria. Psych: Judgement  and insight appear impaired, frequent word blocking and confused tangents.   Data Reviewed: I have personally reviewed following labs and imaging studies  CBC: Recent Labs  Lab 12/27/19 0412  12/28/19 0608  WBC 12.7* 9.5  NEUTROABS 8.6*  --   HGB 11.4* 9.5*  HCT 35.6* 29.7*  MCV 89.9 91.7  PLT 344 [id number]   Basic Metabolic Panel: Recent Labs  Lab 12/27/19 0412 12/28/19 0608  NA 136 136  K 3.8 4.0  CL 105 109  CO2 21* 19*  GLUCOSE 116* 78  BUN 14 11  CREATININE 0.56 0.53  CALCIUM 8.6* 8.2*   GFR: Estimated Creatinine Clearance: 50.9 mL/min (by C-G formula based on SCr of 0.53 mg/dL). Liver Function Tests: Recent Labs  Lab 12/27/19 0412  AST 23  ALT 15  ALKPHOS 58  BILITOT 0.8  PROT 7.0  ALBUMIN 2.9*   No results for input(s): LIPASE, AMYLASE in the last 168 hours. No results for input(s): AMMONIA in the last 168 hours. Coagulation Profile: No results for input(s): INR, PROTIME in the last 168 hours. Cardiac Enzymes: No results for input(s): CKTOTAL, CKMB, CKMBINDEX, TROPONINI in the last 168 hours. BNP (last 3 results) No results for input(s): PROBNP in the last 8760 hours. HbA1C: No results for input(s): HGBA1C in the last 72 hours. CBG: No results for input(s): GLUCAP in the last 168 hours. Lipid Profile: No results for input(s): CHOL, HDL, LDLCALC, TRIG, CHOLHDL, LDLDIRECT in the last 72 hours. Thyroid Function Tests: No results for input(s): TSH, T4TOTAL, FREET4, T3FREE, THYROIDAB in the last 72 hours. Anemia Panel: No results for input(s): VITAMINB12, FOLATE, FERRITIN, TIBC, IRON, RETICCTPCT in the last 72 hours. Urine analysis:    Component Value Date/Time   COLORURINE AMBER (A) 12/27/2019 0412   APPEARANCEUR CLOUDY (A) 12/27/2019 0412   LABSPEC 1.024 12/27/2019 0412   PHURINE 6.0 12/27/2019 0412   GLUCOSEU NEGATIVE 12/27/2019 0412   HGBUR NEGATIVE 12/27/2019 0412   BILIRUBINUR NEGATIVE 12/27/2019 0412   KETONESUR NEGATIVE 12/27/2019 0412   PROTEINUR 100 (A) 12/27/2019 0412   UROBILINOGEN 0.2 08/25/2007 0835   NITRITE POSITIVE (A) 12/27/2019 0412   LEUKOCYTESUR LARGE (A) 12/27/2019 0412   Recent Results (from the past 240 hour(s))    Urine culture     Status: Abnormal (Preliminary result)   Collection Time: 12/27/19  4:12 AM   Specimen: Urine, Clean Catch  Result Value Ref Range Status   Specimen Description   Final    URINE, CLEAN CATCH Performed at [facility], [place] [address], [place], [place] [address]    Special Requests   Final    NONE Performed at [facility], [place] [address] [place], [place] [address]    Culture >=100,000 COLONIES/mL ENTEROCOCCUS FAECALIS (A)  Final   Report Status PENDING  Incomplete  Blood culture (routine x 2)     Status: None (Preliminary result)   Collection Time: 12/27/19  4:12 AM   Specimen: BLOOD  Result Value Ref Range Status   Specimen Description   Final    BLOOD BLOOD RIGHT HAND Performed at [facility] [address] [place], [place] [address]    Special Requests   Final    BOTTLES DRAWN AEROBIC AND ANAEROBIC Blood Culture adequate volume Performed at [facility] [address] [place], [place] [address]    Culture   Final    NO GROWTH 1 DAY Performed at [facility], [place],  [place] [address]    Report Status PENDING  Incomplete  SARS CORONAVIRUS 2 (TAT 6-24 HRS) Nasopharyngeal Nasopharyngeal Swab     Status: None   Collection Time: 12/27/19  6:08 AM   Specimen: Nasopharyngeal Swab  Result Value Ref Range Status   SARS Coronavirus 2 NEGATIVE NEGATIVE Final    Comment: (NOTE) SARS-CoV-2 target nucleic acids are NOT DETECTED. The SARS-CoV-2 RNA is generally detectable in upper and lower respiratory specimens during the acute phase of infection. Negative results do not preclude SARS-CoV-2 infection, do not rule out co-infections with other pathogens, and should not be used as the sole basis for treatment or other patient management decisions. Negative results must be combined with clinical observations, patient history, and epidemiological information. The  expected result is Negative. Fact Sheet for Patients: SugarRoll.be Fact Sheet for Healthcare Providers: https://www.woods-mathews.com/ This test is not yet approved or cleared by the [place] FDA and  has been authorized for detection and/or diagnosis of SARS-CoV-2 by FDA under an Emergency Use Authorization (EUA). This EUA will remain  in effect (meaning this test can be used) for the duration of the COVID-19 declaration under Section 56 4(b)(1) of the Act, 21 U.S.C. section 360bbb-3(b)(1), unless the authorization is terminated or revoked sooner. Performed at [facility], [place] [address] [place], [place] [address]   Blood culture (routine x 2)     Status: None (Preliminary result)   Collection Time: 12/27/19 11:57 AM   Specimen: BLOOD  Result Value Ref Range Status   Specimen Description   Final    BLOOD BLOOD LEFT FOREARM Performed at [facility] [address] [place], [place] [address]    Special Requests   Final    BOTTLES DRAWN AEROBIC ONLY Blood Culture results may not be optimal due to an inadequate volume of blood received in culture bottles Performed at [facility] [address] [place], [place] [address]    Culture   Final    NO GROWTH < 24 HOURS Performed at [facility] [address] [place], [place] [address]    Report Status PENDING  Incomplete      Radiology Studies: CT Head Wo Contrast  Result Date: 12/27/2019 CLINICAL DATA:  Increased confusion and lethargy EXAM: CT HEAD WITHOUT CONTRAST TECHNIQUE: Contiguous axial images were obtained from the base of the skull through the vertex without intravenous contrast. COMPARISON:  November 07, 2019 FINDINGS: Brain: No evidence of acute territorial infarction, hemorrhage, hydrocephalus,extra-axial collection or mass lesion/mass effect. There is dilatation the ventricles and sulci consistent with age-related atrophy.  Low-attenuation changes in the deep white matter consistent with small vessel ischemia. Vascular: No hyperdense vessel or unexpected calcification. Skull: The skull is intact. No fracture or focal lesion identified. Sinuses/Orbits: The visualized paranasal sinuses and mastoid air cells are clear. The orbits and globes intact. Other: None IMPRESSION: No acute intracranial abnormality. Somewhat limited due to patient motion Findings consistent with advanced age related atrophy and chronic small vessel ischemia Electronically Signed   By: Prudencio Pair M.D.   On: 12/27/2019 04:34   DG Chest Portable 1 View  Result Date: 12/27/2019 CLINICAL DATA:  Altered mental status EXAM: PORTABLE CHEST 1 VIEW COMPARISON:  November 07, 2019 FINDINGS: The heart size and mediastinal contours are within normal limits. Aortic knob calcifications. Both lungs are clear. The visualized skeletal structures are unremarkable. IMPRESSION: No active disease. Electronically Signed   By: Prudencio Pair M.D.   On: 12/27/2019 04:20  Scheduled Meds:  Chlorhexidine Gluconate Cloth  6 each Topical Daily   donepezil  10 mg Oral QHS   enoxaparin (LOVENOX) injection  40 mg Subcutaneous Q24H   feeding supplement (ENSURE ENLIVE)  237 mL Oral TID BM   lisinopril  2.5 mg Oral Daily   memantine  10 mg Oral BID   pravastatin  20 mg Oral q1800   Continuous Infusions:  sodium chloride 75 mL/hr at 12/28/19 1238   meropenem (MERREM) IV 1 g (12/28/19 0508)     LOS: 1 day   Time spent: 25 minutes.  Tammy Pour, MD [facility] [email or web address] 12/28/2019, 1:30 PM

## 2019-12-28 NOTE — Progress Notes (Signed)
Patient husband called and requested RN to ask MD to evaluate patient meds to home meds because he is concern that patient might be having some reaction to meds because of patient behavior. RN notified attending MD via secure chat, MD acknowledged notification.

## 2019-12-28 NOTE — Progress Notes (Signed)
Initial Nutrition Assessment  DOCUMENTATION CODES:   Not applicable  INTERVENTION:  Continue Ensure Enlive po BID, each supplement provides 350 kcal and 20 grams of protein  Provide Magic cup TID with meals, each supplement provides 290 kcal and 9 grams of protein  Provide Pro-stat 30 ml po BID, each supplement provides 100 kcal and 15 grams of protein  Recommend feeding assistance with meals to encourage po intake  If patient continues with poor po intake, consider placement of small bore NGT for nutrition in the next 24 -48 hrs    NUTRITION DIAGNOSIS:   Inadequate oral intake related to lethargy/confusion as evidenced by meal completion < 25%.  GOAL:   Patient will meet greater than or equal to 90% of their needs   MONITOR:   Labs, I & O's, Skin, Supplement acceptance, PO intake, Weight trends  REASON FOR ASSESSMENT:   Malnutrition Screening Tool    ASSESSMENT:  79 year old female with past medical history of dementia, CVA, HTN, HLD, recent COVID infection in January s/p 5 day hospitalization and subsequently sent to [facility] for rehab, does not ambulate at baseline, recently seen in ED on 1/29 for urinary retention requiring foley which has been in place for over a month, and several day history of poor oral intake, refusing oral medications, and agitation reported by husband.  Patient admitted on 3/4 for acute metabolic encephalopathy.  Per notes: -enterococcus growing on urine culture -acute metabolic encephalopathy on chronic dementia -spinal stenosis, continue pain control -palliative care consult pending for functional decline.   Patient with 0% po intake x 2 documented meals this admission.   Patient discussed with RN, she reports patient is not eating meals, occasional smalls sips of liquids and Ensure supplement. No swallowing difficulties reported. Patient is provided Ensure twice daily per medication review. RD will provide prostat twice daily as well  as  magic cup with meals and recommend feeding assistance to encourage po intake of meals and supplements.   Per chart review, husband endorsed patient with poor oral intake several days prior to admission. Given recent history of pneumonia secondary to COVID-19 infection and severe dementia, patient is at high risk for malnutrition. Pending goals of care, consider placement of small bore feeding tube for nutrition and medications in the next 24-48 hrs if po intake does not improve.    Non-pitting BLE edema noted per RN assessment.  Current wt 135.08 lb Limited weight history available for review, on 07/17/19 pt wt 62.8 kg (138.16 lbs).  Medications reviewed and include: Aricept, Zestril, Namenda, Pravachol IVF: NaCl Merrem  Labs: BG 78-116  NUTRITION - FOCUSED PHYSICAL EXAM: Deferred    Diet Order:   Diet Order            Diet Heart Room service appropriate? Yes; Fluid consistency: Thin  Diet effective now              EDUCATION NEEDS:   No education needs have been identified at this time  Skin:  Skin Assessment: Skin Integrity Issues: Skin Integrity Issues:: Stage III Stage III: sacrum  Last BM:  PTA  Height:   Ht Readings from Last 1 Encounters:  12/27/19 5\' 4"  (1.626 m)    Weight:   Wt Readings from Last 1 Encounters:  12/27/19 61.4 kg     BMI:  Body mass index is 23.23 kg/m.  Estimated Nutritional Needs:   Kcal:  1600-1800  Protein:  80-90  Fluid:  >/= 1.5 L/day   Vinnie Level  Truddie Crumble, RD, LDN Clinical Nutrition After Hours/Weekend Pager # in [place]

## 2019-12-28 NOTE — Consult Note (Signed)
Urology Consult  Referring physician: Dr. Earnest Conroy Reason for referral: urinary retention  Chief Complaint: urinary retention  History of Present Illness: Tammy Arnold is a 79yo with a hx of HTN and CVA admitted yesterday with catheter acquired UTI. She had a foley placed 11/23/2019 for urinary retention and had not have the foley changed. She has a hx of encephalopathy related to [place] which has not resolved. She can give no history on interview today.  Past Medical History:  Diagnosis Date  . Arthritis   . High cholesterol   . Hypertension   . Stroke [facility])    Past Surgical History:  Procedure Laterality Date  . CATARACT EXTRACTION  11/23/2017  . REPLACEMENT TOTAL KNEE      Medications: I have reviewed the patient's current medications. Allergies:  Allergies  Allergen Reactions  . Baclofen Other (See Comments)    Caused brain dysfunction  . Keflex [Cephalexin] Rash  . Macrodantin [Nitrofurantoin] Rash  . Neggram [Nalidixic Acid] Rash  . Nitrofurantoin Monohyd Macro Rash  . Penicillins Rash    Has patient had a PCN reaction causing immediate rash, facial/tongue/throat swelling, SOB or lightheadedness with hypotension: Yes Has patient had a PCN reaction causing severe rash involving mucus membranes or skin necrosis: No Has patient had a PCN reaction that required hospitalization: No Has patient had a PCN reaction occurring within the last 10 years: No If all of the above answers are "NO", then may proceed with Cephalosporin use.  . Sulfa Antibiotics Rash    Family History  Problem Relation Age of Onset  . Dementia Mother    Social History:  reports that she has never smoked. She has never used smokeless tobacco. She reports previous drug use. She reports that she does not drink alcohol.  Review of Systems  Unable to perform ROS: Mental status change    Physical Exam:  Vital signs in last 24 hours: Temp:  [98.4 F (36.9 C)-99.5 F (37.5 C)] 98.4 F (36.9 C) (03/05  1426) Pulse Rate:  [80-95] 80 (03/05 1426) Resp:  [16-18] 16 (03/05 1426) BP: (129-140)/(58-80) 140/80 (03/05 1426) SpO2:  [95 %-97 %] 95 % (03/05 1426) Physical Exam  Constitutional: She is oriented to person, place, and time. She appears well-developed and well-nourished.  HENT:  Head: Normocephalic and atraumatic.  Eyes: Pupils are equal, round, and reactive to light. EOM are normal.  Neck: No thyromegaly present.  Cardiovascular: Normal rate and regular rhythm.  Respiratory: Effort normal. No respiratory distress.  GI: Soft. She exhibits no distension.  Musculoskeletal:        General: No edema. Normal range of motion.     Cervical back: Normal range of motion.  Neurological: She is alert and oriented to person, place, and time.  Skin: Skin is warm and dry.  Psychiatric: She has a normal mood and affect. Her behavior is normal. Judgment and thought content normal.    Laboratory Data:  Results for orders placed or performed during the hospital encounter of 12/27/19 (from the past 72 hour(s))  CBC with Differential     Status: Abnormal   Collection Time: 12/27/19  4:12 AM  Result Value Ref Range   WBC 12.7 (H) 4.0 - 10.5 K/uL   RBC 3.96 3.87 - 5.11 MIL/uL   Hemoglobin 11.4 (L) 12.0 - 15.0 g/dL   HCT 35.6 (L) 36.0 - 46.0 %   MCV 89.9 80.0 - 100.0 fL   MCH 28.8 26.0 - 34.0 pg   MCHC 32.0 30.0 - 36.0  g/dL   RDW 13.0 11.5 - 15.5 %   Platelets 344 150 - 400 K/uL   nRBC 0.0 0.0 - 0.2 %   Neutrophils Relative % 68 %   Neutro Abs 8.6 (H) 1.7 - 7.7 K/uL   Lymphocytes Relative 24 %   Lymphs Abs 3.0 0.7 - 4.0 K/uL   Monocytes Relative 7 %   Monocytes Absolute 0.9 0.1 - 1.0 K/uL   Eosinophils Relative 0 %   Eosinophils Absolute 0.0 0.0 - 0.5 K/uL   Basophils Relative 0 %   Basophils Absolute 0.0 0.0 - 0.1 K/uL   Immature Granulocytes 1 %   Abs Immature Granulocytes 0.13 (H) 0.00 - 0.07 K/uL    Comment: Performed at [facility], [place] [address],  [place], [place] [address]  Comprehensive metabolic panel     Status: Abnormal   Collection Time: 12/27/19  4:12 AM  Result Value Ref Range   Sodium 136 135 - 145 mmol/L   Potassium 3.8 3.5 - 5.1 mmol/L   Chloride 105 98 - 111 mmol/L   CO2 21 (L) 22 - 32 mmol/L   Glucose, Bld 116 (H) 70 - 99 mg/dL    Comment: Glucose reference range applies only to samples taken after fasting for at least 8 hours.   BUN 14 8 - 23 mg/dL   Creatinine, Ser 0.56 0.44 - 1.00 mg/dL   Calcium 8.6 (L) 8.9 - 10.3 mg/dL   Total Protein 7.0 6.5 - 8.1 g/dL   Albumin 2.9 (L) 3.5 - 5.0 g/dL   AST 23 15 - 41 U/L   ALT 15 0 - 44 U/L   Alkaline Phosphatase 58 38 - 126 U/L   Total Bilirubin 0.8 0.3 - 1.2 mg/dL   GFR calc non Af Amer >60 >60 mL/min   GFR calc Af Amer >60 >60 mL/min   Anion gap 10 5 - 15    Comment: Performed at [facility], [place] [address] [place], [place] [address]  Urinalysis, Routine w reflex microscopic     Status: Abnormal   Collection Time: 12/27/19  4:12 AM  Result Value Ref Range   Color, Urine AMBER (A) YELLOW    Comment: BIOCHEMICALS MAY BE AFFECTED BY COLOR   APPearance CLOUDY (A) CLEAR   Specific Gravity, Urine 1.024 1.005 - 1.030   pH 6.0 5.0 - 8.0   Glucose, UA NEGATIVE NEGATIVE mg/dL   Hgb urine dipstick NEGATIVE NEGATIVE   Bilirubin Urine NEGATIVE NEGATIVE   Ketones, ur NEGATIVE NEGATIVE mg/dL   Protein, ur 100 (A) NEGATIVE mg/dL   Nitrite POSITIVE (A) NEGATIVE   Leukocytes,Ua LARGE (A) NEGATIVE   RBC / HPF 21-50 0 - 5 RBC/hpf   WBC, UA >50 (H) 0 - 5 WBC/hpf   Bacteria, UA MANY (A) NONE SEEN   WBC Clumps PRESENT    Mucus PRESENT    Hyaline Casts, UA PRESENT    Ca Oxalate Crys, UA PRESENT     Comment: Performed at [facility], [place] [address] [place], [place] [address]  Urine culture     Status: Abnormal (Preliminary result)   Collection Time: 12/27/19  4:12 AM   Specimen: Urine, Clean Catch  Result Value Ref Range   Specimen Description       URINE, CLEAN CATCH Performed at [facility], [place] [address], [place], [place] [address]    Special Requests      NONE Performed at [facility], [address] [place], [place] [address]    Culture >=100,000 COLONIES/mL ENTEROCOCCUS FAECALIS (A)    Report Status PENDING   Blood culture (routine x 2)     Status: None (Preliminary result)   Collection Time: 12/27/19  4:12 AM   Specimen: BLOOD  Result Value Ref Range   Specimen Description      BLOOD BLOOD RIGHT HAND Performed at [place] [address] [place], [place] [address]    Special Requests      BOTTLES DRAWN AEROBIC AND ANAEROBIC Blood Culture adequate volume Performed at [place] [address] [place], [place] [address]    Culture      NO GROWTH 1 DAY Performed at [place] [address], [place], [place] [address]    Report Status PENDING   Lactic acid, plasma     Status: None   Collection Time: 12/27/19  4:12 AM  Result Value Ref Range   Lactic Acid, Venous 1.0 0.5 - 1.9 mmol/L    Comment: Performed at [facility], [place] [address], [place], [place] [address]  SARS CORONAVIRUS 2 (TAT 6-24 HRS) Nasopharyngeal Nasopharyngeal Swab     Status: None   Collection Time: 12/27/19  6:08 AM   Specimen: Nasopharyngeal Swab  Result Value Ref Range   SARS Coronavirus 2 NEGATIVE NEGATIVE    Comment: (NOTE) SARS-CoV-2 target nucleic acids are NOT DETECTED. The SARS-CoV-2 RNA is generally detectable in upper and lower respiratory specimens during the acute phase of infection. Negative results do not preclude SARS-CoV-2 infection, do not rule out co-infections with other pathogens, and should not be used as the sole basis for treatment or other patient management decisions. Negative results must be combined with clinical observations, patient history, and epidemiological information. The expected result  is Negative. Fact Sheet for Patients: SugarRoll.be Fact Sheet for Healthcare Providers: https://www.woods-mathews.com/ This test is not yet approved or cleared by the [place] FDA and  has been authorized for detection and/or diagnosis of SARS-CoV-2 by FDA under an Emergency Use Authorization (EUA). This EUA will remain  in effect (meaning this test can be used) for the duration of the COVID-19 declaration under Section 56 4(b)(1) of the Act, 21 U.S.C. section 360bbb-3(b)(1), unless the authorization is terminated or revoked sooner. Performed at [facility], [place] [address] [place], [place] [address]   Blood culture (routine x 2)     Status: None (Preliminary result)   Collection Time: 12/27/19 11:57 AM   Specimen: BLOOD  Result Value Ref Range   Specimen Description      BLOOD BLOOD LEFT FOREARM Performed at [place] [address] [place], [place] [address]    Special Requests      BOTTLES DRAWN AEROBIC ONLY Blood Culture results may not be optimal due to an inadequate volume of blood received in culture bottles Performed at [place], [place] [address], [place], [place] [address]    Culture      NO GROWTH < 24 HOURS Performed at [place] [address], [place],  [address]    Report Status PENDING   Basic metabolic panel     Status: Abnormal   Collection Time: 12/28/19  6:08 AM  Result Value Ref Range   Sodium 136 135 - 145 mmol/L   Potassium 4.0 3.5 - 5.1 mmol/L   Chloride 109 98 - 111 mmol/L   CO2 19 (L) 22 -  32 mmol/L   Glucose, Bld 78 70 - 99 mg/dL    Comment: Glucose reference range applies only to samples taken after fasting for at least 8 hours.   BUN 11 8 - 23 mg/dL   Creatinine, Ser 0.53 0.44 - 1.00 mg/dL   Calcium 8.2 (L) 8.9 - 10.3 mg/dL   GFR calc non Af Amer >60 >60 mL/min   GFR calc Af Amer >60 >60 mL/min   Anion gap 8 5 - 15    Comment:  Performed at [facility], [place] [address], [place], [place] [address]  CBC     Status: Abnormal   Collection Time: 12/28/19  6:08 AM  Result Value Ref Range   WBC 9.5 4.0 - 10.5 K/uL   RBC 3.24 (L) 3.87 - 5.11 MIL/uL   Hemoglobin 9.5 (L) 12.0 - 15.0 g/dL   HCT 29.7 (L) 36.0 - 46.0 %   MCV 91.7 80.0 - 100.0 fL   MCH 29.3 26.0 - 34.0 pg   MCHC 32.0 30.0 - 36.0 g/dL   RDW 12.9 11.5 - 15.5 %   Platelets 301 150 - 400 K/uL   nRBC 0.0 0.0 - 0.2 %    Comment: Performed at [facility], [place] [address], [place], [place] [address]   Recent Results (from the past 240 hour(s))  Urine culture     Status: Abnormal (Preliminary result)   Collection Time: 12/27/19  4:12 AM   Specimen: Urine, Clean Catch  Result Value Ref Range Status   Specimen Description   Final    URINE, CLEAN CATCH Performed at [facility], [place] [address], [place], [place] [address]    Special Requests   Final    NONE Performed at [facility], [place] [address], [place], [place] [address]    Culture >=100,000 COLONIES/mL ENTEROCOCCUS FAECALIS (A)  Final   Report Status PENDING  Incomplete  Blood culture (routine x 2)     Status: None (Preliminary result)   Collection Time: 12/27/19  4:12 AM   Specimen: BLOOD  Result Value Ref Range Status   Specimen Description   Final    BLOOD BLOOD RIGHT HAND Performed at [place] [address] [place], [place] [address]    Special Requests   Final    BOTTLES DRAWN AEROBIC AND ANAEROBIC Blood Culture adequate volume Performed at [place] [address] [place], [place] [address]    Culture   Final    NO GROWTH 1 DAY Performed at [facility], [place] [address], [place],  [address]    Report Status PENDING  Incomplete  SARS CORONAVIRUS 2 (TAT 6-24 HRS) Nasopharyngeal Nasopharyngeal Swab     Status: None   Collection Time: 12/27/19  6:08 AM    Specimen: Nasopharyngeal Swab  Result Value Ref Range Status   SARS Coronavirus 2 NEGATIVE NEGATIVE Final    Comment: (NOTE) SARS-CoV-2 target nucleic acids are NOT DETECTED. The SARS-CoV-2 RNA is generally detectable in upper and lower respiratory specimens during the acute phase of infection. Negative results do not preclude SARS-CoV-2 infection, do not rule out co-infections with other pathogens, and should not be used as the sole basis for treatment or other patient management decisions. Negative results must be combined with clinical observations, patient history, and epidemiological information. The expected result is Negative. Fact Sheet for Patients: SugarRoll.be Fact Sheet for Healthcare Providers: https://www.woods-mathews.com/ This test is not yet approved or cleared  by the [place] and  has been authorized for detection and/or diagnosis of SARS-CoV-2 by FDA under an Emergency Use Authorization (EUA). This EUA will remain  in effect (meaning this test can be used) for the duration of the COVID-19 declaration under Section 56 4(b)(1) of the Act, 21 U.S.C. section 360bbb-3(b)(1), unless the authorization is terminated or revoked sooner. Performed at [facility], [place] [address], [place], [place] [address]   Blood culture (routine x 2)     Status: None (Preliminary result)   Collection Time: 12/27/19 11:57 AM   Specimen: BLOOD  Result Value Ref Range Status   Specimen Description   Final    BLOOD BLOOD LEFT FOREARM Performed at [place] [address], [place], [place] [address]    Special Requests   Final    BOTTLES DRAWN AEROBIC ONLY Blood Culture results may not be optimal due to an inadequate volume of blood received in culture bottles Performed at [place] [address], [place], [place] [address]    Culture   Final    NO GROWTH < 24 HOURS Performed at [place] [address] [place], [place] [address]    Report Status PENDING  Incomplete   Creatinine: Recent Labs    12/27/19 0412 12/28/19 0608  CREATININE 0.56 0.53   Baseline Creatinine: 0.5  Impression/Assessment:  78yo with urinary retention and UTI  Plan:  1. Urinary retention: Please change foley catheter since it has been over 1 month. Please start flomax 0.4mg  daily and the patient should followup 1 week after discharge for voiding trial at [facility] urology.  2. UTI: Please continue broad spectrum antibiotics pending urine culture  Nicolette Bang 12/28/2019, 4:13 PM

## 2019-12-29 DIAGNOSIS — Z515 Encounter for palliative care: Secondary | ICD-10-CM

## 2019-12-29 LAB — URINE CULTURE: Culture: >=100000 — AB

## 2019-12-29 LAB — CBC
HCT: 31.6 % — ABNORMAL LOW (ref 36.0–46.0)
Hemoglobin: 9.9 g/dL — ABNORMAL LOW (ref 12.0–15.0)
MCH: 28.4 pg (ref 26.0–34.0)
MCHC: 31.3 g/dL (ref 30.0–36.0)
MCV: 90.5 fL (ref 80.0–100.0)
Platelets: 300 10*3/uL (ref 150–400)
RBC: 3.49 MIL/uL — ABNORMAL LOW (ref 3.87–5.11)
RDW: 12.5 % (ref 11.5–15.5)
WBC: 8.1 10*3/uL (ref 4.0–10.5)
nRBC: 0 % (ref 0.0–0.2)

## 2019-12-29 LAB — BASIC METABOLIC PANEL
Anion gap: 9 (ref 5–15)
BUN: 8 mg/dL (ref 8–23)
CO2: 23 mmol/L (ref 22–32)
Calcium: 8.4 mg/dL — ABNORMAL LOW (ref 8.9–10.3)
Chloride: 109 mmol/L (ref 98–111)
Creatinine, Ser: 0.49 mg/dL (ref 0.44–1.00)
GFR calc Af Amer: >60 mL/min (ref >60)
GFR calc non Af Amer: >60 mL/min (ref >60)
Glucose, Bld: 82 mg/dL (ref 70–99)
Potassium: 3.5 mmol/L (ref 3.5–5.1)
Sodium: 141 mmol/L (ref 135–145)

## 2019-12-29 MED ORDER — TAMSULOSIN HCL 0.4 MG PO CAPS
0.4000 mg | ORAL_CAPSULE | Freq: Every day | ORAL | Status: DC
  Administered 2019-12-29: 0.4 mg via ORAL
  Filled 2019-12-29 (×2): qty 1

## 2019-12-29 MED ORDER — VANCOMYCIN HCL 750 MG/150ML IV SOLN
750.0000 mg | INTRAVENOUS | Status: DC
  Administered 2019-12-29 – 2019-12-31 (×3): 750 mg via INTRAVENOUS
  Filled 2019-12-29 (×3): qty 150

## 2019-12-29 MED ORDER — ACETAMINOPHEN 325 MG PO TABS
650.0000 mg | ORAL_TABLET | Freq: Three times a day (TID) | ORAL | Status: DC
  Administered 2019-12-29 – 2019-12-31 (×3): 650 mg via ORAL
  Filled 2019-12-29 (×4): qty 2

## 2019-12-29 MED ORDER — OXYCODONE HCL 5 MG PO TABS: 5.0000 mg | ORAL_TABLET | Freq: Four times a day (QID) | ORAL | Status: DC | PRN

## 2019-12-29 NOTE — Evaluation (Signed)
Physical Therapy Evaluation Patient Details Name: Tammy Arnold MRN: [id number] DOB: 1941/08/25 Today's Date: 12/29/2019   History of Present Illness  79yo female presenting to the ED with AMS. Note history of Covid January 2021 followed by stay at [facility] for rehab, also indwelling catheter placed 11/23/19. Admitted for catheter associated UTI with metabolic encephalopathy. PMH dementia, CVA, HTN, HLD, TKR, covid  Clinical Impression   Patient received in bed, awake but not even oriented to self, unable to state where she is or even the name of her husband. Often states "yeah" or shakes head no. Requires increased time for processing and heavy cues for initiation, even then simple cue following is very inconsistent. Attempted mobility however she repeatedly said "no!"/shook her head and physically resisted even low level activities today. Will need +2 to progress mobility safely. Unsure of true prior levels as family was not present to clarify. She was left in bed with all needs met, bed alarm active. Will need SNF unless family is able to provide an appropriate level of physical assist and 24/7 care.   Patient suffers from muscle weakness, impaired balance, and impaired cognition which impairs their ability to perform daily activities like safely ambulate in the home.  A walker alone will not resolve the issues with performing activities of daily living. A wheelchair will allow patient to safely perform daily activities.  The patient can self propel in the home or has a caregiver who can provide assistance.        Follow Up Recommendations SNF;Supervision/Assistance - 24 hour(unless family can provide appropriate amounts of assistance)    Equipment Recommendations  Wheelchair (measurements PT);Wheelchair cushion (measurements PT);Hospital bed    Recommendations for Other Services       Precautions / Restrictions Precautions Precautions: Fall;Other (comment) Precaution Comments:  confusion, can be agitated Restrictions Weight Bearing Restrictions: No      Mobility  Bed Mobility               General bed mobility comments: attempted- refused and physically resisted  Transfers                 General transfer comment: attempted- refused and physically resisted  Ambulation/Gait             General Gait Details: non-ambulatory per chart  Stairs            Wheelchair Mobility    Modified Rankin (Stroke Patients Only)       Balance                                             Pertinent Vitals/Pain Pain Assessment: Faces Pain Score: 0-No pain Faces Pain Scale: No hurt Pain Intervention(s): Limited activity within patient's tolerance;Monitored during session    Home Living Family/patient expects to be discharged to:: Private residence Living Arrangements: Spouse/significant other Available Help at Discharge: Family;Available PRN/intermittently Type of Home: House Home Access: Stairs to enter Entrance Stairs-Rails: None Entrance Stairs-Number of Steps: several per prior charting Home Layout: One level Home Equipment: None Additional Comments: all information taken from prior charting, patient unable to provide    Prior Function Level of Independence: Needs assistance   Gait / Transfers Assistance Needed: hx of community/distance ambulation but chart states she has been recently non-ambulatory  ADL's / Ransom Needed: totalA per chart, history of independence with bathing/dressing  Hand Dominance   Dominant Hand: Right    Extremity/Trunk Assessment   Upper Extremity Assessment Upper Extremity Assessment: Difficult to assess due to impaired cognition(unable to follow cues and physically resistant)    Lower Extremity Assessment Lower Extremity Assessment: Difficult to assess due to impaired cognition(unable to follow cues and physically resistant)    Cervical / Trunk  Assessment Cervical / Trunk Assessment: Normal  Communication   Communication: Expressive difficulties(word retrieval- possibly due to cognition)  Cognition Arousal/Alertness: Awake/alert Behavior During Therapy: Flat affect;Restless Overall Cognitive Status: No family/caregiver present to determine baseline cognitive functioning Area of Impairment: Orientation;Attention;Memory;Following commands;Safety/judgement;Awareness;Problem solving                 Orientation Level: Disoriented to;Person;Place;Time;Situation Current Attention Level: Focused Memory: Decreased short-term memory;Decreased recall of precautions Following Commands: Follows one step commands inconsistently;Follows multi-step commands inconsistently Safety/Judgement: Decreased awareness of safety;Decreased awareness of deficits Awareness: Intellectual Problem Solving: Decreased initiation;Difficulty sequencing;Requires verbal cues;Requires tactile cues;Slow processing General Comments: history of dementia; not even oriented to self at eval, unable to state where she was or the name of her husband. Often gave nonsensical/non-related answers or simply stated yeah or shook head no      General Comments General comments (skin integrity, edema, etc.): unable to get to EOB, unable to assess balance    Exercises     Assessment/Plan    PT Assessment Patient needs continued PT services  PT Problem List Decreased strength;Decreased cognition;Decreased knowledge of use of DME;Decreased activity tolerance;Decreased safety awareness;Decreased balance;Decreased mobility;Decreased coordination       PT Treatment Interventions DME instruction;Balance training;Gait training;Neuromuscular re-education;Stair training;Cognitive remediation;Functional mobility training;Patient/family education;Therapeutic activities;Therapeutic exercise    PT Goals (Current goals can be found in the Care Plan section)  Acute Rehab PT Goals PT  Goal Formulation: Patient unable to participate in goal setting Time For Goal Achievement: 01/12/20 Potential to Achieve Goals: Fair    Frequency Min 2X/week   Barriers to discharge        Co-evaluation               AM-PAC PT "6 Clicks" Mobility  Outcome Measure Help needed turning from your back to your side while in a flat bed without using bedrails?: A Lot Help needed moving from lying on your back to sitting on the side of a flat bed without using bedrails?: Total Help needed moving to and from a bed to a chair (including a wheelchair)?: Total Help needed standing up from a chair using your arms (e.g., wheelchair or bedside chair)?: Total Help needed to walk in hospital room?: Total Help needed climbing 3-5 steps with a railing? : Total 6 Click Score: 7    End of Session   Activity Tolerance: Other (comment)(limited by cognition and physically refusing/resisting mobility) Patient left: in bed;with call bell/phone within reach;with bed alarm set   PT Visit Diagnosis: Unsteadiness on feet (R26.81);Muscle weakness (generalized) (M62.81);Other abnormalities of gait and mobility (R26.89)    Time: 1062-6948 PT Time Calculation (min) (ACUTE ONLY): 10 min   Charges:   PT Evaluation $PT Eval Moderate Complexity: 1 Mod          Windell Norfolk, DPT, PN1   Supplemental Physical Therapist [place]    Pager [phone] Acute Rehab Office [phone]

## 2019-12-29 NOTE — Consult Note (Signed)
Consultation Note Date: 12/29/2019   Patient Name: Tammy Arnold  DOB: Jan 27, 1941  MRN: [id number]  Age / Sex: 79 y.o., female  PCP: Kelton Pillar, MD Referring Physician: Patrecia Pour, MD  Reason for Consultation: Establishing goals of care and Psychosocial/spiritual support  HPI/Patient Profile: 79 y.o. female  with past medical history of dementia, CVA, arthritis, severe spinal stenosis s/p epidural injections x 2, indwelling catheter (1/29) and recent COVID 19 (1/21) who was admitted on 12/27/2019 with altered mental status and UTI.   Clinical Assessment and Goals of Care:  I have reviewed medical records including EPIC notes, labs and imaging, received report from the care team, examined the patient and met at bedside with her Tammy Dr. Newton Pigg to discuss diagnosis prognosis, [place], EOL wishes, disposition and options.  I introduced Palliative Medicine as specialized medical care for people living with serious illness. It focuses on providing relief from the symptoms and stress of a serious illness.   We discussed a brief life review of the patient.  Tammy Arnold describes his wife as a very independent woman.  The two of them have 3 adult children.  Tammy Arnold Arnold to play bridge, play the piano and read.  Approximately two years ago she had a change in mental status and was diagnosed with dementia.  She has lived at home.  Her Tammy is her primary care taker.    As far as functional and nutritional status at the beginning of January the patient was able to walk at least 100' independently and she was continent of bowel and bladder.  She had COVID and was sent to [facility].  She recently returned home from [place].  She had a pressure wound on her sacrum, was unable to walk, and was incontinent of bowel and bladder.  Her back pain (spinal stenosis) appeared to have become much worse.  We discussed her  current illness and what it means in the larger context of her on-going co-morbidities.  Natural disease trajectory and expectations at EOL were discussed.  Discussed dementia.  We reviewed the FAST Scale progression.  If she remains unable to walk she is approximately a FAST 7C/D.   Discussed recurrent UTIs particularly due to catheter use.  Tammy Arnold questioned whether or not a suprapubic catheter may help reduce recurrent infections.  I attempted to elicit values and goals of care important to the patient.  Tammy Arnold described Tammy Arnold as a very independent woman who would not have been pleased with her current dependent condition.     The difference between aggressive medical intervention and comfort care was considered in light of the patient's goals of care.  We discussed code status.  At this point Tammy Arnold still has hope that with pain control his wife will be able to walk again and have quality of life.  Consequently he is not ready to change her code status.  Hospice and Palliative Care services outpatient were explained and offered.  Tammy Arnold reached out to [facility] Palliative services last  week.  He would appreciate confirming a referral to [facility] palliative.  Questions and concerns were addressed.  The family was encouraged to call with questions or concerns.    Primary Decision Maker:  HCPOA Tammy Arnold    SUMMARY OF RECOMMENDATIONS     Given patient's agitation and dementia please allow Tammy to have unrestricted visitation (24 hours a day) this is in accordance with our visitor policy and will help keep the patient safe.  Tammy requests Hospital bed in the home.  Will need mattress overlay as patient has had pressure wound.  Please have this in place prior to discharge.    Please have patient followed at home by [facility] Palliative.  Tammy called them last week, but would appreciate our team sending a referral as well.  Code Status/Advance Care  Planning:  Continue full code.  Tammy feels she may be able to regain her ability to walk if we can treat her pain.    Living Will not discussed during this session but would be appropriate for a follow up session.   Symptom Management:   Pain: start scheduled tylenol and PRN oxy IR.    Consider inpatient evaluation for epidural injections.  Patient has received these 2x in the past and it has allowed her to walk.  Patient appears to have severe pain with any movement of her legs or thorax.  Tammy Arnold was opposed to any medications that may effect her ability to think.  If not for his concern I would have recommended low dose Olanzapine 2.5 mg qhs for agitation.  However I will defer this suggestion.   Additional Recommendations (Limitations, Scope, Preferences):  Full Scope Treatment  Palliative Prophylaxis:   Delirium Protocol and Frequent Pain Assessment  Psycho-social/Spiritual:   Desire for further Chaplaincy support: not discussed.  Prognosis:  Unable to determine   Discharge Planning: Home with Home Health and Palliative Services      Primary Diagnoses: Present on Admission: . Acute metabolic encephalopathy . Small vessel disease, cerebrovascular . HLD (hyperlipidemia) . Cognitive impairment . Essential hypertension   I have reviewed the medical record, interviewed the patient and family, and examined the patient. The following aspects are pertinent.  Past Medical History:  Diagnosis Date  . Arthritis   . High cholesterol   . Hypertension   . Stroke [facility])    Social History   Socioeconomic History  . Marital status: Married    Spouse name: Not on file  . Number of children: Not on file  . Years of education: Not on file  . Highest education level: Not on file  Occupational History  . Not on file  Tobacco Use  . Smoking status: Never Smoker  . Smokeless tobacco: Never Used  . Tobacco comment: smoked very little in college none since    Substance and Sexual Activity  . Alcohol use: No    Alcohol/week: 0.0 standard drinks  . Drug use: Not Currently  . Sexual activity: Not on file  Other Topics Concern  . Not on file  Social History Narrative  . Not on file   Social Determinants of Health   Financial Resource Strain:   . Difficulty of Paying Living Expenses: Not on file  Food Insecurity:   . Worried About Charity fundraiser in the Last Year: Not on file  . Ran Out of Food in the Last Year: Not on file  Transportation Needs:   . Lack of Transportation (Medical): Not on file  .  Lack of Transportation (Non-Medical): Not on file  Physical Activity:   . Days of Exercise per Week: Not on file  . Minutes of Exercise per Session: Not on file  Stress:   . Feeling of Stress : Not on file  Social Connections:   . Frequency of Communication with Friends and Family: Not on file  . Frequency of Social Gatherings with Friends and Family: Not on file  . Attends Religious Services: Not on file  . Active Member of Clubs or Organizations: Not on file  . Attends Archivist Meetings: Not on file  . Marital Status: Not on file   Family History  Problem Relation Age of Onset  . Dementia Mother    Scheduled Meds: . acetaminophen  650 mg Oral TID  . Chlorhexidine Gluconate Cloth  6 each Topical Daily  . donepezil  10 mg Oral QHS  . enoxaparin (LOVENOX) injection  40 mg Subcutaneous Q24H  . feeding supplement (ENSURE ENLIVE)  237 mL Oral TID BM  . feeding supplement (PRO-STAT SUGAR FREE 64)  30 mL Oral BID  . lisinopril  2.5 mg Oral Daily  . memantine  10 mg Oral BID  . pravastatin  20 mg Oral q1800  . tamsulosin  0.4 mg Oral QPC breakfast   Continuous Infusions: . sodium chloride 75 mL/hr at 12/29/19 0532  . vancomycin 750 mg (12/29/19 1234)   PRN Meds:.acetaminophen, albuterol, diphenhydrAMINE, guaiFENesin-dextromethorphan, ondansetron **OR** ondansetron (ZOFRAN) IV, oxyCODONE, QUEtiapine Allergies   Allergen Reactions  . Baclofen Other (See Comments)    Caused brain dysfunction  . Keflex [Cephalexin] Rash  . Macrodantin [Nitrofurantoin] Rash  . Neggram [Nalidixic Acid] Rash  . Nitrofurantoin Monohyd Macro Rash  . Penicillins Rash    Has patient had a PCN reaction causing immediate rash, facial/tongue/throat swelling, SOB or lightheadedness with hypotension: Yes Has patient had a PCN reaction causing severe rash involving mucus membranes or skin necrosis: No Has patient had a PCN reaction that required hospitalization: No Has patient had a PCN reaction occurring within the last 10 years: No If all of the above answers are "NO", then may proceed with Cephalosporin use.  . Sulfa Antibiotics Rash    Review of Systems Patient unable to provide.  Physical Exam  Well developed female, awake, alert, agitated and uncomfortable. CV rrr no frank m/r/g Resp no distress.  No w/c/r Abdomen soft, nt, nd   Vital Signs: BP 132/73   Pulse 94   Temp 98 F (36.7 C)   Resp 14   Ht '5\' 4"'  (1.626 m)   Wt 61.4 kg   SpO2 96%   BMI 23.23 kg/m  Pain Scale: Faces   Pain Score: Asleep   SpO2: SpO2: 96 % O2 Device:SpO2: 96 % O2 Flow Rate: .   IO: Intake/output summary:   Intake/Output Summary (Last 24 hours) at 12/29/2019 1612 Last data filed at 12/29/2019 1000 Gross per 24 hour  Intake 1053.65 ml  Output 2275 ml  Net -1221.35 ml    LBM:   Baseline Weight: Weight: 61.4 kg Most recent weight: Weight: 61.4 kg     Palliative Assessment/Data: 30%     Time In: 4:00 Time Out: 5:10 Time Total: 70 min. Visit consisted of counseling and education dealing with the complex and emotionally intense issues surrounding the need for palliative care and symptom management in the setting of serious and potentially life-threatening illness. Greater than 50%  of this time was spent counseling and coordinating care related to the above  assessment and plan.  Signed by: Florentina Jenny,  PA-C Palliative Medicine  Please contact Palliative Medicine Team phone at [phone] for questions and concerns.  For individual provider: See Shea Evans

## 2019-12-29 NOTE — Progress Notes (Signed)
Pharmacy Antibiotic Note  Tammy Arnold is a 79 y.o. female with hx foley cath and dementia presented to the ED on n 12/27/2019 with AMS.  Pharmacy has been consulted to start vancomycin after UCx grew E. Faecalis.  Today, 12/29/19 - Tmax 99.5, WBC 8.1 - scr 0.49 - keflex and PCN listed in allergies section (rxn = rash). No reactions documented with meropenem dose given at ~6a this morning  Microbiology results:  3/4 at 0412 BCx: NGTD 3/4 at 1157 bcx: NGTD 3/4 UCx: E. Faecalis ( s to ampicilin, nitrofurantoin, vancomycin)  3/4 UA: many bacteria, >50, large leuk, pos nitrite  Plan:  Start vancomycin 750 mg IV q24h  Monitor clinical course, renal function, cultures as available   _______________________________________  Temp (24hrs), Avg:98.4 F (36.9 C), Min:98.2 F (36.8 C), Max:98.5 F (36.9 C)  Recent Labs  Lab 12/27/19 0412 12/28/19 0608 12/29/19 0602  WBC 12.7* 9.5 8.1  CREATININE 0.56 0.53 0.49  LATICACIDVEN 1.0  --   --     Estimated Creatinine Clearance: 50.9 mL/min (by C-G formula based on SCr of 0.49 mg/dL).    Allergies  Allergen Reactions  . Baclofen Other (See Comments)    Caused brain dysfunction  . Keflex [Cephalexin] Rash  . Macrodantin [Nitrofurantoin] Rash  . Neggram [Nalidixic Acid] Rash  . Nitrofurantoin Monohyd Macro Rash  . Penicillins Rash    Has patient had a PCN reaction causing immediate rash, facial/tongue/throat swelling, SOB or lightheadedness with hypotension: Yes Has patient had a PCN reaction causing severe rash involving mucus membranes or skin necrosis: No Has patient had a PCN reaction that required hospitalization: No Has patient had a PCN reaction occurring within the last 10 years: No If all of the above answers are "NO", then may proceed with Cephalosporin use.  . Sulfa Antibiotics Rash     Thank you for allowing pharmacy to be a part of this patient's care.   Royetta Asal, PharmD, BCPS 12/29/2019 11:49 AM

## 2019-12-29 NOTE — Progress Notes (Signed)
PROGRESS NOTE  Tammy Arnold  [id number] DOB: 1941/02/23 DOA: 12/27/2019 PCP: Kelton Pillar, MD  Brief Narrative: Tammy Arnold is a 79 y.o. female with a history of dementia, HTN, HLD, cerebrovascular disease without known stroke, urinary retention s/p foley, and spinal stenosis with debility who presented from home with altered mental status. She had been hospitalized for covid-19 pneumonia in Jan 2021, discharged to [facility], and subsequently sent home. Over the past several days she had become more listless and having poor oral intake, missing medications, ultimately becoming agitated prompting ED visit 3/4. In the ED, she was afebrile with neutrophilic leukocytosis at [id number] and grossly positive urinalysis. Due to allergy profile, meropenem was started and the patient admitted to [facility]. E. faecalis has grown in urine culture, so antibiotics were changed to vancomycin.   Assessment & Plan: Principal Problem:   Acute metabolic encephalopathy Active Problems:   Essential hypertension   HLD (hyperlipidemia)   Cognitive impairment   Small vessel disease, cerebrovascular   Acute lower UTI   Foley catheter in place on admission  CAUTI: Enterococcus faecalis on culture, blood cultures NGTD.  - Change meropenem to vancomycin despite susceptibility profile (non-VRE) due to allergies and incomplete coverage by penems.  Acute metabolic encephalopathy on chronic dementia: CT head without acute hemorrhage, etc. There were low-attenuation changes in the deep white matter consistent with small vessel ischemia and age-consistent atrophy. - Treat UTI as above.  - Delirium precautions - Continue aricept (monitor for bradycardia which was reason for recent discontinuation), namenda, prn seroquel  HTN:  - Continue lisinopril  HLD:  - Continue statin  Spinal stenosis:  - Follow up with orthopedics, Dr. Nelva Bush, for ongoing management, has had epidural injections in the past.  - Continue pain  control  Anemia of chronic disease: Suspected etiology of normocytic/normochromic anemia in absence of acute bleeding. Hgb declined along with other cell lines consistent with hemodilution.  - Monitor closely. Ok to continue DVT ppx  DVT prophylaxis: Lovenox Code Status: Full Family Communication: Husband by phone today Disposition Plan: Patient from home, remains with altered mental status undergoing treatment for infection. Continue PT, OT. Possible discharge 8/8.  Consultants:   Palliative care medicine team  Procedures:   Foley catheter exchange 12/27/2019  Antimicrobials:  Meropenem 3/4 - 3/6  Vancomycin   Subjective: Remains confused but more amenable. Says she's hungry and is ready for breakfast. No fevers, denies pain anywhere.   Objective: Vitals:   12/28/19 1426 12/28/19 2008 12/29/19 0502 12/29/19 0936  BP: 140/80 (!) 160/60 (!) 152/58 (!) 147/66  Pulse: 80 93 87   Resp: 16 18 17    Temp: 98.4 F (36.9 C) 98.5 F (36.9 C) 98.2 F (36.8 C)   TempSrc: Oral Oral Oral   SpO2: 95% 98% 97%   Weight:      Height:        Intake/Output Summary (Last 24 hours) at 12/29/2019 1120 Last data filed at 12/29/2019 1000 Gross per 24 hour  Intake 1053.65 ml  Output 2275 ml  Net -1221.35 ml   Filed Weights   12/27/19 0855  Weight: 61.4 kg   Gen: Older female in no distress Pulm: Nonlabored breathing room air. Clear. CV: Regular rate and rhythm. No murmur, rub, or gallop. No JVD, no dependent edema. GI: Abdomen soft, non-tender, non-distended, with normoactive bowel sounds.  Ext: Warm, no deformities Skin: No rashes, lesions or ulcers on visualized skin. Neuro: Alert and disoriented. Moving all extremities Psych: Judgement and insight appear  impaired. Mood euthymic & affect congruent.   Data Reviewed: I have personally reviewed following labs and imaging studies  CBC: Recent Labs  Lab 12/27/19 0412 12/28/19 0608 12/29/19 0602  WBC 12.7* 9.5 8.1  NEUTROABS 8.6*   --   --   HGB 11.4* 9.5* 9.9*  HCT 35.6* 29.7* 31.6*  MCV 89.9 91.7 90.5  PLT 344 301 [id number]   Basic Metabolic Panel: Recent Labs  Lab 12/27/19 0412 12/28/19 0608 12/29/19 0602  NA 136 136 141  K 3.8 4.0 3.5  CL 105 109 109  CO2 21* 19* 23  GLUCOSE 116* 78 82  BUN 14 11 8   CREATININE 0.56 0.53 0.49  CALCIUM 8.6* 8.2* 8.4*   GFR: Estimated Creatinine Clearance: 50.9 mL/min (by C-G formula based on SCr of 0.49 mg/dL). Liver Function Tests: Recent Labs  Lab 12/27/19 0412  AST 23  ALT 15  ALKPHOS 58  BILITOT 0.8  PROT 7.0  ALBUMIN 2.9*   No results for input(s): LIPASE, AMYLASE in the last 168 hours. No results for input(s): AMMONIA in the last 168 hours. Coagulation Profile: No results for input(s): INR, PROTIME in the last 168 hours. Cardiac Enzymes: No results for input(s): CKTOTAL, CKMB, CKMBINDEX, TROPONINI in the last 168 hours. BNP (last 3 results) No results for input(s): PROBNP in the last 8760 hours. HbA1C: No results for input(s): HGBA1C in the last 72 hours. CBG: No results for input(s): GLUCAP in the last 168 hours. Lipid Profile: No results for input(s): CHOL, HDL, LDLCALC, TRIG, CHOLHDL, LDLDIRECT in the last 72 hours. Thyroid Function Tests: No results for input(s): TSH, T4TOTAL, FREET4, T3FREE, THYROIDAB in the last 72 hours. Anemia Panel: No results for input(s): VITAMINB12, FOLATE, FERRITIN, TIBC, IRON, RETICCTPCT in the last 72 hours. Urine analysis:    Component Value Date/Time   COLORURINE AMBER (A) 12/27/2019 0412   APPEARANCEUR CLOUDY (A) 12/27/2019 0412   LABSPEC 1.024 12/27/2019 0412   PHURINE 6.0 12/27/2019 0412   GLUCOSEU NEGATIVE 12/27/2019 0412   HGBUR NEGATIVE 12/27/2019 0412   BILIRUBINUR NEGATIVE 12/27/2019 0412   KETONESUR NEGATIVE 12/27/2019 0412   PROTEINUR 100 (A) 12/27/2019 0412   UROBILINOGEN 0.2 08/25/2007 0835   NITRITE POSITIVE (A) 12/27/2019 0412   LEUKOCYTESUR LARGE (A) 12/27/2019 0412   Recent Results (from the  past 240 hour(s))  Urine culture     Status: Abnormal   Collection Time: 12/27/19  4:12 AM   Specimen: Urine, Clean Catch  Result Value Ref Range Status   Specimen Description   Final    URINE, CLEAN CATCH Performed at [facility], [place] [address] [place], [place] [address]    Special Requests   Final    NONE Performed at [facility], [place] [address] [place], [place] [address]    Culture >=100,000 COLONIES/mL ENTEROCOCCUS FAECALIS (A)  Final   Report Status 12/29/2019 FINAL  Final   Organism ID, Bacteria ENTEROCOCCUS FAECALIS (A)  Final      Susceptibility   Enterococcus faecalis - MIC*    AMPICILLIN <=2 SENSITIVE Sensitive     NITROFURANTOIN <=16 SENSITIVE Sensitive     VANCOMYCIN 1 SENSITIVE Sensitive     * >=100,000 COLONIES/mL ENTEROCOCCUS FAECALIS  Blood culture (routine x 2)     Status: None (Preliminary result)   Collection Time: 12/27/19  4:12 AM   Specimen: BLOOD  Result Value Ref Range Status   Specimen Description   Final    BLOOD BLOOD RIGHT HAND Performed at [facility]  [place] [address] [place], [place] [address]    Special Requests   Final    BOTTLES DRAWN AEROBIC AND ANAEROBIC Blood Culture adequate volume Performed at [place] [address], [place], [place] [address]    Culture   Final    NO GROWTH 1 DAY Performed at [facility], [place] [address], [place], [place] [address]    Report Status PENDING  Incomplete  SARS CORONAVIRUS 2 (TAT 6-24 HRS) Nasopharyngeal Nasopharyngeal Swab     Status: None   Collection Time: 12/27/19  6:08 AM   Specimen: Nasopharyngeal Swab  Result Value Ref Range Status   SARS Coronavirus 2 NEGATIVE NEGATIVE Final    Comment: (NOTE) SARS-CoV-2 target nucleic acids are NOT DETECTED. The SARS-CoV-2 RNA is generally detectable in upper and lower respiratory specimens during the acute phase of infection. Negative results do not preclude  SARS-CoV-2 infection, do not rule out co-infections with other pathogens, and should not be used as the sole basis for treatment or other patient management decisions. Negative results must be combined with clinical observations, patient history, and epidemiological information. The expected result is Negative. Fact Sheet for Patients: SugarRoll.be Fact Sheet for Healthcare Providers: https://www.woods-mathews.com/ This test is not yet approved or cleared by the [place] FDA and  has been authorized for detection and/or diagnosis of SARS-CoV-2 by FDA under an Emergency Use Authorization (EUA). This EUA will remain  in effect (meaning this test can be used) for the duration of the COVID-19 declaration under Section 56 4(b)(1) of the Act, 21 U.S.C. section 360bbb-3(b)(1), unless the authorization is terminated or revoked sooner. Performed at [facility], [place] [address] [place], [place] [address]   Blood culture (routine x 2)     Status: None (Preliminary result)   Collection Time: 12/27/19 11:57 AM   Specimen: BLOOD  Result Value Ref Range Status   Specimen Description   Final    BLOOD BLOOD LEFT FOREARM Performed at [place] [address], [place], [place] [address]    Special Requests   Final    BOTTLES DRAWN AEROBIC ONLY Blood Culture results may not be optimal due to an inadequate volume of blood received in culture bottles Performed at [place] [address] [place], [place] [address]    Culture   Final    NO GROWTH < 24 HOURS Performed at [place] [address], [place], [place] [address]    Report Status PENDING  Incomplete      Radiology Studies: No results found.  Scheduled Meds: . Chlorhexidine Gluconate Cloth  6 each Topical Daily  . donepezil  10 mg Oral QHS  . enoxaparin (LOVENOX) injection  40 mg Subcutaneous Q24H  . feeding supplement (ENSURE  ENLIVE)  237 mL Oral TID BM  . feeding supplement (PRO-STAT SUGAR FREE 64)  30 mL Oral BID  . lisinopril  2.5 mg Oral Daily  . memantine  10 mg Oral BID  . pravastatin  20 mg Oral q1800  . tamsulosin  0.4 mg Oral QPC breakfast   Continuous Infusions: . sodium chloride 75 mL/hr at 12/29/19 0532  . meropenem (MERREM) IV 1 g (12/29/19 0533)     LOS: 2 days   Time spent: 25 minutes.  Patrecia Pour, MD [facility] [email or web address] 12/29/2019, 11:20 AM

## 2019-12-30 NOTE — Progress Notes (Signed)
Pt is refusing to take any PO medications and refusing all nursing care at this time. RN asked pt if she would take some medication for agitation and delirium and she refused. Paged MD. Will continue to monitor.

## 2019-12-30 NOTE — Progress Notes (Signed)
Pt disoriented X 4 yelling and combative when changing BM episode. Swinging at staff. Pt not compliant with meds, closing mouth. Unable to give PRN Seroquel. Educated pt and attempted to redirect pt and educated on deep breathing. Will continue to monitor.

## 2019-12-30 NOTE — Progress Notes (Signed)
PROGRESS NOTE  Tammy Arnold  [id number] DOB: [birth year] DOA: 12/27/2019 PCP: Kelton Pillar, MD  Brief Narrative: Tammy Arnold is a 79 y.o. female with a history of dementia, HTN, HLD, cerebrovascular disease without known stroke, urinary retention s/p foley, and spinal stenosis with debility who presented from home with altered mental status. She had been hospitalized for covid-19 pneumonia in Jan 2021, discharged to [facility], and subsequently sent home. Over the past several days she had become more listless and having poor oral intake, missing medications, ultimately becoming agitated prompting ED visit 3/4. In the ED, she was afebrile with neutrophilic leukocytosis at [id number] and grossly positive urinalysis. Due to allergy profile, meropenem was started and the patient admitted to [facility]. Tammy Arnold has grown in urine culture, so antibiotics were changed to vancomycin.   Assessment & Plan: Principal Problem:   Acute metabolic encephalopathy Active Problems:   Essential hypertension   HLD (hyperlipidemia)   Cognitive impairment   Small vessel disease, cerebrovascular   Acute lower UTI   Foley catheter in place on admission  CAUTI: Enterococcus Arnold on culture, blood cultures NGTD.  - Change meropenem to vancomycin despite susceptibility profile (non-VRE) due to allergies and incomplete coverage by penems. Plan to complete 3 day Tx 3/8.  Acute metabolic encephalopathy on chronic dementia: CT head without acute hemorrhage, etc. There were low-attenuation changes in the deep white matter consistent with small vessel ischemia and age-consistent atrophy. - Treat UTI as above.  - Delirium precautions - Continue aricept (monitor for bradycardia which was reason for recent discontinuation), namenda, prn seroquel (pt refusing medications)  HTN:  - Continue lisinopril  HLD:  - Continue statin  Spinal stenosis:  - Follow up with orthopedics, Dr. Nelva Bush, for ongoing management,  has had epidural injections in the past. Unable to perform these as inpatient.  - Continue pain control  Anemia of chronic disease: Suspected etiology of normocytic/normochromic anemia in absence of acute bleeding. Hgb declined along with other cell lines consistent with hemodilution and has rebounded. Ok to continue DVT ppx  DVT prophylaxis: Lovenox Code Status: Full Family Communication: Husband by phone daily Disposition Plan: Patient from home, remains with altered mental status undergoing treatment for infection. Continue PT, OT. Anticipate discharge 3/8. Hospital bed ordered.   Consultants:   Palliative care medicine team  Procedures:   Foley catheter exchange 12/27/2019  Antimicrobials:  Meropenem 3/4 - 3/6  Vancomycin 3/6 - 3/8  Subjective: Refusing oral medications, seems more interactive, entirely disoriented.  Objective: Vitals:   12/29/19 1432 12/29/19 2014 12/30/19 0519 12/30/19 1418  BP: 132/73 128/69 (!) 141/65 119/72  Pulse: 94 80 83 87  Resp: 14 16 16 14   Temp: 98 F (36.7 C) 99.4 F (37.4 C) 98.4 F (36.9 C) 99.1 F (37.3 C)  TempSrc:  Oral Oral   SpO2: 96% 98% 98% 99%  Weight:      Height:        Intake/Output Summary (Last 24 hours) at 12/30/2019 1756 Last data filed at 12/30/2019 1650 Gross per 24 hour  Intake 957.04 ml  Output 1800 ml  Net -842.96 ml   Filed Weights   12/27/19 0855  Weight: 61.4 kg   Gen: Older female in no acute distress Pulm: Nonlabored breathing room air. Clear. CV: Regular rate and rhythm. No murmur, rub, or gallop. No JVD, no dependent edema. GI: Abdomen soft, non-tender, non-distended, with normoactive bowel sounds.  Ext: Warm, no deformities Skin: No rashes, lesions or ulcers on visualized skin.  Neuro: Alert, agitated with exam and attempt to reposition.  Psych: Intermittently agitated, anxious  Data Reviewed: I have personally reviewed following labs and imaging studies  CBC: Recent Labs  Lab 12/27/19 0412  12/28/19 0608 12/29/19 0602  WBC 12.7* 9.5 8.1  NEUTROABS 8.6*  --   --   HGB 11.4* 9.5* 9.9*  HCT 35.6* 29.7* 31.6*  MCV 89.9 91.7 90.5  PLT 344 301 [id number]   Basic Metabolic Panel: Recent Labs  Lab 12/27/19 0412 12/28/19 0608 12/29/19 0602  NA 136 136 141  K 3.8 4.0 3.5  CL 105 109 109  CO2 21* 19* 23  GLUCOSE 116* 78 82  BUN 14 11 8   CREATININE 0.56 0.53 0.49  CALCIUM 8.6* 8.2* 8.4*   GFR: Estimated Creatinine Clearance: 50.9 mL/min (by C-G formula based on SCr of 0.49 mg/dL). Liver Function Tests: Recent Labs  Lab 12/27/19 0412  AST 23  ALT 15  ALKPHOS 58  BILITOT 0.8  PROT 7.0  ALBUMIN 2.9*   No results for input(s): LIPASE, AMYLASE in the last 168 hours. No results for input(s): AMMONIA in the last 168 hours. Coagulation Profile: No results for input(s): INR, PROTIME in the last 168 hours. Cardiac Enzymes: No results for input(s): CKTOTAL, CKMB, CKMBINDEX, TROPONINI in the last 168 hours. BNP (last 3 results) No results for input(s): PROBNP in the last 8760 hours. HbA1C: No results for input(s): HGBA1C in the last 72 hours. CBG: No results for input(s): GLUCAP in the last 168 hours. Lipid Profile: No results for input(s): CHOL, HDL, LDLCALC, TRIG, CHOLHDL, LDLDIRECT in the last 72 hours. Thyroid Function Tests: No results for input(s): TSH, T4TOTAL, FREET4, T3FREE, THYROIDAB in the last 72 hours. Anemia Panel: No results for input(s): VITAMINB12, FOLATE, FERRITIN, TIBC, IRON, RETICCTPCT in the last 72 hours. Urine analysis:    Component Value Date/Time   COLORURINE AMBER (A) 12/27/2019 0412   APPEARANCEUR CLOUDY (A) 12/27/2019 0412   LABSPEC 1.024 12/27/2019 0412   PHURINE 6.0 12/27/2019 0412   GLUCOSEU NEGATIVE 12/27/2019 0412   HGBUR NEGATIVE 12/27/2019 0412   BILIRUBINUR NEGATIVE 12/27/2019 0412   KETONESUR NEGATIVE 12/27/2019 0412   PROTEINUR 100 (A) 12/27/2019 0412   UROBILINOGEN 0.2 08/25/2007 0835   NITRITE POSITIVE (A) 12/27/2019 0412    LEUKOCYTESUR LARGE (A) 12/27/2019 0412   Recent Results (from the past 240 hour(s))  Urine culture     Status: Abnormal   Collection Time: 12/27/19  4:12 AM   Specimen: Urine, Clean Catch  Result Value Ref Range Status   Specimen Description   Final    URINE, CLEAN CATCH Performed at [facility], [place] [address], [place], [place] [address]    Special Requests   Final    NONE Performed at [facility], [place] [address] [place], [place] [address]    Culture >=100,000 COLONIES/mL ENTEROCOCCUS Arnold (A)  Final   Report Status 12/29/2019 FINAL  Final   Organism ID, Bacteria ENTEROCOCCUS Arnold (A)  Final      Susceptibility   Enterococcus Arnold - MIC*    AMPICILLIN <=2 SENSITIVE Sensitive     NITROFURANTOIN <=16 SENSITIVE Sensitive     VANCOMYCIN 1 SENSITIVE Sensitive     * >=100,000 COLONIES/mL ENTEROCOCCUS Arnold  Blood culture (routine x 2)     Status: None (Preliminary result)   Collection Time: 12/27/19  4:12 AM   Specimen: BLOOD  Result Value Ref Range Status   Specimen Description   Final  BLOOD BLOOD RIGHT HAND Performed at [place] [address] [place], [place] [address]    Special Requests   Final    BOTTLES DRAWN AEROBIC AND ANAEROBIC Blood Culture adequate volume Performed at [place] [address] [place], [place] [address]    Culture   Final    NO GROWTH 3 DAYS Performed at [facility], [place] [address] [place], [place] [address]    Report Status PENDING  Incomplete  SARS CORONAVIRUS 2 (TAT 6-24 HRS) Nasopharyngeal Nasopharyngeal Swab     Status: None   Collection Time: 12/27/19  6:08 AM   Specimen: Nasopharyngeal Swab  Result Value Ref Range Status   SARS Coronavirus 2 NEGATIVE NEGATIVE Final    Comment: (NOTE) SARS-CoV-2 target nucleic acids are NOT DETECTED. The SARS-CoV-2 RNA is generally detectable in upper and lower respiratory specimens during  the acute phase of infection. Negative results do not preclude SARS-CoV-2 infection, do not rule out co-infections with other pathogens, and should not be used as the sole basis for treatment or other patient management decisions. Negative results must be combined with clinical observations, patient history, and epidemiological information. The expected result is Negative. Fact Sheet for Patients: SugarRoll.be Fact Sheet for Healthcare Providers: https://www.woods-mathews.com/ This test is not yet approved or cleared by the [place] FDA and  has been authorized for detection and/or diagnosis of SARS-CoV-2 by FDA under an Emergency Use Authorization (EUA). This EUA will remain  in effect (meaning this test can be used) for the duration of the COVID-19 declaration under Section 56 4(b)(1) of the Act, 21 U.S.C. section 360bbb-3(b)(1), unless the authorization is terminated or revoked sooner. Performed at [facility], [place] [address], [place], [place] [address]   Blood culture (routine x 2)     Status: None (Preliminary result)   Collection Time: 12/27/19 11:57 AM   Specimen: BLOOD  Result Value Ref Range Status   Specimen Description   Final    BLOOD BLOOD LEFT FOREARM Performed at [place] [address] [place], [place] [address]    Special Requests   Final    BOTTLES DRAWN AEROBIC ONLY Blood Culture results may not be optimal due to an inadequate volume of blood received in culture bottles Performed at [place] [address], [place], [place] [address]    Culture   Final    NO GROWTH 3 DAYS Performed at [facility], [place] [address] [place], [place] [address]    Report Status PENDING  Incomplete      Radiology Studies: No results found.  Scheduled Meds: . acetaminophen  650 mg Oral TID  . Chlorhexidine Gluconate Cloth  6 each Topical Daily  . donepezil  10 mg Oral  QHS  . enoxaparin (LOVENOX) injection  40 mg Subcutaneous Q24H  . feeding supplement (ENSURE ENLIVE)  237 mL Oral TID BM  . feeding supplement (PRO-STAT SUGAR FREE 64)  30 mL Oral BID  . lisinopril  2.5 mg Oral Daily  . memantine  10 mg Oral BID  . pravastatin  20 mg Oral q1800  . tamsulosin  0.4 mg Oral QPC breakfast   Continuous Infusions: . sodium chloride 75 mL/hr at 12/30/19 0636  . vancomycin 750 mg (12/30/19 1120)     LOS: 3 days   Time spent: 25 minutes.  Patrecia Pour, MD [facility] [email or web address] 12/30/2019, 5:56 PM

## 2019-12-31 MED ORDER — TAMSULOSIN HCL 0.4 MG PO CAPS: 0.4000 mg | ORAL_CAPSULE | Freq: Every day | ORAL | 0 refills | Status: AC

## 2019-12-31 NOTE — Care Management Important Message (Signed)
Important Message  Patient Details IM Letter given to Roque Lias SW Case Manager to present to the Patient Name: Tammy Arnold MRN: [id number] Date of Birth: 07-03-1941   Medicare Important Message Given:  Yes     Kerin Salen 12/31/2019, 10:27 AM

## 2019-12-31 NOTE — Clinical Social Work Note (Signed)
    Durable Medical Equipment  (From admission, onward)         Start     Ordered   12/31/19 1125  For home use only DME Hospital bed  Once    Question Answer Comment  Length of Need 6 Months   The above medical condition requires: Patient requires the ability to reposition frequently   Bed type Semi-electric   Support Surface: Gel Overlay      12/31/19 1124

## 2019-12-31 NOTE — Discharge Summary (Signed)
Physician Discharge Summary  Tammy Arnold [id number] DOB: 1941-06-27 DOA: 12/27/2019  PCP: Kelton Pillar, MD  Admit date: 12/27/2019 Discharge date: 12/31/2019  Admitted From: Home Disposition: Home   Recommendations for Outpatient Follow-up:  1. Follow up with PCP in 1-2 weeks 2. Continue palliative care follow up 3. Follow up with orthopedics, Dr. Nelva Bush, for ongoing management, has had epidural injections in the past. Unable to perform this as inpatient.  4. Started flomax 0.4mg  daily. Dr. Alyson Ingles recommended that the patient should followup 1 week after discharge for voiding trial at [facility] urology.   Home Health: Full-time care, palliative care Equipment/Devices: Addition of hospital bed to prior arrangements Discharge Condition: Stable CODE STATUS: Full code Diet recommendation: As tolerated  Brief/Interim Summary: Tammy Arnold is a 79 y.o. female with a history of dementia, HTN, HLD, cerebrovascular disease without known stroke, urinary retention s/p foley, and spinal stenosis with debility who presented from home with altered mental status. She had been hospitalized for covid-19 pneumonia in Jan 2021, discharged to [facility], and subsequently sent home. Over the past several days she had become more listless and having poor oral intake, missing medications, ultimately becoming agitated prompting ED visit 3/4. In the ED, she was afebrile with neutrophilic leukocytosis at 99991111 and grossly positive urinalysis. Due to allergy profile, meropenem was started and the patient admitted to [facility]. E. faecalis has grown in urine culture, so antibiotics were changed to vancomycin with plan for 3 days of treatment in the absence of sepsis with replaced foley catheter.   Discharge Diagnoses:  Principal Problem:   Acute metabolic encephalopathy Active Problems:   Essential hypertension   HLD (hyperlipidemia)   Cognitive impairment   Small vessel disease, cerebrovascular   Acute  lower UTI   Foley catheter in place on admission  CAUTI: Enterococcus faecalis on culture, blood cultures NGTD.  - Change meropenem to vancomycin despite susceptibility profile (non-VRE) due to allergies and incomplete coverage by penems. Plan to complete 3 day Tx 3/8.  Urinary retention:  - Started flomax which we will continue. Foley catheter exchanged at admission. Will need urology follow up  Acute metabolic encephalopathy on chronic dementia: CT head without acute hemorrhage, etc. There were low-attenuation changes in the deep white matter consistent with small vessel ischemia and age-consistent atrophy. - Treat UTI as above.  - Delirium precautions - Continue aricept (no bradycardia noted), namenda, prn seroquel (pt refusing medications as inpatient)  HTN:  - Continue lisinopril  HLD:  - Continue statin  Spinal stenosis:  - Follow up with orthopedics, Dr. Nelva Bush, for ongoing management, has had epidural injections in the past. Unable to perform this as inpatient.  - Continue pain control  Anemia of chronic disease: Suspected etiology of normocytic/normochromic anemia in absence of acute bleeding. Stabilized.  Discharge Instructions Discharge Instructions    Discharge instructions   Complete by: As directed    You were admitted for acute on chronic encephalopathy, a component of which may be related to a urinary tract infection which has been treated with effective antibiotics. The benefits of hospitalization have been maximized and discharge home with a hospital bed and palliative care following have been arranged.  - Continue taking flomax daily and follow up with [facility] in 1 week to trial voiding.  - Follow up with Dr. Nelva Bush for consideration of epidural injection.     Allergies as of 12/31/2019      Reactions   Baclofen Other (See Comments)   Caused brain dysfunction  Keflex [cephalexin] Rash   Macrodantin [nitrofurantoin] Rash   Neggram [nalidixic Acid]  Rash   Nitrofurantoin Monohyd Macro Rash   Penicillins Rash   Has patient had a PCN reaction causing immediate rash, facial/tongue/throat swelling, SOB or lightheadedness with hypotension: Yes Has patient had a PCN reaction causing severe rash involving mucus membranes or skin necrosis: No Has patient had a PCN reaction that required hospitalization: No Has patient had a PCN reaction occurring within the last 10 years: No If all of the above answers are "NO", then may proceed with Cephalosporin use.   Sulfa Antibiotics Rash      Medication List    STOP taking these medications   albuterol 108 (90 Base) MCG/ACT inhaler Commonly known as: VENTOLIN HFA   ascorbic acid 500 MG tablet Commonly known as: VITAMIN C   feeding supplement (ENSURE ENLIVE) Liqd   guaiFENesin-dextromethorphan 100-10 MG/5ML syrup Commonly known as: ROBITUSSIN DM   zinc sulfate 220 (50 Zn) MG capsule     TAKE these medications   acetaminophen 500 MG tablet Commonly known as: TYLENOL Take 1,000 mg by mouth every 6 (six) hours as needed for mild pain. What changed: Another medication with the same name was removed. Continue taking this medication, and follow the directions you see here.   donepezil 10 MG tablet Commonly known as: ARICEPT Take 10 mg by mouth at bedtime.   HYDROcodone-acetaminophen 5-325 MG tablet Commonly known as: NORCO/VICODIN Take 1 tablet by mouth 3 (three) times daily as needed for moderate pain.   lisinopril 2.5 MG tablet Commonly known as: ZESTRIL Take 1 tablet (2.5 mg total) by mouth daily.   memantine 10 MG tablet Commonly known as: NAMENDA Take 1 tablet (10 mg total) by mouth 2 (two) times daily.   pravastatin 20 MG tablet Commonly known as: PRAVACHOL Take 1 tablet (20 mg total) by mouth daily at 6 PM.   tamsulosin 0.4 MG Caps capsule Commonly known as: FLOMAX Take 1 capsule (0.4 mg total) by mouth daily after breakfast.            Durable Medical Equipment   (From admission, onward)         Start     Ordered   12/31/19 1032  For home use only DME Hospital bed  Once    Question Answer Comment  Length of Need 6 Months   Bed type Semi-electric   Support Surface: Gel Overlay      12/31/19 1031         Follow-up Information    Kelton Pillar, MD Follow up.   Specialty: Family Medicine Contact information: [address] [place] [address] [phone]        [facility]. Schedule an appointment as soon as possible for a visit in 1 week(s).   Why: for voiding trial Contact information: [place]       Suella Broad, MD. Schedule an appointment as soon as possible for a visit.   Specialty: Physical Medicine and Rehabilitation Contact information: [address] [place], [place] [address] [id number]          Allergies  Allergen Reactions  . Baclofen Other (See Comments)    Caused brain dysfunction  . Keflex [Cephalexin] Rash  . Macrodantin [Nitrofurantoin] Rash  . Neggram [Nalidixic Acid] Rash  . Nitrofurantoin Monohyd Macro Rash  . Penicillins Rash    Has patient had a PCN reaction causing immediate rash,  facial/tongue/throat swelling, SOB or lightheadedness with hypotension: Yes Has patient had a PCN reaction causing severe rash involving mucus membranes or skin necrosis: No Has patient had a PCN reaction that required hospitalization: No Has patient had a PCN reaction occurring within the last 10 years: No If all of the above answers are "NO", then may proceed with Cephalosporin use.  . Sulfa Antibiotics Rash    Consultations:  Palliative care medicine team  Pharmacy  Urology  Procedures/Studies: CT Head Wo Contrast  Result Date: 12/27/2019 CLINICAL DATA:  Increased confusion and lethargy EXAM: CT HEAD WITHOUT CONTRAST TECHNIQUE: Contiguous axial images were obtained from the base of the skull  through the vertex without intravenous contrast. COMPARISON:  November 07, 2019 FINDINGS: Brain: No evidence of acute territorial infarction, hemorrhage, hydrocephalus,extra-axial collection or mass lesion/mass effect. There is dilatation the ventricles and sulci consistent with age-related atrophy. Low-attenuation changes in the deep white matter consistent with small vessel ischemia. Vascular: No hyperdense vessel or unexpected calcification. Skull: The skull is intact. No fracture or focal lesion identified. Sinuses/Orbits: The visualized paranasal sinuses and mastoid air cells are clear. The orbits and globes intact. Other: None IMPRESSION: No acute intracranial abnormality. Somewhat limited due to patient motion Findings consistent with advanced age related atrophy and chronic small vessel ischemia Electronically Signed   By: Prudencio Pair M.D.   On: 12/27/2019 04:34   DG Chest Portable 1 View  Result Date: 12/27/2019 CLINICAL DATA:  Altered mental status EXAM: PORTABLE CHEST 1 VIEW COMPARISON:  November 07, 2019 FINDINGS: The heart size and mediastinal contours are within normal limits. Aortic knob calcifications. Both lungs are clear. The visualized skeletal structures are unremarkable. IMPRESSION: No active disease. Electronically Signed   By: Prudencio Pair M.D.   On: 12/27/2019 04:20     Subjective: Confused, denies any complaints or current pain.  Discharge Exam: Vitals:   12/30/19 2019 12/31/19 0552  BP: 97/78 (!) 158/70  Pulse: 85 86  Resp: 14 16  Temp: 97.9 F (36.6 C) 98.2 F (36.8 C)  SpO2: 100% 99%   General: Pt is alert, awake, not in acute distress Cardiovascular: RRR, S1/S2 +, no rubs, no gallops Respiratory: CTA bilaterally, no wheezing, no rhonchi Abdominal: Soft, NT, ND, bowel sounds + Extremities: No edema, no cyanosis. Moving all extremities.  Neuro: Alert, not oriented. Can't state her name or husband's first name.  Labs: Basic Metabolic Panel: Recent Labs  Lab  12/27/19 0412 12/28/19 0608 12/29/19 0602  NA 136 136 141  K 3.8 4.0 3.5  CL 105 109 109  CO2 21* 19* 23  GLUCOSE 116* 78 82  BUN 14 11 8   CREATININE 0.56 0.53 0.49  CALCIUM 8.6* 8.2* 8.4*   Liver Function Tests: Recent Labs  Lab 12/27/19 0412  AST 23  ALT 15  ALKPHOS 58  BILITOT 0.8  PROT 7.0  ALBUMIN 2.9*   CBC: Recent Labs  Lab 12/27/19 0412 12/28/19 0608 12/29/19 0602  WBC 12.7* 9.5 8.1  NEUTROABS 8.6*  --   --   HGB 11.4* 9.5* 9.9*  HCT 35.6* 29.7* 31.6*  MCV 89.9 91.7 90.5  PLT 344 301 300   Urinalysis    Component Value Date/Time   COLORURINE AMBER (A) 12/27/2019 0412   APPEARANCEUR CLOUDY (A) 12/27/2019 0412   LABSPEC 1.024 12/27/2019 0412   PHURINE 6.0 12/27/2019 0412   GLUCOSEU NEGATIVE 12/27/2019 0412   HGBUR NEGATIVE 12/27/2019 [place] NEGATIVE 12/27/2019 0412   KETONESUR NEGATIVE 12/27/2019 [id number]  PROTEINUR 100 (A) 12/27/2019 0412   UROBILINOGEN 0.2 08/25/2007 0835   NITRITE POSITIVE (A) 12/27/2019 0412   LEUKOCYTESUR LARGE (A) 12/27/2019 0412    Microbiology Recent Results (from the past 240 hour(s))  Urine culture     Status: Abnormal   Collection Time: 12/27/19  4:12 AM   Specimen: Urine, Clean Catch  Result Value Ref Range Status   Specimen Description   Final    URINE, CLEAN CATCH Performed at [facility], [place] [address], [place], [place] [address]    Special Requests   Final    NONE Performed at [facility], [place] [address], [place], [place] [address]    Culture >=100,000 COLONIES/mL ENTEROCOCCUS FAECALIS (A)  Final   Report Status 12/29/2019 FINAL  Final   Organism ID, Bacteria ENTEROCOCCUS FAECALIS (A)  Final      Susceptibility   Enterococcus faecalis - MIC*    AMPICILLIN <=2 SENSITIVE Sensitive     NITROFURANTOIN <=16 SENSITIVE Sensitive     VANCOMYCIN 1 SENSITIVE Sensitive     * >=100,000 COLONIES/mL ENTEROCOCCUS FAECALIS  Blood culture (routine x 2)     Status: None  (Preliminary result)   Collection Time: 12/27/19  4:12 AM   Specimen: BLOOD  Result Value Ref Range Status   Specimen Description   Final    BLOOD BLOOD RIGHT HAND Performed at [place] [address] [place], [place] [address]    Special Requests   Final    BOTTLES DRAWN AEROBIC AND ANAEROBIC Blood Culture adequate volume Performed at [place] [address] [place], [place] [address]    Culture   Final    NO GROWTH 4 DAYS Performed at [facility], [place] [address], [place], [place] [address]    Report Status PENDING  Incomplete  SARS CORONAVIRUS 2 (TAT 6-24 HRS) Nasopharyngeal Nasopharyngeal Swab     Status: None   Collection Time: 12/27/19  6:08 AM   Specimen: Nasopharyngeal Swab  Result Value Ref Range Status   SARS Coronavirus 2 NEGATIVE NEGATIVE Final    Comment: (NOTE) SARS-CoV-2 target nucleic acids are NOT DETECTED. The SARS-CoV-2 RNA is generally detectable in upper and lower respiratory specimens during the acute phase of infection. Negative results do not preclude SARS-CoV-2 infection, do not rule out co-infections with other pathogens, and should not be used as the sole basis for treatment or other patient management decisions. Negative results must be combined with clinical observations, patient history, and epidemiological information. The expected result is Negative. Fact Sheet for Patients: SugarRoll.be Fact Sheet for Healthcare Providers: https://www.woods-mathews.com/ This test is not yet approved or cleared by the [place] FDA and  has been authorized for detection and/or diagnosis of SARS-CoV-2 by FDA under an Emergency Use Authorization (EUA). This EUA will remain  in effect (meaning this test can be used) for the duration of the COVID-19 declaration under Section 56 4(b)(1) of the Act, 21 U.S.C. section 360bbb-3(b)(1), unless the authorization is  terminated or revoked sooner. Performed at [facility], [place] [address], [place], [place] [address]   Blood culture (routine x 2)     Status: None (Preliminary result)   Collection Time: 12/27/19 11:57 AM   Specimen: BLOOD  Result Value Ref Range Status   Specimen Description   Final    BLOOD BLOOD LEFT FOREARM Performed at [place] [address], [place],  [address]    Special Requests  Final    BOTTLES DRAWN AEROBIC ONLY Blood Culture results may not be optimal due to an inadequate volume of blood received in culture bottles Performed at [facility], [place] [address], [place], [place] [address]    Culture   Final    NO GROWTH 4 DAYS Performed at [facility], [place] [address], [place], [place] [address]    Report Status PENDING  Incomplete    Time coordinating discharge: Approximately 40 minutes  Patrecia Pour, MD  [facility] 12/31/2019, 10:38 AM

## 2019-12-31 NOTE — Progress Notes (Signed)
Called and gave report to patient husband Dr. Newton Pigg. He was very verbally that he wouldn't be following up with [facility], Dr. Nelva Bush, and he won't be picking up the tamsulosin because he believes that its in the patient best interest to keep the catheter in place.

## 2019-12-31 NOTE — Clinical Social Work Note (Signed)
    Durable Medical Equipment  (From admission, onward)         Start     Ordered   12/30/19 1748  For home use only DME Hospital bed  Once    Question Answer Comment  Length of Need 6 Months   Bed type Semi-electric      12/30/19 1752

## 2019-12-31 NOTE — TOC Initial Note (Addendum)
Transition of Care [facility]) - Initial/Assessment Note    Patient Details  Name: Tammy Arnold MRN: [id number] Date of Birth: 02-27-1941  [facility] CM/SW Contact:    Trish Mage, LCSW Phone Number: 12/31/2019, 8:10 AM  Clinical Narrative:   CSW responding to d/c order, order for hospital bed by Dr.  Lane Hacker with [facility] who will enter order immediately.  He states that last week they were behind by up to 24 hours on delivery and will get back with me to let me know if this order can be same day delivery. TOC will continue to follow during the course of hospitalization.  Addendum:  More orders needed related to hospital bed per [facility] at [facility].  Dr Kennon Holter.  Addendum II:  Received confirmation of hosp, bed delivery.  Called husband to confirm.  He stated they would like resumption of HH PT and OT services, and do not need a wheelchair as they already have one.  Messaged Dr with request for orders.  [facility]AR set up for ride home. TOC sign off.                      Expected Discharge Plan: Home/Self Care(Palliative referral) Barriers to Discharge: Other (comment)(Possible delay in delivery of hospital bed)   Patient Goals and CMS Choice        Expected Discharge Plan and Services Expected Discharge Plan: Home/Self Care(Palliative referral)         Expected Discharge Date: 12/31/19                                    Prior Living Arrangements/Services                       Activities of Daily Living   ADL Screening (condition at time of admission) [name] cognitive ability adequate to safely complete daily activities?: No Is the patient deaf or have difficulty hearing?: No Does the patient have difficulty seeing, even when wearing glasses/contacts?: No Does the patient have difficulty concentrating, remembering, or making decisions?: No Patient able to express need for assistance with ADLs?: Yes Does the patient have difficulty dressing or  bathing?: Yes Independently performs ADLs?: No Communication: Independent Dressing (OT): Needs assistance Is this a change from baseline?: Pre-admission baseline Grooming: Needs assistance Is this a change from baseline?: Pre-admission baseline Feeding: Needs assistance Is this a change from baseline?: Pre-admission baseline Bathing: Needs assistance Is this a change from baseline?: Pre-admission baseline Toileting: Needs assistance Is this a change from baseline?: Pre-admission baseline In/Out Bed: Needs assistance Is this a change from baseline?: Pre-admission baseline Walks in Home: Needs assistance Is this a change from baseline?: Pre-admission baseline Does the patient have difficulty walking or climbing stairs?: Yes Weakness of Legs: None Weakness of Arms/Hands: None  Permission Sought/Granted                  Emotional Assessment              Admission diagnosis:  Acute urinary tract infection [N39.0] Altered mental status, unspecified altered mental status type [id number] Acute metabolic encephalopathy [id number] Metabolic encephalopathy [id number] Patient Active Problem List   Diagnosis Date Noted  . Acute metabolic encephalopathy 0000000  . Metabolic encephalopathy 0000000  . Acute lower UTI 12/27/2019  . Foley catheter in place on admission 12/27/2019  . COVID-19 virus infection 11/07/2019  .  Leukocytopenia 11/07/2019  . Back pain 12/27/2017  . Small vessel disease, cerebrovascular   . 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 Ataxia 12/05/2017  . Cognitive impairment   . Abnormal MRI   . Blurry vision   . Hypokalemia   . Acute blood loss anemia   . TIA (transient ischemic attack) 12/04/2017  . Essential hypertension 12/04/2017  . HLD (hyperlipidemia) 12/04/2017   PCP:  Kelton Pillar, MD Pharmacy:   [id number] - [place], [place] [id number] [place] [phone] Phone:  [phone] Fax: [phone]     Social Determinants of Health (SDOH) Interventions    Readmission Risk Interventions No flowsheet data found.

## 2020-01-01 DIAGNOSIS — U071 COVID-19: Secondary | ICD-10-CM | POA: Diagnosis not present

## 2020-01-01 DIAGNOSIS — F0390 Unspecified dementia without behavioral disturbance: Secondary | ICD-10-CM | POA: Diagnosis not present

## 2020-01-01 DIAGNOSIS — G9340 Encephalopathy, unspecified: Secondary | ICD-10-CM | POA: Diagnosis not present

## 2020-01-01 DIAGNOSIS — M1990 Unspecified osteoarthritis, unspecified site: Secondary | ICD-10-CM | POA: Diagnosis not present

## 2020-01-01 DIAGNOSIS — Z466 Encounter for fitting and adjustment of urinary device: Secondary | ICD-10-CM | POA: Diagnosis not present

## 2020-01-01 DIAGNOSIS — I10 Essential (primary) hypertension: Secondary | ICD-10-CM | POA: Diagnosis not present

## 2020-01-01 LAB — CULTURE, BLOOD (ROUTINE X 2)
Culture: NO GROWTH
Culture: NO GROWTH
Special Requests: ADEQUATE

## 2020-01-03 ENCOUNTER — Telehealth: Payer: Self-pay | Admitting: Neurology

## 2020-01-03 NOTE — Telephone Encounter (Signed)
Left vm for patients husband to call back .

## 2020-01-03 NOTE — Telephone Encounter (Signed)
Please call husband regarding his concerns: unfortunately, there is no specific medication I can call in at this time. She may have residual mobility difficulties since she was recently hospitalized for COVID-19.  She was also recently treated for a UTI which may be lingering at this time.  I would highly recommend that he make a follow-up appointment with her PCP to see if there is any evidence of dehydration, ongoing infection, electrolyte disturbance etc. Please explain to husband.

## 2020-01-03 NOTE — Telephone Encounter (Signed)
Carbo,Thomas F(husband) has called to report changes in pt since pt came home from [place] for recovery of Covid-19.  Husband states before pt had Covid-19 she was able to walk down the street and back home which is about 100 feet.  Husband states pt was in the hospital with Covid-19 for 5 days and was then sent to [facility] for 10.  Husband states since pt has been home she is not able to walk and husband states she fears motion, exa: if he tries to turn her for any reason for cleaning her or to help her.  Husband is asking if there is a medication that can be called in for pt.

## 2020-01-04 DIAGNOSIS — Z466 Encounter for fitting and adjustment of urinary device: Secondary | ICD-10-CM | POA: Diagnosis not present

## 2020-01-04 DIAGNOSIS — F0390 Unspecified dementia without behavioral disturbance: Secondary | ICD-10-CM | POA: Diagnosis not present

## 2020-01-04 DIAGNOSIS — I10 Essential (primary) hypertension: Secondary | ICD-10-CM | POA: Diagnosis not present

## 2020-01-04 DIAGNOSIS — M1990 Unspecified osteoarthritis, unspecified site: Secondary | ICD-10-CM | POA: Diagnosis not present

## 2020-01-04 DIAGNOSIS — U071 COVID-19: Secondary | ICD-10-CM | POA: Diagnosis not present

## 2020-01-04 DIAGNOSIS — G9340 Encephalopathy, unspecified: Secondary | ICD-10-CM | POA: Diagnosis not present

## 2020-01-07 ENCOUNTER — Telehealth: Payer: Self-pay | Admitting: Internal Medicine

## 2020-01-07 NOTE — Telephone Encounter (Signed)
Scheduled [facility] Palliative visit for 01-11-20 at 3:00.

## 2020-01-08 DIAGNOSIS — U071 COVID-19: Secondary | ICD-10-CM | POA: Diagnosis not present

## 2020-01-08 DIAGNOSIS — G9340 Encephalopathy, unspecified: Secondary | ICD-10-CM | POA: Diagnosis not present

## 2020-01-08 DIAGNOSIS — F0390 Unspecified dementia without behavioral disturbance: Secondary | ICD-10-CM | POA: Diagnosis not present

## 2020-01-08 DIAGNOSIS — Z466 Encounter for fitting and adjustment of urinary device: Secondary | ICD-10-CM | POA: Diagnosis not present

## 2020-01-08 DIAGNOSIS — M1990 Unspecified osteoarthritis, unspecified site: Secondary | ICD-10-CM | POA: Diagnosis not present

## 2020-01-08 DIAGNOSIS — I10 Essential (primary) hypertension: Secondary | ICD-10-CM | POA: Diagnosis not present

## 2020-01-09 NOTE — Telephone Encounter (Signed)
I called pts husband back and he was with a pt. He will call me back before 500pm.

## 2020-01-09 NOTE — Telephone Encounter (Signed)
Pt husband called back in regards to missed call  cb#[phone]

## 2020-01-09 NOTE — Telephone Encounter (Signed)
Pt husband called back in regards to missed call states they will call again in the morning

## 2020-01-09 NOTE — Telephone Encounter (Signed)
Left 2nd vm for pts husband to call back his concerns.

## 2020-01-10 NOTE — Telephone Encounter (Signed)
Elliff,Thomas F(husband) has called Katrina,RN back.  Please call

## 2020-01-10 NOTE — Telephone Encounter (Signed)
I called pts husband about Dr. Rexene Alberts recommendations listed below. He stated pt had labs in the hospital but has not seen PCP for blood work. He stated when they are helping pt she gets scared like they are going to drop her on the floor. Pt has a foley catheter for bladder retention, and [facility] comes to the house to monitor it. PT started having hallucinations and he call PCP and they order seroquel. He stated people does not walk a lot and is incontinent of bowel and bladder. Pt is sleeping more during the day and he thinks the seroquel is causing it. I advise him to call Dr. Laurann [place] for seroquel adjustment. The husband verbalized understanding.

## 2020-01-11 ENCOUNTER — Other Ambulatory Visit: Payer: Self-pay

## 2020-01-11 ENCOUNTER — Other Ambulatory Visit: Payer: Medicare Other | Admitting: Internal Medicine

## 2020-01-11 DIAGNOSIS — Z515 Encounter for palliative care: Secondary | ICD-10-CM | POA: Diagnosis not present

## 2020-01-11 DIAGNOSIS — I10 Essential (primary) hypertension: Secondary | ICD-10-CM | POA: Diagnosis not present

## 2020-01-11 DIAGNOSIS — Z466 Encounter for fitting and adjustment of urinary device: Secondary | ICD-10-CM | POA: Diagnosis not present

## 2020-01-11 DIAGNOSIS — F0390 Unspecified dementia without behavioral disturbance: Secondary | ICD-10-CM | POA: Diagnosis not present

## 2020-01-11 DIAGNOSIS — M1990 Unspecified osteoarthritis, unspecified site: Secondary | ICD-10-CM | POA: Diagnosis not present

## 2020-01-11 DIAGNOSIS — U071 COVID-19: Secondary | ICD-10-CM | POA: Diagnosis not present

## 2020-01-11 DIAGNOSIS — Z7189 Other specified counseling: Secondary | ICD-10-CM

## 2020-01-11 DIAGNOSIS — G9340 Encephalopathy, unspecified: Secondary | ICD-10-CM | POA: Diagnosis not present

## 2020-01-11 NOTE — Progress Notes (Signed)
March 19th, 2021 [facility] Palliative Care Consult Note Telephone: [phone]  Fax: [phone]  PATIENT NAME: Tammy Arnold DOB: 01-Mar-1941 MRN: [id number] [address] [place] [phone]  PRIMARY CARE PROVIDER:   Kelton Pillar, MD  REFERRING PROVIDER:  Kelton Pillar, MD [address] [place], [place] [address]  RESPONSIBLE PARTY:  Spouse ([phone]  ASSESSMENT / RECOMMENDATIONS:  1. Advance Care Planning: A. Directives: MOST form discussed in detail. [name] spouse/HCPOA desires Full Resuscitative efforts in the event of a Cardio Pulmonary Arrest. Limited Scope of Medical Interventions. Yes to Antibiotics and IVFs. No to tube feedings. MOST completed in home and left with spouse. I will upload into [facility] EMR. B. Goals of Care: Spouse hoping that patient will be able to ambulate.  2. Cognitive / Functional status: FAST 7d. Patient is constantly confused. I'm not sure she recognizes her husband or knows where she is. She speaks an occasional clear phrase, but mostly nonsensical.  She is unable to follow simple commands. She can maintain eye contact and smile. She was able to ambulate with a walker about 2 months ago, but now is bed bound. PT has been working with her but she is not participating. Though she seems to have good underlying strength and muscle tone, she is unable/unwilling to follow cues. She is unable to assist to sit up in bed. She has a h/o back pain d/t spinal stenosis, and spouse wonders if pain form this source may be discouraging her movement. However, she is unable to communicate if in pain. Spouse wonders if she would benefit from an epidural injection, as this has helped in the past. She is a very heavy pivot to bedside commode, so this is no longer being attempted. She is totally dependent in hygiene, dressing, and needs to be fed. Her appetite is diminished. She consumes 2 Ensure / day with bites of  food; spouse estimates she gets less than 1000 calories/day. She is incontinent of bowel and has a Foley catheter (previously placed for bladder retention). Spouse is concerned that patient may have a UTI. He was able to spin down her urine and check microscopically; identified WBC's and bacteria. He's not sure if chronic colonization setting of Foley, and if she should have antibiotic Rx or not. He has been advised by PCP to bring patient for urology consult, but transportation would be a problem d/t bedbound status. He is awaiting a call from urologist for some guidance. Patient has small scab over on left heel; spouse keeping heels off bed with pillows.  3. Family Supports/coping: Married more than 50 yrs, with 3 children. Spouse is a Solicitor. He is caring and supportive; grieves for patient's loss of function/cognition and ability to enjoy her life. He has 45 hr/week paid caregiver support which he feels adequate to fulfill needs at this point. Hospital bed in den. We discussed the anticipated trajectory of patient's dementia. That she likely will not be able to ambulate again, and that she will progress in her cognitive decline.   4. Follow up Palliative Care Visit: spouse will call me on a prn basis if further patient decline (and possible hospice referral) or if he wishes to update code status. We discussed that patient would be hospice eligible, especially as spouse sees no benefit/ability to bring patient to doctor's appointments. But spouse is not at the point where he would consider hospice services. I left him my contact information.  I spent  60 minutes providing this consultation from 2:30pm to 3:30pm. More than 50% of the time in this consultation was spent coordinating communication.   HISTORY OF PRESENT ILLNESS:  Tammy Arnold is a 79 y.o. female with a history of severe dementia (vascular), HTN, HLD, cerebrovascular disease without known stroke, urinary retention (foley placed;  continues), and spinal stenosis with debility (Dr. Nelva Bush epidural injections), and anemia of chronic disease.  -Jan 2021: hospitalized for covid-19 pneumonia, [facility] to [facility], and subsequently sent home -3/4-12/31/2019: hospitalized with altered mental status. Treated for UTI; placed on Flomax.  Palliative Care was asked to help address goals of care.   CODE STATUS: Full code  PPS: 30% HOSPICE ELIGIBILITY/DIAGNOSIS: TBD  PAST MEDICAL HISTORY:  Past Medical History:  Diagnosis Date  . Arthritis   . High cholesterol   . Hypertension   . Stroke [facility])     SOCIAL HX:  Social History   Tobacco Use  . Smoking status: Never Smoker  . Smokeless tobacco: Never Used  . Tobacco comment: smoked very little in college none since  Substance Use Topics  . Alcohol use: No    Alcohol/week: 0.0 standard drinks    ALLERGIES:  Allergies  Allergen Reactions  . Baclofen Other (See Comments)    Caused brain dysfunction  . Keflex [Cephalexin] Rash  . Macrodantin [Nitrofurantoin] Rash  . Neggram [Nalidixic Acid] Rash  . Nitrofurantoin Monohyd Macro Rash  . Penicillins Rash    Has patient had a PCN reaction causing immediate rash, facial/tongue/throat swelling, SOB or lightheadedness with hypotension: Yes Has patient had a PCN reaction causing severe rash involving mucus membranes or skin necrosis: No Has patient had a PCN reaction that required hospitalization: No Has patient had a PCN reaction occurring within the last 10 years: No If all of the above answers are "NO", then may proceed with Cephalosporin use.  . Sulfa Antibiotics Rash     PERTINENT MEDICATIONS:  Outpatient Encounter Medications as of 01/11/2020  Medication Sig  . acetaminophen (TYLENOL) 500 MG tablet Take 1,000 mg by mouth every 6 (six) hours as needed for mild pain.  Marland Kitchen donepezil (ARICEPT) 10 MG tablet Take 10 mg by mouth at bedtime.  Marland Kitchen HYDROcodone-acetaminophen (NORCO/VICODIN) 5-325 MG tablet Take 1 tablet by  mouth 3 (three) times daily as needed for moderate pain.   Marland Kitchen lisinopril (PRINIVIL,ZESTRIL) 2.5 MG tablet Take 1 tablet (2.5 mg total) by mouth daily.  . memantine (NAMENDA) 10 MG tablet Take 1 tablet (10 mg total) by mouth 2 (two) times daily.  . pravastatin (PRAVACHOL) 20 MG tablet Take 1 tablet (20 mg total) by mouth daily at 6 PM.  . QUEtiapine (SEROQUEL) 25 MG tablet   . tamsulosin (FLOMAX) 0.4 MG CAPS capsule Take 1 capsule (0.4 mg total) by mouth daily after breakfast.   No facility-administered encounter medications on file as of 01/11/2020.    PHYSICAL EXAM:   General: NAD, frail appearing, slender. Maintains eye contact and engages but unable to follow simple commands or answer simple questions. PE deferred to limit COVID exposure. She has no symptoms of pain or SOB. Extremities: no edema, no joint deformities Skin: no rashes Neurological: Weakness but otherwise non-focal  Julianne Handler, NP

## 2020-01-13 ENCOUNTER — Encounter: Payer: Self-pay | Admitting: Internal Medicine

## 2020-01-17 DIAGNOSIS — U071 COVID-19: Secondary | ICD-10-CM | POA: Diagnosis not present

## 2020-01-17 DIAGNOSIS — G9340 Encephalopathy, unspecified: Secondary | ICD-10-CM | POA: Diagnosis not present

## 2020-01-17 DIAGNOSIS — I10 Essential (primary) hypertension: Secondary | ICD-10-CM | POA: Diagnosis not present

## 2020-01-17 DIAGNOSIS — F0390 Unspecified dementia without behavioral disturbance: Secondary | ICD-10-CM | POA: Diagnosis not present

## 2020-01-17 DIAGNOSIS — M1990 Unspecified osteoarthritis, unspecified site: Secondary | ICD-10-CM | POA: Diagnosis not present

## 2020-01-17 DIAGNOSIS — Z466 Encounter for fitting and adjustment of urinary device: Secondary | ICD-10-CM | POA: Diagnosis not present

## 2020-01-18 DIAGNOSIS — I10 Essential (primary) hypertension: Secondary | ICD-10-CM | POA: Diagnosis not present

## 2020-01-18 DIAGNOSIS — Z466 Encounter for fitting and adjustment of urinary device: Secondary | ICD-10-CM | POA: Diagnosis not present

## 2020-01-18 DIAGNOSIS — U071 COVID-19: Secondary | ICD-10-CM | POA: Diagnosis not present

## 2020-01-18 DIAGNOSIS — M1990 Unspecified osteoarthritis, unspecified site: Secondary | ICD-10-CM | POA: Diagnosis not present

## 2020-01-18 DIAGNOSIS — F0390 Unspecified dementia without behavioral disturbance: Secondary | ICD-10-CM | POA: Diagnosis not present

## 2020-01-18 DIAGNOSIS — G9340 Encephalopathy, unspecified: Secondary | ICD-10-CM | POA: Diagnosis not present

## 2020-01-22 DIAGNOSIS — I10 Essential (primary) hypertension: Secondary | ICD-10-CM | POA: Diagnosis not present

## 2020-01-22 DIAGNOSIS — Z466 Encounter for fitting and adjustment of urinary device: Secondary | ICD-10-CM | POA: Diagnosis not present

## 2020-01-22 DIAGNOSIS — M1990 Unspecified osteoarthritis, unspecified site: Secondary | ICD-10-CM | POA: Diagnosis not present

## 2020-01-22 DIAGNOSIS — G9340 Encephalopathy, unspecified: Secondary | ICD-10-CM | POA: Diagnosis not present

## 2020-01-22 DIAGNOSIS — U071 COVID-19: Secondary | ICD-10-CM | POA: Diagnosis not present

## 2020-01-22 DIAGNOSIS — F0390 Unspecified dementia without behavioral disturbance: Secondary | ICD-10-CM | POA: Diagnosis not present

## 2020-01-23 ENCOUNTER — Other Ambulatory Visit: Payer: Self-pay

## 2020-01-23 ENCOUNTER — Emergency Department (HOSPITAL_COMMUNITY)
Admission: EM | Admit: 2020-01-23 | Discharge: 2020-01-24 | Disposition: A | Discharge status: Home / Self Care | Payer: Medicare Other | Attending: Emergency Medicine | Admitting: Emergency Medicine

## 2020-01-23 DIAGNOSIS — Z466 Encounter for fitting and adjustment of urinary device: Secondary | ICD-10-CM | POA: Diagnosis not present

## 2020-01-23 DIAGNOSIS — M4800 Spinal stenosis, site unspecified: Secondary | ICD-10-CM | POA: Diagnosis not present

## 2020-01-23 DIAGNOSIS — Z8616 Personal history of COVID-19: Secondary | ICD-10-CM | POA: Diagnosis not present

## 2020-01-23 DIAGNOSIS — E785 Hyperlipidemia, unspecified: Secondary | ICD-10-CM | POA: Diagnosis not present

## 2020-01-23 DIAGNOSIS — R32 Unspecified urinary incontinence: Secondary | ICD-10-CM | POA: Diagnosis not present

## 2020-01-23 DIAGNOSIS — F0390 Unspecified dementia without behavioral disturbance: Secondary | ICD-10-CM | POA: Insufficient documentation

## 2020-01-23 DIAGNOSIS — Z87440 Personal history of urinary (tract) infections: Secondary | ICD-10-CM | POA: Diagnosis not present

## 2020-01-23 DIAGNOSIS — M1990 Unspecified osteoarthritis, unspecified site: Secondary | ICD-10-CM | POA: Diagnosis not present

## 2020-01-23 DIAGNOSIS — D649 Anemia, unspecified: Secondary | ICD-10-CM | POA: Diagnosis not present

## 2020-01-23 DIAGNOSIS — R001 Bradycardia, unspecified: Secondary | ICD-10-CM | POA: Diagnosis not present

## 2020-01-23 DIAGNOSIS — R339 Retention of urine, unspecified: Secondary | ICD-10-CM | POA: Diagnosis not present

## 2020-01-23 DIAGNOSIS — Z9181 History of falling: Secondary | ICD-10-CM | POA: Diagnosis not present

## 2020-01-23 DIAGNOSIS — I10 Essential (primary) hypertension: Secondary | ICD-10-CM | POA: Insufficient documentation

## 2020-01-23 DIAGNOSIS — R159 Full incontinence of feces: Secondary | ICD-10-CM | POA: Diagnosis not present

## 2020-01-23 DIAGNOSIS — Z87891 Personal history of nicotine dependence: Secondary | ICD-10-CM | POA: Insufficient documentation

## 2020-01-23 DIAGNOSIS — M419 Scoliosis, unspecified: Secondary | ICD-10-CM | POA: Diagnosis not present

## 2020-01-23 DIAGNOSIS — D696 Thrombocytopenia, unspecified: Secondary | ICD-10-CM | POA: Diagnosis not present

## 2020-01-23 DIAGNOSIS — Z993 Dependence on wheelchair: Secondary | ICD-10-CM | POA: Diagnosis not present

## 2020-01-23 LAB — BASIC METABOLIC PANEL
Anion gap: 12 (ref 5–15)
BUN: 16 mg/dL (ref 8–23)
CO2: 25 mmol/L (ref 22–32)
Calcium: 9.5 mg/dL (ref 8.9–10.3)
Chloride: 108 mmol/L (ref 98–111)
Creatinine, Ser: 0.55 mg/dL (ref 0.44–1.00)
GFR calc Af Amer: >60 mL/min (ref >60)
GFR calc non Af Amer: >60 mL/min (ref >60)
Glucose, Bld: 106 mg/dL — ABNORMAL HIGH (ref 70–99)
Potassium: 4.4 mmol/L (ref 3.5–5.1)
Sodium: 145 mmol/L (ref 135–145)

## 2020-01-23 LAB — URINALYSIS, ROUTINE W REFLEX MICROSCOPIC
Bilirubin Urine: NEGATIVE
Glucose, UA: NEGATIVE mg/dL
Hgb urine dipstick: NEGATIVE
Ketones, ur: NEGATIVE mg/dL
Nitrite: NEGATIVE
Protein, ur: 30 mg/dL — AB
Specific Gravity, Urine: 1.02 (ref 1.005–1.030)
WBC, UA: >50 WBC/hpf — ABNORMAL HIGH (ref 0–5)
pH: 7 (ref 5.0–8.0)

## 2020-01-23 LAB — CBC WITH DIFFERENTIAL/PLATELET
Abs Immature Granulocytes: 0.11 10*3/uL — ABNORMAL HIGH (ref 0.00–0.07)
Basophils Absolute: 0.1 10*3/uL (ref 0.0–0.1)
Basophils Relative: 1 %
Eosinophils Absolute: 0.2 10*3/uL (ref 0.0–0.5)
Eosinophils Relative: 2 %
HCT: 32.3 % — ABNORMAL LOW (ref 36.0–46.0)
Hemoglobin: 10.1 g/dL — ABNORMAL LOW (ref 12.0–15.0)
Immature Granulocytes: 1 %
Lymphocytes Relative: 35 %
Lymphs Abs: 4.3 10*3/uL — ABNORMAL HIGH (ref 0.7–4.0)
MCH: 27.8 pg (ref 26.0–34.0)
MCHC: 31.3 g/dL (ref 30.0–36.0)
MCV: 89 fL (ref 80.0–100.0)
Monocytes Absolute: 0.7 10*3/uL (ref 0.1–1.0)
Monocytes Relative: 5 %
Neutro Abs: 7.1 10*3/uL (ref 1.7–7.7)
Neutrophils Relative %: 56 %
Platelets: 441 10*3/uL — ABNORMAL HIGH (ref 150–400)
RBC: 3.63 MIL/uL — ABNORMAL LOW (ref 3.87–5.11)
RDW: 13.6 % (ref 11.5–15.5)
WBC: 12.5 10*3/uL — ABNORMAL HIGH (ref 4.0–10.5)
nRBC: 0 % (ref 0.0–0.2)

## 2020-01-23 NOTE — ED Triage Notes (Signed)
Patient arrived with EMS from home c/o urinary retention. Per EMS patient's foley bag has no urine out since this morning. Per EMS a Home health nurse unable to put new foley catheter in so family called EMS. Patient hx of dementia.

## 2020-01-23 NOTE — ED Provider Notes (Signed)
Emergency Department Provider Note   I have reviewed the triage vital signs and the nursing notes.   HISTORY  Chief Complaint Urinary Retention   HPI Tammy Arnold is a 79 y.o. female with PMH of dementia and urinary retention requiring foley catheter presents to the ED with no urine output into the foley bag. The home health nurse noted this today but could not exchange the catheter and the patient was transported to the ED. She has not complaints currently. Level 5 caveat applies with Dementia.   In speaking with the patient's husband by phone the patient had very little urine output throughout the day.  The home health nurse attempted to flush the Foley and was getting sediment but not a large amount of urine.  The Foley catheter was removed and then she attempted to replace it with a new catheter but got no urine output.  The home health nurse could not attempt further and EMS was called for transfer to the ED for evaluation.  Past Medical History:  Diagnosis Date  . Arthritis   . High cholesterol   . Hypertension   . Stroke [facility])     Patient Active Problem List   Diagnosis Date Noted  . Acute metabolic encephalopathy 0000000  . Metabolic encephalopathy 0000000  . Acute lower UTI 12/27/2019  . Foley catheter in place on admission 12/27/2019  . COVID-19 virus infection 11/07/2019  . Leukocytopenia 11/07/2019  . Back pain 12/27/2017  . Small vessel disease, cerebrovascular   . 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 Ataxia 12/05/2017  . Cognitive impairment   . Abnormal MRI   . Blurry vision   . Hypokalemia   . Acute blood loss anemia   . TIA (transient ischemic attack) 12/04/2017  . Essential hypertension 12/04/2017  . HLD (hyperlipidemia) 12/04/2017    Past Surgical History:  Procedure Laterality Date  . CATARACT EXTRACTION  11/23/2017  . REPLACEMENT TOTAL KNEE      Allergies Baclofen, Keflex [cephalexin], Macrodantin [nitrofurantoin], Neggram [nalidixic  acid], Nitrofurantoin monohyd macro, Penicillins, and Sulfa antibiotics  Family History  Problem Relation Age of Onset  . Dementia Mother     Social History Social History   Tobacco Use  . Smoking status: Never Smoker  . Smokeless tobacco: Never Used  . Tobacco comment: smoked very little in college none since  Substance Use Topics  . Alcohol use: No    Alcohol/week: 0.0 standard drinks  . Drug use: Not Currently    Review of Systems  Level 5 caveat: Dementia   ____________________________________________   PHYSICAL EXAM:  VITAL SIGNS: ED Triage Vitals [01/23/20 2029]  Enc Vitals Group     BP (!) 150/90     Pulse Rate 80     Resp 20     Temp      Temp src      SpO2 100 %   Constitutional: Alert and talkative but confused (baseline). Well appearing and in no acute distress. Eyes: Conjunctivae are normal.  Head: Atraumatic. Nose: No congestion/rhinnorhea. Mouth/Throat: Mucous membranes are moist.  Neck: No stridor.   Cardiovascular: Normal rate, regular rhythm. Good peripheral circulation. Grossly normal heart sounds.   Respiratory: Normal respiratory effort.  No retractions. Lungs CTAB. Gastrointestinal: Soft and nontender. No distention.  Musculoskeletal: No gross deformities of extremities. Neurologic:  Normal speech and language.  Skin:  Skin is warm, dry and intact. No rash noted.  ____________________________________________   LABS (all labs ordered are listed, but only abnormal results are  displayed)  Labs Reviewed  URINALYSIS, ROUTINE W REFLEX MICROSCOPIC - Abnormal; Notable for the following components:      Result Value   APPearance CLOUDY (*)    Protein, ur 30 (*)    Leukocytes,Ua LARGE (*)    WBC, UA >50 (*)    Bacteria, UA FEW (*)    All other components within normal limits  BASIC METABOLIC PANEL - Abnormal; Notable for the following components:   Glucose, Bld 106 (*)    All other components within normal limits  CBC WITH  DIFFERENTIAL/PLATELET - Abnormal; Notable for the following components:   WBC 12.5 (*)    RBC 3.63 (*)    Hemoglobin 10.1 (*)    HCT 32.3 (*)    Platelets 441 (*)    Lymphs Abs 4.3 (*)    Abs Immature Granulocytes 0.11 (*)    All other components within normal limits  URINE CULTURE   ____________________________________________   PROCEDURES  Procedure(s) performed:   Procedures  None ____________________________________________   INITIAL IMPRESSION / ASSESSMENT AND PLAN / ED COURSE  Pertinent labs & imaging results that were available during my care of the patient were reviewed by me and considered in my medical decision making (see chart for details).   Patient presents to the emergency department with no output in her urinary catheter since this morning.  Will exchange the catheter and reassess.  UA and culture sent.  Patient appears well-hydrated and at her baseline mental status.  Doubt acute renal failure.   Foley catheter placed with good urine return. No complication.  Lab work shows no acute kidney injury.  UA with large leukocytes, greater than 50 WBCs and few bacteria.  Discussed this with the patient's husband by phone.  After discussion, I feel it is reasonable to follow the urine culture in this case rather than treat empirically.  She is at her mental status at baseline, afebrile, normal vitals.  This could be colonization given her indwelling Foley status.  [facility] who can assist with further Foley catheter management.  Patient has home health.  [name] husband is comfortable with her returning home. Calling [facility] for transfer.  ____________________________________________  FINAL CLINICAL IMPRESSION(S) / ED DIAGNOSES  Final diagnoses:  Urinary retention    Note:  This document was prepared using Dragon voice recognition software and may include unintentional dictation errors.  Nanda Quinton, MD, [facility]      Karyl Sharrar, [facility], MD 01/23/20 2256

## 2020-01-23 NOTE — Discharge Instructions (Addendum)
You were seen in the emergency department today with urinary retention.  We replaced the Foley catheter.  Your urine test shows leukocytes and bacteria.  I am sending this for culture and you will be called if a bacterial species grows out.  If you develop decline in mental status, fevers, or other symptoms we would consider starting antibiotics at that time.  Please follow with your urologist and primary care doctor as scheduled moving forward.

## 2020-01-24 DIAGNOSIS — Z743 Need for continuous supervision: Secondary | ICD-10-CM | POA: Diagnosis not present

## 2020-01-24 DIAGNOSIS — R5381 Other malaise: Secondary | ICD-10-CM | POA: Diagnosis not present

## 2020-01-24 DIAGNOSIS — R279 Unspecified lack of coordination: Secondary | ICD-10-CM | POA: Diagnosis not present

## 2020-01-25 DIAGNOSIS — D649 Anemia, unspecified: Secondary | ICD-10-CM | POA: Diagnosis not present

## 2020-01-25 DIAGNOSIS — R339 Retention of urine, unspecified: Secondary | ICD-10-CM | POA: Diagnosis not present

## 2020-01-25 DIAGNOSIS — F0390 Unspecified dementia without behavioral disturbance: Secondary | ICD-10-CM | POA: Diagnosis not present

## 2020-01-25 DIAGNOSIS — M1990 Unspecified osteoarthritis, unspecified site: Secondary | ICD-10-CM | POA: Diagnosis not present

## 2020-01-25 DIAGNOSIS — I10 Essential (primary) hypertension: Secondary | ICD-10-CM | POA: Diagnosis not present

## 2020-01-25 DIAGNOSIS — M4800 Spinal stenosis, site unspecified: Secondary | ICD-10-CM | POA: Diagnosis not present

## 2020-01-25 LAB — URINE CULTURE: Culture: 70000 — AB

## 2020-01-29 DIAGNOSIS — M4800 Spinal stenosis, site unspecified: Secondary | ICD-10-CM | POA: Diagnosis not present

## 2020-01-29 DIAGNOSIS — D649 Anemia, unspecified: Secondary | ICD-10-CM | POA: Diagnosis not present

## 2020-01-29 DIAGNOSIS — M1990 Unspecified osteoarthritis, unspecified site: Secondary | ICD-10-CM | POA: Diagnosis not present

## 2020-01-29 DIAGNOSIS — R339 Retention of urine, unspecified: Secondary | ICD-10-CM | POA: Diagnosis not present

## 2020-01-29 DIAGNOSIS — F0390 Unspecified dementia without behavioral disturbance: Secondary | ICD-10-CM | POA: Diagnosis not present

## 2020-01-29 DIAGNOSIS — I10 Essential (primary) hypertension: Secondary | ICD-10-CM | POA: Diagnosis not present

## 2020-02-01 DIAGNOSIS — R339 Retention of urine, unspecified: Secondary | ICD-10-CM | POA: Diagnosis not present

## 2020-02-01 DIAGNOSIS — M4800 Spinal stenosis, site unspecified: Secondary | ICD-10-CM | POA: Diagnosis not present

## 2020-02-01 DIAGNOSIS — I10 Essential (primary) hypertension: Secondary | ICD-10-CM | POA: Diagnosis not present

## 2020-02-01 DIAGNOSIS — F0390 Unspecified dementia without behavioral disturbance: Secondary | ICD-10-CM | POA: Diagnosis not present

## 2020-02-01 DIAGNOSIS — M1990 Unspecified osteoarthritis, unspecified site: Secondary | ICD-10-CM | POA: Diagnosis not present

## 2020-02-01 DIAGNOSIS — D649 Anemia, unspecified: Secondary | ICD-10-CM | POA: Diagnosis not present

## 2020-02-05 DIAGNOSIS — M1990 Unspecified osteoarthritis, unspecified site: Secondary | ICD-10-CM | POA: Diagnosis not present

## 2020-02-05 DIAGNOSIS — D649 Anemia, unspecified: Secondary | ICD-10-CM | POA: Diagnosis not present

## 2020-02-05 DIAGNOSIS — R339 Retention of urine, unspecified: Secondary | ICD-10-CM | POA: Diagnosis not present

## 2020-02-05 DIAGNOSIS — F0390 Unspecified dementia without behavioral disturbance: Secondary | ICD-10-CM | POA: Diagnosis not present

## 2020-02-05 DIAGNOSIS — I10 Essential (primary) hypertension: Secondary | ICD-10-CM | POA: Diagnosis not present

## 2020-02-05 DIAGNOSIS — M4800 Spinal stenosis, site unspecified: Secondary | ICD-10-CM | POA: Diagnosis not present

## 2020-02-14 DIAGNOSIS — D649 Anemia, unspecified: Secondary | ICD-10-CM | POA: Diagnosis not present

## 2020-02-14 DIAGNOSIS — R339 Retention of urine, unspecified: Secondary | ICD-10-CM | POA: Diagnosis not present

## 2020-02-14 DIAGNOSIS — F0390 Unspecified dementia without behavioral disturbance: Secondary | ICD-10-CM | POA: Diagnosis not present

## 2020-02-14 DIAGNOSIS — M4800 Spinal stenosis, site unspecified: Secondary | ICD-10-CM | POA: Diagnosis not present

## 2020-02-14 DIAGNOSIS — I10 Essential (primary) hypertension: Secondary | ICD-10-CM | POA: Diagnosis not present

## 2020-02-14 DIAGNOSIS — M1990 Unspecified osteoarthritis, unspecified site: Secondary | ICD-10-CM | POA: Diagnosis not present

## 2020-02-17 DIAGNOSIS — Z515 Encounter for palliative care: Secondary | ICD-10-CM

## 2020-02-19 DIAGNOSIS — M1990 Unspecified osteoarthritis, unspecified site: Secondary | ICD-10-CM | POA: Diagnosis not present

## 2020-02-19 DIAGNOSIS — I10 Essential (primary) hypertension: Secondary | ICD-10-CM | POA: Diagnosis not present

## 2020-02-19 DIAGNOSIS — R339 Retention of urine, unspecified: Secondary | ICD-10-CM | POA: Diagnosis not present

## 2020-02-19 DIAGNOSIS — F0390 Unspecified dementia without behavioral disturbance: Secondary | ICD-10-CM | POA: Diagnosis not present

## 2020-02-19 DIAGNOSIS — M4800 Spinal stenosis, site unspecified: Secondary | ICD-10-CM | POA: Diagnosis not present

## 2020-02-19 DIAGNOSIS — D649 Anemia, unspecified: Secondary | ICD-10-CM | POA: Diagnosis not present

## 2020-02-21 ENCOUNTER — Other Ambulatory Visit: Payer: Medicare Other

## 2020-02-21 DIAGNOSIS — F0390 Unspecified dementia without behavioral disturbance: Secondary | ICD-10-CM | POA: Diagnosis not present

## 2020-02-21 DIAGNOSIS — D649 Anemia, unspecified: Secondary | ICD-10-CM | POA: Diagnosis not present

## 2020-02-21 DIAGNOSIS — M1990 Unspecified osteoarthritis, unspecified site: Secondary | ICD-10-CM | POA: Diagnosis not present

## 2020-02-21 DIAGNOSIS — M4800 Spinal stenosis, site unspecified: Secondary | ICD-10-CM | POA: Diagnosis not present

## 2020-02-21 DIAGNOSIS — I10 Essential (primary) hypertension: Secondary | ICD-10-CM | POA: Diagnosis not present

## 2020-02-21 DIAGNOSIS — R339 Retention of urine, unspecified: Secondary | ICD-10-CM | POA: Diagnosis not present

## 2020-02-21 DIAGNOSIS — Z515 Encounter for palliative care: Secondary | ICD-10-CM

## 2020-02-22 ENCOUNTER — Other Ambulatory Visit: Payer: Self-pay

## 2020-02-22 DIAGNOSIS — Z9181 History of falling: Secondary | ICD-10-CM | POA: Diagnosis not present

## 2020-02-22 DIAGNOSIS — I10 Essential (primary) hypertension: Secondary | ICD-10-CM | POA: Diagnosis not present

## 2020-02-22 DIAGNOSIS — M4800 Spinal stenosis, site unspecified: Secondary | ICD-10-CM | POA: Diagnosis not present

## 2020-02-22 DIAGNOSIS — D696 Thrombocytopenia, unspecified: Secondary | ICD-10-CM | POA: Diagnosis not present

## 2020-02-22 DIAGNOSIS — Z87440 Personal history of urinary (tract) infections: Secondary | ICD-10-CM | POA: Diagnosis not present

## 2020-02-22 DIAGNOSIS — Z466 Encounter for fitting and adjustment of urinary device: Secondary | ICD-10-CM | POA: Diagnosis not present

## 2020-02-22 DIAGNOSIS — R159 Full incontinence of feces: Secondary | ICD-10-CM | POA: Diagnosis not present

## 2020-02-22 DIAGNOSIS — Z8616 Personal history of COVID-19: Secondary | ICD-10-CM | POA: Diagnosis not present

## 2020-02-22 DIAGNOSIS — R32 Unspecified urinary incontinence: Secondary | ICD-10-CM | POA: Diagnosis not present

## 2020-02-22 DIAGNOSIS — M1990 Unspecified osteoarthritis, unspecified site: Secondary | ICD-10-CM | POA: Diagnosis not present

## 2020-02-22 DIAGNOSIS — M419 Scoliosis, unspecified: Secondary | ICD-10-CM | POA: Diagnosis not present

## 2020-02-22 DIAGNOSIS — R001 Bradycardia, unspecified: Secondary | ICD-10-CM | POA: Diagnosis not present

## 2020-02-22 DIAGNOSIS — D649 Anemia, unspecified: Secondary | ICD-10-CM | POA: Diagnosis not present

## 2020-02-22 DIAGNOSIS — R339 Retention of urine, unspecified: Secondary | ICD-10-CM | POA: Diagnosis not present

## 2020-02-22 DIAGNOSIS — Z993 Dependence on wheelchair: Secondary | ICD-10-CM | POA: Diagnosis not present

## 2020-02-22 DIAGNOSIS — F0390 Unspecified dementia without behavioral disturbance: Secondary | ICD-10-CM | POA: Diagnosis not present

## 2020-02-22 DIAGNOSIS — E785 Hyperlipidemia, unspecified: Secondary | ICD-10-CM | POA: Diagnosis not present

## 2020-02-22 NOTE — Progress Notes (Signed)
COMMUNITY PALLIATIVE CARE SW NOTE  PATIENT NAME: Tammy Arnold DOB: 11/16/1940 MRN: [id number]  PRIMARY CARE PROVIDER: Kelton Pillar, MD  RESPONSIBLE PARTY:  Acct ID - Guarantor Home Phone Work Phone Relationship Acct Type  [id number] Arvin [phone]  Self P/F     [place], [place], [place] [address]   Due to the COVID-19, this visit was done via telephone from my office and it was initiated and consent by this patient and/or family.    PLAN OF CARE and INTERVENTIONS:             1. GOALS OF CARE/ ADVANCE CARE PLANNING: The goal is to keep patient at home with care. Patient is a FULL CODE. 2. SOCIAL/EMOTIONAL/SPIRITUAL ASSESSMENT/ INTERVENTIONS: SW completed Tele Health visit with patient's husband Dr. Ulanda Edison. He advised that patient's condition remains stable, while still showing decline. He reported that he was currently waiting for the [facility] nurse to come and change patient's caterer. Patient continues to receive occupational therapy through [facility]. Patient is not taking any food, but she is taking in a thousand calories a day through Boost and other juices. [name] weight is stable and no temporal wasting noted. Patient is sleeping better since Seroquel was added. Patient was having hallucinations were she yells out, but the  Seroquel has improved this as well. No pain issues. SW provided active listening, assessed patient's comfort and status.  3. PATIENT/CAREGIVER EDUCATION/ COPING: Patient is total care. Her husband report that he is coping well. The PCG is a  Retired Engineer, drilling, but does work part time. He is coping well. The supports that he has in place allow him to work and run errands. PCG is her primary caretaker in the evening, which can be overwhelming at times. No coping issues or concern noted by PCG. SW reinforced palliative SW support/counseling 4. PERSONAL EMERGENCY PLAN:  911 can be accessed for emergencies. SW reinforced access and  support from palliative care team. 5. COMMUNITY RESOURCES COORDINATION/ HEALTH CARE NAVIGATION: Patient has private sitters in her home for 42 hours a week. She is currently receiving occupational therapy through [facility].  6. FINANCIAL/LEGAL CONCERNS/INTERVENTIONS: No financial or legal issues.      SOCIAL HX:  Social History   Tobacco Use  . Smoking status: Never Smoker  . Smokeless tobacco: Never Used  . Tobacco comment: smoked very little in college none since  Substance Use Topics  . Alcohol use: No    Alcohol/week: 0.0 standard drinks    CODE STATUS:   Code Status: Prior Patient is a FULL CODE ADVANCED DIRECTIVES: N MOST FORM COMPLETE:  Yes HOSPICE EDUCATION PROVIDED: No  PPS: Patient is total care.   Duration of Tele Health visit is 25 minutes. Due to the COVID-19, this visit was done via telephone from my office and it was initiated and consent by this patient and/or family.        [address] [place], [place]

## 2020-02-27 DIAGNOSIS — F0390 Unspecified dementia without behavioral disturbance: Secondary | ICD-10-CM | POA: Diagnosis not present

## 2020-02-27 DIAGNOSIS — M4800 Spinal stenosis, site unspecified: Secondary | ICD-10-CM | POA: Diagnosis not present

## 2020-02-27 DIAGNOSIS — D649 Anemia, unspecified: Secondary | ICD-10-CM | POA: Diagnosis not present

## 2020-02-27 DIAGNOSIS — R339 Retention of urine, unspecified: Secondary | ICD-10-CM | POA: Diagnosis not present

## 2020-02-27 DIAGNOSIS — I10 Essential (primary) hypertension: Secondary | ICD-10-CM | POA: Diagnosis not present

## 2020-02-27 DIAGNOSIS — M1990 Unspecified osteoarthritis, unspecified site: Secondary | ICD-10-CM | POA: Diagnosis not present

## 2020-02-28 DIAGNOSIS — R339 Retention of urine, unspecified: Secondary | ICD-10-CM | POA: Diagnosis not present

## 2020-02-28 DIAGNOSIS — F0390 Unspecified dementia without behavioral disturbance: Secondary | ICD-10-CM | POA: Diagnosis not present

## 2020-02-28 DIAGNOSIS — D649 Anemia, unspecified: Secondary | ICD-10-CM | POA: Diagnosis not present

## 2020-02-28 DIAGNOSIS — M1990 Unspecified osteoarthritis, unspecified site: Secondary | ICD-10-CM | POA: Diagnosis not present

## 2020-02-28 DIAGNOSIS — I10 Essential (primary) hypertension: Secondary | ICD-10-CM | POA: Diagnosis not present

## 2020-02-28 DIAGNOSIS — M4800 Spinal stenosis, site unspecified: Secondary | ICD-10-CM | POA: Diagnosis not present

## 2020-03-05 DIAGNOSIS — M1990 Unspecified osteoarthritis, unspecified site: Secondary | ICD-10-CM | POA: Diagnosis not present

## 2020-03-05 DIAGNOSIS — F0390 Unspecified dementia without behavioral disturbance: Secondary | ICD-10-CM | POA: Diagnosis not present

## 2020-03-05 DIAGNOSIS — R339 Retention of urine, unspecified: Secondary | ICD-10-CM | POA: Diagnosis not present

## 2020-03-05 DIAGNOSIS — I10 Essential (primary) hypertension: Secondary | ICD-10-CM | POA: Diagnosis not present

## 2020-03-05 DIAGNOSIS — M4800 Spinal stenosis, site unspecified: Secondary | ICD-10-CM | POA: Diagnosis not present

## 2020-03-05 DIAGNOSIS — D649 Anemia, unspecified: Secondary | ICD-10-CM | POA: Diagnosis not present

## 2020-03-06 DIAGNOSIS — I10 Essential (primary) hypertension: Secondary | ICD-10-CM | POA: Diagnosis not present

## 2020-03-06 DIAGNOSIS — R339 Retention of urine, unspecified: Secondary | ICD-10-CM | POA: Diagnosis not present

## 2020-03-06 DIAGNOSIS — M4800 Spinal stenosis, site unspecified: Secondary | ICD-10-CM | POA: Diagnosis not present

## 2020-03-06 DIAGNOSIS — D649 Anemia, unspecified: Secondary | ICD-10-CM | POA: Diagnosis not present

## 2020-03-06 DIAGNOSIS — F0390 Unspecified dementia without behavioral disturbance: Secondary | ICD-10-CM | POA: Diagnosis not present

## 2020-03-06 DIAGNOSIS — M1990 Unspecified osteoarthritis, unspecified site: Secondary | ICD-10-CM | POA: Diagnosis not present

## 2020-03-11 ENCOUNTER — Other Ambulatory Visit: Payer: Self-pay

## 2020-03-11 ENCOUNTER — Other Ambulatory Visit: Payer: Medicare Other

## 2020-03-11 DIAGNOSIS — F0390 Unspecified dementia without behavioral disturbance: Secondary | ICD-10-CM | POA: Diagnosis not present

## 2020-03-11 DIAGNOSIS — M1990 Unspecified osteoarthritis, unspecified site: Secondary | ICD-10-CM | POA: Diagnosis not present

## 2020-03-11 DIAGNOSIS — M4800 Spinal stenosis, site unspecified: Secondary | ICD-10-CM | POA: Diagnosis not present

## 2020-03-11 DIAGNOSIS — R339 Retention of urine, unspecified: Secondary | ICD-10-CM | POA: Diagnosis not present

## 2020-03-11 DIAGNOSIS — D649 Anemia, unspecified: Secondary | ICD-10-CM | POA: Diagnosis not present

## 2020-03-11 DIAGNOSIS — I10 Essential (primary) hypertension: Secondary | ICD-10-CM | POA: Diagnosis not present

## 2020-03-11 DIAGNOSIS — Z515 Encounter for palliative care: Secondary | ICD-10-CM

## 2020-03-13 NOTE — Progress Notes (Signed)
COMMUNITY PALLIATIVE CARE SW NOTE  PATIENT NAME: Tammy Arnold DOB: Jan 26, 1941 MRN: [id number]  PRIMARY CARE PROVIDER: Kelton Pillar, MD  RESPONSIBLE PARTY:  Acct ID - Guarantor Home Phone Work Phone Relationship Acct Type  [id number] Arvin [phone]  Self P/F     [place], [place], [place] [address]     PLAN OF CARE and INTERVENTIONS:             1. GOALS OF CARE/ ADVANCE CARE PLANNING:  The goal is to keep patient at home. Patient is a DNR.  2. SOCIAL/EMOTIONAL/SPIRITUAL ASSESSMENT/ INTERVENTIONS:  SW completed a visit with patient at her home. Her husband-Dr. Ulanda Edison and daughter was present. Patient was in a hospital bed, laying on her side. She was awake. Patient responded to simple yes/no questions. She did not appear to be in any pain or discomfort. Her husband reported that patient has advance dementia. She is no longer eating, but taking fluids that include Boost, orange juice, coca-cola and some soup. He advised that patient's condition has a steady decline since she had COVID. Patient has a wound on her bottom and catherer that is managed by advanced [facility] nurse. Patient is restless and yell out with any touch. [name] sleep pattern is off and she is not taking any medications because she is unable to swallow. SW clarified the evidence of decline with patient's husband. Her husband inquired about the differences between the palliative care program and hospice. SW provided education about the services. SW asked him he felt patient was ready for hospice? He stated no, despite the noted decline. SW assured him that she would discuss patient's decline with the palliative care nurse for follow-up. Dr. Ulanda Edison advised that he would like to get patient vaccinated and hoped that would reverse her condition. SW advised that the agency could assist with getting patient vaccinated with homebound program-SW will add patient's name. 3. PATIENT/CAREGIVER EDUCATION/ COPING:  SW  provided education to Dr. Ulanda Edison regarding the palliative and hospice programs. Dr.Canino has some medical knowledge, which has been a coping mechanism. He has a supportive family.  4. PERSONAL EMERGENCY PLAN: 911 can be activated for emergencies. 5. COMMUNITY RESOURCES COORDINATION/ HEALTH CARE NAVIGATION:  Patient has sitters 40 hours a week. She also receives wound and  catheter care for patient.  6. FINANCIAL/LEGAL CONCERNS/INTERVENTIONS: No legal or financial issues.      SOCIAL HX:  Social History   Tobacco Use  . Smoking status: Never Smoker  . Smokeless tobacco: Never Used  . Tobacco comment: smoked very little in college none since  Substance Use Topics  . Alcohol use: No    Alcohol/week: 0.0 standard drinks    CODE STATUS:   Code Status: Prior DNR ADVANCED DIRECTIVES: N MOST FORM COMPLETE: Yes. HOSPICE EDUCATION PROVIDED: Yes  PPS: Patient is bedbound and dependent for all ADL's. Her verbalizations are minimal to 1/2 words. Patient is only taking liquids. She has advanced dementia.   Duration visit and documentation 75 minutes.       [address] [place], [place]

## 2020-03-18 DIAGNOSIS — F0390 Unspecified dementia without behavioral disturbance: Secondary | ICD-10-CM | POA: Diagnosis not present

## 2020-03-18 DIAGNOSIS — M1990 Unspecified osteoarthritis, unspecified site: Secondary | ICD-10-CM | POA: Diagnosis not present

## 2020-03-18 DIAGNOSIS — I10 Essential (primary) hypertension: Secondary | ICD-10-CM | POA: Diagnosis not present

## 2020-03-18 DIAGNOSIS — D649 Anemia, unspecified: Secondary | ICD-10-CM | POA: Diagnosis not present

## 2020-03-18 DIAGNOSIS — M4800 Spinal stenosis, site unspecified: Secondary | ICD-10-CM | POA: Diagnosis not present

## 2020-03-18 DIAGNOSIS — R339 Retention of urine, unspecified: Secondary | ICD-10-CM | POA: Diagnosis not present

## 2020-03-22 ENCOUNTER — Telehealth: Payer: Self-pay | Admitting: Physician Assistant

## 2020-03-22 NOTE — Telephone Encounter (Signed)
I connected by phone with Tammy Arnold and/or patient's caregiver on 03/22/2020 at 2:47 PM to discuss the potential vaccination through our Homebound vaccination initiative.   Prevaccination Checklist for COVID-19 Vaccines  1.  Are you feeling sick today? no  2.  Have you ever received a dose of a COVID-19 vaccine?  no      If yes, which one? None   3.  Have you ever had an allergic reaction: (This would include a severe reaction [ e.g., anaphylaxis] that required treatment with epinephrine or EpiPen or that caused you to go to the hospital.  It would also include an allergic reaction that occurred within 4 hours that caused hives, swelling, or respiratory distress, including wheezing.) A.  A previous dose of COVID-19 vaccine. no  B.  A vaccine or injectable therapy that contains multiple components, one of which is a COVID-19 vaccine component, but it is not known which component elicited the immediate reaction. no  C.  Are you allergic to polyethylene glycol? no   4.  Have you ever had an allergic reaction to another vaccine (other than COVID-19 vaccine) or an injectable medication? (This would include a severe reaction [ e.g., anaphylaxis] that required treatment with epinephrine or EpiPen or that caused you to go to the hospital.  It would also include an allergic reaction that occurred within 4 hours that caused hives, swelling, or respiratory distress, including wheezing.)  no   5.  Have you ever had a severe allergic reaction (e.g., anaphylaxis) to something other than a component of the COVID-19 vaccine, or any vaccine or injectable medication?  This would include food, pet, venom, environmental, or oral medication allergies.  no   6.  Have you received any vaccine in the last 14 days? no   7.  Have you ever had a positive test for COVID-19 or has a doctor ever told you that you had COVID-19?  yes - Jan 2021   8.  Have you received passive antibody therapy (monoclonal antibodies or  convalescent serum) as a treatment for COVID-19? no   9.  Do you have a weakened immune system caused by something such as HIV infection or cancer or do you take immunosuppressive drugs or therapies?  no   10.  Do you have a bleeding disorder or are you taking a blood thinner? no   11.  Are you pregnant or breast-feeding? no   12.  Do you have dermal fillers? no    This patient is a 79 y.o. female that meets the FDA criteria to receive homebound vaccination. Patient or parent/caregiver understands they have the option to accept or refuse homebound vaccination.  Patient passed the pre-screening checklist and would like to proceed with homebound vaccination.  Based on questionnaire above, I recommend the patient be observed for 30 minutes.  There are an estimated # 0  of other household members/caregivers who are also interested in receiving the vaccine.     I will send the patient's information to our scheduling team who will reach out to schedule the patient and potential caregiver/family members for homebound vaccination.    Tammy Arnold 03/22/2020 2:47 PM

## 2020-03-23 DIAGNOSIS — R32 Unspecified urinary incontinence: Secondary | ICD-10-CM | POA: Diagnosis not present

## 2020-03-23 DIAGNOSIS — R159 Full incontinence of feces: Secondary | ICD-10-CM | POA: Diagnosis not present

## 2020-03-23 DIAGNOSIS — Z466 Encounter for fitting and adjustment of urinary device: Secondary | ICD-10-CM | POA: Diagnosis not present

## 2020-03-23 DIAGNOSIS — R339 Retention of urine, unspecified: Secondary | ICD-10-CM | POA: Diagnosis not present

## 2020-03-23 DIAGNOSIS — Z993 Dependence on wheelchair: Secondary | ICD-10-CM | POA: Diagnosis not present

## 2020-03-23 DIAGNOSIS — Z87440 Personal history of urinary (tract) infections: Secondary | ICD-10-CM | POA: Diagnosis not present

## 2020-03-23 DIAGNOSIS — R001 Bradycardia, unspecified: Secondary | ICD-10-CM | POA: Diagnosis not present

## 2020-03-23 DIAGNOSIS — D696 Thrombocytopenia, unspecified: Secondary | ICD-10-CM | POA: Diagnosis not present

## 2020-03-23 DIAGNOSIS — M419 Scoliosis, unspecified: Secondary | ICD-10-CM | POA: Diagnosis not present

## 2020-03-23 DIAGNOSIS — Z9181 History of falling: Secondary | ICD-10-CM | POA: Diagnosis not present

## 2020-03-23 DIAGNOSIS — D649 Anemia, unspecified: Secondary | ICD-10-CM | POA: Diagnosis not present

## 2020-03-23 DIAGNOSIS — M1990 Unspecified osteoarthritis, unspecified site: Secondary | ICD-10-CM | POA: Diagnosis not present

## 2020-03-23 DIAGNOSIS — Z8616 Personal history of COVID-19: Secondary | ICD-10-CM | POA: Diagnosis not present

## 2020-03-23 DIAGNOSIS — L89626 Pressure-induced deep tissue damage of left heel: Secondary | ICD-10-CM | POA: Diagnosis not present

## 2020-03-23 DIAGNOSIS — F0390 Unspecified dementia without behavioral disturbance: Secondary | ICD-10-CM | POA: Diagnosis not present

## 2020-03-23 DIAGNOSIS — I10 Essential (primary) hypertension: Secondary | ICD-10-CM | POA: Diagnosis not present

## 2020-03-23 DIAGNOSIS — L89322 Pressure ulcer of left buttock, stage 2: Secondary | ICD-10-CM | POA: Diagnosis not present

## 2020-03-23 DIAGNOSIS — M4800 Spinal stenosis, site unspecified: Secondary | ICD-10-CM | POA: Diagnosis not present

## 2020-03-23 DIAGNOSIS — E785 Hyperlipidemia, unspecified: Secondary | ICD-10-CM | POA: Diagnosis not present

## 2020-04-01 ENCOUNTER — Ambulatory Visit: Payer: Medicare Other | Attending: Critical Care Medicine

## 2020-04-01 DIAGNOSIS — Z23 Encounter for immunization: Secondary | ICD-10-CM

## 2020-04-01 NOTE — Progress Notes (Signed)
° °  Covid-19 Vaccination Clinic  Name:  Tammy Arnold    MRN: [id number] DOB: 04/30/1941  04/01/2020  Tammy Arnold was observed post Covid-19 immunization for 15 minutes without incident. She was provided with Vaccine Information Sheet and instruction to access the V-Safe system.   Tammy Arnold was instructed to call 911 with any severe reactions post vaccine:  Difficulty breathing   Swelling of face and throat   A fast heartbeat   A bad rash all over body   Dizziness and weakness   Immunizations Administered    Name Date Dose VIS Date Route   Moderna COVID-19 Vaccine 04/01/2020  1:35 PM 0.5 mL 09/2019 Intramuscular   Manufacturer: Moderna   Lot: [id number]   [place]aden: [id number]

## 2020-04-02 ENCOUNTER — Telehealth: Payer: Self-pay | Admitting: *Deleted

## 2020-04-02 NOTE — Telephone Encounter (Signed)
I returned a call to patient's husband, Dr. Ulanda Edison. He is wanting to proceed with a hospice consult for his wife. I explained the referral process and he verbalized understanding. I contacted patient's PCP, Dr.Griffin, and was given a verbal order for a hospice consult and she agrees to remain the attending of record while under hospice care. I spoke with Luanne at her office. I sent this information/order over to [facility] hospice referral center for processing.

## 2020-04-24 DIAGNOSIS — F0150 Vascular dementia without behavioral disturbance: Secondary | ICD-10-CM | POA: Diagnosis not present

## 2020-04-24 DIAGNOSIS — Z8616 Personal history of COVID-19: Secondary | ICD-10-CM | POA: Diagnosis not present

## 2020-04-24 DIAGNOSIS — I10 Essential (primary) hypertension: Secondary | ICD-10-CM | POA: Diagnosis not present

## 2020-04-24 DIAGNOSIS — Z7401 Bed confinement status: Secondary | ICD-10-CM | POA: Diagnosis not present

## 2020-04-24 DIAGNOSIS — I69818 Other symptoms and signs involving cognitive functions following other cerebrovascular disease: Secondary | ICD-10-CM | POA: Diagnosis not present

## 2020-04-24 DIAGNOSIS — E785 Hyperlipidemia, unspecified: Secondary | ICD-10-CM | POA: Diagnosis not present

## 2020-04-24 DIAGNOSIS — L89324 Pressure ulcer of left buttock, stage 4: Secondary | ICD-10-CM | POA: Diagnosis not present

## 2020-04-25 DIAGNOSIS — Z8616 Personal history of COVID-19: Secondary | ICD-10-CM | POA: Diagnosis not present

## 2020-04-25 DIAGNOSIS — I10 Essential (primary) hypertension: Secondary | ICD-10-CM | POA: Diagnosis not present

## 2020-04-25 DIAGNOSIS — E785 Hyperlipidemia, unspecified: Secondary | ICD-10-CM | POA: Diagnosis not present

## 2020-04-25 DIAGNOSIS — F0150 Vascular dementia without behavioral disturbance: Secondary | ICD-10-CM | POA: Diagnosis not present

## 2020-04-25 DIAGNOSIS — I69818 Other symptoms and signs involving cognitive functions following other cerebrovascular disease: Secondary | ICD-10-CM | POA: Diagnosis not present

## 2020-04-25 DIAGNOSIS — L89324 Pressure ulcer of left buttock, stage 4: Secondary | ICD-10-CM | POA: Diagnosis not present

## 2020-04-28 DIAGNOSIS — F0150 Vascular dementia without behavioral disturbance: Secondary | ICD-10-CM | POA: Diagnosis not present

## 2020-04-28 DIAGNOSIS — I69818 Other symptoms and signs involving cognitive functions following other cerebrovascular disease: Secondary | ICD-10-CM | POA: Diagnosis not present

## 2020-04-28 DIAGNOSIS — L89324 Pressure ulcer of left buttock, stage 4: Secondary | ICD-10-CM | POA: Diagnosis not present

## 2020-04-28 DIAGNOSIS — Z8616 Personal history of COVID-19: Secondary | ICD-10-CM | POA: Diagnosis not present

## 2020-04-28 DIAGNOSIS — I10 Essential (primary) hypertension: Secondary | ICD-10-CM | POA: Diagnosis not present

## 2020-04-28 DIAGNOSIS — E785 Hyperlipidemia, unspecified: Secondary | ICD-10-CM | POA: Diagnosis not present

## 2020-04-29 ENCOUNTER — Ambulatory Visit: Payer: Medicare Other | Attending: Critical Care Medicine

## 2020-04-29 DIAGNOSIS — Z8616 Personal history of COVID-19: Secondary | ICD-10-CM | POA: Diagnosis not present

## 2020-04-29 DIAGNOSIS — F0150 Vascular dementia without behavioral disturbance: Secondary | ICD-10-CM | POA: Diagnosis not present

## 2020-04-29 DIAGNOSIS — L89324 Pressure ulcer of left buttock, stage 4: Secondary | ICD-10-CM | POA: Diagnosis not present

## 2020-04-29 DIAGNOSIS — E785 Hyperlipidemia, unspecified: Secondary | ICD-10-CM | POA: Diagnosis not present

## 2020-04-29 DIAGNOSIS — Z23 Encounter for immunization: Secondary | ICD-10-CM

## 2020-04-29 DIAGNOSIS — I69818 Other symptoms and signs involving cognitive functions following other cerebrovascular disease: Secondary | ICD-10-CM | POA: Diagnosis not present

## 2020-04-29 DIAGNOSIS — I10 Essential (primary) hypertension: Secondary | ICD-10-CM | POA: Diagnosis not present

## 2020-04-29 NOTE — Progress Notes (Signed)
   Covid-19 Vaccination Clinic  Name:  Tammy Arnold    MRN: [id number] DOB: June 09, 1941  04/29/2020  [name] was observed post Covid-19 immunization for 15 minutes without incident. She was provided with Vaccine Information Sheet and instruction to access the V-Safe system.   [name] was instructed to call 911 with any severe reactions post vaccine: Marland Kitchen Difficulty breathing  . Swelling of face and throat  . A fast heartbeat  . A bad rash all over body  . Dizziness and weakness   Immunizations Administered    Name Date Dose VIS Date Route   Moderna COVID-19 Vaccine 04/29/2020  1:15 PM 0.5 mL 09/2019 Intramuscular   Manufacturer: Levan Hurst   Lot: [id number]   [place]: [id number]

## 2020-04-30 DIAGNOSIS — L89324 Pressure ulcer of left buttock, stage 4: Secondary | ICD-10-CM | POA: Diagnosis not present

## 2020-04-30 DIAGNOSIS — E785 Hyperlipidemia, unspecified: Secondary | ICD-10-CM | POA: Diagnosis not present

## 2020-04-30 DIAGNOSIS — F0150 Vascular dementia without behavioral disturbance: Secondary | ICD-10-CM | POA: Diagnosis not present

## 2020-04-30 DIAGNOSIS — Z8616 Personal history of COVID-19: Secondary | ICD-10-CM | POA: Diagnosis not present

## 2020-04-30 DIAGNOSIS — I69818 Other symptoms and signs involving cognitive functions following other cerebrovascular disease: Secondary | ICD-10-CM | POA: Diagnosis not present

## 2020-04-30 DIAGNOSIS — I10 Essential (primary) hypertension: Secondary | ICD-10-CM | POA: Diagnosis not present

## 2020-05-02 DIAGNOSIS — I10 Essential (primary) hypertension: Secondary | ICD-10-CM | POA: Diagnosis not present

## 2020-05-02 DIAGNOSIS — I69818 Other symptoms and signs involving cognitive functions following other cerebrovascular disease: Secondary | ICD-10-CM | POA: Diagnosis not present

## 2020-05-02 DIAGNOSIS — F0150 Vascular dementia without behavioral disturbance: Secondary | ICD-10-CM | POA: Diagnosis not present

## 2020-05-02 DIAGNOSIS — E785 Hyperlipidemia, unspecified: Secondary | ICD-10-CM | POA: Diagnosis not present

## 2020-05-02 DIAGNOSIS — L89324 Pressure ulcer of left buttock, stage 4: Secondary | ICD-10-CM | POA: Diagnosis not present

## 2020-05-02 DIAGNOSIS — Z8616 Personal history of COVID-19: Secondary | ICD-10-CM | POA: Diagnosis not present

## 2020-05-05 DIAGNOSIS — L89324 Pressure ulcer of left buttock, stage 4: Secondary | ICD-10-CM | POA: Diagnosis not present

## 2020-05-05 DIAGNOSIS — Z8616 Personal history of COVID-19: Secondary | ICD-10-CM | POA: Diagnosis not present

## 2020-05-05 DIAGNOSIS — E785 Hyperlipidemia, unspecified: Secondary | ICD-10-CM | POA: Diagnosis not present

## 2020-05-05 DIAGNOSIS — I69818 Other symptoms and signs involving cognitive functions following other cerebrovascular disease: Secondary | ICD-10-CM | POA: Diagnosis not present

## 2020-05-05 DIAGNOSIS — F0150 Vascular dementia without behavioral disturbance: Secondary | ICD-10-CM | POA: Diagnosis not present

## 2020-05-05 DIAGNOSIS — I10 Essential (primary) hypertension: Secondary | ICD-10-CM | POA: Diagnosis not present

## 2020-05-06 DIAGNOSIS — I10 Essential (primary) hypertension: Secondary | ICD-10-CM | POA: Diagnosis not present

## 2020-05-06 DIAGNOSIS — Z8616 Personal history of COVID-19: Secondary | ICD-10-CM | POA: Diagnosis not present

## 2020-05-06 DIAGNOSIS — E785 Hyperlipidemia, unspecified: Secondary | ICD-10-CM | POA: Diagnosis not present

## 2020-05-06 DIAGNOSIS — F0150 Vascular dementia without behavioral disturbance: Secondary | ICD-10-CM | POA: Diagnosis not present

## 2020-05-06 DIAGNOSIS — I69818 Other symptoms and signs involving cognitive functions following other cerebrovascular disease: Secondary | ICD-10-CM | POA: Diagnosis not present

## 2020-05-06 DIAGNOSIS — L89324 Pressure ulcer of left buttock, stage 4: Secondary | ICD-10-CM | POA: Diagnosis not present

## 2020-05-09 DIAGNOSIS — Z8616 Personal history of COVID-19: Secondary | ICD-10-CM | POA: Diagnosis not present

## 2020-05-09 DIAGNOSIS — I69818 Other symptoms and signs involving cognitive functions following other cerebrovascular disease: Secondary | ICD-10-CM | POA: Diagnosis not present

## 2020-05-09 DIAGNOSIS — L89324 Pressure ulcer of left buttock, stage 4: Secondary | ICD-10-CM | POA: Diagnosis not present

## 2020-05-09 DIAGNOSIS — E785 Hyperlipidemia, unspecified: Secondary | ICD-10-CM | POA: Diagnosis not present

## 2020-05-09 DIAGNOSIS — F0150 Vascular dementia without behavioral disturbance: Secondary | ICD-10-CM | POA: Diagnosis not present

## 2020-05-09 DIAGNOSIS — I10 Essential (primary) hypertension: Secondary | ICD-10-CM | POA: Diagnosis not present

## 2020-05-12 DIAGNOSIS — E785 Hyperlipidemia, unspecified: Secondary | ICD-10-CM | POA: Diagnosis not present

## 2020-05-12 DIAGNOSIS — I69818 Other symptoms and signs involving cognitive functions following other cerebrovascular disease: Secondary | ICD-10-CM | POA: Diagnosis not present

## 2020-05-12 DIAGNOSIS — I10 Essential (primary) hypertension: Secondary | ICD-10-CM | POA: Diagnosis not present

## 2020-05-12 DIAGNOSIS — F0150 Vascular dementia without behavioral disturbance: Secondary | ICD-10-CM | POA: Diagnosis not present

## 2020-05-12 DIAGNOSIS — L89324 Pressure ulcer of left buttock, stage 4: Secondary | ICD-10-CM | POA: Diagnosis not present

## 2020-05-12 DIAGNOSIS — Z8616 Personal history of COVID-19: Secondary | ICD-10-CM | POA: Diagnosis not present

## 2020-05-13 DIAGNOSIS — L89324 Pressure ulcer of left buttock, stage 4: Secondary | ICD-10-CM | POA: Diagnosis not present

## 2020-05-13 DIAGNOSIS — I69818 Other symptoms and signs involving cognitive functions following other cerebrovascular disease: Secondary | ICD-10-CM | POA: Diagnosis not present

## 2020-05-13 DIAGNOSIS — E785 Hyperlipidemia, unspecified: Secondary | ICD-10-CM | POA: Diagnosis not present

## 2020-05-13 DIAGNOSIS — I10 Essential (primary) hypertension: Secondary | ICD-10-CM | POA: Diagnosis not present

## 2020-05-13 DIAGNOSIS — Z8616 Personal history of COVID-19: Secondary | ICD-10-CM | POA: Diagnosis not present

## 2020-05-13 DIAGNOSIS — F0150 Vascular dementia without behavioral disturbance: Secondary | ICD-10-CM | POA: Diagnosis not present

## 2020-05-14 DIAGNOSIS — I69818 Other symptoms and signs involving cognitive functions following other cerebrovascular disease: Secondary | ICD-10-CM | POA: Diagnosis not present

## 2020-05-14 DIAGNOSIS — F0150 Vascular dementia without behavioral disturbance: Secondary | ICD-10-CM | POA: Diagnosis not present

## 2020-05-14 DIAGNOSIS — I10 Essential (primary) hypertension: Secondary | ICD-10-CM | POA: Diagnosis not present

## 2020-05-14 DIAGNOSIS — L89324 Pressure ulcer of left buttock, stage 4: Secondary | ICD-10-CM | POA: Diagnosis not present

## 2020-05-14 DIAGNOSIS — E785 Hyperlipidemia, unspecified: Secondary | ICD-10-CM | POA: Diagnosis not present

## 2020-05-14 DIAGNOSIS — Z8616 Personal history of COVID-19: Secondary | ICD-10-CM | POA: Diagnosis not present

## 2020-05-16 DIAGNOSIS — I69818 Other symptoms and signs involving cognitive functions following other cerebrovascular disease: Secondary | ICD-10-CM | POA: Diagnosis not present

## 2020-05-16 DIAGNOSIS — F0150 Vascular dementia without behavioral disturbance: Secondary | ICD-10-CM | POA: Diagnosis not present

## 2020-05-16 DIAGNOSIS — E785 Hyperlipidemia, unspecified: Secondary | ICD-10-CM | POA: Diagnosis not present

## 2020-05-16 DIAGNOSIS — L89324 Pressure ulcer of left buttock, stage 4: Secondary | ICD-10-CM | POA: Diagnosis not present

## 2020-05-16 DIAGNOSIS — Z8616 Personal history of COVID-19: Secondary | ICD-10-CM | POA: Diagnosis not present

## 2020-05-16 DIAGNOSIS — I10 Essential (primary) hypertension: Secondary | ICD-10-CM | POA: Diagnosis not present

## 2020-05-19 DIAGNOSIS — L89324 Pressure ulcer of left buttock, stage 4: Secondary | ICD-10-CM | POA: Diagnosis not present

## 2020-05-19 DIAGNOSIS — I69818 Other symptoms and signs involving cognitive functions following other cerebrovascular disease: Secondary | ICD-10-CM | POA: Diagnosis not present

## 2020-05-19 DIAGNOSIS — Z8616 Personal history of COVID-19: Secondary | ICD-10-CM | POA: Diagnosis not present

## 2020-05-19 DIAGNOSIS — E785 Hyperlipidemia, unspecified: Secondary | ICD-10-CM | POA: Diagnosis not present

## 2020-05-19 DIAGNOSIS — I10 Essential (primary) hypertension: Secondary | ICD-10-CM | POA: Diagnosis not present

## 2020-05-19 DIAGNOSIS — F0150 Vascular dementia without behavioral disturbance: Secondary | ICD-10-CM | POA: Diagnosis not present

## 2020-05-20 DIAGNOSIS — I69818 Other symptoms and signs involving cognitive functions following other cerebrovascular disease: Secondary | ICD-10-CM | POA: Diagnosis not present

## 2020-05-20 DIAGNOSIS — I10 Essential (primary) hypertension: Secondary | ICD-10-CM | POA: Diagnosis not present

## 2020-05-20 DIAGNOSIS — Z8616 Personal history of COVID-19: Secondary | ICD-10-CM | POA: Diagnosis not present

## 2020-05-20 DIAGNOSIS — L89324 Pressure ulcer of left buttock, stage 4: Secondary | ICD-10-CM | POA: Diagnosis not present

## 2020-05-20 DIAGNOSIS — E785 Hyperlipidemia, unspecified: Secondary | ICD-10-CM | POA: Diagnosis not present

## 2020-05-20 DIAGNOSIS — F0150 Vascular dementia without behavioral disturbance: Secondary | ICD-10-CM | POA: Diagnosis not present

## 2020-05-23 DIAGNOSIS — Z8616 Personal history of COVID-19: Secondary | ICD-10-CM | POA: Diagnosis not present

## 2020-05-23 DIAGNOSIS — I10 Essential (primary) hypertension: Secondary | ICD-10-CM | POA: Diagnosis not present

## 2020-05-23 DIAGNOSIS — L89324 Pressure ulcer of left buttock, stage 4: Secondary | ICD-10-CM | POA: Diagnosis not present

## 2020-05-23 DIAGNOSIS — E785 Hyperlipidemia, unspecified: Secondary | ICD-10-CM | POA: Diagnosis not present

## 2020-05-23 DIAGNOSIS — I69818 Other symptoms and signs involving cognitive functions following other cerebrovascular disease: Secondary | ICD-10-CM | POA: Diagnosis not present

## 2020-05-23 DIAGNOSIS — F0150 Vascular dementia without behavioral disturbance: Secondary | ICD-10-CM | POA: Diagnosis not present

## 2020-05-25 DIAGNOSIS — I69818 Other symptoms and signs involving cognitive functions following other cerebrovascular disease: Secondary | ICD-10-CM | POA: Diagnosis not present

## 2020-05-25 DIAGNOSIS — L89324 Pressure ulcer of left buttock, stage 4: Secondary | ICD-10-CM | POA: Diagnosis not present

## 2020-05-25 DIAGNOSIS — F0150 Vascular dementia without behavioral disturbance: Secondary | ICD-10-CM | POA: Diagnosis not present

## 2020-05-25 DIAGNOSIS — I10 Essential (primary) hypertension: Secondary | ICD-10-CM | POA: Diagnosis not present

## 2020-05-25 DIAGNOSIS — Z7401 Bed confinement status: Secondary | ICD-10-CM | POA: Diagnosis not present

## 2020-05-25 DIAGNOSIS — Z8616 Personal history of COVID-19: Secondary | ICD-10-CM | POA: Diagnosis not present

## 2020-05-25 DIAGNOSIS — E785 Hyperlipidemia, unspecified: Secondary | ICD-10-CM | POA: Diagnosis not present

## 2020-05-26 DIAGNOSIS — I10 Essential (primary) hypertension: Secondary | ICD-10-CM | POA: Diagnosis not present

## 2020-05-26 DIAGNOSIS — F0150 Vascular dementia without behavioral disturbance: Secondary | ICD-10-CM | POA: Diagnosis not present

## 2020-05-26 DIAGNOSIS — Z8616 Personal history of COVID-19: Secondary | ICD-10-CM | POA: Diagnosis not present

## 2020-05-26 DIAGNOSIS — L89324 Pressure ulcer of left buttock, stage 4: Secondary | ICD-10-CM | POA: Diagnosis not present

## 2020-05-26 DIAGNOSIS — I69818 Other symptoms and signs involving cognitive functions following other cerebrovascular disease: Secondary | ICD-10-CM | POA: Diagnosis not present

## 2020-05-26 DIAGNOSIS — E785 Hyperlipidemia, unspecified: Secondary | ICD-10-CM | POA: Diagnosis not present

## 2020-05-27 ENCOUNTER — Telehealth: Payer: Self-pay | Admitting: Neurology

## 2020-05-27 NOTE — Telephone Encounter (Signed)
Called husband and advised Dr Leonie Man is out until Monday. Work in MD would like Dr Leonie Man to address when he returns. Husband verbalized understanding, appreciation.

## 2020-05-27 NOTE — Telephone Encounter (Signed)
Pt's husband called stating that he is needing a letter stating that the pt is no longer able to take care of the finances. Please advise.

## 2020-05-29 DIAGNOSIS — Z8616 Personal history of COVID-19: Secondary | ICD-10-CM | POA: Diagnosis not present

## 2020-05-29 DIAGNOSIS — I69818 Other symptoms and signs involving cognitive functions following other cerebrovascular disease: Secondary | ICD-10-CM | POA: Diagnosis not present

## 2020-05-29 DIAGNOSIS — I10 Essential (primary) hypertension: Secondary | ICD-10-CM | POA: Diagnosis not present

## 2020-05-29 DIAGNOSIS — L89324 Pressure ulcer of left buttock, stage 4: Secondary | ICD-10-CM | POA: Diagnosis not present

## 2020-05-29 DIAGNOSIS — F0150 Vascular dementia without behavioral disturbance: Secondary | ICD-10-CM | POA: Diagnosis not present

## 2020-05-29 DIAGNOSIS — E785 Hyperlipidemia, unspecified: Secondary | ICD-10-CM | POA: Diagnosis not present

## 2020-05-30 DIAGNOSIS — I69818 Other symptoms and signs involving cognitive functions following other cerebrovascular disease: Secondary | ICD-10-CM | POA: Diagnosis not present

## 2020-05-30 DIAGNOSIS — E785 Hyperlipidemia, unspecified: Secondary | ICD-10-CM | POA: Diagnosis not present

## 2020-05-30 DIAGNOSIS — Z8616 Personal history of COVID-19: Secondary | ICD-10-CM | POA: Diagnosis not present

## 2020-05-30 DIAGNOSIS — F0150 Vascular dementia without behavioral disturbance: Secondary | ICD-10-CM | POA: Diagnosis not present

## 2020-05-30 DIAGNOSIS — L89324 Pressure ulcer of left buttock, stage 4: Secondary | ICD-10-CM | POA: Diagnosis not present

## 2020-05-30 DIAGNOSIS — I10 Essential (primary) hypertension: Secondary | ICD-10-CM | POA: Diagnosis not present

## 2020-06-02 ENCOUNTER — Encounter: Payer: Self-pay | Admitting: *Deleted

## 2020-06-02 DIAGNOSIS — I69818 Other symptoms and signs involving cognitive functions following other cerebrovascular disease: Secondary | ICD-10-CM | POA: Diagnosis not present

## 2020-06-02 DIAGNOSIS — F0150 Vascular dementia without behavioral disturbance: Secondary | ICD-10-CM | POA: Diagnosis not present

## 2020-06-02 DIAGNOSIS — L89324 Pressure ulcer of left buttock, stage 4: Secondary | ICD-10-CM | POA: Diagnosis not present

## 2020-06-02 DIAGNOSIS — I10 Essential (primary) hypertension: Secondary | ICD-10-CM | POA: Diagnosis not present

## 2020-06-02 DIAGNOSIS — E785 Hyperlipidemia, unspecified: Secondary | ICD-10-CM | POA: Diagnosis not present

## 2020-06-02 DIAGNOSIS — Z8616 Personal history of COVID-19: Secondary | ICD-10-CM | POA: Diagnosis not present

## 2020-06-02 NOTE — Telephone Encounter (Signed)
I agree that patient has significant cognitive impairment which will limit patient's ability to handle her own finances hence it is okay to do a letter stating so

## 2020-06-02 NOTE — Telephone Encounter (Signed)
Letter on MD's desk for review, signature.

## 2020-06-02 NOTE — Telephone Encounter (Signed)
Letter signed, called husband but he wasn't home.  Will call tomorrow.

## 2020-06-03 DIAGNOSIS — Z8616 Personal history of COVID-19: Secondary | ICD-10-CM | POA: Diagnosis not present

## 2020-06-03 DIAGNOSIS — I69818 Other symptoms and signs involving cognitive functions following other cerebrovascular disease: Secondary | ICD-10-CM | POA: Diagnosis not present

## 2020-06-03 DIAGNOSIS — F0150 Vascular dementia without behavioral disturbance: Secondary | ICD-10-CM | POA: Diagnosis not present

## 2020-06-03 DIAGNOSIS — I10 Essential (primary) hypertension: Secondary | ICD-10-CM | POA: Diagnosis not present

## 2020-06-03 DIAGNOSIS — E785 Hyperlipidemia, unspecified: Secondary | ICD-10-CM | POA: Diagnosis not present

## 2020-06-03 DIAGNOSIS — L89324 Pressure ulcer of left buttock, stage 4: Secondary | ICD-10-CM | POA: Diagnosis not present

## 2020-06-03 NOTE — Telephone Encounter (Signed)
Attempted to reach husband, someone picked up but never spoke. Sent my chart re: letter is ready for pick up at front desk.

## 2020-06-06 DIAGNOSIS — I10 Essential (primary) hypertension: Secondary | ICD-10-CM | POA: Diagnosis not present

## 2020-06-06 DIAGNOSIS — I69818 Other symptoms and signs involving cognitive functions following other cerebrovascular disease: Secondary | ICD-10-CM | POA: Diagnosis not present

## 2020-06-06 DIAGNOSIS — E785 Hyperlipidemia, unspecified: Secondary | ICD-10-CM | POA: Diagnosis not present

## 2020-06-06 DIAGNOSIS — Z8616 Personal history of COVID-19: Secondary | ICD-10-CM | POA: Diagnosis not present

## 2020-06-06 DIAGNOSIS — L89324 Pressure ulcer of left buttock, stage 4: Secondary | ICD-10-CM | POA: Diagnosis not present

## 2020-06-06 DIAGNOSIS — F0150 Vascular dementia without behavioral disturbance: Secondary | ICD-10-CM | POA: Diagnosis not present

## 2020-06-09 DIAGNOSIS — L89324 Pressure ulcer of left buttock, stage 4: Secondary | ICD-10-CM | POA: Diagnosis not present

## 2020-06-09 DIAGNOSIS — F0150 Vascular dementia without behavioral disturbance: Secondary | ICD-10-CM | POA: Diagnosis not present

## 2020-06-09 DIAGNOSIS — Z8616 Personal history of COVID-19: Secondary | ICD-10-CM | POA: Diagnosis not present

## 2020-06-09 DIAGNOSIS — I10 Essential (primary) hypertension: Secondary | ICD-10-CM | POA: Diagnosis not present

## 2020-06-09 DIAGNOSIS — E785 Hyperlipidemia, unspecified: Secondary | ICD-10-CM | POA: Diagnosis not present

## 2020-06-09 DIAGNOSIS — I69818 Other symptoms and signs involving cognitive functions following other cerebrovascular disease: Secondary | ICD-10-CM | POA: Diagnosis not present

## 2020-06-13 DIAGNOSIS — E785 Hyperlipidemia, unspecified: Secondary | ICD-10-CM | POA: Diagnosis not present

## 2020-06-13 DIAGNOSIS — L89324 Pressure ulcer of left buttock, stage 4: Secondary | ICD-10-CM | POA: Diagnosis not present

## 2020-06-13 DIAGNOSIS — F0150 Vascular dementia without behavioral disturbance: Secondary | ICD-10-CM | POA: Diagnosis not present

## 2020-06-13 DIAGNOSIS — Z8616 Personal history of COVID-19: Secondary | ICD-10-CM | POA: Diagnosis not present

## 2020-06-13 DIAGNOSIS — I10 Essential (primary) hypertension: Secondary | ICD-10-CM | POA: Diagnosis not present

## 2020-06-13 DIAGNOSIS — I69818 Other symptoms and signs involving cognitive functions following other cerebrovascular disease: Secondary | ICD-10-CM | POA: Diagnosis not present

## 2020-06-14 DIAGNOSIS — Z8616 Personal history of COVID-19: Secondary | ICD-10-CM | POA: Diagnosis not present

## 2020-06-14 DIAGNOSIS — I10 Essential (primary) hypertension: Secondary | ICD-10-CM | POA: Diagnosis not present

## 2020-06-14 DIAGNOSIS — E785 Hyperlipidemia, unspecified: Secondary | ICD-10-CM | POA: Diagnosis not present

## 2020-06-14 DIAGNOSIS — L89324 Pressure ulcer of left buttock, stage 4: Secondary | ICD-10-CM | POA: Diagnosis not present

## 2020-06-14 DIAGNOSIS — I69818 Other symptoms and signs involving cognitive functions following other cerebrovascular disease: Secondary | ICD-10-CM | POA: Diagnosis not present

## 2020-06-14 DIAGNOSIS — F0150 Vascular dementia without behavioral disturbance: Secondary | ICD-10-CM | POA: Diagnosis not present

## 2020-06-16 DIAGNOSIS — Z8616 Personal history of COVID-19: Secondary | ICD-10-CM | POA: Diagnosis not present

## 2020-06-16 DIAGNOSIS — L89324 Pressure ulcer of left buttock, stage 4: Secondary | ICD-10-CM | POA: Diagnosis not present

## 2020-06-16 DIAGNOSIS — I10 Essential (primary) hypertension: Secondary | ICD-10-CM | POA: Diagnosis not present

## 2020-06-16 DIAGNOSIS — I69818 Other symptoms and signs involving cognitive functions following other cerebrovascular disease: Secondary | ICD-10-CM | POA: Diagnosis not present

## 2020-06-16 DIAGNOSIS — E785 Hyperlipidemia, unspecified: Secondary | ICD-10-CM | POA: Diagnosis not present

## 2020-06-16 DIAGNOSIS — F0150 Vascular dementia without behavioral disturbance: Secondary | ICD-10-CM | POA: Diagnosis not present

## 2020-06-17 DIAGNOSIS — Z8616 Personal history of COVID-19: Secondary | ICD-10-CM | POA: Diagnosis not present

## 2020-06-17 DIAGNOSIS — F0150 Vascular dementia without behavioral disturbance: Secondary | ICD-10-CM | POA: Diagnosis not present

## 2020-06-17 DIAGNOSIS — L89324 Pressure ulcer of left buttock, stage 4: Secondary | ICD-10-CM | POA: Diagnosis not present

## 2020-06-17 DIAGNOSIS — E785 Hyperlipidemia, unspecified: Secondary | ICD-10-CM | POA: Diagnosis not present

## 2020-06-17 DIAGNOSIS — I69818 Other symptoms and signs involving cognitive functions following other cerebrovascular disease: Secondary | ICD-10-CM | POA: Diagnosis not present

## 2020-06-17 DIAGNOSIS — I10 Essential (primary) hypertension: Secondary | ICD-10-CM | POA: Diagnosis not present

## 2020-06-19 DIAGNOSIS — F0150 Vascular dementia without behavioral disturbance: Secondary | ICD-10-CM | POA: Diagnosis not present

## 2020-06-19 DIAGNOSIS — L89324 Pressure ulcer of left buttock, stage 4: Secondary | ICD-10-CM | POA: Diagnosis not present

## 2020-06-19 DIAGNOSIS — I10 Essential (primary) hypertension: Secondary | ICD-10-CM | POA: Diagnosis not present

## 2020-06-19 DIAGNOSIS — I69818 Other symptoms and signs involving cognitive functions following other cerebrovascular disease: Secondary | ICD-10-CM | POA: Diagnosis not present

## 2020-06-19 DIAGNOSIS — Z8616 Personal history of COVID-19: Secondary | ICD-10-CM | POA: Diagnosis not present

## 2020-06-19 DIAGNOSIS — E785 Hyperlipidemia, unspecified: Secondary | ICD-10-CM | POA: Diagnosis not present

## 2020-06-20 DIAGNOSIS — E785 Hyperlipidemia, unspecified: Secondary | ICD-10-CM | POA: Diagnosis not present

## 2020-06-20 DIAGNOSIS — I69818 Other symptoms and signs involving cognitive functions following other cerebrovascular disease: Secondary | ICD-10-CM | POA: Diagnosis not present

## 2020-06-20 DIAGNOSIS — L89324 Pressure ulcer of left buttock, stage 4: Secondary | ICD-10-CM | POA: Diagnosis not present

## 2020-06-20 DIAGNOSIS — F0150 Vascular dementia without behavioral disturbance: Secondary | ICD-10-CM | POA: Diagnosis not present

## 2020-06-20 DIAGNOSIS — I10 Essential (primary) hypertension: Secondary | ICD-10-CM | POA: Diagnosis not present

## 2020-06-20 DIAGNOSIS — Z8616 Personal history of COVID-19: Secondary | ICD-10-CM | POA: Diagnosis not present

## 2020-06-23 DIAGNOSIS — L89324 Pressure ulcer of left buttock, stage 4: Secondary | ICD-10-CM | POA: Diagnosis not present

## 2020-06-23 DIAGNOSIS — I69818 Other symptoms and signs involving cognitive functions following other cerebrovascular disease: Secondary | ICD-10-CM | POA: Diagnosis not present

## 2020-06-23 DIAGNOSIS — E785 Hyperlipidemia, unspecified: Secondary | ICD-10-CM | POA: Diagnosis not present

## 2020-06-23 DIAGNOSIS — F0150 Vascular dementia without behavioral disturbance: Secondary | ICD-10-CM | POA: Diagnosis not present

## 2020-06-23 DIAGNOSIS — I10 Essential (primary) hypertension: Secondary | ICD-10-CM | POA: Diagnosis not present

## 2020-06-23 DIAGNOSIS — Z8616 Personal history of COVID-19: Secondary | ICD-10-CM | POA: Diagnosis not present

## 2020-06-24 DIAGNOSIS — Z8616 Personal history of COVID-19: Secondary | ICD-10-CM | POA: Diagnosis not present

## 2020-06-24 DIAGNOSIS — F0150 Vascular dementia without behavioral disturbance: Secondary | ICD-10-CM | POA: Diagnosis not present

## 2020-06-24 DIAGNOSIS — E785 Hyperlipidemia, unspecified: Secondary | ICD-10-CM | POA: Diagnosis not present

## 2020-06-24 DIAGNOSIS — L89324 Pressure ulcer of left buttock, stage 4: Secondary | ICD-10-CM | POA: Diagnosis not present

## 2020-06-24 DIAGNOSIS — I10 Essential (primary) hypertension: Secondary | ICD-10-CM | POA: Diagnosis not present

## 2020-06-24 DIAGNOSIS — I69818 Other symptoms and signs involving cognitive functions following other cerebrovascular disease: Secondary | ICD-10-CM | POA: Diagnosis not present

## 2020-06-25 DIAGNOSIS — I10 Essential (primary) hypertension: Secondary | ICD-10-CM | POA: Diagnosis not present

## 2020-06-25 DIAGNOSIS — E785 Hyperlipidemia, unspecified: Secondary | ICD-10-CM | POA: Diagnosis not present

## 2020-06-25 DIAGNOSIS — Z8616 Personal history of COVID-19: Secondary | ICD-10-CM | POA: Diagnosis not present

## 2020-06-25 DIAGNOSIS — L03119 Cellulitis of unspecified part of limb: Secondary | ICD-10-CM | POA: Diagnosis not present

## 2020-06-25 DIAGNOSIS — I69818 Other symptoms and signs involving cognitive functions following other cerebrovascular disease: Secondary | ICD-10-CM | POA: Diagnosis not present

## 2020-06-25 DIAGNOSIS — L89324 Pressure ulcer of left buttock, stage 4: Secondary | ICD-10-CM | POA: Diagnosis not present

## 2020-06-25 DIAGNOSIS — F0150 Vascular dementia without behavioral disturbance: Secondary | ICD-10-CM | POA: Diagnosis not present

## 2020-06-25 DIAGNOSIS — Z7401 Bed confinement status: Secondary | ICD-10-CM | POA: Diagnosis not present

## 2020-06-26 DIAGNOSIS — I10 Essential (primary) hypertension: Secondary | ICD-10-CM | POA: Diagnosis not present

## 2020-06-26 DIAGNOSIS — E785 Hyperlipidemia, unspecified: Secondary | ICD-10-CM | POA: Diagnosis not present

## 2020-06-26 DIAGNOSIS — I69818 Other symptoms and signs involving cognitive functions following other cerebrovascular disease: Secondary | ICD-10-CM | POA: Diagnosis not present

## 2020-06-26 DIAGNOSIS — L03119 Cellulitis of unspecified part of limb: Secondary | ICD-10-CM | POA: Diagnosis not present

## 2020-06-26 DIAGNOSIS — F0150 Vascular dementia without behavioral disturbance: Secondary | ICD-10-CM | POA: Diagnosis not present

## 2020-06-26 DIAGNOSIS — L89324 Pressure ulcer of left buttock, stage 4: Secondary | ICD-10-CM | POA: Diagnosis not present

## 2020-06-27 DIAGNOSIS — F0150 Vascular dementia without behavioral disturbance: Secondary | ICD-10-CM | POA: Diagnosis not present

## 2020-06-27 DIAGNOSIS — I10 Essential (primary) hypertension: Secondary | ICD-10-CM | POA: Diagnosis not present

## 2020-06-27 DIAGNOSIS — L03119 Cellulitis of unspecified part of limb: Secondary | ICD-10-CM | POA: Diagnosis not present

## 2020-06-27 DIAGNOSIS — I69818 Other symptoms and signs involving cognitive functions following other cerebrovascular disease: Secondary | ICD-10-CM | POA: Diagnosis not present

## 2020-06-27 DIAGNOSIS — L89324 Pressure ulcer of left buttock, stage 4: Secondary | ICD-10-CM | POA: Diagnosis not present

## 2020-06-27 DIAGNOSIS — E785 Hyperlipidemia, unspecified: Secondary | ICD-10-CM | POA: Diagnosis not present

## 2020-06-30 DIAGNOSIS — L03119 Cellulitis of unspecified part of limb: Secondary | ICD-10-CM | POA: Diagnosis not present

## 2020-06-30 DIAGNOSIS — I69818 Other symptoms and signs involving cognitive functions following other cerebrovascular disease: Secondary | ICD-10-CM | POA: Diagnosis not present

## 2020-06-30 DIAGNOSIS — I10 Essential (primary) hypertension: Secondary | ICD-10-CM | POA: Diagnosis not present

## 2020-06-30 DIAGNOSIS — L89324 Pressure ulcer of left buttock, stage 4: Secondary | ICD-10-CM | POA: Diagnosis not present

## 2020-06-30 DIAGNOSIS — F0150 Vascular dementia without behavioral disturbance: Secondary | ICD-10-CM | POA: Diagnosis not present

## 2020-06-30 DIAGNOSIS — E785 Hyperlipidemia, unspecified: Secondary | ICD-10-CM | POA: Diagnosis not present

## 2020-07-03 DIAGNOSIS — L03119 Cellulitis of unspecified part of limb: Secondary | ICD-10-CM | POA: Diagnosis not present

## 2020-07-03 DIAGNOSIS — L89324 Pressure ulcer of left buttock, stage 4: Secondary | ICD-10-CM | POA: Diagnosis not present

## 2020-07-03 DIAGNOSIS — E785 Hyperlipidemia, unspecified: Secondary | ICD-10-CM | POA: Diagnosis not present

## 2020-07-03 DIAGNOSIS — I10 Essential (primary) hypertension: Secondary | ICD-10-CM | POA: Diagnosis not present

## 2020-07-03 DIAGNOSIS — I69818 Other symptoms and signs involving cognitive functions following other cerebrovascular disease: Secondary | ICD-10-CM | POA: Diagnosis not present

## 2020-07-03 DIAGNOSIS — F0150 Vascular dementia without behavioral disturbance: Secondary | ICD-10-CM | POA: Diagnosis not present

## 2020-07-04 DIAGNOSIS — E785 Hyperlipidemia, unspecified: Secondary | ICD-10-CM | POA: Diagnosis not present

## 2020-07-04 DIAGNOSIS — I69818 Other symptoms and signs involving cognitive functions following other cerebrovascular disease: Secondary | ICD-10-CM | POA: Diagnosis not present

## 2020-07-04 DIAGNOSIS — L03119 Cellulitis of unspecified part of limb: Secondary | ICD-10-CM | POA: Diagnosis not present

## 2020-07-04 DIAGNOSIS — L89324 Pressure ulcer of left buttock, stage 4: Secondary | ICD-10-CM | POA: Diagnosis not present

## 2020-07-04 DIAGNOSIS — I10 Essential (primary) hypertension: Secondary | ICD-10-CM | POA: Diagnosis not present

## 2020-07-04 DIAGNOSIS — F0150 Vascular dementia without behavioral disturbance: Secondary | ICD-10-CM | POA: Diagnosis not present

## 2020-07-07 DIAGNOSIS — L03119 Cellulitis of unspecified part of limb: Secondary | ICD-10-CM | POA: Diagnosis not present

## 2020-07-07 DIAGNOSIS — I10 Essential (primary) hypertension: Secondary | ICD-10-CM | POA: Diagnosis not present

## 2020-07-07 DIAGNOSIS — E785 Hyperlipidemia, unspecified: Secondary | ICD-10-CM | POA: Diagnosis not present

## 2020-07-07 DIAGNOSIS — F0150 Vascular dementia without behavioral disturbance: Secondary | ICD-10-CM | POA: Diagnosis not present

## 2020-07-07 DIAGNOSIS — I69818 Other symptoms and signs involving cognitive functions following other cerebrovascular disease: Secondary | ICD-10-CM | POA: Diagnosis not present

## 2020-07-07 DIAGNOSIS — L89324 Pressure ulcer of left buttock, stage 4: Secondary | ICD-10-CM | POA: Diagnosis not present

## 2020-07-09 DIAGNOSIS — L03119 Cellulitis of unspecified part of limb: Secondary | ICD-10-CM | POA: Diagnosis not present

## 2020-07-09 DIAGNOSIS — F0150 Vascular dementia without behavioral disturbance: Secondary | ICD-10-CM | POA: Diagnosis not present

## 2020-07-09 DIAGNOSIS — L89324 Pressure ulcer of left buttock, stage 4: Secondary | ICD-10-CM | POA: Diagnosis not present

## 2020-07-09 DIAGNOSIS — I10 Essential (primary) hypertension: Secondary | ICD-10-CM | POA: Diagnosis not present

## 2020-07-09 DIAGNOSIS — E785 Hyperlipidemia, unspecified: Secondary | ICD-10-CM | POA: Diagnosis not present

## 2020-07-09 DIAGNOSIS — I69818 Other symptoms and signs involving cognitive functions following other cerebrovascular disease: Secondary | ICD-10-CM | POA: Diagnosis not present

## 2020-07-10 DIAGNOSIS — L89324 Pressure ulcer of left buttock, stage 4: Secondary | ICD-10-CM | POA: Diagnosis not present

## 2020-07-10 DIAGNOSIS — L03119 Cellulitis of unspecified part of limb: Secondary | ICD-10-CM | POA: Diagnosis not present

## 2020-07-10 DIAGNOSIS — I10 Essential (primary) hypertension: Secondary | ICD-10-CM | POA: Diagnosis not present

## 2020-07-10 DIAGNOSIS — I69818 Other symptoms and signs involving cognitive functions following other cerebrovascular disease: Secondary | ICD-10-CM | POA: Diagnosis not present

## 2020-07-10 DIAGNOSIS — F0150 Vascular dementia without behavioral disturbance: Secondary | ICD-10-CM | POA: Diagnosis not present

## 2020-07-10 DIAGNOSIS — E785 Hyperlipidemia, unspecified: Secondary | ICD-10-CM | POA: Diagnosis not present

## 2020-07-11 DIAGNOSIS — I10 Essential (primary) hypertension: Secondary | ICD-10-CM | POA: Diagnosis not present

## 2020-07-11 DIAGNOSIS — E785 Hyperlipidemia, unspecified: Secondary | ICD-10-CM | POA: Diagnosis not present

## 2020-07-11 DIAGNOSIS — L89324 Pressure ulcer of left buttock, stage 4: Secondary | ICD-10-CM | POA: Diagnosis not present

## 2020-07-11 DIAGNOSIS — F0150 Vascular dementia without behavioral disturbance: Secondary | ICD-10-CM | POA: Diagnosis not present

## 2020-07-11 DIAGNOSIS — L03119 Cellulitis of unspecified part of limb: Secondary | ICD-10-CM | POA: Diagnosis not present

## 2020-07-11 DIAGNOSIS — I69818 Other symptoms and signs involving cognitive functions following other cerebrovascular disease: Secondary | ICD-10-CM | POA: Diagnosis not present

## 2020-07-14 DIAGNOSIS — L03119 Cellulitis of unspecified part of limb: Secondary | ICD-10-CM | POA: Diagnosis not present

## 2020-07-14 DIAGNOSIS — L89324 Pressure ulcer of left buttock, stage 4: Secondary | ICD-10-CM | POA: Diagnosis not present

## 2020-07-14 DIAGNOSIS — E785 Hyperlipidemia, unspecified: Secondary | ICD-10-CM | POA: Diagnosis not present

## 2020-07-14 DIAGNOSIS — F0150 Vascular dementia without behavioral disturbance: Secondary | ICD-10-CM | POA: Diagnosis not present

## 2020-07-14 DIAGNOSIS — I69818 Other symptoms and signs involving cognitive functions following other cerebrovascular disease: Secondary | ICD-10-CM | POA: Diagnosis not present

## 2020-07-14 DIAGNOSIS — I10 Essential (primary) hypertension: Secondary | ICD-10-CM | POA: Diagnosis not present

## 2020-07-17 DIAGNOSIS — I69818 Other symptoms and signs involving cognitive functions following other cerebrovascular disease: Secondary | ICD-10-CM | POA: Diagnosis not present

## 2020-07-17 DIAGNOSIS — E785 Hyperlipidemia, unspecified: Secondary | ICD-10-CM | POA: Diagnosis not present

## 2020-07-17 DIAGNOSIS — F0150 Vascular dementia without behavioral disturbance: Secondary | ICD-10-CM | POA: Diagnosis not present

## 2020-07-17 DIAGNOSIS — L03119 Cellulitis of unspecified part of limb: Secondary | ICD-10-CM | POA: Diagnosis not present

## 2020-07-17 DIAGNOSIS — I10 Essential (primary) hypertension: Secondary | ICD-10-CM | POA: Diagnosis not present

## 2020-07-17 DIAGNOSIS — L89324 Pressure ulcer of left buttock, stage 4: Secondary | ICD-10-CM | POA: Diagnosis not present

## 2020-07-18 DIAGNOSIS — L89324 Pressure ulcer of left buttock, stage 4: Secondary | ICD-10-CM | POA: Diagnosis not present

## 2020-07-18 DIAGNOSIS — E785 Hyperlipidemia, unspecified: Secondary | ICD-10-CM | POA: Diagnosis not present

## 2020-07-18 DIAGNOSIS — I69818 Other symptoms and signs involving cognitive functions following other cerebrovascular disease: Secondary | ICD-10-CM | POA: Diagnosis not present

## 2020-07-18 DIAGNOSIS — I10 Essential (primary) hypertension: Secondary | ICD-10-CM | POA: Diagnosis not present

## 2020-07-18 DIAGNOSIS — F0150 Vascular dementia without behavioral disturbance: Secondary | ICD-10-CM | POA: Diagnosis not present

## 2020-07-18 DIAGNOSIS — L03119 Cellulitis of unspecified part of limb: Secondary | ICD-10-CM | POA: Diagnosis not present

## 2020-07-21 DIAGNOSIS — I69818 Other symptoms and signs involving cognitive functions following other cerebrovascular disease: Secondary | ICD-10-CM | POA: Diagnosis not present

## 2020-07-21 DIAGNOSIS — L03119 Cellulitis of unspecified part of limb: Secondary | ICD-10-CM | POA: Diagnosis not present

## 2020-07-21 DIAGNOSIS — L89324 Pressure ulcer of left buttock, stage 4: Secondary | ICD-10-CM | POA: Diagnosis not present

## 2020-07-21 DIAGNOSIS — E785 Hyperlipidemia, unspecified: Secondary | ICD-10-CM | POA: Diagnosis not present

## 2020-07-21 DIAGNOSIS — F0150 Vascular dementia without behavioral disturbance: Secondary | ICD-10-CM | POA: Diagnosis not present

## 2020-07-21 DIAGNOSIS — I10 Essential (primary) hypertension: Secondary | ICD-10-CM | POA: Diagnosis not present

## 2020-07-22 DIAGNOSIS — L03119 Cellulitis of unspecified part of limb: Secondary | ICD-10-CM | POA: Diagnosis not present

## 2020-07-22 DIAGNOSIS — L89324 Pressure ulcer of left buttock, stage 4: Secondary | ICD-10-CM | POA: Diagnosis not present

## 2020-07-22 DIAGNOSIS — I69818 Other symptoms and signs involving cognitive functions following other cerebrovascular disease: Secondary | ICD-10-CM | POA: Diagnosis not present

## 2020-07-22 DIAGNOSIS — I10 Essential (primary) hypertension: Secondary | ICD-10-CM | POA: Diagnosis not present

## 2020-07-22 DIAGNOSIS — E785 Hyperlipidemia, unspecified: Secondary | ICD-10-CM | POA: Diagnosis not present

## 2020-07-22 DIAGNOSIS — F0150 Vascular dementia without behavioral disturbance: Secondary | ICD-10-CM | POA: Diagnosis not present

## 2020-07-24 DIAGNOSIS — I69818 Other symptoms and signs involving cognitive functions following other cerebrovascular disease: Secondary | ICD-10-CM | POA: Diagnosis not present

## 2020-07-24 DIAGNOSIS — E785 Hyperlipidemia, unspecified: Secondary | ICD-10-CM | POA: Diagnosis not present

## 2020-07-24 DIAGNOSIS — I10 Essential (primary) hypertension: Secondary | ICD-10-CM | POA: Diagnosis not present

## 2020-07-24 DIAGNOSIS — L89324 Pressure ulcer of left buttock, stage 4: Secondary | ICD-10-CM | POA: Diagnosis not present

## 2020-07-24 DIAGNOSIS — F0150 Vascular dementia without behavioral disturbance: Secondary | ICD-10-CM | POA: Diagnosis not present

## 2020-07-24 DIAGNOSIS — L03119 Cellulitis of unspecified part of limb: Secondary | ICD-10-CM | POA: Diagnosis not present

## 2020-07-25 DIAGNOSIS — Z8616 Personal history of COVID-19: Secondary | ICD-10-CM | POA: Diagnosis not present

## 2020-07-25 DIAGNOSIS — L03119 Cellulitis of unspecified part of limb: Secondary | ICD-10-CM | POA: Diagnosis not present

## 2020-07-25 DIAGNOSIS — E785 Hyperlipidemia, unspecified: Secondary | ICD-10-CM | POA: Diagnosis not present

## 2020-07-25 DIAGNOSIS — Z7401 Bed confinement status: Secondary | ICD-10-CM | POA: Diagnosis not present

## 2020-07-25 DIAGNOSIS — L89324 Pressure ulcer of left buttock, stage 4: Secondary | ICD-10-CM | POA: Diagnosis not present

## 2020-07-25 DIAGNOSIS — I10 Essential (primary) hypertension: Secondary | ICD-10-CM | POA: Diagnosis not present

## 2020-07-25 DIAGNOSIS — F0150 Vascular dementia without behavioral disturbance: Secondary | ICD-10-CM | POA: Diagnosis not present

## 2020-07-25 DIAGNOSIS — I69818 Other symptoms and signs involving cognitive functions following other cerebrovascular disease: Secondary | ICD-10-CM | POA: Diagnosis not present

## 2020-07-28 DIAGNOSIS — I69818 Other symptoms and signs involving cognitive functions following other cerebrovascular disease: Secondary | ICD-10-CM | POA: Diagnosis not present

## 2020-07-28 DIAGNOSIS — F0150 Vascular dementia without behavioral disturbance: Secondary | ICD-10-CM | POA: Diagnosis not present

## 2020-07-28 DIAGNOSIS — I10 Essential (primary) hypertension: Secondary | ICD-10-CM | POA: Diagnosis not present

## 2020-07-28 DIAGNOSIS — L03119 Cellulitis of unspecified part of limb: Secondary | ICD-10-CM | POA: Diagnosis not present

## 2020-07-28 DIAGNOSIS — L89324 Pressure ulcer of left buttock, stage 4: Secondary | ICD-10-CM | POA: Diagnosis not present

## 2020-07-28 DIAGNOSIS — E785 Hyperlipidemia, unspecified: Secondary | ICD-10-CM | POA: Diagnosis not present

## 2020-07-31 DIAGNOSIS — I69818 Other symptoms and signs involving cognitive functions following other cerebrovascular disease: Secondary | ICD-10-CM | POA: Diagnosis not present

## 2020-07-31 DIAGNOSIS — F0150 Vascular dementia without behavioral disturbance: Secondary | ICD-10-CM | POA: Diagnosis not present

## 2020-07-31 DIAGNOSIS — L89324 Pressure ulcer of left buttock, stage 4: Secondary | ICD-10-CM | POA: Diagnosis not present

## 2020-07-31 DIAGNOSIS — L03119 Cellulitis of unspecified part of limb: Secondary | ICD-10-CM | POA: Diagnosis not present

## 2020-07-31 DIAGNOSIS — I10 Essential (primary) hypertension: Secondary | ICD-10-CM | POA: Diagnosis not present

## 2020-07-31 DIAGNOSIS — E785 Hyperlipidemia, unspecified: Secondary | ICD-10-CM | POA: Diagnosis not present

## 2020-08-01 DIAGNOSIS — I69818 Other symptoms and signs involving cognitive functions following other cerebrovascular disease: Secondary | ICD-10-CM | POA: Diagnosis not present

## 2020-08-01 DIAGNOSIS — E785 Hyperlipidemia, unspecified: Secondary | ICD-10-CM | POA: Diagnosis not present

## 2020-08-01 DIAGNOSIS — F0150 Vascular dementia without behavioral disturbance: Secondary | ICD-10-CM | POA: Diagnosis not present

## 2020-08-01 DIAGNOSIS — L03119 Cellulitis of unspecified part of limb: Secondary | ICD-10-CM | POA: Diagnosis not present

## 2020-08-01 DIAGNOSIS — I10 Essential (primary) hypertension: Secondary | ICD-10-CM | POA: Diagnosis not present

## 2020-08-01 DIAGNOSIS — L89324 Pressure ulcer of left buttock, stage 4: Secondary | ICD-10-CM | POA: Diagnosis not present

## 2020-08-04 DIAGNOSIS — L89324 Pressure ulcer of left buttock, stage 4: Secondary | ICD-10-CM | POA: Diagnosis not present

## 2020-08-04 DIAGNOSIS — L03119 Cellulitis of unspecified part of limb: Secondary | ICD-10-CM | POA: Diagnosis not present

## 2020-08-04 DIAGNOSIS — I69818 Other symptoms and signs involving cognitive functions following other cerebrovascular disease: Secondary | ICD-10-CM | POA: Diagnosis not present

## 2020-08-04 DIAGNOSIS — E785 Hyperlipidemia, unspecified: Secondary | ICD-10-CM | POA: Diagnosis not present

## 2020-08-04 DIAGNOSIS — I10 Essential (primary) hypertension: Secondary | ICD-10-CM | POA: Diagnosis not present

## 2020-08-04 DIAGNOSIS — F0150 Vascular dementia without behavioral disturbance: Secondary | ICD-10-CM | POA: Diagnosis not present

## 2020-08-07 DIAGNOSIS — F0150 Vascular dementia without behavioral disturbance: Secondary | ICD-10-CM | POA: Diagnosis not present

## 2020-08-07 DIAGNOSIS — I69818 Other symptoms and signs involving cognitive functions following other cerebrovascular disease: Secondary | ICD-10-CM | POA: Diagnosis not present

## 2020-08-07 DIAGNOSIS — L89324 Pressure ulcer of left buttock, stage 4: Secondary | ICD-10-CM | POA: Diagnosis not present

## 2020-08-07 DIAGNOSIS — L03119 Cellulitis of unspecified part of limb: Secondary | ICD-10-CM | POA: Diagnosis not present

## 2020-08-07 DIAGNOSIS — I10 Essential (primary) hypertension: Secondary | ICD-10-CM | POA: Diagnosis not present

## 2020-08-07 DIAGNOSIS — E785 Hyperlipidemia, unspecified: Secondary | ICD-10-CM | POA: Diagnosis not present

## 2020-08-08 DIAGNOSIS — F0150 Vascular dementia without behavioral disturbance: Secondary | ICD-10-CM | POA: Diagnosis not present

## 2020-08-08 DIAGNOSIS — L89324 Pressure ulcer of left buttock, stage 4: Secondary | ICD-10-CM | POA: Diagnosis not present

## 2020-08-08 DIAGNOSIS — I10 Essential (primary) hypertension: Secondary | ICD-10-CM | POA: Diagnosis not present

## 2020-08-08 DIAGNOSIS — E785 Hyperlipidemia, unspecified: Secondary | ICD-10-CM | POA: Diagnosis not present

## 2020-08-08 DIAGNOSIS — I69818 Other symptoms and signs involving cognitive functions following other cerebrovascular disease: Secondary | ICD-10-CM | POA: Diagnosis not present

## 2020-08-08 DIAGNOSIS — L03119 Cellulitis of unspecified part of limb: Secondary | ICD-10-CM | POA: Diagnosis not present

## 2020-08-11 DIAGNOSIS — E785 Hyperlipidemia, unspecified: Secondary | ICD-10-CM | POA: Diagnosis not present

## 2020-08-11 DIAGNOSIS — L03119 Cellulitis of unspecified part of limb: Secondary | ICD-10-CM | POA: Diagnosis not present

## 2020-08-11 DIAGNOSIS — I10 Essential (primary) hypertension: Secondary | ICD-10-CM | POA: Diagnosis not present

## 2020-08-11 DIAGNOSIS — L89324 Pressure ulcer of left buttock, stage 4: Secondary | ICD-10-CM | POA: Diagnosis not present

## 2020-08-11 DIAGNOSIS — I69818 Other symptoms and signs involving cognitive functions following other cerebrovascular disease: Secondary | ICD-10-CM | POA: Diagnosis not present

## 2020-08-11 DIAGNOSIS — F0150 Vascular dementia without behavioral disturbance: Secondary | ICD-10-CM | POA: Diagnosis not present

## 2020-08-14 DIAGNOSIS — L89324 Pressure ulcer of left buttock, stage 4: Secondary | ICD-10-CM | POA: Diagnosis not present

## 2020-08-14 DIAGNOSIS — E785 Hyperlipidemia, unspecified: Secondary | ICD-10-CM | POA: Diagnosis not present

## 2020-08-14 DIAGNOSIS — I69818 Other symptoms and signs involving cognitive functions following other cerebrovascular disease: Secondary | ICD-10-CM | POA: Diagnosis not present

## 2020-08-14 DIAGNOSIS — I10 Essential (primary) hypertension: Secondary | ICD-10-CM | POA: Diagnosis not present

## 2020-08-14 DIAGNOSIS — L03119 Cellulitis of unspecified part of limb: Secondary | ICD-10-CM | POA: Diagnosis not present

## 2020-08-14 DIAGNOSIS — F0150 Vascular dementia without behavioral disturbance: Secondary | ICD-10-CM | POA: Diagnosis not present

## 2020-08-16 DIAGNOSIS — I69818 Other symptoms and signs involving cognitive functions following other cerebrovascular disease: Secondary | ICD-10-CM | POA: Diagnosis not present

## 2020-08-16 DIAGNOSIS — E785 Hyperlipidemia, unspecified: Secondary | ICD-10-CM | POA: Diagnosis not present

## 2020-08-16 DIAGNOSIS — I10 Essential (primary) hypertension: Secondary | ICD-10-CM | POA: Diagnosis not present

## 2020-08-16 DIAGNOSIS — F0150 Vascular dementia without behavioral disturbance: Secondary | ICD-10-CM | POA: Diagnosis not present

## 2020-08-16 DIAGNOSIS — L03119 Cellulitis of unspecified part of limb: Secondary | ICD-10-CM | POA: Diagnosis not present

## 2020-08-16 DIAGNOSIS — L89324 Pressure ulcer of left buttock, stage 4: Secondary | ICD-10-CM | POA: Diagnosis not present

## 2020-08-18 DIAGNOSIS — I10 Essential (primary) hypertension: Secondary | ICD-10-CM | POA: Diagnosis not present

## 2020-08-18 DIAGNOSIS — E785 Hyperlipidemia, unspecified: Secondary | ICD-10-CM | POA: Diagnosis not present

## 2020-08-18 DIAGNOSIS — L89324 Pressure ulcer of left buttock, stage 4: Secondary | ICD-10-CM | POA: Diagnosis not present

## 2020-08-18 DIAGNOSIS — F0150 Vascular dementia without behavioral disturbance: Secondary | ICD-10-CM | POA: Diagnosis not present

## 2020-08-18 DIAGNOSIS — I69818 Other symptoms and signs involving cognitive functions following other cerebrovascular disease: Secondary | ICD-10-CM | POA: Diagnosis not present

## 2020-08-18 DIAGNOSIS — L03119 Cellulitis of unspecified part of limb: Secondary | ICD-10-CM | POA: Diagnosis not present

## 2020-08-21 DIAGNOSIS — I69818 Other symptoms and signs involving cognitive functions following other cerebrovascular disease: Secondary | ICD-10-CM | POA: Diagnosis not present

## 2020-08-21 DIAGNOSIS — F0150 Vascular dementia without behavioral disturbance: Secondary | ICD-10-CM | POA: Diagnosis not present

## 2020-08-21 DIAGNOSIS — L03119 Cellulitis of unspecified part of limb: Secondary | ICD-10-CM | POA: Diagnosis not present

## 2020-08-21 DIAGNOSIS — E785 Hyperlipidemia, unspecified: Secondary | ICD-10-CM | POA: Diagnosis not present

## 2020-08-21 DIAGNOSIS — L89324 Pressure ulcer of left buttock, stage 4: Secondary | ICD-10-CM | POA: Diagnosis not present

## 2020-08-21 DIAGNOSIS — I10 Essential (primary) hypertension: Secondary | ICD-10-CM | POA: Diagnosis not present

## 2020-08-22 DIAGNOSIS — F0150 Vascular dementia without behavioral disturbance: Secondary | ICD-10-CM | POA: Diagnosis not present

## 2020-08-22 DIAGNOSIS — I69818 Other symptoms and signs involving cognitive functions following other cerebrovascular disease: Secondary | ICD-10-CM | POA: Diagnosis not present

## 2020-08-22 DIAGNOSIS — E785 Hyperlipidemia, unspecified: Secondary | ICD-10-CM | POA: Diagnosis not present

## 2020-08-22 DIAGNOSIS — L89324 Pressure ulcer of left buttock, stage 4: Secondary | ICD-10-CM | POA: Diagnosis not present

## 2020-08-22 DIAGNOSIS — I10 Essential (primary) hypertension: Secondary | ICD-10-CM | POA: Diagnosis not present

## 2020-08-22 DIAGNOSIS — L03119 Cellulitis of unspecified part of limb: Secondary | ICD-10-CM | POA: Diagnosis not present

## 2020-08-25 DIAGNOSIS — Z7401 Bed confinement status: Secondary | ICD-10-CM | POA: Diagnosis not present

## 2020-08-25 DIAGNOSIS — L03119 Cellulitis of unspecified part of limb: Secondary | ICD-10-CM | POA: Diagnosis not present

## 2020-08-25 DIAGNOSIS — I10 Essential (primary) hypertension: Secondary | ICD-10-CM | POA: Diagnosis not present

## 2020-08-25 DIAGNOSIS — Z8616 Personal history of COVID-19: Secondary | ICD-10-CM | POA: Diagnosis not present

## 2020-08-25 DIAGNOSIS — F0150 Vascular dementia without behavioral disturbance: Secondary | ICD-10-CM | POA: Diagnosis not present

## 2020-08-25 DIAGNOSIS — E785 Hyperlipidemia, unspecified: Secondary | ICD-10-CM | POA: Diagnosis not present

## 2020-08-25 DIAGNOSIS — L89324 Pressure ulcer of left buttock, stage 4: Secondary | ICD-10-CM | POA: Diagnosis not present

## 2020-08-25 DIAGNOSIS — I69818 Other symptoms and signs involving cognitive functions following other cerebrovascular disease: Secondary | ICD-10-CM | POA: Diagnosis not present

## 2020-08-26 ENCOUNTER — Ambulatory Visit: Payer: Medicare Other

## 2020-08-28 DIAGNOSIS — I10 Essential (primary) hypertension: Secondary | ICD-10-CM | POA: Diagnosis not present

## 2020-08-28 DIAGNOSIS — L89324 Pressure ulcer of left buttock, stage 4: Secondary | ICD-10-CM | POA: Diagnosis not present

## 2020-08-28 DIAGNOSIS — L03119 Cellulitis of unspecified part of limb: Secondary | ICD-10-CM | POA: Diagnosis not present

## 2020-08-28 DIAGNOSIS — F0150 Vascular dementia without behavioral disturbance: Secondary | ICD-10-CM | POA: Diagnosis not present

## 2020-08-28 DIAGNOSIS — E785 Hyperlipidemia, unspecified: Secondary | ICD-10-CM | POA: Diagnosis not present

## 2020-08-28 DIAGNOSIS — I69818 Other symptoms and signs involving cognitive functions following other cerebrovascular disease: Secondary | ICD-10-CM | POA: Diagnosis not present

## 2020-08-29 DIAGNOSIS — L89324 Pressure ulcer of left buttock, stage 4: Secondary | ICD-10-CM | POA: Diagnosis not present

## 2020-08-29 DIAGNOSIS — L03119 Cellulitis of unspecified part of limb: Secondary | ICD-10-CM | POA: Diagnosis not present

## 2020-08-29 DIAGNOSIS — I69818 Other symptoms and signs involving cognitive functions following other cerebrovascular disease: Secondary | ICD-10-CM | POA: Diagnosis not present

## 2020-08-29 DIAGNOSIS — I10 Essential (primary) hypertension: Secondary | ICD-10-CM | POA: Diagnosis not present

## 2020-08-29 DIAGNOSIS — E785 Hyperlipidemia, unspecified: Secondary | ICD-10-CM | POA: Diagnosis not present

## 2020-08-29 DIAGNOSIS — F0150 Vascular dementia without behavioral disturbance: Secondary | ICD-10-CM | POA: Diagnosis not present

## 2020-09-01 DIAGNOSIS — F0150 Vascular dementia without behavioral disturbance: Secondary | ICD-10-CM | POA: Diagnosis not present

## 2020-09-01 DIAGNOSIS — I69818 Other symptoms and signs involving cognitive functions following other cerebrovascular disease: Secondary | ICD-10-CM | POA: Diagnosis not present

## 2020-09-01 DIAGNOSIS — L89324 Pressure ulcer of left buttock, stage 4: Secondary | ICD-10-CM | POA: Diagnosis not present

## 2020-09-01 DIAGNOSIS — E785 Hyperlipidemia, unspecified: Secondary | ICD-10-CM | POA: Diagnosis not present

## 2020-09-01 DIAGNOSIS — I10 Essential (primary) hypertension: Secondary | ICD-10-CM | POA: Diagnosis not present

## 2020-09-01 DIAGNOSIS — L03119 Cellulitis of unspecified part of limb: Secondary | ICD-10-CM | POA: Diagnosis not present

## 2020-09-04 DIAGNOSIS — L03119 Cellulitis of unspecified part of limb: Secondary | ICD-10-CM | POA: Diagnosis not present

## 2020-09-04 DIAGNOSIS — I10 Essential (primary) hypertension: Secondary | ICD-10-CM | POA: Diagnosis not present

## 2020-09-04 DIAGNOSIS — F0150 Vascular dementia without behavioral disturbance: Secondary | ICD-10-CM | POA: Diagnosis not present

## 2020-09-04 DIAGNOSIS — E785 Hyperlipidemia, unspecified: Secondary | ICD-10-CM | POA: Diagnosis not present

## 2020-09-04 DIAGNOSIS — L89324 Pressure ulcer of left buttock, stage 4: Secondary | ICD-10-CM | POA: Diagnosis not present

## 2020-09-04 DIAGNOSIS — I69818 Other symptoms and signs involving cognitive functions following other cerebrovascular disease: Secondary | ICD-10-CM | POA: Diagnosis not present

## 2020-09-05 DIAGNOSIS — F0150 Vascular dementia without behavioral disturbance: Secondary | ICD-10-CM | POA: Diagnosis not present

## 2020-09-05 DIAGNOSIS — L89324 Pressure ulcer of left buttock, stage 4: Secondary | ICD-10-CM | POA: Diagnosis not present

## 2020-09-05 DIAGNOSIS — L03119 Cellulitis of unspecified part of limb: Secondary | ICD-10-CM | POA: Diagnosis not present

## 2020-09-05 DIAGNOSIS — I10 Essential (primary) hypertension: Secondary | ICD-10-CM | POA: Diagnosis not present

## 2020-09-05 DIAGNOSIS — E785 Hyperlipidemia, unspecified: Secondary | ICD-10-CM | POA: Diagnosis not present

## 2020-09-05 DIAGNOSIS — I69818 Other symptoms and signs involving cognitive functions following other cerebrovascular disease: Secondary | ICD-10-CM | POA: Diagnosis not present

## 2020-09-08 DIAGNOSIS — I10 Essential (primary) hypertension: Secondary | ICD-10-CM | POA: Diagnosis not present

## 2020-09-08 DIAGNOSIS — L89324 Pressure ulcer of left buttock, stage 4: Secondary | ICD-10-CM | POA: Diagnosis not present

## 2020-09-08 DIAGNOSIS — E785 Hyperlipidemia, unspecified: Secondary | ICD-10-CM | POA: Diagnosis not present

## 2020-09-08 DIAGNOSIS — I69818 Other symptoms and signs involving cognitive functions following other cerebrovascular disease: Secondary | ICD-10-CM | POA: Diagnosis not present

## 2020-09-08 DIAGNOSIS — F0150 Vascular dementia without behavioral disturbance: Secondary | ICD-10-CM | POA: Diagnosis not present

## 2020-09-08 DIAGNOSIS — L03119 Cellulitis of unspecified part of limb: Secondary | ICD-10-CM | POA: Diagnosis not present

## 2020-09-09 DIAGNOSIS — I10 Essential (primary) hypertension: Secondary | ICD-10-CM | POA: Diagnosis not present

## 2020-09-09 DIAGNOSIS — L03119 Cellulitis of unspecified part of limb: Secondary | ICD-10-CM | POA: Diagnosis not present

## 2020-09-09 DIAGNOSIS — L89324 Pressure ulcer of left buttock, stage 4: Secondary | ICD-10-CM | POA: Diagnosis not present

## 2020-09-09 DIAGNOSIS — F0150 Vascular dementia without behavioral disturbance: Secondary | ICD-10-CM | POA: Diagnosis not present

## 2020-09-09 DIAGNOSIS — I69818 Other symptoms and signs involving cognitive functions following other cerebrovascular disease: Secondary | ICD-10-CM | POA: Diagnosis not present

## 2020-09-09 DIAGNOSIS — E785 Hyperlipidemia, unspecified: Secondary | ICD-10-CM | POA: Diagnosis not present

## 2020-09-10 DIAGNOSIS — I69818 Other symptoms and signs involving cognitive functions following other cerebrovascular disease: Secondary | ICD-10-CM | POA: Diagnosis not present

## 2020-09-10 DIAGNOSIS — I10 Essential (primary) hypertension: Secondary | ICD-10-CM | POA: Diagnosis not present

## 2020-09-10 DIAGNOSIS — F0150 Vascular dementia without behavioral disturbance: Secondary | ICD-10-CM | POA: Diagnosis not present

## 2020-09-10 DIAGNOSIS — L89324 Pressure ulcer of left buttock, stage 4: Secondary | ICD-10-CM | POA: Diagnosis not present

## 2020-09-10 DIAGNOSIS — E785 Hyperlipidemia, unspecified: Secondary | ICD-10-CM | POA: Diagnosis not present

## 2020-09-10 DIAGNOSIS — L03119 Cellulitis of unspecified part of limb: Secondary | ICD-10-CM | POA: Diagnosis not present

## 2020-09-12 DIAGNOSIS — L89324 Pressure ulcer of left buttock, stage 4: Secondary | ICD-10-CM | POA: Diagnosis not present

## 2020-09-12 DIAGNOSIS — I69818 Other symptoms and signs involving cognitive functions following other cerebrovascular disease: Secondary | ICD-10-CM | POA: Diagnosis not present

## 2020-09-12 DIAGNOSIS — I10 Essential (primary) hypertension: Secondary | ICD-10-CM | POA: Diagnosis not present

## 2020-09-12 DIAGNOSIS — F0150 Vascular dementia without behavioral disturbance: Secondary | ICD-10-CM | POA: Diagnosis not present

## 2020-09-12 DIAGNOSIS — L03119 Cellulitis of unspecified part of limb: Secondary | ICD-10-CM | POA: Diagnosis not present

## 2020-09-12 DIAGNOSIS — E785 Hyperlipidemia, unspecified: Secondary | ICD-10-CM | POA: Diagnosis not present

## 2020-09-15 DIAGNOSIS — L03119 Cellulitis of unspecified part of limb: Secondary | ICD-10-CM | POA: Diagnosis not present

## 2020-09-15 DIAGNOSIS — E785 Hyperlipidemia, unspecified: Secondary | ICD-10-CM | POA: Diagnosis not present

## 2020-09-15 DIAGNOSIS — L89324 Pressure ulcer of left buttock, stage 4: Secondary | ICD-10-CM | POA: Diagnosis not present

## 2020-09-15 DIAGNOSIS — F0150 Vascular dementia without behavioral disturbance: Secondary | ICD-10-CM | POA: Diagnosis not present

## 2020-09-15 DIAGNOSIS — I69818 Other symptoms and signs involving cognitive functions following other cerebrovascular disease: Secondary | ICD-10-CM | POA: Diagnosis not present

## 2020-09-15 DIAGNOSIS — I10 Essential (primary) hypertension: Secondary | ICD-10-CM | POA: Diagnosis not present

## 2020-09-16 DIAGNOSIS — L89324 Pressure ulcer of left buttock, stage 4: Secondary | ICD-10-CM | POA: Diagnosis not present

## 2020-09-16 DIAGNOSIS — I10 Essential (primary) hypertension: Secondary | ICD-10-CM | POA: Diagnosis not present

## 2020-09-16 DIAGNOSIS — E785 Hyperlipidemia, unspecified: Secondary | ICD-10-CM | POA: Diagnosis not present

## 2020-09-16 DIAGNOSIS — I69818 Other symptoms and signs involving cognitive functions following other cerebrovascular disease: Secondary | ICD-10-CM | POA: Diagnosis not present

## 2020-09-16 DIAGNOSIS — L03119 Cellulitis of unspecified part of limb: Secondary | ICD-10-CM | POA: Diagnosis not present

## 2020-09-16 DIAGNOSIS — F0150 Vascular dementia without behavioral disturbance: Secondary | ICD-10-CM | POA: Diagnosis not present

## 2020-09-17 DIAGNOSIS — L03119 Cellulitis of unspecified part of limb: Secondary | ICD-10-CM | POA: Diagnosis not present

## 2020-09-17 DIAGNOSIS — I10 Essential (primary) hypertension: Secondary | ICD-10-CM | POA: Diagnosis not present

## 2020-09-17 DIAGNOSIS — E785 Hyperlipidemia, unspecified: Secondary | ICD-10-CM | POA: Diagnosis not present

## 2020-09-17 DIAGNOSIS — F0150 Vascular dementia without behavioral disturbance: Secondary | ICD-10-CM | POA: Diagnosis not present

## 2020-09-17 DIAGNOSIS — I69818 Other symptoms and signs involving cognitive functions following other cerebrovascular disease: Secondary | ICD-10-CM | POA: Diagnosis not present

## 2020-09-17 DIAGNOSIS — L89324 Pressure ulcer of left buttock, stage 4: Secondary | ICD-10-CM | POA: Diagnosis not present

## 2020-09-24 ENCOUNTER — Telehealth: Payer: Self-pay

## 2020-09-24 DIAGNOSIS — Z978 Presence of other specified devices: Secondary | ICD-10-CM | POA: Diagnosis not present

## 2020-09-24 DIAGNOSIS — R5381 Other malaise: Secondary | ICD-10-CM | POA: Diagnosis not present

## 2020-09-24 DIAGNOSIS — L8990 Pressure ulcer of unspecified site, unspecified stage: Secondary | ICD-10-CM | POA: Diagnosis not present

## 2020-09-24 DIAGNOSIS — G301 Alzheimer's disease with late onset: Secondary | ICD-10-CM | POA: Diagnosis not present

## 2020-09-24 DIAGNOSIS — R339 Retention of urine, unspecified: Secondary | ICD-10-CM | POA: Diagnosis not present

## 2020-09-24 DIAGNOSIS — M4800 Spinal stenosis, site unspecified: Secondary | ICD-10-CM | POA: Diagnosis not present

## 2020-09-24 NOTE — Telephone Encounter (Signed)
2:00pm-SW scheduled initial palliative care visit with patient. SW scheduled visit with her husband for 12/2 @ 2pm.

## 2020-09-25 ENCOUNTER — Other Ambulatory Visit: Payer: Self-pay

## 2020-09-25 ENCOUNTER — Other Ambulatory Visit: Payer: Medicare Other

## 2020-09-25 DIAGNOSIS — Z515 Encounter for palliative care: Secondary | ICD-10-CM

## 2020-09-26 ENCOUNTER — Telehealth: Payer: Self-pay | Admitting: *Deleted

## 2020-09-26 NOTE — Telephone Encounter (Signed)
Received a voicemail from patient's husband, Dr. Newton Pigg. Patient was discharged from hospice services last week for a greater than 6 month prognosis. He says that the patient has a foley catheter d/t urinary retention. He was inquiring as to what agency would be taking over catheter care now because prior to her hospice admission a home health nurse was managing this.  I called and spoke with Dr. Delene Ruffini office, receptionist, to advise of the above noted information. She says that Dr. Laurann Montana has already sent in the orders to a home health agency to manage the catheter. She says that the agency will be [facility] and they are supposed to be reaching out to spouse.   I called and spoke with Dr. Ulanda Edison to let him know this information. He is appreciative of return call.

## 2020-10-02 NOTE — Progress Notes (Signed)
COMMUNITY PALLIATIVE CARE SW NOTE  PATIENT NAME: Tammy Arnold DOB: 04-07-1941 MRN: [id number]  PRIMARY CARE PROVIDER: Kelton Pillar, MD  RESPONSIBLE PARTY:  Acct ID - Guarantor Home Phone Work Phone Relationship Acct Type  [id number] Arvin [phone]  Self P/F     [place], [place], [place] [address]     PLAN OF CARE and INTERVENTIONS:             1. GOALS OF CARE/ ADVANCE CARE PLANNING:  The goal is to keep patient at home. Patient is a DNR. 1.  2. SOCIAL/EMOTIONAL/SPIRITUAL ASSESSMENT/ INTERVENTIONS:  SW completed an initial visit with patient at her home.Her husband-Dr. Coppa  was present. SW reinforced access palliative role in patient's care and how to access support when needed. Patient was in a hospital bed, laying on her side. She was awake. Patient responded to simple yes/no questions. She did not appear to be in any pain or discomfort. Her husband reported that patient has advance dementia. She is no longer eating, but taking fluids that include Boost, orange juice, coca-cola and some soup. He advised that patient's condition has a steady decline since she had COVID. [name] wounds are healed. Patient continues to be restless and yell out with personal care. PCG is concerned about how patient's cathter needed to be changed and who will change the cathter. SW advised him that she will discuss with RN for follow-up.  2. PATIENT/CAREGIVER EDUCATION/ COPING:  SW provided education to Dr. Ulanda Edison regarding the palliative and hospice programs. Dr.Breeland has some medical knowledge, which has been a coping mechanism. He has a supportive family.  3. PERSONAL EMERGENCY PLAN:  911 can be activated for emergencies. 4. COMMUNITY RESOURCES COORDINATION/ HEALTH CARE NAVIGATION:  Patient has private caregivers throughout the week.  5. FINANCIAL/LEGAL CONCERNS/INTERVENTIONS:  None     SOCIAL HX:  Social History   Tobacco Use  . Smoking status: Never Smoker  . Smokeless  tobacco: Never Used  . Tobacco comment: smoked very little in college none since  Substance Use Topics  . Alcohol use: No    Alcohol/week: 0.0 standard drinks    CODE STATUS: DNR ADVANCED DIRECTIVES: Yes MOST FORM COMPLETE:  Yes HOSPICE EDUCATION PROVIDED: No  PPS: Patient is bedbound and dependent for all ADL's. Her verbalizations are minimal to 1/2 random words. Patient is only taking liquids. She has advanced dementia and is total care.  Duration visit and documentation 60 minutes      Katheren Puller, LCSW

## 2020-10-03 DIAGNOSIS — R339 Retention of urine, unspecified: Secondary | ICD-10-CM | POA: Diagnosis not present

## 2020-10-03 DIAGNOSIS — I10 Essential (primary) hypertension: Secondary | ICD-10-CM | POA: Diagnosis not present

## 2020-10-03 DIAGNOSIS — Z87891 Personal history of nicotine dependence: Secondary | ICD-10-CM | POA: Diagnosis not present

## 2020-10-03 DIAGNOSIS — Z8616 Personal history of COVID-19: Secondary | ICD-10-CM | POA: Diagnosis not present

## 2020-10-03 DIAGNOSIS — N951 Menopausal and female climacteric states: Secondary | ICD-10-CM | POA: Diagnosis not present

## 2020-10-03 DIAGNOSIS — G301 Alzheimer's disease with late onset: Secondary | ICD-10-CM | POA: Diagnosis not present

## 2020-10-03 DIAGNOSIS — Z87440 Personal history of urinary (tract) infections: Secondary | ICD-10-CM | POA: Diagnosis not present

## 2020-10-03 DIAGNOSIS — E7800 Pure hypercholesterolemia, unspecified: Secondary | ICD-10-CM | POA: Diagnosis not present

## 2020-10-03 DIAGNOSIS — R32 Unspecified urinary incontinence: Secondary | ICD-10-CM | POA: Diagnosis not present

## 2020-10-03 DIAGNOSIS — M109 Gout, unspecified: Secondary | ICD-10-CM | POA: Diagnosis not present

## 2020-10-03 DIAGNOSIS — F411 Generalized anxiety disorder: Secondary | ICD-10-CM | POA: Diagnosis not present

## 2020-10-03 DIAGNOSIS — G988 Other disorders of nervous system: Secondary | ICD-10-CM | POA: Diagnosis not present

## 2020-10-03 DIAGNOSIS — Z96651 Presence of right artificial knee joint: Secondary | ICD-10-CM | POA: Diagnosis not present

## 2020-10-03 DIAGNOSIS — Z7401 Bed confinement status: Secondary | ICD-10-CM | POA: Diagnosis not present

## 2020-10-03 DIAGNOSIS — M4800 Spinal stenosis, site unspecified: Secondary | ICD-10-CM | POA: Diagnosis not present

## 2020-10-03 DIAGNOSIS — E559 Vitamin D deficiency, unspecified: Secondary | ICD-10-CM | POA: Diagnosis not present

## 2020-10-03 DIAGNOSIS — Z9181 History of falling: Secondary | ICD-10-CM | POA: Diagnosis not present

## 2020-10-03 DIAGNOSIS — M810 Age-related osteoporosis without current pathological fracture: Secondary | ICD-10-CM | POA: Diagnosis not present

## 2020-10-03 DIAGNOSIS — Z466 Encounter for fitting and adjustment of urinary device: Secondary | ICD-10-CM | POA: Diagnosis not present

## 2020-10-03 DIAGNOSIS — F0280 Dementia in other diseases classified elsewhere without behavioral disturbance: Secondary | ICD-10-CM | POA: Diagnosis not present

## 2020-10-03 DIAGNOSIS — F0150 Vascular dementia without behavioral disturbance: Secondary | ICD-10-CM | POA: Diagnosis not present

## 2020-10-03 DIAGNOSIS — I69893 Ataxia following other cerebrovascular disease: Secondary | ICD-10-CM | POA: Diagnosis not present

## 2020-10-03 DIAGNOSIS — L409 Psoriasis, unspecified: Secondary | ICD-10-CM | POA: Diagnosis not present

## 2020-10-03 DIAGNOSIS — M1990 Unspecified osteoarthritis, unspecified site: Secondary | ICD-10-CM | POA: Diagnosis not present

## 2020-10-03 DIAGNOSIS — I69818 Other symptoms and signs involving cognitive functions following other cerebrovascular disease: Secondary | ICD-10-CM | POA: Diagnosis not present

## 2020-10-05 DIAGNOSIS — R32 Unspecified urinary incontinence: Secondary | ICD-10-CM | POA: Diagnosis not present

## 2020-10-05 DIAGNOSIS — F0280 Dementia in other diseases classified elsewhere without behavioral disturbance: Secondary | ICD-10-CM | POA: Diagnosis not present

## 2020-10-05 DIAGNOSIS — R339 Retention of urine, unspecified: Secondary | ICD-10-CM | POA: Diagnosis not present

## 2020-10-05 DIAGNOSIS — I69893 Ataxia following other cerebrovascular disease: Secondary | ICD-10-CM | POA: Diagnosis not present

## 2020-10-05 DIAGNOSIS — Z466 Encounter for fitting and adjustment of urinary device: Secondary | ICD-10-CM | POA: Diagnosis not present

## 2020-10-05 DIAGNOSIS — G301 Alzheimer's disease with late onset: Secondary | ICD-10-CM | POA: Diagnosis not present

## 2020-10-08 DIAGNOSIS — R32 Unspecified urinary incontinence: Secondary | ICD-10-CM | POA: Diagnosis not present

## 2020-10-08 DIAGNOSIS — Z466 Encounter for fitting and adjustment of urinary device: Secondary | ICD-10-CM | POA: Diagnosis not present

## 2020-10-08 DIAGNOSIS — R339 Retention of urine, unspecified: Secondary | ICD-10-CM | POA: Diagnosis not present

## 2020-10-08 DIAGNOSIS — G301 Alzheimer's disease with late onset: Secondary | ICD-10-CM | POA: Diagnosis not present

## 2020-10-08 DIAGNOSIS — F0280 Dementia in other diseases classified elsewhere without behavioral disturbance: Secondary | ICD-10-CM | POA: Diagnosis not present

## 2020-10-08 DIAGNOSIS — I69893 Ataxia following other cerebrovascular disease: Secondary | ICD-10-CM | POA: Diagnosis not present

## 2020-10-09 DIAGNOSIS — R32 Unspecified urinary incontinence: Secondary | ICD-10-CM | POA: Diagnosis not present

## 2020-10-09 DIAGNOSIS — I69893 Ataxia following other cerebrovascular disease: Secondary | ICD-10-CM | POA: Diagnosis not present

## 2020-10-09 DIAGNOSIS — R339 Retention of urine, unspecified: Secondary | ICD-10-CM | POA: Diagnosis not present

## 2020-10-09 DIAGNOSIS — G301 Alzheimer's disease with late onset: Secondary | ICD-10-CM | POA: Diagnosis not present

## 2020-10-09 DIAGNOSIS — Z466 Encounter for fitting and adjustment of urinary device: Secondary | ICD-10-CM | POA: Diagnosis not present

## 2020-10-09 DIAGNOSIS — F0280 Dementia in other diseases classified elsewhere without behavioral disturbance: Secondary | ICD-10-CM | POA: Diagnosis not present

## 2020-10-09 IMAGING — CT CT HEAD W/O CM
3 of 6 series · 16 of 47 positions shown, 19 images · non-contrast
Comparison: November 07, 2019

CLINICAL DATA: Increased confusion and lethargy

EXAM:
CT HEAD WITHOUT CONTRAST
TECHNIQUE: Contiguous axial images were obtained from the base of the skull
through the vertex without intravenous contrast.

[Series 2: head wo · axial · 0.47mm/px · z∈[+1387,+1517]mm · 11 of 32 slices shown, 14 images]
[im 3/32  brain]
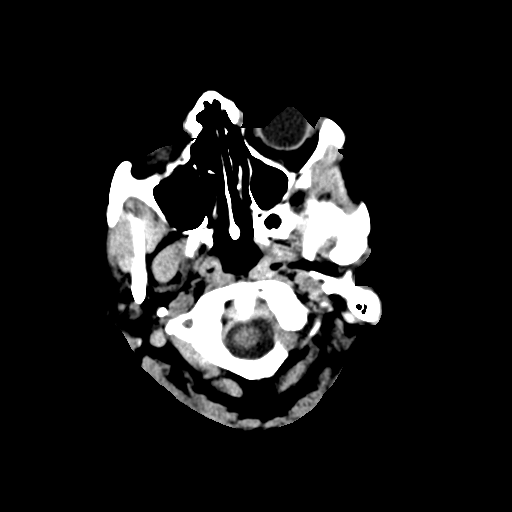
[im 3/32  bone]
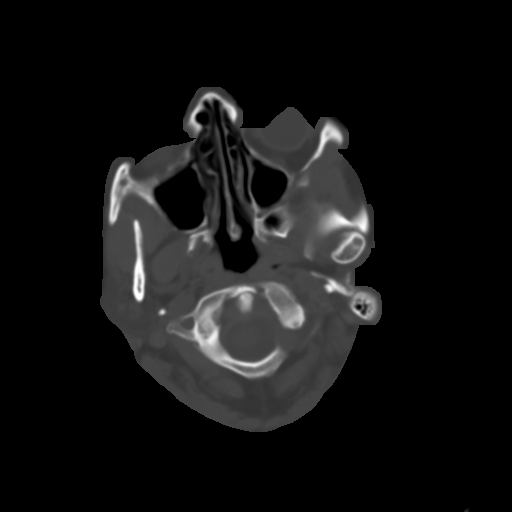
[im 5/32  brain]
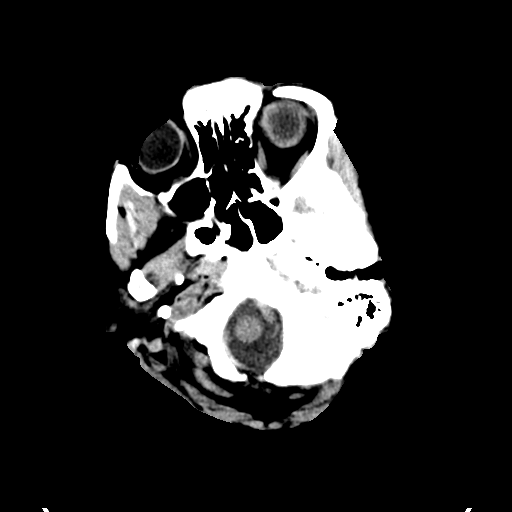
[im 7/32  brain]
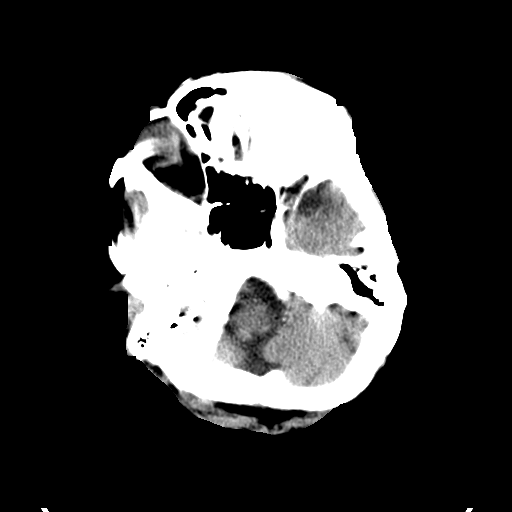
[im 12/32  brain]
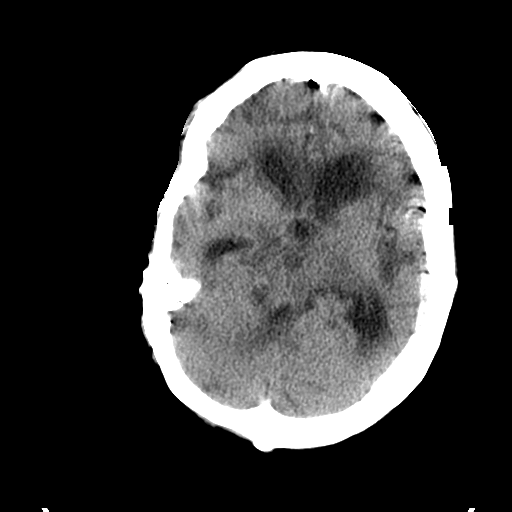
[im 14/32  brain]
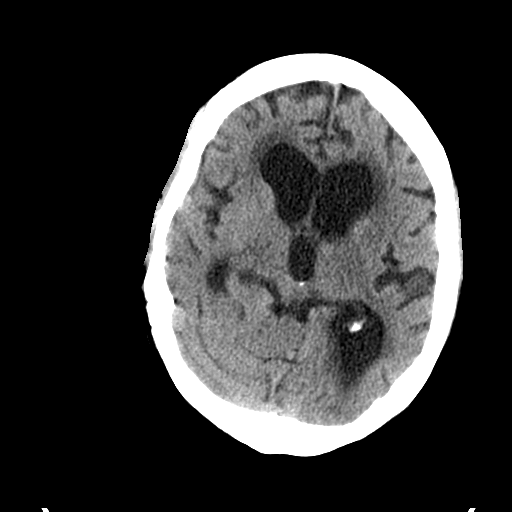
[im 14/32  bone]
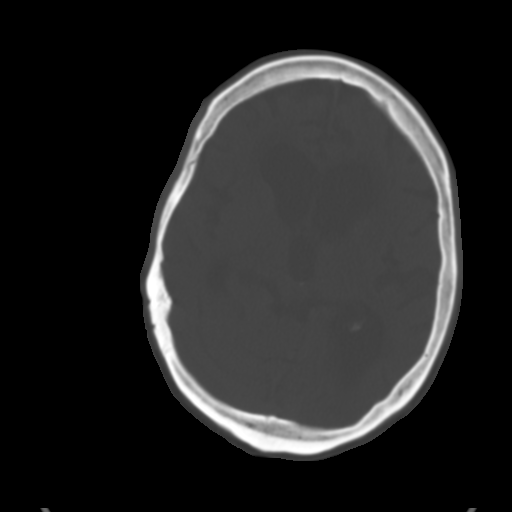
[im 16/32  brain]
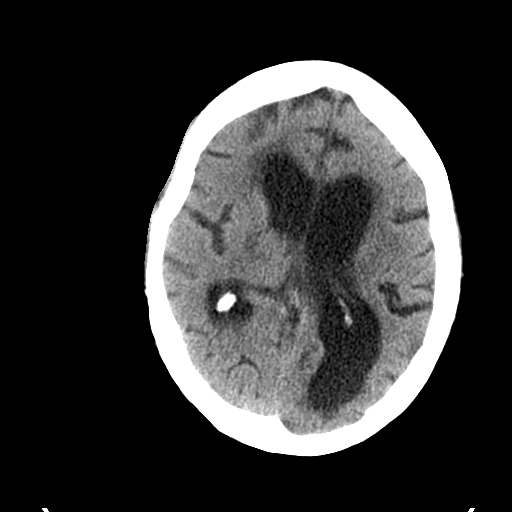
[im 18/32  brain]
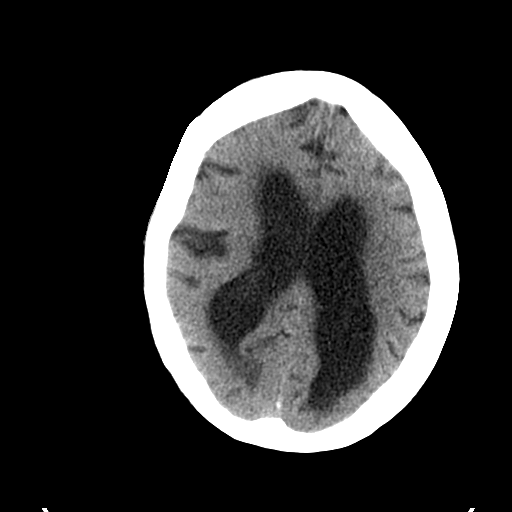
[im 20/32  brain]
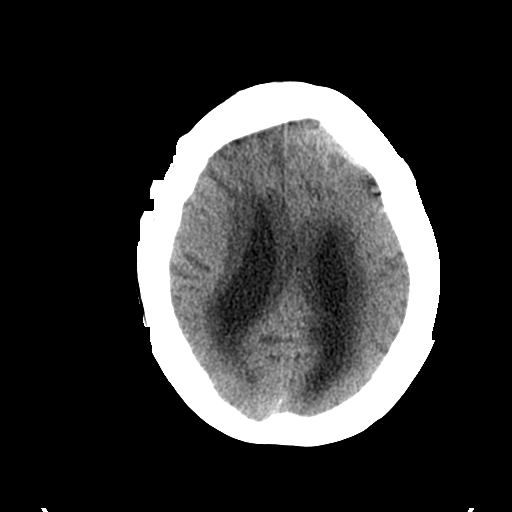
[im 25/32  brain]
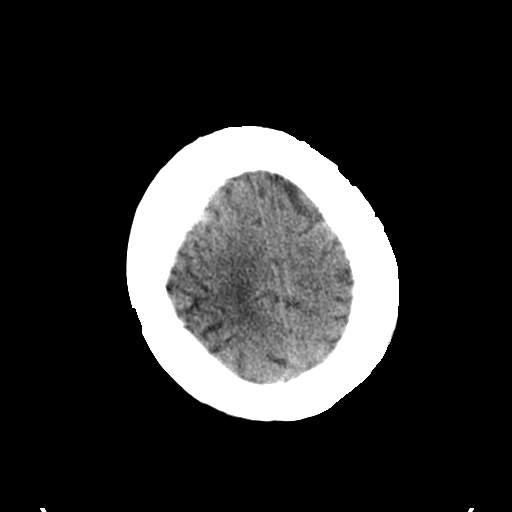
[im 25/32  bone]
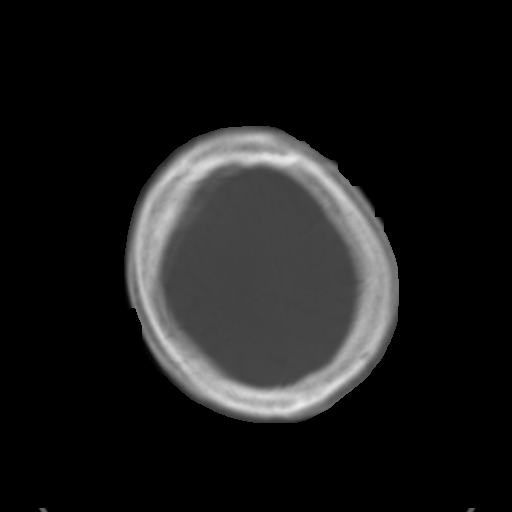
[im 27/32  brain]
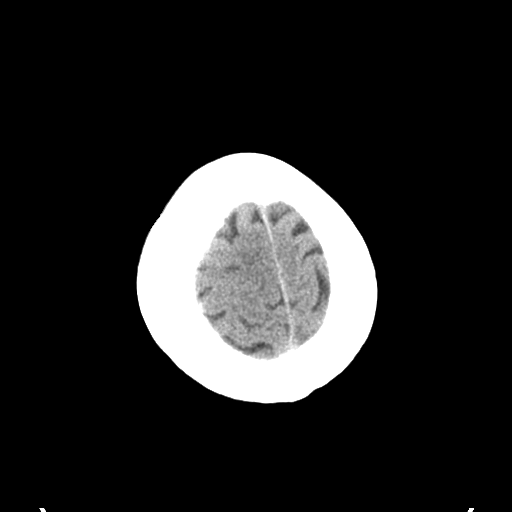
[im 29/32  brain]
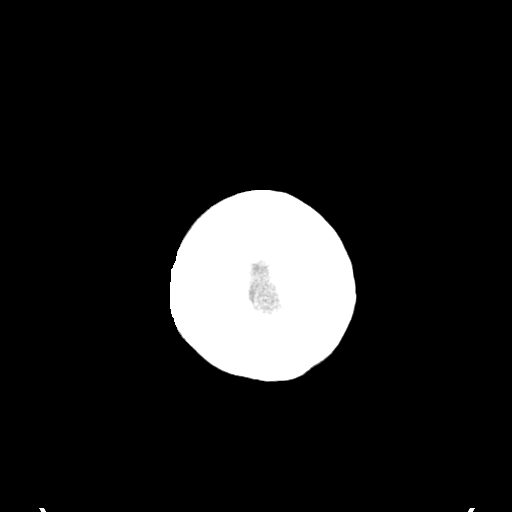

[Series 6: coronal soft tissue · coronal · 0.31mm/px · 3 of 65 slices shown]
[im 17/65  brain]
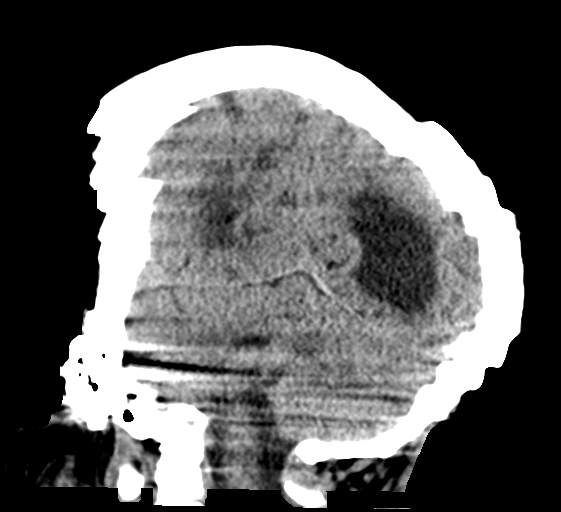
[im 33/65  brain]
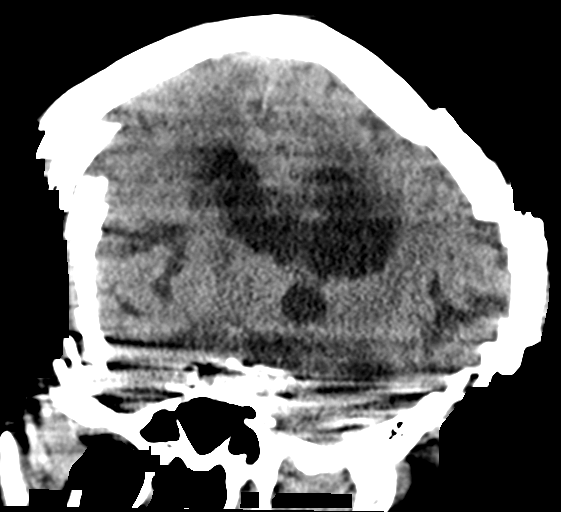
[im 49/65  brain]
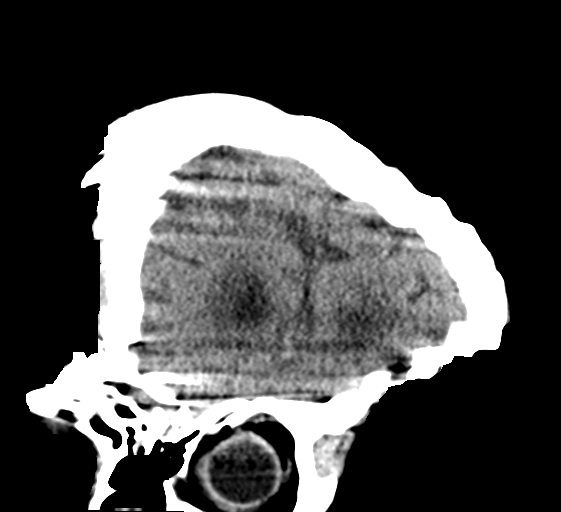

[Series 8: sagittal soft tissue · sagittal · 0.31mm/px · 2 of 54 slices shown]
[im 18/54  brain]
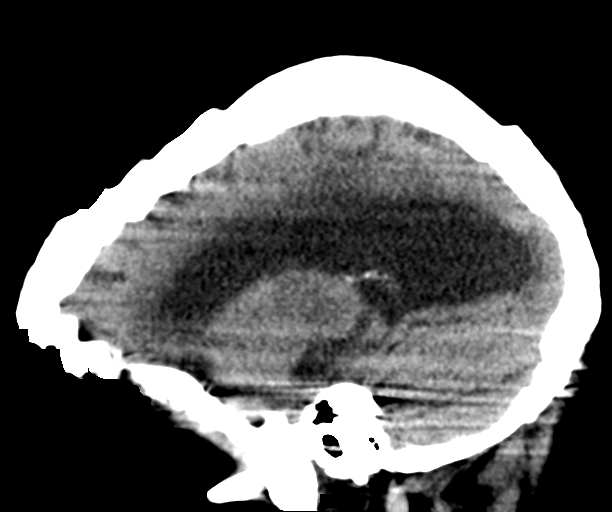
[im 36/54  brain]
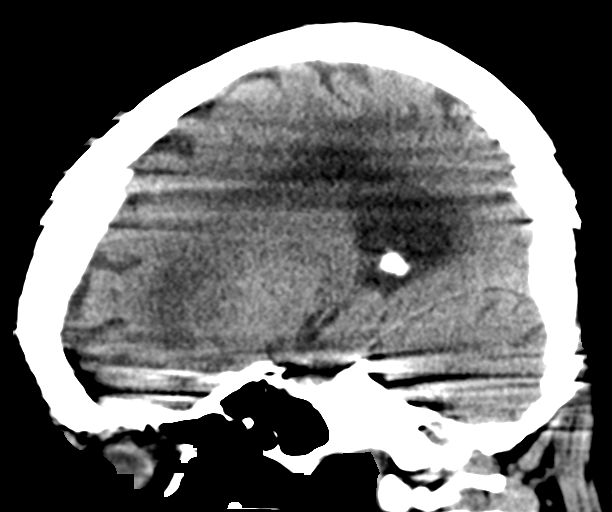

[16 of 47 positions shown; findings below may reference images not displayed]

FINDINGS: Brain: No evidence of acute territorial infarction, hemorrhage,
hydrocephalus,extra-axial collection or mass lesion/mass effect.
There is dilatation the ventricles and sulci consistent with
age-related atrophy. Low-attenuation changes in the deep white
matter consistent with small vessel ischemia.

Vascular: No hyperdense vessel or unexpected calcification.

Skull: The skull is intact. No fracture or focal lesion identified.

Sinuses/Orbits: The visualized paranasal sinuses and mastoid air
cells are clear. The orbits and globes intact.

Other: None
IMPRESSION: No acute intracranial abnormality. Somewhat limited due to patient
motion

Findings consistent with advanced age related atrophy and chronic
small vessel ischemia

## 2020-10-10 DIAGNOSIS — Z466 Encounter for fitting and adjustment of urinary device: Secondary | ICD-10-CM | POA: Diagnosis not present

## 2020-10-10 DIAGNOSIS — R32 Unspecified urinary incontinence: Secondary | ICD-10-CM | POA: Diagnosis not present

## 2020-10-10 DIAGNOSIS — R339 Retention of urine, unspecified: Secondary | ICD-10-CM | POA: Diagnosis not present

## 2020-10-10 DIAGNOSIS — F0280 Dementia in other diseases classified elsewhere without behavioral disturbance: Secondary | ICD-10-CM | POA: Diagnosis not present

## 2020-10-10 DIAGNOSIS — G301 Alzheimer's disease with late onset: Secondary | ICD-10-CM | POA: Diagnosis not present

## 2020-10-10 DIAGNOSIS — I69893 Ataxia following other cerebrovascular disease: Secondary | ICD-10-CM | POA: Diagnosis not present

## 2020-10-14 DIAGNOSIS — R339 Retention of urine, unspecified: Secondary | ICD-10-CM | POA: Diagnosis not present

## 2020-10-14 DIAGNOSIS — R32 Unspecified urinary incontinence: Secondary | ICD-10-CM | POA: Diagnosis not present

## 2020-10-14 DIAGNOSIS — G301 Alzheimer's disease with late onset: Secondary | ICD-10-CM | POA: Diagnosis not present

## 2020-10-14 DIAGNOSIS — I69893 Ataxia following other cerebrovascular disease: Secondary | ICD-10-CM | POA: Diagnosis not present

## 2020-10-14 DIAGNOSIS — F0280 Dementia in other diseases classified elsewhere without behavioral disturbance: Secondary | ICD-10-CM | POA: Diagnosis not present

## 2020-10-14 DIAGNOSIS — Z466 Encounter for fitting and adjustment of urinary device: Secondary | ICD-10-CM | POA: Diagnosis not present

## 2020-10-15 DIAGNOSIS — G301 Alzheimer's disease with late onset: Secondary | ICD-10-CM | POA: Diagnosis not present

## 2020-10-15 DIAGNOSIS — I69893 Ataxia following other cerebrovascular disease: Secondary | ICD-10-CM | POA: Diagnosis not present

## 2020-10-15 DIAGNOSIS — Z466 Encounter for fitting and adjustment of urinary device: Secondary | ICD-10-CM | POA: Diagnosis not present

## 2020-10-15 DIAGNOSIS — F0280 Dementia in other diseases classified elsewhere without behavioral disturbance: Secondary | ICD-10-CM | POA: Diagnosis not present

## 2020-10-15 DIAGNOSIS — R32 Unspecified urinary incontinence: Secondary | ICD-10-CM | POA: Diagnosis not present

## 2020-10-15 DIAGNOSIS — R339 Retention of urine, unspecified: Secondary | ICD-10-CM | POA: Diagnosis not present

## 2020-10-21 DIAGNOSIS — R339 Retention of urine, unspecified: Secondary | ICD-10-CM | POA: Diagnosis not present

## 2020-10-21 DIAGNOSIS — F0280 Dementia in other diseases classified elsewhere without behavioral disturbance: Secondary | ICD-10-CM | POA: Diagnosis not present

## 2020-10-21 DIAGNOSIS — R32 Unspecified urinary incontinence: Secondary | ICD-10-CM | POA: Diagnosis not present

## 2020-10-21 DIAGNOSIS — Z466 Encounter for fitting and adjustment of urinary device: Secondary | ICD-10-CM | POA: Diagnosis not present

## 2020-10-21 DIAGNOSIS — G301 Alzheimer's disease with late onset: Secondary | ICD-10-CM | POA: Diagnosis not present

## 2020-10-21 DIAGNOSIS — I69893 Ataxia following other cerebrovascular disease: Secondary | ICD-10-CM | POA: Diagnosis not present

## 2020-10-23 DIAGNOSIS — Z466 Encounter for fitting and adjustment of urinary device: Secondary | ICD-10-CM | POA: Diagnosis not present

## 2020-10-23 DIAGNOSIS — R32 Unspecified urinary incontinence: Secondary | ICD-10-CM | POA: Diagnosis not present

## 2020-10-23 DIAGNOSIS — I69893 Ataxia following other cerebrovascular disease: Secondary | ICD-10-CM | POA: Diagnosis not present

## 2020-10-23 DIAGNOSIS — F0280 Dementia in other diseases classified elsewhere without behavioral disturbance: Secondary | ICD-10-CM | POA: Diagnosis not present

## 2020-10-23 DIAGNOSIS — R339 Retention of urine, unspecified: Secondary | ICD-10-CM | POA: Diagnosis not present

## 2020-10-23 DIAGNOSIS — G301 Alzheimer's disease with late onset: Secondary | ICD-10-CM | POA: Diagnosis not present

## 2020-10-24 DIAGNOSIS — F0280 Dementia in other diseases classified elsewhere without behavioral disturbance: Secondary | ICD-10-CM | POA: Diagnosis not present

## 2020-10-24 DIAGNOSIS — I69893 Ataxia following other cerebrovascular disease: Secondary | ICD-10-CM | POA: Diagnosis not present

## 2020-10-24 DIAGNOSIS — G301 Alzheimer's disease with late onset: Secondary | ICD-10-CM | POA: Diagnosis not present

## 2020-10-24 DIAGNOSIS — R339 Retention of urine, unspecified: Secondary | ICD-10-CM | POA: Diagnosis not present

## 2020-10-24 DIAGNOSIS — Z466 Encounter for fitting and adjustment of urinary device: Secondary | ICD-10-CM | POA: Diagnosis not present

## 2020-10-24 DIAGNOSIS — R32 Unspecified urinary incontinence: Secondary | ICD-10-CM | POA: Diagnosis not present

## 2020-10-29 DIAGNOSIS — R339 Retention of urine, unspecified: Secondary | ICD-10-CM | POA: Diagnosis not present

## 2020-10-29 DIAGNOSIS — F0280 Dementia in other diseases classified elsewhere without behavioral disturbance: Secondary | ICD-10-CM | POA: Diagnosis not present

## 2020-10-29 DIAGNOSIS — R32 Unspecified urinary incontinence: Secondary | ICD-10-CM | POA: Diagnosis not present

## 2020-10-29 DIAGNOSIS — I69893 Ataxia following other cerebrovascular disease: Secondary | ICD-10-CM | POA: Diagnosis not present

## 2020-10-29 DIAGNOSIS — Z466 Encounter for fitting and adjustment of urinary device: Secondary | ICD-10-CM | POA: Diagnosis not present

## 2020-10-29 DIAGNOSIS — G301 Alzheimer's disease with late onset: Secondary | ICD-10-CM | POA: Diagnosis not present

## 2020-10-30 DIAGNOSIS — R339 Retention of urine, unspecified: Secondary | ICD-10-CM | POA: Diagnosis not present

## 2020-10-30 DIAGNOSIS — G301 Alzheimer's disease with late onset: Secondary | ICD-10-CM | POA: Diagnosis not present

## 2020-10-30 DIAGNOSIS — F0280 Dementia in other diseases classified elsewhere without behavioral disturbance: Secondary | ICD-10-CM | POA: Diagnosis not present

## 2020-10-30 DIAGNOSIS — I69893 Ataxia following other cerebrovascular disease: Secondary | ICD-10-CM | POA: Diagnosis not present

## 2020-10-30 DIAGNOSIS — Z466 Encounter for fitting and adjustment of urinary device: Secondary | ICD-10-CM | POA: Diagnosis not present

## 2020-10-30 DIAGNOSIS — R32 Unspecified urinary incontinence: Secondary | ICD-10-CM | POA: Diagnosis not present

## 2020-11-02 DIAGNOSIS — Z9181 History of falling: Secondary | ICD-10-CM | POA: Diagnosis not present

## 2020-11-02 DIAGNOSIS — Z87891 Personal history of nicotine dependence: Secondary | ICD-10-CM | POA: Diagnosis not present

## 2020-11-02 DIAGNOSIS — L409 Psoriasis, unspecified: Secondary | ICD-10-CM | POA: Diagnosis not present

## 2020-11-02 DIAGNOSIS — F0150 Vascular dementia without behavioral disturbance: Secondary | ICD-10-CM | POA: Diagnosis not present

## 2020-11-02 DIAGNOSIS — M4800 Spinal stenosis, site unspecified: Secondary | ICD-10-CM | POA: Diagnosis not present

## 2020-11-02 DIAGNOSIS — E7800 Pure hypercholesterolemia, unspecified: Secondary | ICD-10-CM | POA: Diagnosis not present

## 2020-11-02 DIAGNOSIS — Z96651 Presence of right artificial knee joint: Secondary | ICD-10-CM | POA: Diagnosis not present

## 2020-11-02 DIAGNOSIS — G988 Other disorders of nervous system: Secondary | ICD-10-CM | POA: Diagnosis not present

## 2020-11-02 DIAGNOSIS — R339 Retention of urine, unspecified: Secondary | ICD-10-CM | POA: Diagnosis not present

## 2020-11-02 DIAGNOSIS — M1990 Unspecified osteoarthritis, unspecified site: Secondary | ICD-10-CM | POA: Diagnosis not present

## 2020-11-02 DIAGNOSIS — N951 Menopausal and female climacteric states: Secondary | ICD-10-CM | POA: Diagnosis not present

## 2020-11-02 DIAGNOSIS — Z7401 Bed confinement status: Secondary | ICD-10-CM | POA: Diagnosis not present

## 2020-11-02 DIAGNOSIS — Z466 Encounter for fitting and adjustment of urinary device: Secondary | ICD-10-CM | POA: Diagnosis not present

## 2020-11-02 DIAGNOSIS — I69893 Ataxia following other cerebrovascular disease: Secondary | ICD-10-CM | POA: Diagnosis not present

## 2020-11-02 DIAGNOSIS — F411 Generalized anxiety disorder: Secondary | ICD-10-CM | POA: Diagnosis not present

## 2020-11-02 DIAGNOSIS — F0280 Dementia in other diseases classified elsewhere without behavioral disturbance: Secondary | ICD-10-CM | POA: Diagnosis not present

## 2020-11-02 DIAGNOSIS — G301 Alzheimer's disease with late onset: Secondary | ICD-10-CM | POA: Diagnosis not present

## 2020-11-02 DIAGNOSIS — E559 Vitamin D deficiency, unspecified: Secondary | ICD-10-CM | POA: Diagnosis not present

## 2020-11-02 DIAGNOSIS — I69818 Other symptoms and signs involving cognitive functions following other cerebrovascular disease: Secondary | ICD-10-CM | POA: Diagnosis not present

## 2020-11-02 DIAGNOSIS — R32 Unspecified urinary incontinence: Secondary | ICD-10-CM | POA: Diagnosis not present

## 2020-11-02 DIAGNOSIS — M810 Age-related osteoporosis without current pathological fracture: Secondary | ICD-10-CM | POA: Diagnosis not present

## 2020-11-02 DIAGNOSIS — I10 Essential (primary) hypertension: Secondary | ICD-10-CM | POA: Diagnosis not present

## 2020-11-02 DIAGNOSIS — Z87440 Personal history of urinary (tract) infections: Secondary | ICD-10-CM | POA: Diagnosis not present

## 2020-11-02 DIAGNOSIS — Z8616 Personal history of COVID-19: Secondary | ICD-10-CM | POA: Diagnosis not present

## 2020-11-02 DIAGNOSIS — M109 Gout, unspecified: Secondary | ICD-10-CM | POA: Diagnosis not present

## 2020-11-05 ENCOUNTER — Other Ambulatory Visit: Payer: Self-pay

## 2020-11-05 ENCOUNTER — Ambulatory Visit: Payer: Medicare Other | Attending: Internal Medicine

## 2020-11-05 DIAGNOSIS — Z23 Encounter for immunization: Secondary | ICD-10-CM

## 2020-11-05 NOTE — Progress Notes (Signed)
   Covid-19 Vaccination Clinic  Name:  Tammy Arnold    MRN: [id number] DOB: [birth year]  11/05/2020  [name] was observed post Covid-19 immunization for 15 minutes without incident. She was provided with Vaccine Information Sheet and instruction to access the V-Safe system.   [name] was instructed to call 911 with any severe reactions post vaccine: Marland Kitchen Difficulty breathing  . Swelling of face and throat  . A fast heartbeat  . A bad rash all over body  . Dizziness and weakness   Immunizations Administered    Name Date Dose VIS Date Route   Moderna Covid-19 Booster Vaccine 11/05/2020 12:04 PM 0.25 mL 08/13/2020 Intramuscular   Manufacturer: Moderna   Lot: [id number]   [place]: [id number]

## 2020-11-11 ENCOUNTER — Other Ambulatory Visit: Payer: Medicare Other

## 2020-11-11 ENCOUNTER — Other Ambulatory Visit: Payer: Self-pay

## 2020-11-11 DIAGNOSIS — Z515 Encounter for palliative care: Secondary | ICD-10-CM

## 2020-11-11 NOTE — Progress Notes (Signed)
COMMUNITY PALLIATIVE CARE SW NOTE  PATIENT NAME: Tammy Arnold DOB: 1941/07/13 MRN: [id number]  PRIMARY CARE PROVIDER: Kelton Pillar, MD  RESPONSIBLE PARTY:  Acct ID - Guarantor Home Phone Work Phone Relationship Acct Type  [id number] Arvin Collard248 [phone]  Self P/F     [place], [place], [place] [address]   Due to the COVID-19, this visit was done via telephone from my office and it was initiated and consent by this patient and/or family.    PLAN OF CARE and INTERVENTIONS:             1. GOALS OF CARE/ ADVANCE CARE PLANNING:  The goal is to keep patient at home. Patient is a DNR. 1.  2. SOCIAL/EMOTIONAL/SPIRITUAL ASSESSMENT/ INTERVENTIONS:  SW completed a telephonic visit with patient's husband-Dr. Ulanda Edison to assess patient's comfort, needs and provide support to him. Dr. Ulanda Edison reported that patient is doing well. She continues to stablely decline.He report that her catheter is draining well and it is being flushed 2x/ day. Patient has had a PT, OT and [facility] assessment [facility], but Dr. Ulanda Edison states that he has not received any feedback from those assessment and may reach out to the agency later this week. Patient is on  A fluid diet that consists of Boost, orange juice, coca-cola and soup. Patient continues to yell out during personal care. She is dependent for personal care needs. Patient has private paid caregivers. Dr. Ulanda Edison expressed no other concerns. 3. PATIENT/CAREGIVER EDUCATION/ COPING:  Dr. Ulanda Edison appears to be coping well.  4. PERSONAL EMERGENCY PLAN:  911 can be activated for any emergencies. It is Dr. Marijean Heath desire that patient not return to the hospital.  5. COMMUNITY RESOURCES COORDINATION/ HEALTH CARE NAVIGATION:  Patient has private caregivers. Patient had PT, OT, ST assessment-treatment has not been determined. 6. FINANCIAL/LEGAL CONCERNS/INTERVENTIONS:  None     SOCIAL HX:  Social History   Tobacco Use  . Smoking status: Never Smoker  .  Smokeless tobacco: Never Used  . Tobacco comment: smoked very little in college none since  Substance Use Topics  . Alcohol use: No    Alcohol/week: 0.0 standard drinks    CODE STATUS: DNR ADVANCED DIRECTIVES: Yes MOST FORM COMPLETE:  No HOSPICE EDUCATION PROVIDED: No  PPS: Patient is bedbound and dependent for all ADL's. Her verbalizations are minimal to 1/2 random words. Patient is only taking liquids. She has advanced dementia and is total care.  Duration telephonic visit and documentation 45 minutes      Katheren Puller, LCSW

## 2020-11-13 DIAGNOSIS — G301 Alzheimer's disease with late onset: Secondary | ICD-10-CM | POA: Diagnosis not present

## 2020-11-13 DIAGNOSIS — F0280 Dementia in other diseases classified elsewhere without behavioral disturbance: Secondary | ICD-10-CM | POA: Diagnosis not present

## 2020-11-13 DIAGNOSIS — R32 Unspecified urinary incontinence: Secondary | ICD-10-CM | POA: Diagnosis not present

## 2020-11-13 DIAGNOSIS — Z466 Encounter for fitting and adjustment of urinary device: Secondary | ICD-10-CM | POA: Diagnosis not present

## 2020-11-13 DIAGNOSIS — I69893 Ataxia following other cerebrovascular disease: Secondary | ICD-10-CM | POA: Diagnosis not present

## 2020-11-13 DIAGNOSIS — R339 Retention of urine, unspecified: Secondary | ICD-10-CM | POA: Diagnosis not present

## 2020-11-18 DIAGNOSIS — Z466 Encounter for fitting and adjustment of urinary device: Secondary | ICD-10-CM | POA: Diagnosis not present

## 2020-11-18 DIAGNOSIS — F0280 Dementia in other diseases classified elsewhere without behavioral disturbance: Secondary | ICD-10-CM | POA: Diagnosis not present

## 2020-11-18 DIAGNOSIS — R32 Unspecified urinary incontinence: Secondary | ICD-10-CM | POA: Diagnosis not present

## 2020-11-18 DIAGNOSIS — I69893 Ataxia following other cerebrovascular disease: Secondary | ICD-10-CM | POA: Diagnosis not present

## 2020-11-18 DIAGNOSIS — R339 Retention of urine, unspecified: Secondary | ICD-10-CM | POA: Diagnosis not present

## 2020-11-18 DIAGNOSIS — G301 Alzheimer's disease with late onset: Secondary | ICD-10-CM | POA: Diagnosis not present

## 2020-11-20 DIAGNOSIS — R32 Unspecified urinary incontinence: Secondary | ICD-10-CM | POA: Diagnosis not present

## 2020-11-20 DIAGNOSIS — R339 Retention of urine, unspecified: Secondary | ICD-10-CM | POA: Diagnosis not present

## 2020-11-20 DIAGNOSIS — F0280 Dementia in other diseases classified elsewhere without behavioral disturbance: Secondary | ICD-10-CM | POA: Diagnosis not present

## 2020-11-20 DIAGNOSIS — Z466 Encounter for fitting and adjustment of urinary device: Secondary | ICD-10-CM | POA: Diagnosis not present

## 2020-11-20 DIAGNOSIS — G301 Alzheimer's disease with late onset: Secondary | ICD-10-CM | POA: Diagnosis not present

## 2020-11-20 DIAGNOSIS — I69893 Ataxia following other cerebrovascular disease: Secondary | ICD-10-CM | POA: Diagnosis not present

## 2020-11-25 DIAGNOSIS — R32 Unspecified urinary incontinence: Secondary | ICD-10-CM | POA: Diagnosis not present

## 2020-11-25 DIAGNOSIS — I69893 Ataxia following other cerebrovascular disease: Secondary | ICD-10-CM | POA: Diagnosis not present

## 2020-11-25 DIAGNOSIS — R339 Retention of urine, unspecified: Secondary | ICD-10-CM | POA: Diagnosis not present

## 2020-11-25 DIAGNOSIS — G301 Alzheimer's disease with late onset: Secondary | ICD-10-CM | POA: Diagnosis not present

## 2020-11-25 DIAGNOSIS — F0280 Dementia in other diseases classified elsewhere without behavioral disturbance: Secondary | ICD-10-CM | POA: Diagnosis not present

## 2020-11-25 DIAGNOSIS — Z466 Encounter for fitting and adjustment of urinary device: Secondary | ICD-10-CM | POA: Diagnosis not present

## 2020-11-28 DIAGNOSIS — R32 Unspecified urinary incontinence: Secondary | ICD-10-CM | POA: Diagnosis not present

## 2020-11-28 DIAGNOSIS — G301 Alzheimer's disease with late onset: Secondary | ICD-10-CM | POA: Diagnosis not present

## 2020-11-28 DIAGNOSIS — R339 Retention of urine, unspecified: Secondary | ICD-10-CM | POA: Diagnosis not present

## 2020-11-28 DIAGNOSIS — Z466 Encounter for fitting and adjustment of urinary device: Secondary | ICD-10-CM | POA: Diagnosis not present

## 2020-11-28 DIAGNOSIS — F0280 Dementia in other diseases classified elsewhere without behavioral disturbance: Secondary | ICD-10-CM | POA: Diagnosis not present

## 2020-11-28 DIAGNOSIS — I69893 Ataxia following other cerebrovascular disease: Secondary | ICD-10-CM | POA: Diagnosis not present

## 2020-12-02 DIAGNOSIS — Z7401 Bed confinement status: Secondary | ICD-10-CM | POA: Diagnosis not present

## 2020-12-02 DIAGNOSIS — G301 Alzheimer's disease with late onset: Secondary | ICD-10-CM | POA: Diagnosis not present

## 2020-12-02 DIAGNOSIS — R32 Unspecified urinary incontinence: Secondary | ICD-10-CM | POA: Diagnosis not present

## 2020-12-02 DIAGNOSIS — M1990 Unspecified osteoarthritis, unspecified site: Secondary | ICD-10-CM | POA: Diagnosis not present

## 2020-12-02 DIAGNOSIS — Z87440 Personal history of urinary (tract) infections: Secondary | ICD-10-CM | POA: Diagnosis not present

## 2020-12-02 DIAGNOSIS — Z87891 Personal history of nicotine dependence: Secondary | ICD-10-CM | POA: Diagnosis not present

## 2020-12-02 DIAGNOSIS — Z8616 Personal history of COVID-19: Secondary | ICD-10-CM | POA: Diagnosis not present

## 2020-12-02 DIAGNOSIS — Z466 Encounter for fitting and adjustment of urinary device: Secondary | ICD-10-CM | POA: Diagnosis not present

## 2020-12-02 DIAGNOSIS — M810 Age-related osteoporosis without current pathological fracture: Secondary | ICD-10-CM | POA: Diagnosis not present

## 2020-12-02 DIAGNOSIS — F411 Generalized anxiety disorder: Secondary | ICD-10-CM | POA: Diagnosis not present

## 2020-12-02 DIAGNOSIS — I10 Essential (primary) hypertension: Secondary | ICD-10-CM | POA: Diagnosis not present

## 2020-12-02 DIAGNOSIS — M4800 Spinal stenosis, site unspecified: Secondary | ICD-10-CM | POA: Diagnosis not present

## 2020-12-02 DIAGNOSIS — Z9181 History of falling: Secondary | ICD-10-CM | POA: Diagnosis not present

## 2020-12-02 DIAGNOSIS — F0150 Vascular dementia without behavioral disturbance: Secondary | ICD-10-CM | POA: Diagnosis not present

## 2020-12-02 DIAGNOSIS — I69318 Other symptoms and signs involving cognitive functions following cerebral infarction: Secondary | ICD-10-CM | POA: Diagnosis not present

## 2020-12-02 DIAGNOSIS — E7800 Pure hypercholesterolemia, unspecified: Secondary | ICD-10-CM | POA: Diagnosis not present

## 2020-12-02 DIAGNOSIS — I69393 Ataxia following cerebral infarction: Secondary | ICD-10-CM | POA: Diagnosis not present

## 2020-12-02 DIAGNOSIS — R339 Retention of urine, unspecified: Secondary | ICD-10-CM | POA: Diagnosis not present

## 2020-12-02 DIAGNOSIS — M109 Gout, unspecified: Secondary | ICD-10-CM | POA: Diagnosis not present

## 2020-12-02 DIAGNOSIS — F0280 Dementia in other diseases classified elsewhere without behavioral disturbance: Secondary | ICD-10-CM | POA: Diagnosis not present

## 2020-12-02 DIAGNOSIS — Z79899 Other long term (current) drug therapy: Secondary | ICD-10-CM | POA: Diagnosis not present

## 2020-12-02 DIAGNOSIS — Z96651 Presence of right artificial knee joint: Secondary | ICD-10-CM | POA: Diagnosis not present

## 2020-12-02 DIAGNOSIS — L409 Psoriasis, unspecified: Secondary | ICD-10-CM | POA: Diagnosis not present

## 2020-12-06 DIAGNOSIS — G301 Alzheimer's disease with late onset: Secondary | ICD-10-CM | POA: Diagnosis not present

## 2020-12-06 DIAGNOSIS — I10 Essential (primary) hypertension: Secondary | ICD-10-CM | POA: Diagnosis not present

## 2020-12-06 DIAGNOSIS — R32 Unspecified urinary incontinence: Secondary | ICD-10-CM | POA: Diagnosis not present

## 2020-12-06 DIAGNOSIS — R339 Retention of urine, unspecified: Secondary | ICD-10-CM | POA: Diagnosis not present

## 2020-12-06 DIAGNOSIS — Z466 Encounter for fitting and adjustment of urinary device: Secondary | ICD-10-CM | POA: Diagnosis not present

## 2020-12-06 DIAGNOSIS — F0280 Dementia in other diseases classified elsewhere without behavioral disturbance: Secondary | ICD-10-CM | POA: Diagnosis not present

## 2020-12-18 ENCOUNTER — Other Ambulatory Visit: Payer: Self-pay

## 2020-12-18 ENCOUNTER — Other Ambulatory Visit: Payer: Medicare Other

## 2020-12-18 DIAGNOSIS — Z515 Encounter for palliative care: Secondary | ICD-10-CM

## 2020-12-18 NOTE — Progress Notes (Signed)
COMMUNITY PALLIATIVE CARE SW NOTE  PATIENT NAME: Tammy Arnold DOB: [birth year] MRN: [id number]  PRIMARY CARE PROVIDER: Kelton Pillar, MD  RESPONSIBLE PARTY:  Acct ID - Guarantor Home Phone Work Phone Relationship Acct Type  [id number] Arvin [phone]  Self P/F     [place], [place], [place] [address]   Due to the COVID-19, this visit was done via telephone from my office and it was initiated and consent by this patient and/or family  PLAN OF CARE and INTERVENTIONS:             1. GOALS OF CARE/ ADVANCE CARE PLANNING:  The goal is to keep patient at home. Patient is a DNR. 1. SOCIAL/EMOTIONAL/SPIRITUAL ASSESSMENT/ INTERVENTIONS:  SW completed a telephonic visit with patient's husband-Dr. Ulanda Edison to assess patient's comfort, needs and provide support to him. Dr. Ulanda Edison reported that there has been not change in patient's status. He report that her catheter is draining well and it is being flushed 2x/ day. Patient was not approved for any PT,OT or SP services. [facility] continues to manage her catheter and that is going well according to PCG.  Patient is remains on a fluid diet that consists of Boost, orange juice, coca-cola and soup. Patient continues to yell out during personal care. She is dependent for personal care needs-she is total care. Patient has private paid caregivers. Dr. Ulanda Edison expressed no other concerns. SW provided supportive presence, assessment of needs and comfort of patient, coping of PCG, active listening and reassurance of support.  1. PATIENT/CAREGIVER EDUCATION/ COPING:  PCG report that he is coping well.  2. PERSONAL EMERGENCY PLAN:  911 can be activated for emergencies. 3. COMMUNITY RESOURCES COORDINATION/ HEALTH CARE NAVIGATION:  Patient catheter is being managed by [facility]. 4. FINANCIAL/LEGAL CONCERNS/INTERVENTIONS:  None,     SOCIAL HX:  Social History   Tobacco Use  . Smoking status: Never Smoker  . Smokeless tobacco: Never Used  . Tobacco  comment: smoked very little in college none since  Substance Use Topics  . Alcohol use: No    Alcohol/week: 0.0 standard drinks    CODE STATUS: DNR ADVANCED DIRECTIVES: Yes MOST FORM COMPLETE:  No HOSPICE EDUCATION PROVIDED: No  [id number] is bedbound and dependent for all ADL's. Her verbalizations are minimal to 1/2randomwords. Patient is only taking liquids. She has advanced dementia and is total care.  Duration telephonic visit and documentation30 minutes       Katheren Puller, LCSW

## 2020-12-26 DIAGNOSIS — R32 Unspecified urinary incontinence: Secondary | ICD-10-CM | POA: Diagnosis not present

## 2020-12-26 DIAGNOSIS — F0280 Dementia in other diseases classified elsewhere without behavioral disturbance: Secondary | ICD-10-CM | POA: Diagnosis not present

## 2020-12-26 DIAGNOSIS — Z466 Encounter for fitting and adjustment of urinary device: Secondary | ICD-10-CM | POA: Diagnosis not present

## 2020-12-26 DIAGNOSIS — G301 Alzheimer's disease with late onset: Secondary | ICD-10-CM | POA: Diagnosis not present

## 2020-12-26 DIAGNOSIS — R339 Retention of urine, unspecified: Secondary | ICD-10-CM | POA: Diagnosis not present

## 2020-12-26 DIAGNOSIS — I10 Essential (primary) hypertension: Secondary | ICD-10-CM | POA: Diagnosis not present

## 2021-01-01 DIAGNOSIS — Z79899 Other long term (current) drug therapy: Secondary | ICD-10-CM | POA: Diagnosis not present

## 2021-01-01 DIAGNOSIS — F411 Generalized anxiety disorder: Secondary | ICD-10-CM | POA: Diagnosis not present

## 2021-01-01 DIAGNOSIS — L409 Psoriasis, unspecified: Secondary | ICD-10-CM | POA: Diagnosis not present

## 2021-01-01 DIAGNOSIS — R32 Unspecified urinary incontinence: Secondary | ICD-10-CM | POA: Diagnosis not present

## 2021-01-01 DIAGNOSIS — Z9181 History of falling: Secondary | ICD-10-CM | POA: Diagnosis not present

## 2021-01-01 DIAGNOSIS — I69393 Ataxia following cerebral infarction: Secondary | ICD-10-CM | POA: Diagnosis not present

## 2021-01-01 DIAGNOSIS — Z96651 Presence of right artificial knee joint: Secondary | ICD-10-CM | POA: Diagnosis not present

## 2021-01-01 DIAGNOSIS — G301 Alzheimer's disease with late onset: Secondary | ICD-10-CM | POA: Diagnosis not present

## 2021-01-01 DIAGNOSIS — I69318 Other symptoms and signs involving cognitive functions following cerebral infarction: Secondary | ICD-10-CM | POA: Diagnosis not present

## 2021-01-01 DIAGNOSIS — Z87440 Personal history of urinary (tract) infections: Secondary | ICD-10-CM | POA: Diagnosis not present

## 2021-01-01 DIAGNOSIS — Z87891 Personal history of nicotine dependence: Secondary | ICD-10-CM | POA: Diagnosis not present

## 2021-01-01 DIAGNOSIS — F0150 Vascular dementia without behavioral disturbance: Secondary | ICD-10-CM | POA: Diagnosis not present

## 2021-01-01 DIAGNOSIS — M4800 Spinal stenosis, site unspecified: Secondary | ICD-10-CM | POA: Diagnosis not present

## 2021-01-01 DIAGNOSIS — I10 Essential (primary) hypertension: Secondary | ICD-10-CM | POA: Diagnosis not present

## 2021-01-01 DIAGNOSIS — M109 Gout, unspecified: Secondary | ICD-10-CM | POA: Diagnosis not present

## 2021-01-01 DIAGNOSIS — Z7401 Bed confinement status: Secondary | ICD-10-CM | POA: Diagnosis not present

## 2021-01-01 DIAGNOSIS — Z466 Encounter for fitting and adjustment of urinary device: Secondary | ICD-10-CM | POA: Diagnosis not present

## 2021-01-01 DIAGNOSIS — F0280 Dementia in other diseases classified elsewhere without behavioral disturbance: Secondary | ICD-10-CM | POA: Diagnosis not present

## 2021-01-01 DIAGNOSIS — Z8616 Personal history of COVID-19: Secondary | ICD-10-CM | POA: Diagnosis not present

## 2021-01-01 DIAGNOSIS — R339 Retention of urine, unspecified: Secondary | ICD-10-CM | POA: Diagnosis not present

## 2021-01-01 DIAGNOSIS — E7800 Pure hypercholesterolemia, unspecified: Secondary | ICD-10-CM | POA: Diagnosis not present

## 2021-01-01 DIAGNOSIS — M1990 Unspecified osteoarthritis, unspecified site: Secondary | ICD-10-CM | POA: Diagnosis not present

## 2021-01-01 DIAGNOSIS — M810 Age-related osteoporosis without current pathological fracture: Secondary | ICD-10-CM | POA: Diagnosis not present

## 2021-01-27 ENCOUNTER — Telehealth: Payer: Self-pay

## 2021-01-27 NOTE — Telephone Encounter (Signed)
(  3:55p) SW attempted to contact PCG to assess patient needs, medical status and offer a visit. SW was unable to leave a message on PCG cell phone and home number was busy.

## 2021-01-30 DIAGNOSIS — R32 Unspecified urinary incontinence: Secondary | ICD-10-CM | POA: Diagnosis not present

## 2021-01-30 DIAGNOSIS — I10 Essential (primary) hypertension: Secondary | ICD-10-CM | POA: Diagnosis not present

## 2021-01-30 DIAGNOSIS — R339 Retention of urine, unspecified: Secondary | ICD-10-CM | POA: Diagnosis not present

## 2021-01-30 DIAGNOSIS — Z466 Encounter for fitting and adjustment of urinary device: Secondary | ICD-10-CM | POA: Diagnosis not present

## 2021-01-30 DIAGNOSIS — F0280 Dementia in other diseases classified elsewhere without behavioral disturbance: Secondary | ICD-10-CM | POA: Diagnosis not present

## 2021-01-30 DIAGNOSIS — G301 Alzheimer's disease with late onset: Secondary | ICD-10-CM | POA: Diagnosis not present

## 2021-01-31 DIAGNOSIS — R339 Retention of urine, unspecified: Secondary | ICD-10-CM | POA: Diagnosis not present

## 2021-01-31 DIAGNOSIS — F0280 Dementia in other diseases classified elsewhere without behavioral disturbance: Secondary | ICD-10-CM | POA: Diagnosis not present

## 2021-01-31 DIAGNOSIS — E7800 Pure hypercholesterolemia, unspecified: Secondary | ICD-10-CM | POA: Diagnosis not present

## 2021-01-31 DIAGNOSIS — I10 Essential (primary) hypertension: Secondary | ICD-10-CM | POA: Diagnosis not present

## 2021-01-31 DIAGNOSIS — L409 Psoriasis, unspecified: Secondary | ICD-10-CM | POA: Diagnosis not present

## 2021-01-31 DIAGNOSIS — Z96651 Presence of right artificial knee joint: Secondary | ICD-10-CM | POA: Diagnosis not present

## 2021-01-31 DIAGNOSIS — Z7401 Bed confinement status: Secondary | ICD-10-CM | POA: Diagnosis not present

## 2021-01-31 DIAGNOSIS — F411 Generalized anxiety disorder: Secondary | ICD-10-CM | POA: Diagnosis not present

## 2021-01-31 DIAGNOSIS — I69393 Ataxia following cerebral infarction: Secondary | ICD-10-CM | POA: Diagnosis not present

## 2021-01-31 DIAGNOSIS — M109 Gout, unspecified: Secondary | ICD-10-CM | POA: Diagnosis not present

## 2021-01-31 DIAGNOSIS — M4800 Spinal stenosis, site unspecified: Secondary | ICD-10-CM | POA: Diagnosis not present

## 2021-01-31 DIAGNOSIS — Z87891 Personal history of nicotine dependence: Secondary | ICD-10-CM | POA: Diagnosis not present

## 2021-01-31 DIAGNOSIS — Z8616 Personal history of COVID-19: Secondary | ICD-10-CM | POA: Diagnosis not present

## 2021-01-31 DIAGNOSIS — G301 Alzheimer's disease with late onset: Secondary | ICD-10-CM | POA: Diagnosis not present

## 2021-01-31 DIAGNOSIS — Z9181 History of falling: Secondary | ICD-10-CM | POA: Diagnosis not present

## 2021-01-31 DIAGNOSIS — Z79899 Other long term (current) drug therapy: Secondary | ICD-10-CM | POA: Diagnosis not present

## 2021-01-31 DIAGNOSIS — Z87440 Personal history of urinary (tract) infections: Secondary | ICD-10-CM | POA: Diagnosis not present

## 2021-01-31 DIAGNOSIS — I69318 Other symptoms and signs involving cognitive functions following cerebral infarction: Secondary | ICD-10-CM | POA: Diagnosis not present

## 2021-01-31 DIAGNOSIS — M1990 Unspecified osteoarthritis, unspecified site: Secondary | ICD-10-CM | POA: Diagnosis not present

## 2021-01-31 DIAGNOSIS — F0150 Vascular dementia without behavioral disturbance: Secondary | ICD-10-CM | POA: Diagnosis not present

## 2021-01-31 DIAGNOSIS — Z466 Encounter for fitting and adjustment of urinary device: Secondary | ICD-10-CM | POA: Diagnosis not present

## 2021-01-31 DIAGNOSIS — M810 Age-related osteoporosis without current pathological fracture: Secondary | ICD-10-CM | POA: Diagnosis not present

## 2021-02-09 ENCOUNTER — Other Ambulatory Visit: Payer: Self-pay

## 2021-02-09 ENCOUNTER — Other Ambulatory Visit: Payer: Medicare Other

## 2021-02-09 DIAGNOSIS — Z515 Encounter for palliative care: Secondary | ICD-10-CM

## 2021-02-23 NOTE — Progress Notes (Signed)
COMMUNITY PALLIATIVE CARE SW NOTE  PATIENT NAME: Tammy Arnold DOB: January 01, 1941 MRN: [id number]  PRIMARY CARE PROVIDER: Kelton Pillar, MD  RESPONSIBLE PARTY:  Acct ID - Guarantor Home Phone Work Phone Relationship Acct Type  [id number] Arvin [phone]  Self P/F     [place], [place], [place] [address]    Due to the COVID-19, this visit was done via telephone from my office and it was initiated and consent by this patient and/or family.   PLAN OF CARE and INTERVENTIONS:             1. GOALS OF CARE/ ADVANCE CARE PLANNING:  The goal is to keep patient at home. Patient is a DNR.  2. SOCIAL/EMOTIONAL/SPIRITUAL ASSESSMENT/ INTERVENTIONS:  SW completed a telephonic visit with patient's husband, who provided a status update on patient. He reported that patient has shown some decline over the last week or so. He reported that patient is refusing intake. When she is offered any intake which consist of a liquid diet, patient is clinching his teeth together. PCG expressed concern, but states that patient has done this before. SW discussed with PCG hospice care and asked if he considered if this maybe where patient is in her status. He advised that he was was not sure if he wanted to consider hospice just yet as he wanted to observe patient a few more days. He advised that he will call SW if he feels his wife may need hospice. Patient remains bedbound. She is dependent for all personal care needs and is total care. Patient has private caregivers. SW provided assessment of needs and comfort of patient,and coping of PCG and reassurance of support.  3. PATIENT/CAREGIVER EDUCATION/ COPING:  PCG appears to be coping well.  4. PERSONAL EMERGENCY PLAN:  911 can be activated for emergencies. 5. COMMUNITY RESOURCES COORDINATION/ HEALTH CARE NAVIGATION:  Patient has private caregivers.  6. FINANCIAL/LEGAL CONCERNS/INTERVENTIONS:  None.     SOCIAL HX:  Social History   Tobacco Use  . Smoking  status: Never Smoker  . Smokeless tobacco: Never Used  . Tobacco comment: smoked very little in college none since  Substance Use Topics  . Alcohol use: No    Alcohol/week: 0.0 standard drinks    CODE STATUS: DNR ADVANCED DIRECTIVES: Yes MOST FORM COMPLETE:  No HOSPICE EDUCATION PROVIDED: Yes, but declined at this time.  PPS: Patient is bedbound and dependent for all ADL's. Her verbalizations are minimal to 1/2randomwords. Patient is only taking liquids. She has advanced dementia and is total care.  Durationtelephonicvisit and documentation30 minutes       [employer], LCSW

## 2021-02-24 DIAGNOSIS — G301 Alzheimer's disease with late onset: Secondary | ICD-10-CM | POA: Diagnosis not present

## 2021-02-24 DIAGNOSIS — R339 Retention of urine, unspecified: Secondary | ICD-10-CM | POA: Diagnosis not present

## 2021-02-24 DIAGNOSIS — I10 Essential (primary) hypertension: Secondary | ICD-10-CM | POA: Diagnosis not present

## 2021-02-24 DIAGNOSIS — Z466 Encounter for fitting and adjustment of urinary device: Secondary | ICD-10-CM | POA: Diagnosis not present

## 2021-02-24 DIAGNOSIS — F0280 Dementia in other diseases classified elsewhere without behavioral disturbance: Secondary | ICD-10-CM | POA: Diagnosis not present

## 2021-02-24 DIAGNOSIS — I69393 Ataxia following cerebral infarction: Secondary | ICD-10-CM | POA: Diagnosis not present

## 2021-03-02 DIAGNOSIS — M1990 Unspecified osteoarthritis, unspecified site: Secondary | ICD-10-CM | POA: Diagnosis not present

## 2021-03-02 DIAGNOSIS — Z79899 Other long term (current) drug therapy: Secondary | ICD-10-CM | POA: Diagnosis not present

## 2021-03-02 DIAGNOSIS — Z466 Encounter for fitting and adjustment of urinary device: Secondary | ICD-10-CM | POA: Diagnosis not present

## 2021-03-02 DIAGNOSIS — L409 Psoriasis, unspecified: Secondary | ICD-10-CM | POA: Diagnosis not present

## 2021-03-02 DIAGNOSIS — F0280 Dementia in other diseases classified elsewhere without behavioral disturbance: Secondary | ICD-10-CM | POA: Diagnosis not present

## 2021-03-02 DIAGNOSIS — F411 Generalized anxiety disorder: Secondary | ICD-10-CM | POA: Diagnosis not present

## 2021-03-02 DIAGNOSIS — G301 Alzheimer's disease with late onset: Secondary | ICD-10-CM | POA: Diagnosis not present

## 2021-03-02 DIAGNOSIS — E7800 Pure hypercholesterolemia, unspecified: Secondary | ICD-10-CM | POA: Diagnosis not present

## 2021-03-02 DIAGNOSIS — R339 Retention of urine, unspecified: Secondary | ICD-10-CM | POA: Diagnosis not present

## 2021-03-02 DIAGNOSIS — Z8616 Personal history of COVID-19: Secondary | ICD-10-CM | POA: Diagnosis not present

## 2021-03-02 DIAGNOSIS — I10 Essential (primary) hypertension: Secondary | ICD-10-CM | POA: Diagnosis not present

## 2021-03-02 DIAGNOSIS — M810 Age-related osteoporosis without current pathological fracture: Secondary | ICD-10-CM | POA: Diagnosis not present

## 2021-03-02 DIAGNOSIS — I69393 Ataxia following cerebral infarction: Secondary | ICD-10-CM | POA: Diagnosis not present

## 2021-03-02 DIAGNOSIS — Z7401 Bed confinement status: Secondary | ICD-10-CM | POA: Diagnosis not present

## 2021-03-02 DIAGNOSIS — Z87440 Personal history of urinary (tract) infections: Secondary | ICD-10-CM | POA: Diagnosis not present

## 2021-03-02 DIAGNOSIS — M4800 Spinal stenosis, site unspecified: Secondary | ICD-10-CM | POA: Diagnosis not present

## 2021-03-02 DIAGNOSIS — Z9181 History of falling: Secondary | ICD-10-CM | POA: Diagnosis not present

## 2021-03-02 DIAGNOSIS — I69318 Other symptoms and signs involving cognitive functions following cerebral infarction: Secondary | ICD-10-CM | POA: Diagnosis not present

## 2021-03-02 DIAGNOSIS — M109 Gout, unspecified: Secondary | ICD-10-CM | POA: Diagnosis not present

## 2021-03-02 DIAGNOSIS — F0150 Vascular dementia without behavioral disturbance: Secondary | ICD-10-CM | POA: Diagnosis not present

## 2021-03-02 DIAGNOSIS — Z87891 Personal history of nicotine dependence: Secondary | ICD-10-CM | POA: Diagnosis not present

## 2021-03-02 DIAGNOSIS — Z96651 Presence of right artificial knee joint: Secondary | ICD-10-CM | POA: Diagnosis not present

## 2021-03-13 ENCOUNTER — Other Ambulatory Visit: Payer: Self-pay

## 2021-03-13 ENCOUNTER — Other Ambulatory Visit: Payer: Medicare Other

## 2021-03-13 DIAGNOSIS — Z515 Encounter for palliative care: Secondary | ICD-10-CM

## 2021-03-17 ENCOUNTER — Other Ambulatory Visit (HOSPITAL_COMMUNITY): Payer: Self-pay

## 2021-03-17 NOTE — Progress Notes (Signed)
COMMUNITY PALLIATIVE CARE SW NOTE  PATIENT NAME: Tammy Arnold DOB: 12/29/1940 MRN: [id number]  PRIMARY CARE PROVIDER: Kelton Pillar, MD  RESPONSIBLE PARTY:  Acct ID - Guarantor Home Phone Work Phone Relationship Acct Type  [id number] Arvin [phone]  Self P/F     [place], [place], [place] [address]   Due to the COVID-19 crisis, this virtual check-in visit was done via telephone from my office and it was initiated and consent by this patient and or family   PLAN OF CARE and INTERVENTIONS:             1. GOALS OF CARE/ ADVANCE CARE PLANNING:  The goal is for patient to remain at home with her husband. Patient is a DNR. 2. SOCIAL/EMOTIONAL/SPIRITUAL ASSESSMENT/ INTERVENTIONS:  SW completed a telehealth visit with patient's husband-Dr. Ulanda Edison. He reported that patient has been stable. He reported that patient's intake is back at baseline-her intake is good. Dr. Ulanda Edison states that the episodes where patient was clinching her teeth together was short-lived and she was back at baseline. He report that patient remains bedbound and dependent for all ADL's-patient is total care. She has sitters for 40 hours a week. He continues to see patient two days a week, which he states is an outlet for him. Dr.  Ulanda Edison reported no needs at this time. SW reinforced access to palliative care support and encouraged him to call with any questions or concerns.  3. PATIENT/CAREGIVER EDUCATION/ COPING: Patient and PCG seem to be coping well.  4. PERSONAL EMERGENCY PLAN:  911 can be activated for emergencies. 5. COMMUNITY RESOURCES COORDINATION/ HEALTH CARE NAVIGATION:  Patient has private caregivers.  6. FINANCIAL/LEGAL CONCERNS/INTERVENTIONS:  None.     SOCIAL HX:  Social History   Tobacco Use  . Smoking status: Never Smoker  . Smokeless tobacco: Never Used  . Tobacco comment: smoked very little in college none since  Substance Use Topics  . Alcohol use: No    Alcohol/week: 0.0 standard  drinks    CODE STATUS: DNR ADVANCED DIRECTIVES: Yes MOST FORM COMPLETE: No HOSPICE EDUCATION PROVIDED: No  PPS: Patient is bedbound and dependent for all ADL's. Her verbalizations are minimal to 1/2randomwords. Patient is only taking liquids. She has advanced dementia and is total care.  Durationtelephonicvisit and documentation22minutes       [employer], LCSW

## 2021-03-20 DIAGNOSIS — I10 Essential (primary) hypertension: Secondary | ICD-10-CM | POA: Diagnosis not present

## 2021-03-20 DIAGNOSIS — G301 Alzheimer's disease with late onset: Secondary | ICD-10-CM | POA: Diagnosis not present

## 2021-03-20 DIAGNOSIS — R339 Retention of urine, unspecified: Secondary | ICD-10-CM | POA: Diagnosis not present

## 2021-03-20 DIAGNOSIS — Z466 Encounter for fitting and adjustment of urinary device: Secondary | ICD-10-CM | POA: Diagnosis not present

## 2021-03-20 DIAGNOSIS — I69393 Ataxia following cerebral infarction: Secondary | ICD-10-CM | POA: Diagnosis not present

## 2021-03-20 DIAGNOSIS — F0280 Dementia in other diseases classified elsewhere without behavioral disturbance: Secondary | ICD-10-CM | POA: Diagnosis not present

## 2021-03-31 DIAGNOSIS — Z466 Encounter for fitting and adjustment of urinary device: Secondary | ICD-10-CM | POA: Diagnosis not present

## 2021-03-31 DIAGNOSIS — R339 Retention of urine, unspecified: Secondary | ICD-10-CM | POA: Diagnosis not present

## 2021-03-31 DIAGNOSIS — F0280 Dementia in other diseases classified elsewhere without behavioral disturbance: Secondary | ICD-10-CM | POA: Diagnosis not present

## 2021-03-31 DIAGNOSIS — L02611 Cutaneous abscess of right foot: Secondary | ICD-10-CM | POA: Diagnosis not present

## 2021-03-31 DIAGNOSIS — L03031 Cellulitis of right toe: Secondary | ICD-10-CM | POA: Diagnosis not present

## 2021-03-31 DIAGNOSIS — G301 Alzheimer's disease with late onset: Secondary | ICD-10-CM | POA: Diagnosis not present

## 2021-03-31 DIAGNOSIS — I10 Essential (primary) hypertension: Secondary | ICD-10-CM | POA: Diagnosis not present

## 2021-03-31 DIAGNOSIS — I69393 Ataxia following cerebral infarction: Secondary | ICD-10-CM | POA: Diagnosis not present

## 2021-04-01 DIAGNOSIS — M4800 Spinal stenosis, site unspecified: Secondary | ICD-10-CM | POA: Diagnosis not present

## 2021-04-01 DIAGNOSIS — G301 Alzheimer's disease with late onset: Secondary | ICD-10-CM | POA: Diagnosis not present

## 2021-04-01 DIAGNOSIS — Z466 Encounter for fitting and adjustment of urinary device: Secondary | ICD-10-CM | POA: Diagnosis not present

## 2021-04-01 DIAGNOSIS — Z8616 Personal history of COVID-19: Secondary | ICD-10-CM | POA: Diagnosis not present

## 2021-04-01 DIAGNOSIS — E7800 Pure hypercholesterolemia, unspecified: Secondary | ICD-10-CM | POA: Diagnosis not present

## 2021-04-01 DIAGNOSIS — M1990 Unspecified osteoarthritis, unspecified site: Secondary | ICD-10-CM | POA: Diagnosis not present

## 2021-04-01 DIAGNOSIS — I69393 Ataxia following cerebral infarction: Secondary | ICD-10-CM | POA: Diagnosis not present

## 2021-04-01 DIAGNOSIS — R339 Retention of urine, unspecified: Secondary | ICD-10-CM | POA: Diagnosis not present

## 2021-04-01 DIAGNOSIS — Z87440 Personal history of urinary (tract) infections: Secondary | ICD-10-CM | POA: Diagnosis not present

## 2021-04-01 DIAGNOSIS — Z7401 Bed confinement status: Secondary | ICD-10-CM | POA: Diagnosis not present

## 2021-04-01 DIAGNOSIS — F0280 Dementia in other diseases classified elsewhere without behavioral disturbance: Secondary | ICD-10-CM | POA: Diagnosis not present

## 2021-04-01 DIAGNOSIS — M109 Gout, unspecified: Secondary | ICD-10-CM | POA: Diagnosis not present

## 2021-04-01 DIAGNOSIS — Z9181 History of falling: Secondary | ICD-10-CM | POA: Diagnosis not present

## 2021-04-01 DIAGNOSIS — Z87891 Personal history of nicotine dependence: Secondary | ICD-10-CM | POA: Diagnosis not present

## 2021-04-01 DIAGNOSIS — M810 Age-related osteoporosis without current pathological fracture: Secondary | ICD-10-CM | POA: Diagnosis not present

## 2021-04-01 DIAGNOSIS — F411 Generalized anxiety disorder: Secondary | ICD-10-CM | POA: Diagnosis not present

## 2021-04-01 DIAGNOSIS — I69318 Other symptoms and signs involving cognitive functions following cerebral infarction: Secondary | ICD-10-CM | POA: Diagnosis not present

## 2021-04-01 DIAGNOSIS — Z79899 Other long term (current) drug therapy: Secondary | ICD-10-CM | POA: Diagnosis not present

## 2021-04-01 DIAGNOSIS — I10 Essential (primary) hypertension: Secondary | ICD-10-CM | POA: Diagnosis not present

## 2021-04-01 DIAGNOSIS — F0150 Vascular dementia without behavioral disturbance: Secondary | ICD-10-CM | POA: Diagnosis not present

## 2021-04-01 DIAGNOSIS — L409 Psoriasis, unspecified: Secondary | ICD-10-CM | POA: Diagnosis not present

## 2021-04-01 DIAGNOSIS — Z96651 Presence of right artificial knee joint: Secondary | ICD-10-CM | POA: Diagnosis not present

## 2021-04-17 DIAGNOSIS — Z466 Encounter for fitting and adjustment of urinary device: Secondary | ICD-10-CM | POA: Diagnosis not present

## 2021-04-17 DIAGNOSIS — F0280 Dementia in other diseases classified elsewhere without behavioral disturbance: Secondary | ICD-10-CM | POA: Diagnosis not present

## 2021-04-17 DIAGNOSIS — I10 Essential (primary) hypertension: Secondary | ICD-10-CM | POA: Diagnosis not present

## 2021-04-17 DIAGNOSIS — I69393 Ataxia following cerebral infarction: Secondary | ICD-10-CM | POA: Diagnosis not present

## 2021-04-17 DIAGNOSIS — G301 Alzheimer's disease with late onset: Secondary | ICD-10-CM | POA: Diagnosis not present

## 2021-04-17 DIAGNOSIS — R339 Retention of urine, unspecified: Secondary | ICD-10-CM | POA: Diagnosis not present

## 2021-04-22 ENCOUNTER — Telehealth: Payer: Self-pay | Admitting: *Deleted

## 2021-04-22 NOTE — Telephone Encounter (Signed)
Called and spoke with husband to schedule a palliative care visit. He states he is currently in the office seeing patient's and will call back later.

## 2021-04-24 ENCOUNTER — Other Ambulatory Visit: Payer: Medicare Other | Admitting: *Deleted

## 2021-04-24 ENCOUNTER — Other Ambulatory Visit: Payer: Self-pay

## 2021-04-24 DIAGNOSIS — Z515 Encounter for palliative care: Secondary | ICD-10-CM

## 2021-04-24 NOTE — Progress Notes (Signed)
COMMUNITY PALLIATIVE CARE RN NOTE  PATIENT NAME: Tammy Arnold DOB: [birth year] MRN: [id number]  PRIMARY CARE PROVIDER: Kelton Pillar, MD  RESPONSIBLE PARTY: Newton Pigg MD (husband) Acct ID - Guarantor Home Phone Work Phone Relationship Acct Type  [id number] KOREN, [phone]  Self P/F     [place], [place], [place] [address]   Due to the COVID-19 crisis, this virtual check-in visit was done via telephone from my office and it was initiated and consent by this patient and or family.  PLAN OF CARE and INTERVENTION:  ADVANCE CARE PLANNING/GOALS OF CARE: Goal is for patient to remain at home with her husband and avoid hospitalizations. She is a Full code. PATIENT/CAREGIVER EDUCATION: Symptom management DISEASE STATUS: Virtual check-in visit completed via telephone. Husband reports that patient has not been complaining of any pain. He says her mood is very pleasant and that overall she is "holding her own." No issues with shortness of breath. She remains total care with all ADLs with hired caregivers for 40 hours/week. Her intake remains at baseline. She continues with an indwelling foley catheter and has had no issues with this. No recent suspicion of UTIs. She is incontinent of her bowels and wears adult briefs. He has no concerns at this time. Will continue to monitor.  HISTORY OF PRESENT ILLNESS:  This is a 80 yo female with a diagnosis of Alzheimer's dementia. She has a past medical history of TIA, HTN, HLD, urinary retention and spinal stenosis. Palliative care team continues to follow patient for additional support.  CODE STATUS: Full code ADVANCED DIRECTIVES: Y MOST FORM: yes PPS: 30%   (Duration of visit and documentation 20 minutes)   Daryl Eastern, RN BSN

## 2021-04-28 ENCOUNTER — Telehealth: Payer: Self-pay

## 2021-04-28 NOTE — Telephone Encounter (Signed)
I connected by phone with Corky Sox and/or patient's caregiver on 04/28/2021 at 10:15 AM to discuss the potential vaccination through our Homebound vaccination initiative.   Prevaccination Checklist for COVID-19 Vaccines  1.  Are you feeling sick today? yes  2.  Have you ever received a dose of a COVID-19 vaccine?  yes      If yes, which one? Moderna   How many dose of Covid-19 vaccine have your received and dates ? 3, 04/01/2020, 04/29/2020, 11/05/2020  Check all that apply: I live in a long-term care setting. no I have been diagnosed with a medical condition(s). 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 no  4. Do you have a health condition or are you undergoing treatment that makes you moderately or severely immunocompromised? (This would include treatment for cancer or HIV, receipt of organ transplant, immunosuppressive therapy or high-dose corticosteroids, CAR-T-cell therapy, hematopoietic cell transplant [HCT], DiGeorge syndrome or Wiskott-Aldrich syndrome)  no  5. Have you received hematopoietic cell transplant (HCT) or CAR-T-cell therapies since receiving COVID-19 vaccine? no  6.  Have you ever had an allergic reaction: (This would include a severe reaction [ e.g., anaphylaxis] that required treatment with epinephrine or EpiPen or that caused you to go to the hospital.  It would also include an allergic reaction that occurred within 4 hours that caused hives, swelling, or respiratory distress, including wheezing.) A.  A previous dose of COVID-19 vaccine. no  B.  A vaccine or injectable therapy that contains multiple components, one of which is a COVID-19 vaccine component, but it is not known which component elicited the immediate reaction. no  C.  Are you allergic to polyethylene  glycol? no  D. Are you allergic to Polysorbate, which is found in some vaccines, film coated tablets and intravenous steroids?  no   7.  Have you ever had an allergic reaction to another vaccine (other than COVID-19 vaccine) or an injectable medication? (This would include a severe reaction [ e.g., anaphylaxis] that required treatment with epinephrine or EpiPen or that caused you to go to the hospital.  It would also include an allergic reaction that occurred within 4 hours that caused hives, swelling, or respiratory distress, including wheezing.)  no   8.  Have you ever had a severe allergic reaction (e.g., anaphylaxis) to something other than a component of the COVID-19 vaccine, or any vaccine or injectable medication?  This would include food, pet, venom, environmental, or oral medication allergies.  yes, patient has multiple medication allergies with Penicillin and Sulfa drugs included.  Check all that apply to you:  Am a female between ages 68 and 67 years old  no  Women 93 through 79 years of age can receive any FDA-authorized or -approved COVID-19 vaccine. However, they should be informed of the rare but increased risk of thrombosis with thrombocytopenia syndrome (TTS) after receipt of the [employer] Vaccine and the availability of other FDA-authorized and -approved COVID-19 vaccines. People who had TTS after a first dose of Janssen vaccine should not receive a subsequent dose of Janssen product    Am a female between ages 50 and 46 years old  no Males 5 through 79 years of age may receive the correct formulation of Pfizer-BioNTech COVID-19 vaccine. Males 18 and older can receive any FDA-authorized or -approved vaccine.  However, people receiving an mRNA COVID-19 vaccine, especially males 23 through 79 years of age and their parents/legal representative (when relevant), should be informed of the risk of developing myocarditis (an inflammation of the heart muscle) or pericarditis (inflammation  of the lining around the heart) after receipt of an mRNA vaccine. The risk of developing either myocarditis or pericarditis after vaccination is low, and lower than the risk of myocarditis associated with SARS-CoV-2 infection in adolescents and adults. Vaccine recipients should be counseled about the need to seek care if symptoms of myocarditis or pericarditis develop after vaccination     Have a history of myocarditis or pericarditis  no Myocarditis or pericarditis after receipt of the first dose of an mRNA COVID-19 vaccine series but before administration of the second dose  Experts advise that people who develop myocarditis or pericarditis after a dose of an mRNA COVID-19 vaccine not receive a subsequent dose of any COVID-19 vaccine, until additional safety data are available.  Administration of a subsequent dose of COVID-19 vaccine before safety data are available can be considered in certain circumstances after the episode of myocarditis or pericarditis has completely resolved. Until additional data are available, some experts recommend a Alphonsa Overall COVID-19 vaccine be considered instead of an mRNA COVID-19 vaccine. Decisions about proceeding with a subsequent dose should include a conversation between the patient, their parent/legal representative (when relevant), and their clinical team, which may include a cardiologist.    Have been treated with monoclonal antibodies or convalescent serum to prevent or treat COVID-19  no Vaccination should be offered to people regardless of history of prior symptomatic or asymptomatic SARS-CoV-2 infection. There is no recommended minimal interval between infection and vaccination.  However, vaccination should be deferred if a patient received monoclonal antibodies or convalescent serum as treatment for COVID-19 or for post-exposure prophylaxis. This is a precautionary measure until additional information becomes available, to avoid interference of the antibody  treatment with vaccine-induced immune responses.  Defer COVID-19 vaccination for 30 days when a passive antibody product was used for post-exposure prophylaxis.  Defer COVID-19 vaccination for 90 days when a passive antibody product was used to treat COVID-19.     Diagnosed with Multisystem Inflammatory Syndrome (MIS-C or MIS-A) after a COVID-19 infection  no It is unknown if people with a history of MIS-C or MIS-A are at risk for a dysregulated immune response to COVID-19 vaccination.  People with a history of MIS-C or MIS-A may choose to be vaccinated. Considerations for vaccination may include:  Clinical recovery from MIS-C or MIS-A, including return to normal cardiac function  Personal risk of severe acute COVID-19 (e.g., age, underlying conditions)  High or substantial community transmission of SARS-CoV-2 and personal increased risk of reinfection.  Timing of any immunomodulatory therapies (general best practice guidelines for immunization can be consulted for more information [email or web address])  It has been 90 days or more since their diagnosis of MIS-C  Onset of MIS-C occurred before any COVID-19 vaccination   A conversation between the patient, their guardian(s), and their clinical team or a specialist may assist with COVID-19 vaccination decisions. Healthcare providers and health departments may also request a consultation from the [place] at [email or web address].     Have a bleeding disorder  no Take a blood thinner  no As with all vaccines, any COVID-19 vaccine product may be given to these patients, if a physician familiar with the patient's bleeding risk determines that the vaccine can be administered intramuscularly with reasonable safety.  ACIP recommends the following technique for intramuscular vaccination in patients with bleeding disorders or  taking blood thinners: a fine-gauge needle (23-gauge or smaller caliber) should be used for the vaccination, followed by firm pressure on the site, without rubbing, for at least 2 minutes.  People who regularly take aspirin or anticoagulants as part of their routine medications do not need to stop these medications prior to receipt of any COVID-19 vaccine.    Have a history of heparin-induced thrombocytopenia (HIT)  no Although the etiology of TTS associated with the Alphonsa Overall COVID-19 vaccine is unclear, it appears to be similar to another rare immune-mediated syndrome, heparin-induced thrombocytopenia (HIT). People with a history of an episode of an immune-mediated syndrome characterized by thrombosis and thrombocytopenia, such as HIT, should be offered a currently FDA-approved or FDA-authorized mRNA COVID-19 vaccine if it has been ?90 days since their TTS resolved. After 90 days, patients may be vaccinated with any currently FDA-approved or FDA-authorized COVID-19 vaccine, including Janssen COVID-19 Vaccine. However, people who developed TTS after their initial Alphonsa Overall vaccine should not receive a Janssen booster dose.  Experts believe the following factors do not make people more susceptible to TTS after receipt of the [employer]. People with these conditions can be vaccinated with any FDA-authorized or - approved COVID-19 vaccine, including the [employer] COVID-19 Vaccine:  A prior history of venous thromboembolism  Risk factors for venous thromboembolism (e.g., inherited or acquired thrombophilia including Factor V Leiden; prothrombin gene 20210A mutation; antiphospholipid syndrome; protein C, protein S or antithrombin deficiency  A prior history of other types of thromboses not associated with thrombocytopenia  Pregnancy, post-partum status, or receipt of hormonal contraceptives (e.g., combined oral contraceptives, patch, ring)   Additional recipient education materials can be found at  http://gutierrez-robinson.com/ vaccines/safety/JJUpdate.html.    Am currently pregnant or breastfeeding  no Vaccination is recommended for all people aged 60 years and older, including people that are:  Pregnant  Breastfeeding  Trying to get pregnant now or who might become pregnant in the future   Pregnant, breastfeeding, and post-partum people 20 through 79 years of age should be aware of the rare risk of TTS after receipt of the Alphonsa Overall COVID-19 Vaccine and the availability of other FDA-authorized or -approved COVID-19 vaccines (i.e., mRNA vaccines).    Have received dermal fillers  no FDA-authorized or -approved COVID-19 vaccines can be administered to people who have received injectable dermal fillers who have no contraindications for vaccination.  Infrequently, these people might experience temporary swelling at or near the site of filler injection (usually the face or lips) following administration of a dose of an mRNA COVID-19 vaccine. These people should be advised to contact their healthcare provider if swelling develops at or near the site of dermal filler following vaccination.     Have a history of Guillain-Barr Syndrome (GBS)  no People with a history of GBS can receive any FDA-authorized or -approved COVID-19 vaccine. However, given the possible association between the [employer] and an increased risk of GBS, a patient with a history of GBS and their clinical team should discuss the availability of mRNA vaccines to offer protection against COVID-19. The highest risk has been observed in men aged 54-64 years with symptoms of GBS beginning within 42 days after Alphonsa Overall COVID-19 vaccination.  People who had GBS after receiving Janssen vaccine should be made aware of the option to receive an mRNA COVID-19 vaccine booster at least 2 months (8 weeks) after the Janssen dose. However, Janssen vaccine may  be used as a booster, particularly if GBS occurred more than 42 days  after vaccination or was related to a non-vaccine factor. Prior to booster vaccination, a conversation between the patient and their clinical team may assist with decisions about use of a COVID-19 booster dose, including the timing of administration     Postvaccination Observation Times for People without Contraindications to Covid 19 Vaccination.  30 minutes: People with a history of: A contraindication to another type of COVID-19 vaccine product (i.e., mRNA or viral vector COVID-19 vaccines)  Immediate (within 4 hours of exposure) non-severe allergic reaction to a COVID-19 vaccine or injectable therapies  Anaphylaxis due to any cause  Immediate allergic reaction of any severity to a non-COVID-19 vaccine   15 minutes: All other people  This patient is a 80 y.o. female that meets the FDA criteria to receive homebound vaccination. Patient or parent/caregiver understands they have the option to accept or refuse homebound vaccination. Patient passed the pre-screening checklist and would like to proceed with homebound vaccination. Based on questionnaire above, I recommend the patient be observed for 15 minutes. There are an estimated #0 other household members/caregivers who are also interested in receiving the vaccine.    The patient has been confirmed homebound and eligible for homebound vaccination with the considerations outlined above. I will send the patient's information to our scheduling team who will reach out to schedule the patient and potential caregiver/family members for homebound vaccination.    Dan Humphreys 04/28/2021 10:15 AM

## 2021-05-01 DIAGNOSIS — R339 Retention of urine, unspecified: Secondary | ICD-10-CM | POA: Diagnosis not present

## 2021-05-01 DIAGNOSIS — M810 Age-related osteoporosis without current pathological fracture: Secondary | ICD-10-CM | POA: Diagnosis not present

## 2021-05-01 DIAGNOSIS — Z466 Encounter for fitting and adjustment of urinary device: Secondary | ICD-10-CM | POA: Diagnosis not present

## 2021-05-01 DIAGNOSIS — Z79899 Other long term (current) drug therapy: Secondary | ICD-10-CM | POA: Diagnosis not present

## 2021-05-01 DIAGNOSIS — Z96651 Presence of right artificial knee joint: Secondary | ICD-10-CM | POA: Diagnosis not present

## 2021-05-01 DIAGNOSIS — E7800 Pure hypercholesterolemia, unspecified: Secondary | ICD-10-CM | POA: Diagnosis not present

## 2021-05-01 DIAGNOSIS — Z87440 Personal history of urinary (tract) infections: Secondary | ICD-10-CM | POA: Diagnosis not present

## 2021-05-01 DIAGNOSIS — Z9181 History of falling: Secondary | ICD-10-CM | POA: Diagnosis not present

## 2021-05-01 DIAGNOSIS — Z87891 Personal history of nicotine dependence: Secondary | ICD-10-CM | POA: Diagnosis not present

## 2021-05-01 DIAGNOSIS — L409 Psoriasis, unspecified: Secondary | ICD-10-CM | POA: Diagnosis not present

## 2021-05-01 DIAGNOSIS — F411 Generalized anxiety disorder: Secondary | ICD-10-CM | POA: Diagnosis not present

## 2021-05-01 DIAGNOSIS — F0150 Vascular dementia without behavioral disturbance: Secondary | ICD-10-CM | POA: Diagnosis not present

## 2021-05-01 DIAGNOSIS — G301 Alzheimer's disease with late onset: Secondary | ICD-10-CM | POA: Diagnosis not present

## 2021-05-01 DIAGNOSIS — M109 Gout, unspecified: Secondary | ICD-10-CM | POA: Diagnosis not present

## 2021-05-01 DIAGNOSIS — I69318 Other symptoms and signs involving cognitive functions following cerebral infarction: Secondary | ICD-10-CM | POA: Diagnosis not present

## 2021-05-01 DIAGNOSIS — M4800 Spinal stenosis, site unspecified: Secondary | ICD-10-CM | POA: Diagnosis not present

## 2021-05-01 DIAGNOSIS — F0280 Dementia in other diseases classified elsewhere without behavioral disturbance: Secondary | ICD-10-CM | POA: Diagnosis not present

## 2021-05-01 DIAGNOSIS — M1990 Unspecified osteoarthritis, unspecified site: Secondary | ICD-10-CM | POA: Diagnosis not present

## 2021-05-01 DIAGNOSIS — Z8616 Personal history of COVID-19: Secondary | ICD-10-CM | POA: Diagnosis not present

## 2021-05-01 DIAGNOSIS — I10 Essential (primary) hypertension: Secondary | ICD-10-CM | POA: Diagnosis not present

## 2021-05-01 DIAGNOSIS — Z7401 Bed confinement status: Secondary | ICD-10-CM | POA: Diagnosis not present

## 2021-05-01 DIAGNOSIS — I69393 Ataxia following cerebral infarction: Secondary | ICD-10-CM | POA: Diagnosis not present

## 2021-05-05 ENCOUNTER — Ambulatory Visit: Payer: Medicare Other | Attending: Critical Care Medicine

## 2021-05-05 ENCOUNTER — Other Ambulatory Visit: Payer: Self-pay

## 2021-05-05 ENCOUNTER — Ambulatory Visit: Payer: Medicare Other

## 2021-05-05 DIAGNOSIS — Z23 Encounter for immunization: Secondary | ICD-10-CM

## 2021-05-05 NOTE — Progress Notes (Signed)
   Covid-19 Vaccination Clinic  Name:  Tammy Arnold    MRN: [id number] DOB: 07-26-1941  05/05/2021  [name] was observed post Covid-19 immunization for 15 minutes without incident. She was provided with Vaccine Information Sheet and instruction to access the V-Safe system.   [name] was instructed to call 911 with any severe reactions post vaccine: Difficulty breathing  Swelling of face and throat  A fast heartbeat  A bad rash all over body  Dizziness and weakness   Immunizations Administered     Name Date Dose VIS Date Route   Moderna Covid-19 Booster Vaccine 05/05/2021  3:21 PM 0.25 mL 08/13/2020 Intramuscular   Manufacturer: Levan Hurst   Lot: [id number]   [place]: [id number]       Covid-19 Vaccination Clinic  Name:  Tammy Arnold    MRN: [id number] DOB: 1941/04/03  05/05/2021  [name] was observed post Covid-19 immunization for 15 minutes without incident. She was provided with Vaccine Information Sheet and instruction to access the V-Safe system.   [name] was instructed to call 911 with any severe reactions post vaccine: Difficulty breathing  Swelling of face and throat  A fast heartbeat  A bad rash all over body  Dizziness and weakness   Immunizations Administered     Name Date Dose VIS Date Route   Moderna Covid-19 Booster Vaccine 05/05/2021  3:21 PM 0.25 mL 08/13/2020 Intramuscular   Manufacturer: Levan Hurst   Lot: [id number]   [place]: [id number]       Covid-19 Vaccination Clinic  Name:  Tammy Arnold    MRN: [id number] DOB: Oct 27, 1940  05/05/2021  [name] was observed post Covid-19 immunization for 15 minutes without incident. She was provided with Vaccine Information Sheet and instruction to access the V-Safe system.   [name] was instructed to call 911 with any severe reactions post vaccine: Difficulty breathing  Swelling of face and throat  A fast heartbeat  A bad rash all over body  Dizziness and weakness   Immunizations  Administered     Name Date Dose VIS Date Route   Moderna Covid-19 Booster Vaccine 05/05/2021  3:21 PM 0.25 mL 08/13/2020 Intramuscular   Manufacturer: Moderna   Lot: [id number]   [place]: [phone]

## 2021-05-26 ENCOUNTER — Other Ambulatory Visit: Payer: Self-pay

## 2021-05-26 ENCOUNTER — Other Ambulatory Visit: Payer: Medicare Other

## 2021-05-26 DIAGNOSIS — Z515 Encounter for palliative care: Secondary | ICD-10-CM

## 2021-05-26 DIAGNOSIS — F0280 Dementia in other diseases classified elsewhere without behavioral disturbance: Secondary | ICD-10-CM | POA: Diagnosis not present

## 2021-05-26 DIAGNOSIS — I69393 Ataxia following cerebral infarction: Secondary | ICD-10-CM | POA: Diagnosis not present

## 2021-05-26 DIAGNOSIS — Z466 Encounter for fitting and adjustment of urinary device: Secondary | ICD-10-CM | POA: Diagnosis not present

## 2021-05-26 DIAGNOSIS — G301 Alzheimer's disease with late onset: Secondary | ICD-10-CM | POA: Diagnosis not present

## 2021-05-26 DIAGNOSIS — I10 Essential (primary) hypertension: Secondary | ICD-10-CM | POA: Diagnosis not present

## 2021-05-26 DIAGNOSIS — R339 Retention of urine, unspecified: Secondary | ICD-10-CM | POA: Diagnosis not present

## 2021-05-27 NOTE — Progress Notes (Signed)
COMMUNITY PALLIATIVE CARE SW NOTE  PATIENT NAME: Tammy Arnold DOB: 09-10-1941 MRN: [id number]  PRIMARY CARE PROVIDER: Kelton Pillar, MD  RESPONSIBLE PARTY:  Acct ID - Guarantor Home Phone Work Phone Relationship Acct Type  [id number] Tammy [phone]  Self P/F     [address], [place], [place] [address]   Due to the COVID-19 crisis, this virtual check-in visit was done via telephone from my office and it was initiated and consent by this patient and or family.   PLAN OF CARE and INTERVENTIONS:             GOALS OF CARE/ ADVANCE CARE PLANNING:  The goal is for patient to remain in her home. Patient is a DNR. SOCIAL/EMOTIONAL/SPIRITUAL ASSESSMENT/ INTERVENTIONS:  SW completed a telehealth visit with patient's husband-Dr. Ulanda Arnold. He reported that patient's catheter became clogged and it was changed today by home heath nurse. Patient tolerated this well. [name] liquid intake remains good. She remains bedbound and dependent for all ADL's. Patient continues to have sitters for 40 hours per week. Dr. Ulanda Arnold verbalized no other concerns.  PATIENT/CAREGIVER EDUCATION/ COPING:  PCG report that he is doing well.  PERSONAL EMERGENCY PLAN:  911 can be activated for emergencies. COMMUNITY RESOURCES COORDINATION/ HEALTH CARE NAVIGATION:  Patient has private sitters for 40 hours a week.  FINANCIAL/LEGAL CONCERNS/INTERVENTIONS:  None.      SOCIAL HX:  Social History   Tobacco Use   Smoking status: Never   Smokeless tobacco: Never   Tobacco comments:    smoked very little in college none since  Substance Use Topics   Alcohol use: No    Alcohol/week: 0.0 standard drinks    CODE STATUS: DNR ADVANCED DIRECTIVES: Yes MOST FORM COMPLETE: No HOSPICE EDUCATION PROVIDED: No  PPS:  Patient is bedbound and dependent for all ADL's. Her verbalizations are minimal to 1/2 random words. Patient is only taking liquids. She has advanced dementia and is total care.   Duration telephonic  visit and documentation 30 minutes     Tammy Puller, LCSW

## 2021-05-31 DIAGNOSIS — Z79899 Other long term (current) drug therapy: Secondary | ICD-10-CM | POA: Diagnosis not present

## 2021-05-31 DIAGNOSIS — M109 Gout, unspecified: Secondary | ICD-10-CM | POA: Diagnosis not present

## 2021-05-31 DIAGNOSIS — Z8616 Personal history of COVID-19: Secondary | ICD-10-CM | POA: Diagnosis not present

## 2021-05-31 DIAGNOSIS — Z87891 Personal history of nicotine dependence: Secondary | ICD-10-CM | POA: Diagnosis not present

## 2021-05-31 DIAGNOSIS — F0150 Vascular dementia without behavioral disturbance: Secondary | ICD-10-CM | POA: Diagnosis not present

## 2021-05-31 DIAGNOSIS — R339 Retention of urine, unspecified: Secondary | ICD-10-CM | POA: Diagnosis not present

## 2021-05-31 DIAGNOSIS — Z96651 Presence of right artificial knee joint: Secondary | ICD-10-CM | POA: Diagnosis not present

## 2021-05-31 DIAGNOSIS — I69318 Other symptoms and signs involving cognitive functions following cerebral infarction: Secondary | ICD-10-CM | POA: Diagnosis not present

## 2021-05-31 DIAGNOSIS — Z9181 History of falling: Secondary | ICD-10-CM | POA: Diagnosis not present

## 2021-05-31 DIAGNOSIS — F0280 Dementia in other diseases classified elsewhere without behavioral disturbance: Secondary | ICD-10-CM | POA: Diagnosis not present

## 2021-05-31 DIAGNOSIS — M810 Age-related osteoporosis without current pathological fracture: Secondary | ICD-10-CM | POA: Diagnosis not present

## 2021-05-31 DIAGNOSIS — F411 Generalized anxiety disorder: Secondary | ICD-10-CM | POA: Diagnosis not present

## 2021-05-31 DIAGNOSIS — Z7401 Bed confinement status: Secondary | ICD-10-CM | POA: Diagnosis not present

## 2021-05-31 DIAGNOSIS — I69393 Ataxia following cerebral infarction: Secondary | ICD-10-CM | POA: Diagnosis not present

## 2021-05-31 DIAGNOSIS — L409 Psoriasis, unspecified: Secondary | ICD-10-CM | POA: Diagnosis not present

## 2021-05-31 DIAGNOSIS — G301 Alzheimer's disease with late onset: Secondary | ICD-10-CM | POA: Diagnosis not present

## 2021-05-31 DIAGNOSIS — Z87440 Personal history of urinary (tract) infections: Secondary | ICD-10-CM | POA: Diagnosis not present

## 2021-05-31 DIAGNOSIS — Z466 Encounter for fitting and adjustment of urinary device: Secondary | ICD-10-CM | POA: Diagnosis not present

## 2021-05-31 DIAGNOSIS — E7800 Pure hypercholesterolemia, unspecified: Secondary | ICD-10-CM | POA: Diagnosis not present

## 2021-05-31 DIAGNOSIS — I10 Essential (primary) hypertension: Secondary | ICD-10-CM | POA: Diagnosis not present

## 2021-05-31 DIAGNOSIS — M4800 Spinal stenosis, site unspecified: Secondary | ICD-10-CM | POA: Diagnosis not present

## 2021-06-16 ENCOUNTER — Telehealth: Payer: Self-pay

## 2021-06-16 NOTE — Telephone Encounter (Signed)
(  4:41 pm) SW returned call to patietn's husband as requested. SW recieved his voicemail and left a message requesting a call back. SW was unable to leave a message on his cell phone as it is not set up yet.

## 2021-06-23 ENCOUNTER — Telehealth: Payer: Self-pay

## 2021-06-23 DIAGNOSIS — I69393 Ataxia following cerebral infarction: Secondary | ICD-10-CM | POA: Diagnosis not present

## 2021-06-23 DIAGNOSIS — R339 Retention of urine, unspecified: Secondary | ICD-10-CM | POA: Diagnosis not present

## 2021-06-23 DIAGNOSIS — G301 Alzheimer's disease with late onset: Secondary | ICD-10-CM | POA: Diagnosis not present

## 2021-06-23 DIAGNOSIS — Z466 Encounter for fitting and adjustment of urinary device: Secondary | ICD-10-CM | POA: Diagnosis not present

## 2021-06-23 DIAGNOSIS — F0280 Dementia in other diseases classified elsewhere without behavioral disturbance: Secondary | ICD-10-CM | POA: Diagnosis not present

## 2021-06-23 DIAGNOSIS — U071 COVID-19: Secondary | ICD-10-CM | POA: Diagnosis not present

## 2021-06-23 DIAGNOSIS — I10 Essential (primary) hypertension: Secondary | ICD-10-CM | POA: Diagnosis not present

## 2021-06-23 NOTE — Telephone Encounter (Signed)
Spoke with patient's husband,Thomas, who shared that patient has been coughing about 6 out of the past 11 nights from 1am to about 5 am. Marcello Moores spoke with PCP who feels this could be caused by reflux and directed as follows: to avoid giving fluids before bed, elevate HOB and give Pepcid. Appreciative of the update and will pass along to primary Palliative care team.

## 2021-06-23 NOTE — Telephone Encounter (Signed)
Returned call to patient's husband. Unable to leave message as VM not set up.

## 2021-06-23 NOTE — Telephone Encounter (Signed)
Returned call to patient's husband. VM left.

## 2021-06-30 DIAGNOSIS — M810 Age-related osteoporosis without current pathological fracture: Secondary | ICD-10-CM | POA: Diagnosis not present

## 2021-06-30 DIAGNOSIS — Z9181 History of falling: Secondary | ICD-10-CM | POA: Diagnosis not present

## 2021-06-30 DIAGNOSIS — F0150 Vascular dementia without behavioral disturbance: Secondary | ICD-10-CM | POA: Diagnosis not present

## 2021-06-30 DIAGNOSIS — Z87891 Personal history of nicotine dependence: Secondary | ICD-10-CM | POA: Diagnosis not present

## 2021-06-30 DIAGNOSIS — F411 Generalized anxiety disorder: Secondary | ICD-10-CM | POA: Diagnosis not present

## 2021-06-30 DIAGNOSIS — Z7401 Bed confinement status: Secondary | ICD-10-CM | POA: Diagnosis not present

## 2021-06-30 DIAGNOSIS — F0280 Dementia in other diseases classified elsewhere without behavioral disturbance: Secondary | ICD-10-CM | POA: Diagnosis not present

## 2021-06-30 DIAGNOSIS — Z8616 Personal history of COVID-19: Secondary | ICD-10-CM | POA: Diagnosis not present

## 2021-06-30 DIAGNOSIS — I69318 Other symptoms and signs involving cognitive functions following cerebral infarction: Secondary | ICD-10-CM | POA: Diagnosis not present

## 2021-06-30 DIAGNOSIS — Z466 Encounter for fitting and adjustment of urinary device: Secondary | ICD-10-CM | POA: Diagnosis not present

## 2021-06-30 DIAGNOSIS — E7800 Pure hypercholesterolemia, unspecified: Secondary | ICD-10-CM | POA: Diagnosis not present

## 2021-06-30 DIAGNOSIS — I69393 Ataxia following cerebral infarction: Secondary | ICD-10-CM | POA: Diagnosis not present

## 2021-06-30 DIAGNOSIS — M4800 Spinal stenosis, site unspecified: Secondary | ICD-10-CM | POA: Diagnosis not present

## 2021-06-30 DIAGNOSIS — Z96651 Presence of right artificial knee joint: Secondary | ICD-10-CM | POA: Diagnosis not present

## 2021-06-30 DIAGNOSIS — R339 Retention of urine, unspecified: Secondary | ICD-10-CM | POA: Diagnosis not present

## 2021-06-30 DIAGNOSIS — I10 Essential (primary) hypertension: Secondary | ICD-10-CM | POA: Diagnosis not present

## 2021-06-30 DIAGNOSIS — Z87440 Personal history of urinary (tract) infections: Secondary | ICD-10-CM | POA: Diagnosis not present

## 2021-06-30 DIAGNOSIS — Z79899 Other long term (current) drug therapy: Secondary | ICD-10-CM | POA: Diagnosis not present

## 2021-06-30 DIAGNOSIS — M109 Gout, unspecified: Secondary | ICD-10-CM | POA: Diagnosis not present

## 2021-06-30 DIAGNOSIS — L409 Psoriasis, unspecified: Secondary | ICD-10-CM | POA: Diagnosis not present

## 2021-06-30 DIAGNOSIS — G301 Alzheimer's disease with late onset: Secondary | ICD-10-CM | POA: Diagnosis not present

## 2021-07-11 DIAGNOSIS — I69393 Ataxia following cerebral infarction: Secondary | ICD-10-CM | POA: Diagnosis not present

## 2021-07-11 DIAGNOSIS — F0280 Dementia in other diseases classified elsewhere without behavioral disturbance: Secondary | ICD-10-CM | POA: Diagnosis not present

## 2021-07-11 DIAGNOSIS — I10 Essential (primary) hypertension: Secondary | ICD-10-CM | POA: Diagnosis not present

## 2021-07-11 DIAGNOSIS — G301 Alzheimer's disease with late onset: Secondary | ICD-10-CM | POA: Diagnosis not present

## 2021-07-11 DIAGNOSIS — Z466 Encounter for fitting and adjustment of urinary device: Secondary | ICD-10-CM | POA: Diagnosis not present

## 2021-07-11 DIAGNOSIS — R339 Retention of urine, unspecified: Secondary | ICD-10-CM | POA: Diagnosis not present

## 2021-07-21 DIAGNOSIS — I10 Essential (primary) hypertension: Secondary | ICD-10-CM | POA: Diagnosis not present

## 2021-07-21 DIAGNOSIS — Z466 Encounter for fitting and adjustment of urinary device: Secondary | ICD-10-CM | POA: Diagnosis not present

## 2021-07-21 DIAGNOSIS — I69393 Ataxia following cerebral infarction: Secondary | ICD-10-CM | POA: Diagnosis not present

## 2021-07-21 DIAGNOSIS — G301 Alzheimer's disease with late onset: Secondary | ICD-10-CM | POA: Diagnosis not present

## 2021-07-21 DIAGNOSIS — R339 Retention of urine, unspecified: Secondary | ICD-10-CM | POA: Diagnosis not present

## 2021-07-21 DIAGNOSIS — F0280 Dementia in other diseases classified elsewhere without behavioral disturbance: Secondary | ICD-10-CM | POA: Diagnosis not present

## 2021-07-28 DIAGNOSIS — I10 Essential (primary) hypertension: Secondary | ICD-10-CM | POA: Diagnosis not present

## 2021-07-28 DIAGNOSIS — R339 Retention of urine, unspecified: Secondary | ICD-10-CM | POA: Diagnosis not present

## 2021-07-28 DIAGNOSIS — B351 Tinea unguium: Secondary | ICD-10-CM | POA: Diagnosis not present

## 2021-07-28 DIAGNOSIS — F0280 Dementia in other diseases classified elsewhere without behavioral disturbance: Secondary | ICD-10-CM | POA: Diagnosis not present

## 2021-07-28 DIAGNOSIS — Z466 Encounter for fitting and adjustment of urinary device: Secondary | ICD-10-CM | POA: Diagnosis not present

## 2021-07-28 DIAGNOSIS — I69393 Ataxia following cerebral infarction: Secondary | ICD-10-CM | POA: Diagnosis not present

## 2021-07-28 DIAGNOSIS — L03032 Cellulitis of left toe: Secondary | ICD-10-CM | POA: Diagnosis not present

## 2021-07-28 DIAGNOSIS — L02612 Cutaneous abscess of left foot: Secondary | ICD-10-CM | POA: Diagnosis not present

## 2021-07-28 DIAGNOSIS — G301 Alzheimer's disease with late onset: Secondary | ICD-10-CM | POA: Diagnosis not present

## 2021-07-30 DIAGNOSIS — M4800 Spinal stenosis, site unspecified: Secondary | ICD-10-CM | POA: Diagnosis not present

## 2021-07-30 DIAGNOSIS — Z8616 Personal history of COVID-19: Secondary | ICD-10-CM | POA: Diagnosis not present

## 2021-07-30 DIAGNOSIS — I10 Essential (primary) hypertension: Secondary | ICD-10-CM | POA: Diagnosis not present

## 2021-07-30 DIAGNOSIS — E7800 Pure hypercholesterolemia, unspecified: Secondary | ICD-10-CM | POA: Diagnosis not present

## 2021-07-30 DIAGNOSIS — F0280 Dementia in other diseases classified elsewhere without behavioral disturbance: Secondary | ICD-10-CM | POA: Diagnosis not present

## 2021-07-30 DIAGNOSIS — G301 Alzheimer's disease with late onset: Secondary | ICD-10-CM | POA: Diagnosis not present

## 2021-07-30 DIAGNOSIS — Z87440 Personal history of urinary (tract) infections: Secondary | ICD-10-CM | POA: Diagnosis not present

## 2021-07-30 DIAGNOSIS — I69318 Other symptoms and signs involving cognitive functions following cerebral infarction: Secondary | ICD-10-CM | POA: Diagnosis not present

## 2021-07-30 DIAGNOSIS — M810 Age-related osteoporosis without current pathological fracture: Secondary | ICD-10-CM | POA: Diagnosis not present

## 2021-07-30 DIAGNOSIS — R339 Retention of urine, unspecified: Secondary | ICD-10-CM | POA: Diagnosis not present

## 2021-07-30 DIAGNOSIS — Z9181 History of falling: Secondary | ICD-10-CM | POA: Diagnosis not present

## 2021-07-30 DIAGNOSIS — M109 Gout, unspecified: Secondary | ICD-10-CM | POA: Diagnosis not present

## 2021-07-30 DIAGNOSIS — I69393 Ataxia following cerebral infarction: Secondary | ICD-10-CM | POA: Diagnosis not present

## 2021-07-30 DIAGNOSIS — Z96651 Presence of right artificial knee joint: Secondary | ICD-10-CM | POA: Diagnosis not present

## 2021-07-30 DIAGNOSIS — F0150 Vascular dementia without behavioral disturbance: Secondary | ICD-10-CM | POA: Diagnosis not present

## 2021-07-30 DIAGNOSIS — Z7401 Bed confinement status: Secondary | ICD-10-CM | POA: Diagnosis not present

## 2021-07-30 DIAGNOSIS — L409 Psoriasis, unspecified: Secondary | ICD-10-CM | POA: Diagnosis not present

## 2021-07-30 DIAGNOSIS — F411 Generalized anxiety disorder: Secondary | ICD-10-CM | POA: Diagnosis not present

## 2021-07-30 DIAGNOSIS — Z466 Encounter for fitting and adjustment of urinary device: Secondary | ICD-10-CM | POA: Diagnosis not present

## 2021-07-30 DIAGNOSIS — Z79899 Other long term (current) drug therapy: Secondary | ICD-10-CM | POA: Diagnosis not present

## 2021-07-30 DIAGNOSIS — Z87891 Personal history of nicotine dependence: Secondary | ICD-10-CM | POA: Diagnosis not present

## 2021-08-18 DIAGNOSIS — R339 Retention of urine, unspecified: Secondary | ICD-10-CM | POA: Diagnosis not present

## 2021-08-18 DIAGNOSIS — Z466 Encounter for fitting and adjustment of urinary device: Secondary | ICD-10-CM | POA: Diagnosis not present

## 2021-08-18 DIAGNOSIS — G301 Alzheimer's disease with late onset: Secondary | ICD-10-CM | POA: Diagnosis not present

## 2021-08-18 DIAGNOSIS — I69393 Ataxia following cerebral infarction: Secondary | ICD-10-CM | POA: Diagnosis not present

## 2021-08-18 DIAGNOSIS — F0280 Dementia in other diseases classified elsewhere without behavioral disturbance: Secondary | ICD-10-CM | POA: Diagnosis not present

## 2021-08-18 DIAGNOSIS — I10 Essential (primary) hypertension: Secondary | ICD-10-CM | POA: Diagnosis not present

## 2021-08-29 DIAGNOSIS — M109 Gout, unspecified: Secondary | ICD-10-CM | POA: Diagnosis not present

## 2021-08-29 DIAGNOSIS — E7800 Pure hypercholesterolemia, unspecified: Secondary | ICD-10-CM | POA: Diagnosis not present

## 2021-08-29 DIAGNOSIS — L409 Psoriasis, unspecified: Secondary | ICD-10-CM | POA: Diagnosis not present

## 2021-08-29 DIAGNOSIS — M810 Age-related osteoporosis without current pathological fracture: Secondary | ICD-10-CM | POA: Diagnosis not present

## 2021-08-29 DIAGNOSIS — I10 Essential (primary) hypertension: Secondary | ICD-10-CM | POA: Diagnosis not present

## 2021-08-29 DIAGNOSIS — M4800 Spinal stenosis, site unspecified: Secondary | ICD-10-CM | POA: Diagnosis not present

## 2021-08-29 DIAGNOSIS — F0150 Vascular dementia without behavioral disturbance: Secondary | ICD-10-CM | POA: Diagnosis not present

## 2021-08-29 DIAGNOSIS — R339 Retention of urine, unspecified: Secondary | ICD-10-CM | POA: Diagnosis not present

## 2021-08-29 DIAGNOSIS — I69318 Other symptoms and signs involving cognitive functions following cerebral infarction: Secondary | ICD-10-CM | POA: Diagnosis not present

## 2021-08-29 DIAGNOSIS — I69393 Ataxia following cerebral infarction: Secondary | ICD-10-CM | POA: Diagnosis not present

## 2021-08-29 DIAGNOSIS — Z87440 Personal history of urinary (tract) infections: Secondary | ICD-10-CM | POA: Diagnosis not present

## 2021-08-29 DIAGNOSIS — F411 Generalized anxiety disorder: Secondary | ICD-10-CM | POA: Diagnosis not present

## 2021-08-29 DIAGNOSIS — Z8616 Personal history of COVID-19: Secondary | ICD-10-CM | POA: Diagnosis not present

## 2021-08-29 DIAGNOSIS — Z87891 Personal history of nicotine dependence: Secondary | ICD-10-CM | POA: Diagnosis not present

## 2021-08-29 DIAGNOSIS — G301 Alzheimer's disease with late onset: Secondary | ICD-10-CM | POA: Diagnosis not present

## 2021-08-29 DIAGNOSIS — Z9181 History of falling: Secondary | ICD-10-CM | POA: Diagnosis not present

## 2021-08-29 DIAGNOSIS — Z96651 Presence of right artificial knee joint: Secondary | ICD-10-CM | POA: Diagnosis not present

## 2021-08-29 DIAGNOSIS — F0280 Dementia in other diseases classified elsewhere without behavioral disturbance: Secondary | ICD-10-CM | POA: Diagnosis not present

## 2021-08-29 DIAGNOSIS — Z7401 Bed confinement status: Secondary | ICD-10-CM | POA: Diagnosis not present

## 2021-08-29 DIAGNOSIS — Z79899 Other long term (current) drug therapy: Secondary | ICD-10-CM | POA: Diagnosis not present

## 2021-08-29 DIAGNOSIS — Z466 Encounter for fitting and adjustment of urinary device: Secondary | ICD-10-CM | POA: Diagnosis not present

## 2021-09-15 DIAGNOSIS — I10 Essential (primary) hypertension: Secondary | ICD-10-CM | POA: Diagnosis not present

## 2021-09-15 DIAGNOSIS — I69393 Ataxia following cerebral infarction: Secondary | ICD-10-CM | POA: Diagnosis not present

## 2021-09-15 DIAGNOSIS — G301 Alzheimer's disease with late onset: Secondary | ICD-10-CM | POA: Diagnosis not present

## 2021-09-15 DIAGNOSIS — R339 Retention of urine, unspecified: Secondary | ICD-10-CM | POA: Diagnosis not present

## 2021-09-15 DIAGNOSIS — Z466 Encounter for fitting and adjustment of urinary device: Secondary | ICD-10-CM | POA: Diagnosis not present

## 2021-09-15 DIAGNOSIS — F0280 Dementia in other diseases classified elsewhere without behavioral disturbance: Secondary | ICD-10-CM | POA: Diagnosis not present

## 2021-09-24 DIAGNOSIS — Z466 Encounter for fitting and adjustment of urinary device: Secondary | ICD-10-CM | POA: Diagnosis not present

## 2021-09-24 DIAGNOSIS — R339 Retention of urine, unspecified: Secondary | ICD-10-CM | POA: Diagnosis not present

## 2021-09-24 DIAGNOSIS — I69393 Ataxia following cerebral infarction: Secondary | ICD-10-CM | POA: Diagnosis not present

## 2021-09-24 DIAGNOSIS — F0280 Dementia in other diseases classified elsewhere without behavioral disturbance: Secondary | ICD-10-CM | POA: Diagnosis not present

## 2021-09-24 DIAGNOSIS — I10 Essential (primary) hypertension: Secondary | ICD-10-CM | POA: Diagnosis not present

## 2021-09-24 DIAGNOSIS — G301 Alzheimer's disease with late onset: Secondary | ICD-10-CM | POA: Diagnosis not present

## 2021-09-28 DIAGNOSIS — M109 Gout, unspecified: Secondary | ICD-10-CM | POA: Diagnosis not present

## 2021-09-28 DIAGNOSIS — Z9181 History of falling: Secondary | ICD-10-CM | POA: Diagnosis not present

## 2021-09-28 DIAGNOSIS — Z8616 Personal history of COVID-19: Secondary | ICD-10-CM | POA: Diagnosis not present

## 2021-09-28 DIAGNOSIS — M4800 Spinal stenosis, site unspecified: Secondary | ICD-10-CM | POA: Diagnosis not present

## 2021-09-28 DIAGNOSIS — L409 Psoriasis, unspecified: Secondary | ICD-10-CM | POA: Diagnosis not present

## 2021-09-28 DIAGNOSIS — M810 Age-related osteoporosis without current pathological fracture: Secondary | ICD-10-CM | POA: Diagnosis not present

## 2021-09-28 DIAGNOSIS — I69393 Ataxia following cerebral infarction: Secondary | ICD-10-CM | POA: Diagnosis not present

## 2021-09-28 DIAGNOSIS — E7800 Pure hypercholesterolemia, unspecified: Secondary | ICD-10-CM | POA: Diagnosis not present

## 2021-09-28 DIAGNOSIS — G301 Alzheimer's disease with late onset: Secondary | ICD-10-CM | POA: Diagnosis not present

## 2021-09-28 DIAGNOSIS — Z7401 Bed confinement status: Secondary | ICD-10-CM | POA: Diagnosis not present

## 2021-09-28 DIAGNOSIS — Z96651 Presence of right artificial knee joint: Secondary | ICD-10-CM | POA: Diagnosis not present

## 2021-09-28 DIAGNOSIS — Z466 Encounter for fitting and adjustment of urinary device: Secondary | ICD-10-CM | POA: Diagnosis not present

## 2021-09-28 DIAGNOSIS — F0154 Vascular dementia, unspecified severity, with anxiety: Secondary | ICD-10-CM | POA: Diagnosis not present

## 2021-09-28 DIAGNOSIS — Z87440 Personal history of urinary (tract) infections: Secondary | ICD-10-CM | POA: Diagnosis not present

## 2021-09-28 DIAGNOSIS — R339 Retention of urine, unspecified: Secondary | ICD-10-CM | POA: Diagnosis not present

## 2021-09-28 DIAGNOSIS — I10 Essential (primary) hypertension: Secondary | ICD-10-CM | POA: Diagnosis not present

## 2021-09-28 DIAGNOSIS — F0284 Dementia in other diseases classified elsewhere, unspecified severity, with anxiety: Secondary | ICD-10-CM | POA: Diagnosis not present

## 2021-09-28 DIAGNOSIS — I69318 Other symptoms and signs involving cognitive functions following cerebral infarction: Secondary | ICD-10-CM | POA: Diagnosis not present

## 2021-09-28 DIAGNOSIS — Z8781 Personal history of (healed) traumatic fracture: Secondary | ICD-10-CM | POA: Diagnosis not present

## 2021-09-28 DIAGNOSIS — M1990 Unspecified osteoarthritis, unspecified site: Secondary | ICD-10-CM | POA: Diagnosis not present

## 2021-09-28 DIAGNOSIS — Z87891 Personal history of nicotine dependence: Secondary | ICD-10-CM | POA: Diagnosis not present

## 2021-10-13 DIAGNOSIS — I10 Essential (primary) hypertension: Secondary | ICD-10-CM | POA: Diagnosis not present

## 2021-10-13 DIAGNOSIS — L02612 Cutaneous abscess of left foot: Secondary | ICD-10-CM | POA: Diagnosis not present

## 2021-10-13 DIAGNOSIS — R339 Retention of urine, unspecified: Secondary | ICD-10-CM | POA: Diagnosis not present

## 2021-10-13 DIAGNOSIS — I69393 Ataxia following cerebral infarction: Secondary | ICD-10-CM | POA: Diagnosis not present

## 2021-10-13 DIAGNOSIS — Z466 Encounter for fitting and adjustment of urinary device: Secondary | ICD-10-CM | POA: Diagnosis not present

## 2021-10-13 DIAGNOSIS — B351 Tinea unguium: Secondary | ICD-10-CM | POA: Diagnosis not present

## 2021-10-13 DIAGNOSIS — L03032 Cellulitis of left toe: Secondary | ICD-10-CM | POA: Diagnosis not present

## 2021-10-13 DIAGNOSIS — G301 Alzheimer's disease with late onset: Secondary | ICD-10-CM | POA: Diagnosis not present

## 2021-10-13 DIAGNOSIS — F0284 Dementia in other diseases classified elsewhere, unspecified severity, with anxiety: Secondary | ICD-10-CM | POA: Diagnosis not present

## 2021-10-23 ENCOUNTER — Other Ambulatory Visit (HOSPITAL_COMMUNITY): Payer: Self-pay

## 2021-10-27 ENCOUNTER — Other Ambulatory Visit (HOSPITAL_COMMUNITY): Payer: Self-pay

## 2021-10-27 MED ORDER — ACETIC ACID 0.25 % IR SOLN
3 refills | Status: AC
  Filled 2021-10-27: qty 8000, 133d supply, fill #0
  Filled 2022-04-15: qty 12000, 90d supply, fill #1

## 2021-10-28 ENCOUNTER — Other Ambulatory Visit (HOSPITAL_COMMUNITY): Payer: Self-pay

## 2021-10-28 DIAGNOSIS — M4800 Spinal stenosis, site unspecified: Secondary | ICD-10-CM | POA: Diagnosis not present

## 2021-10-28 DIAGNOSIS — G301 Alzheimer's disease with late onset: Secondary | ICD-10-CM | POA: Diagnosis not present

## 2021-10-28 DIAGNOSIS — Z8616 Personal history of COVID-19: Secondary | ICD-10-CM | POA: Diagnosis not present

## 2021-10-28 DIAGNOSIS — I10 Essential (primary) hypertension: Secondary | ICD-10-CM | POA: Diagnosis not present

## 2021-10-28 DIAGNOSIS — Z87440 Personal history of urinary (tract) infections: Secondary | ICD-10-CM | POA: Diagnosis not present

## 2021-10-28 DIAGNOSIS — I69393 Ataxia following cerebral infarction: Secondary | ICD-10-CM | POA: Diagnosis not present

## 2021-10-28 DIAGNOSIS — Z466 Encounter for fitting and adjustment of urinary device: Secondary | ICD-10-CM | POA: Diagnosis not present

## 2021-10-28 DIAGNOSIS — F0284 Dementia in other diseases classified elsewhere, unspecified severity, with anxiety: Secondary | ICD-10-CM | POA: Diagnosis not present

## 2021-10-28 DIAGNOSIS — M1990 Unspecified osteoarthritis, unspecified site: Secondary | ICD-10-CM | POA: Diagnosis not present

## 2021-10-28 DIAGNOSIS — Z87891 Personal history of nicotine dependence: Secondary | ICD-10-CM | POA: Diagnosis not present

## 2021-10-28 DIAGNOSIS — Z9181 History of falling: Secondary | ICD-10-CM | POA: Diagnosis not present

## 2021-10-28 DIAGNOSIS — E7800 Pure hypercholesterolemia, unspecified: Secondary | ICD-10-CM | POA: Diagnosis not present

## 2021-10-28 DIAGNOSIS — M109 Gout, unspecified: Secondary | ICD-10-CM | POA: Diagnosis not present

## 2021-10-28 DIAGNOSIS — Z96651 Presence of right artificial knee joint: Secondary | ICD-10-CM | POA: Diagnosis not present

## 2021-10-28 DIAGNOSIS — I69318 Other symptoms and signs involving cognitive functions following cerebral infarction: Secondary | ICD-10-CM | POA: Diagnosis not present

## 2021-10-28 DIAGNOSIS — L409 Psoriasis, unspecified: Secondary | ICD-10-CM | POA: Diagnosis not present

## 2021-10-28 DIAGNOSIS — F0154 Vascular dementia, unspecified severity, with anxiety: Secondary | ICD-10-CM | POA: Diagnosis not present

## 2021-10-28 DIAGNOSIS — R339 Retention of urine, unspecified: Secondary | ICD-10-CM | POA: Diagnosis not present

## 2021-10-28 DIAGNOSIS — Z8781 Personal history of (healed) traumatic fracture: Secondary | ICD-10-CM | POA: Diagnosis not present

## 2021-10-28 DIAGNOSIS — Z7401 Bed confinement status: Secondary | ICD-10-CM | POA: Diagnosis not present

## 2021-10-28 DIAGNOSIS — M810 Age-related osteoporosis without current pathological fracture: Secondary | ICD-10-CM | POA: Diagnosis not present

## 2021-10-29 ENCOUNTER — Other Ambulatory Visit (HOSPITAL_COMMUNITY): Payer: Self-pay

## 2021-11-03 ENCOUNTER — Other Ambulatory Visit (HOSPITAL_COMMUNITY): Payer: Self-pay

## 2021-11-10 DIAGNOSIS — G301 Alzheimer's disease with late onset: Secondary | ICD-10-CM | POA: Diagnosis not present

## 2021-11-10 DIAGNOSIS — I69393 Ataxia following cerebral infarction: Secondary | ICD-10-CM | POA: Diagnosis not present

## 2021-11-10 DIAGNOSIS — R339 Retention of urine, unspecified: Secondary | ICD-10-CM | POA: Diagnosis not present

## 2021-11-10 DIAGNOSIS — Z466 Encounter for fitting and adjustment of urinary device: Secondary | ICD-10-CM | POA: Diagnosis not present

## 2021-11-10 DIAGNOSIS — I10 Essential (primary) hypertension: Secondary | ICD-10-CM | POA: Diagnosis not present

## 2021-11-10 DIAGNOSIS — F0284 Dementia in other diseases classified elsewhere, unspecified severity, with anxiety: Secondary | ICD-10-CM | POA: Diagnosis not present

## 2021-11-24 DIAGNOSIS — I69393 Ataxia following cerebral infarction: Secondary | ICD-10-CM | POA: Diagnosis not present

## 2021-11-24 DIAGNOSIS — F0284 Dementia in other diseases classified elsewhere, unspecified severity, with anxiety: Secondary | ICD-10-CM | POA: Diagnosis not present

## 2021-11-24 DIAGNOSIS — I10 Essential (primary) hypertension: Secondary | ICD-10-CM | POA: Diagnosis not present

## 2021-11-24 DIAGNOSIS — G301 Alzheimer's disease with late onset: Secondary | ICD-10-CM | POA: Diagnosis not present

## 2021-11-24 DIAGNOSIS — Z466 Encounter for fitting and adjustment of urinary device: Secondary | ICD-10-CM | POA: Diagnosis not present

## 2021-11-24 DIAGNOSIS — R339 Retention of urine, unspecified: Secondary | ICD-10-CM | POA: Diagnosis not present

## 2021-11-27 DIAGNOSIS — M4800 Spinal stenosis, site unspecified: Secondary | ICD-10-CM | POA: Diagnosis not present

## 2021-11-27 DIAGNOSIS — I10 Essential (primary) hypertension: Secondary | ICD-10-CM | POA: Diagnosis not present

## 2021-11-27 DIAGNOSIS — Z96651 Presence of right artificial knee joint: Secondary | ICD-10-CM | POA: Diagnosis not present

## 2021-11-27 DIAGNOSIS — I69318 Other symptoms and signs involving cognitive functions following cerebral infarction: Secondary | ICD-10-CM | POA: Diagnosis not present

## 2021-11-27 DIAGNOSIS — F0154 Vascular dementia, unspecified severity, with anxiety: Secondary | ICD-10-CM | POA: Diagnosis not present

## 2021-11-27 DIAGNOSIS — Z8616 Personal history of COVID-19: Secondary | ICD-10-CM | POA: Diagnosis not present

## 2021-11-27 DIAGNOSIS — R339 Retention of urine, unspecified: Secondary | ICD-10-CM | POA: Diagnosis not present

## 2021-11-27 DIAGNOSIS — I69393 Ataxia following cerebral infarction: Secondary | ICD-10-CM | POA: Diagnosis not present

## 2021-11-27 DIAGNOSIS — Z87891 Personal history of nicotine dependence: Secondary | ICD-10-CM | POA: Diagnosis not present

## 2021-11-27 DIAGNOSIS — M810 Age-related osteoporosis without current pathological fracture: Secondary | ICD-10-CM | POA: Diagnosis not present

## 2021-11-27 DIAGNOSIS — M1990 Unspecified osteoarthritis, unspecified site: Secondary | ICD-10-CM | POA: Diagnosis not present

## 2021-11-27 DIAGNOSIS — M109 Gout, unspecified: Secondary | ICD-10-CM | POA: Diagnosis not present

## 2021-11-27 DIAGNOSIS — F0284 Dementia in other diseases classified elsewhere, unspecified severity, with anxiety: Secondary | ICD-10-CM | POA: Diagnosis not present

## 2021-11-27 DIAGNOSIS — Z466 Encounter for fitting and adjustment of urinary device: Secondary | ICD-10-CM | POA: Diagnosis not present

## 2021-11-27 DIAGNOSIS — L409 Psoriasis, unspecified: Secondary | ICD-10-CM | POA: Diagnosis not present

## 2021-11-27 DIAGNOSIS — E7800 Pure hypercholesterolemia, unspecified: Secondary | ICD-10-CM | POA: Diagnosis not present

## 2021-11-27 DIAGNOSIS — G301 Alzheimer's disease with late onset: Secondary | ICD-10-CM | POA: Diagnosis not present

## 2021-11-27 DIAGNOSIS — Z9181 History of falling: Secondary | ICD-10-CM | POA: Diagnosis not present

## 2021-11-27 DIAGNOSIS — Z87440 Personal history of urinary (tract) infections: Secondary | ICD-10-CM | POA: Diagnosis not present

## 2021-11-27 DIAGNOSIS — Z7401 Bed confinement status: Secondary | ICD-10-CM | POA: Diagnosis not present

## 2021-11-27 DIAGNOSIS — Z8781 Personal history of (healed) traumatic fracture: Secondary | ICD-10-CM | POA: Diagnosis not present

## 2021-12-03 DIAGNOSIS — Z20822 Contact with and (suspected) exposure to covid-19: Secondary | ICD-10-CM | POA: Diagnosis not present

## 2021-12-08 DIAGNOSIS — Z466 Encounter for fitting and adjustment of urinary device: Secondary | ICD-10-CM | POA: Diagnosis not present

## 2021-12-08 DIAGNOSIS — R339 Retention of urine, unspecified: Secondary | ICD-10-CM | POA: Diagnosis not present

## 2021-12-08 DIAGNOSIS — F0284 Dementia in other diseases classified elsewhere, unspecified severity, with anxiety: Secondary | ICD-10-CM | POA: Diagnosis not present

## 2021-12-08 DIAGNOSIS — I10 Essential (primary) hypertension: Secondary | ICD-10-CM | POA: Diagnosis not present

## 2021-12-08 DIAGNOSIS — G301 Alzheimer's disease with late onset: Secondary | ICD-10-CM | POA: Diagnosis not present

## 2021-12-08 DIAGNOSIS — I69393 Ataxia following cerebral infarction: Secondary | ICD-10-CM | POA: Diagnosis not present

## 2021-12-27 DIAGNOSIS — M1990 Unspecified osteoarthritis, unspecified site: Secondary | ICD-10-CM | POA: Diagnosis not present

## 2021-12-27 DIAGNOSIS — Z8616 Personal history of COVID-19: Secondary | ICD-10-CM | POA: Diagnosis not present

## 2021-12-27 DIAGNOSIS — F0154 Vascular dementia, unspecified severity, with anxiety: Secondary | ICD-10-CM | POA: Diagnosis not present

## 2021-12-27 DIAGNOSIS — M4800 Spinal stenosis, site unspecified: Secondary | ICD-10-CM | POA: Diagnosis not present

## 2021-12-27 DIAGNOSIS — I10 Essential (primary) hypertension: Secondary | ICD-10-CM | POA: Diagnosis not present

## 2021-12-27 DIAGNOSIS — Z9181 History of falling: Secondary | ICD-10-CM | POA: Diagnosis not present

## 2021-12-27 DIAGNOSIS — L409 Psoriasis, unspecified: Secondary | ICD-10-CM | POA: Diagnosis not present

## 2021-12-27 DIAGNOSIS — G301 Alzheimer's disease with late onset: Secondary | ICD-10-CM | POA: Diagnosis not present

## 2021-12-27 DIAGNOSIS — Z87891 Personal history of nicotine dependence: Secondary | ICD-10-CM | POA: Diagnosis not present

## 2021-12-27 DIAGNOSIS — I69318 Other symptoms and signs involving cognitive functions following cerebral infarction: Secondary | ICD-10-CM | POA: Diagnosis not present

## 2021-12-27 DIAGNOSIS — Z7401 Bed confinement status: Secondary | ICD-10-CM | POA: Diagnosis not present

## 2021-12-27 DIAGNOSIS — I69393 Ataxia following cerebral infarction: Secondary | ICD-10-CM | POA: Diagnosis not present

## 2021-12-27 DIAGNOSIS — Z466 Encounter for fitting and adjustment of urinary device: Secondary | ICD-10-CM | POA: Diagnosis not present

## 2021-12-27 DIAGNOSIS — Z87440 Personal history of urinary (tract) infections: Secondary | ICD-10-CM | POA: Diagnosis not present

## 2021-12-27 DIAGNOSIS — E7800 Pure hypercholesterolemia, unspecified: Secondary | ICD-10-CM | POA: Diagnosis not present

## 2021-12-27 DIAGNOSIS — M810 Age-related osteoporosis without current pathological fracture: Secondary | ICD-10-CM | POA: Diagnosis not present

## 2021-12-27 DIAGNOSIS — R339 Retention of urine, unspecified: Secondary | ICD-10-CM | POA: Diagnosis not present

## 2021-12-27 DIAGNOSIS — M109 Gout, unspecified: Secondary | ICD-10-CM | POA: Diagnosis not present

## 2021-12-27 DIAGNOSIS — Z8781 Personal history of (healed) traumatic fracture: Secondary | ICD-10-CM | POA: Diagnosis not present

## 2021-12-27 DIAGNOSIS — F0284 Dementia in other diseases classified elsewhere, unspecified severity, with anxiety: Secondary | ICD-10-CM | POA: Diagnosis not present

## 2021-12-27 DIAGNOSIS — Z96651 Presence of right artificial knee joint: Secondary | ICD-10-CM | POA: Diagnosis not present

## 2022-01-05 DIAGNOSIS — I10 Essential (primary) hypertension: Secondary | ICD-10-CM | POA: Diagnosis not present

## 2022-01-05 DIAGNOSIS — F0284 Dementia in other diseases classified elsewhere, unspecified severity, with anxiety: Secondary | ICD-10-CM | POA: Diagnosis not present

## 2022-01-05 DIAGNOSIS — G301 Alzheimer's disease with late onset: Secondary | ICD-10-CM | POA: Diagnosis not present

## 2022-01-05 DIAGNOSIS — Z466 Encounter for fitting and adjustment of urinary device: Secondary | ICD-10-CM | POA: Diagnosis not present

## 2022-01-05 DIAGNOSIS — I69393 Ataxia following cerebral infarction: Secondary | ICD-10-CM | POA: Diagnosis not present

## 2022-01-05 DIAGNOSIS — R339 Retention of urine, unspecified: Secondary | ICD-10-CM | POA: Diagnosis not present

## 2022-01-15 DIAGNOSIS — R339 Retention of urine, unspecified: Secondary | ICD-10-CM | POA: Diagnosis not present

## 2022-01-15 DIAGNOSIS — I10 Essential (primary) hypertension: Secondary | ICD-10-CM | POA: Diagnosis not present

## 2022-01-15 DIAGNOSIS — Z466 Encounter for fitting and adjustment of urinary device: Secondary | ICD-10-CM | POA: Diagnosis not present

## 2022-01-15 DIAGNOSIS — F0284 Dementia in other diseases classified elsewhere, unspecified severity, with anxiety: Secondary | ICD-10-CM | POA: Diagnosis not present

## 2022-01-15 DIAGNOSIS — I69393 Ataxia following cerebral infarction: Secondary | ICD-10-CM | POA: Diagnosis not present

## 2022-01-15 DIAGNOSIS — G301 Alzheimer's disease with late onset: Secondary | ICD-10-CM | POA: Diagnosis not present

## 2022-01-16 DIAGNOSIS — Z20822 Contact with and (suspected) exposure to covid-19: Secondary | ICD-10-CM | POA: Diagnosis not present

## 2022-01-21 DIAGNOSIS — I69393 Ataxia following cerebral infarction: Secondary | ICD-10-CM | POA: Diagnosis not present

## 2022-01-21 DIAGNOSIS — G301 Alzheimer's disease with late onset: Secondary | ICD-10-CM | POA: Diagnosis not present

## 2022-01-21 DIAGNOSIS — F0284 Dementia in other diseases classified elsewhere, unspecified severity, with anxiety: Secondary | ICD-10-CM | POA: Diagnosis not present

## 2022-01-21 DIAGNOSIS — Z466 Encounter for fitting and adjustment of urinary device: Secondary | ICD-10-CM | POA: Diagnosis not present

## 2022-01-21 DIAGNOSIS — I10 Essential (primary) hypertension: Secondary | ICD-10-CM | POA: Diagnosis not present

## 2022-01-21 DIAGNOSIS — R339 Retention of urine, unspecified: Secondary | ICD-10-CM | POA: Diagnosis not present

## 2022-01-28 DIAGNOSIS — F419 Anxiety disorder, unspecified: Secondary | ICD-10-CM | POA: Diagnosis not present

## 2022-01-28 DIAGNOSIS — G301 Alzheimer's disease with late onset: Secondary | ICD-10-CM | POA: Diagnosis not present

## 2022-01-28 DIAGNOSIS — M810 Age-related osteoporosis without current pathological fracture: Secondary | ICD-10-CM | POA: Diagnosis not present

## 2022-01-28 DIAGNOSIS — G311 Senile degeneration of brain, not elsewhere classified: Secondary | ICD-10-CM | POA: Diagnosis not present

## 2022-01-28 DIAGNOSIS — Z515 Encounter for palliative care: Secondary | ICD-10-CM | POA: Diagnosis not present

## 2022-01-28 DIAGNOSIS — M4800 Spinal stenosis, site unspecified: Secondary | ICD-10-CM | POA: Diagnosis not present

## 2022-01-28 DIAGNOSIS — R32 Unspecified urinary incontinence: Secondary | ICD-10-CM | POA: Diagnosis not present

## 2022-01-28 DIAGNOSIS — R270 Ataxia, unspecified: Secondary | ICD-10-CM | POA: Diagnosis not present

## 2022-02-02 DIAGNOSIS — R270 Ataxia, unspecified: Secondary | ICD-10-CM | POA: Diagnosis not present

## 2022-02-02 DIAGNOSIS — R32 Unspecified urinary incontinence: Secondary | ICD-10-CM | POA: Diagnosis not present

## 2022-02-02 DIAGNOSIS — M810 Age-related osteoporosis without current pathological fracture: Secondary | ICD-10-CM | POA: Diagnosis not present

## 2022-02-02 DIAGNOSIS — M4800 Spinal stenosis, site unspecified: Secondary | ICD-10-CM | POA: Diagnosis not present

## 2022-02-02 DIAGNOSIS — G301 Alzheimer's disease with late onset: Secondary | ICD-10-CM | POA: Diagnosis not present

## 2022-02-02 DIAGNOSIS — G311 Senile degeneration of brain, not elsewhere classified: Secondary | ICD-10-CM | POA: Diagnosis not present

## 2022-02-05 DIAGNOSIS — G311 Senile degeneration of brain, not elsewhere classified: Secondary | ICD-10-CM | POA: Diagnosis not present

## 2022-02-05 DIAGNOSIS — R270 Ataxia, unspecified: Secondary | ICD-10-CM | POA: Diagnosis not present

## 2022-02-05 DIAGNOSIS — M810 Age-related osteoporosis without current pathological fracture: Secondary | ICD-10-CM | POA: Diagnosis not present

## 2022-02-05 DIAGNOSIS — M4800 Spinal stenosis, site unspecified: Secondary | ICD-10-CM | POA: Diagnosis not present

## 2022-02-05 DIAGNOSIS — G301 Alzheimer's disease with late onset: Secondary | ICD-10-CM | POA: Diagnosis not present

## 2022-02-05 DIAGNOSIS — R32 Unspecified urinary incontinence: Secondary | ICD-10-CM | POA: Diagnosis not present

## 2022-02-12 DIAGNOSIS — M810 Age-related osteoporosis without current pathological fracture: Secondary | ICD-10-CM | POA: Diagnosis not present

## 2022-02-12 DIAGNOSIS — G311 Senile degeneration of brain, not elsewhere classified: Secondary | ICD-10-CM | POA: Diagnosis not present

## 2022-02-12 DIAGNOSIS — R32 Unspecified urinary incontinence: Secondary | ICD-10-CM | POA: Diagnosis not present

## 2022-02-12 DIAGNOSIS — R270 Ataxia, unspecified: Secondary | ICD-10-CM | POA: Diagnosis not present

## 2022-02-12 DIAGNOSIS — G301 Alzheimer's disease with late onset: Secondary | ICD-10-CM | POA: Diagnosis not present

## 2022-02-12 DIAGNOSIS — M4800 Spinal stenosis, site unspecified: Secondary | ICD-10-CM | POA: Diagnosis not present

## 2022-02-16 DIAGNOSIS — M4800 Spinal stenosis, site unspecified: Secondary | ICD-10-CM | POA: Diagnosis not present

## 2022-02-16 DIAGNOSIS — G301 Alzheimer's disease with late onset: Secondary | ICD-10-CM | POA: Diagnosis not present

## 2022-02-16 DIAGNOSIS — R270 Ataxia, unspecified: Secondary | ICD-10-CM | POA: Diagnosis not present

## 2022-02-16 DIAGNOSIS — M810 Age-related osteoporosis without current pathological fracture: Secondary | ICD-10-CM | POA: Diagnosis not present

## 2022-02-16 DIAGNOSIS — G311 Senile degeneration of brain, not elsewhere classified: Secondary | ICD-10-CM | POA: Diagnosis not present

## 2022-02-16 DIAGNOSIS — R32 Unspecified urinary incontinence: Secondary | ICD-10-CM | POA: Diagnosis not present

## 2022-02-18 DIAGNOSIS — M810 Age-related osteoporosis without current pathological fracture: Secondary | ICD-10-CM | POA: Diagnosis not present

## 2022-02-18 DIAGNOSIS — G311 Senile degeneration of brain, not elsewhere classified: Secondary | ICD-10-CM | POA: Diagnosis not present

## 2022-02-18 DIAGNOSIS — G301 Alzheimer's disease with late onset: Secondary | ICD-10-CM | POA: Diagnosis not present

## 2022-02-18 DIAGNOSIS — R270 Ataxia, unspecified: Secondary | ICD-10-CM | POA: Diagnosis not present

## 2022-02-18 DIAGNOSIS — M4800 Spinal stenosis, site unspecified: Secondary | ICD-10-CM | POA: Diagnosis not present

## 2022-02-18 DIAGNOSIS — R32 Unspecified urinary incontinence: Secondary | ICD-10-CM | POA: Diagnosis not present

## 2022-02-22 DIAGNOSIS — R270 Ataxia, unspecified: Secondary | ICD-10-CM | POA: Diagnosis not present

## 2022-02-22 DIAGNOSIS — M4800 Spinal stenosis, site unspecified: Secondary | ICD-10-CM | POA: Diagnosis not present

## 2022-02-22 DIAGNOSIS — R32 Unspecified urinary incontinence: Secondary | ICD-10-CM | POA: Diagnosis not present

## 2022-02-22 DIAGNOSIS — F419 Anxiety disorder, unspecified: Secondary | ICD-10-CM | POA: Diagnosis not present

## 2022-02-22 DIAGNOSIS — G311 Senile degeneration of brain, not elsewhere classified: Secondary | ICD-10-CM | POA: Diagnosis not present

## 2022-02-22 DIAGNOSIS — Z515 Encounter for palliative care: Secondary | ICD-10-CM | POA: Diagnosis not present

## 2022-02-22 DIAGNOSIS — G301 Alzheimer's disease with late onset: Secondary | ICD-10-CM | POA: Diagnosis not present

## 2022-02-22 DIAGNOSIS — M810 Age-related osteoporosis without current pathological fracture: Secondary | ICD-10-CM | POA: Diagnosis not present

## 2022-02-23 DIAGNOSIS — G311 Senile degeneration of brain, not elsewhere classified: Secondary | ICD-10-CM | POA: Diagnosis not present

## 2022-02-23 DIAGNOSIS — R32 Unspecified urinary incontinence: Secondary | ICD-10-CM | POA: Diagnosis not present

## 2022-02-23 DIAGNOSIS — G301 Alzheimer's disease with late onset: Secondary | ICD-10-CM | POA: Diagnosis not present

## 2022-02-23 DIAGNOSIS — R270 Ataxia, unspecified: Secondary | ICD-10-CM | POA: Diagnosis not present

## 2022-02-23 DIAGNOSIS — M4800 Spinal stenosis, site unspecified: Secondary | ICD-10-CM | POA: Diagnosis not present

## 2022-02-23 DIAGNOSIS — M810 Age-related osteoporosis without current pathological fracture: Secondary | ICD-10-CM | POA: Diagnosis not present

## 2022-02-25 DIAGNOSIS — M810 Age-related osteoporosis without current pathological fracture: Secondary | ICD-10-CM | POA: Diagnosis not present

## 2022-02-25 DIAGNOSIS — G311 Senile degeneration of brain, not elsewhere classified: Secondary | ICD-10-CM | POA: Diagnosis not present

## 2022-02-25 DIAGNOSIS — R270 Ataxia, unspecified: Secondary | ICD-10-CM | POA: Diagnosis not present

## 2022-02-25 DIAGNOSIS — M4800 Spinal stenosis, site unspecified: Secondary | ICD-10-CM | POA: Diagnosis not present

## 2022-02-25 DIAGNOSIS — R32 Unspecified urinary incontinence: Secondary | ICD-10-CM | POA: Diagnosis not present

## 2022-02-25 DIAGNOSIS — G301 Alzheimer's disease with late onset: Secondary | ICD-10-CM | POA: Diagnosis not present

## 2022-03-02 DIAGNOSIS — R32 Unspecified urinary incontinence: Secondary | ICD-10-CM | POA: Diagnosis not present

## 2022-03-02 DIAGNOSIS — M4800 Spinal stenosis, site unspecified: Secondary | ICD-10-CM | POA: Diagnosis not present

## 2022-03-02 DIAGNOSIS — G311 Senile degeneration of brain, not elsewhere classified: Secondary | ICD-10-CM | POA: Diagnosis not present

## 2022-03-02 DIAGNOSIS — R270 Ataxia, unspecified: Secondary | ICD-10-CM | POA: Diagnosis not present

## 2022-03-02 DIAGNOSIS — G301 Alzheimer's disease with late onset: Secondary | ICD-10-CM | POA: Diagnosis not present

## 2022-03-02 DIAGNOSIS — M810 Age-related osteoporosis without current pathological fracture: Secondary | ICD-10-CM | POA: Diagnosis not present

## 2022-03-04 DIAGNOSIS — G311 Senile degeneration of brain, not elsewhere classified: Secondary | ICD-10-CM | POA: Diagnosis not present

## 2022-03-04 DIAGNOSIS — G301 Alzheimer's disease with late onset: Secondary | ICD-10-CM | POA: Diagnosis not present

## 2022-03-04 DIAGNOSIS — R270 Ataxia, unspecified: Secondary | ICD-10-CM | POA: Diagnosis not present

## 2022-03-04 DIAGNOSIS — R32 Unspecified urinary incontinence: Secondary | ICD-10-CM | POA: Diagnosis not present

## 2022-03-04 DIAGNOSIS — M810 Age-related osteoporosis without current pathological fracture: Secondary | ICD-10-CM | POA: Diagnosis not present

## 2022-03-04 DIAGNOSIS — M4800 Spinal stenosis, site unspecified: Secondary | ICD-10-CM | POA: Diagnosis not present

## 2022-03-05 DIAGNOSIS — G301 Alzheimer's disease with late onset: Secondary | ICD-10-CM | POA: Diagnosis not present

## 2022-03-05 DIAGNOSIS — R32 Unspecified urinary incontinence: Secondary | ICD-10-CM | POA: Diagnosis not present

## 2022-03-05 DIAGNOSIS — G311 Senile degeneration of brain, not elsewhere classified: Secondary | ICD-10-CM | POA: Diagnosis not present

## 2022-03-05 DIAGNOSIS — M810 Age-related osteoporosis without current pathological fracture: Secondary | ICD-10-CM | POA: Diagnosis not present

## 2022-03-05 DIAGNOSIS — R270 Ataxia, unspecified: Secondary | ICD-10-CM | POA: Diagnosis not present

## 2022-03-05 DIAGNOSIS — M4800 Spinal stenosis, site unspecified: Secondary | ICD-10-CM | POA: Diagnosis not present

## 2022-03-09 DIAGNOSIS — G311 Senile degeneration of brain, not elsewhere classified: Secondary | ICD-10-CM | POA: Diagnosis not present

## 2022-03-09 DIAGNOSIS — M4800 Spinal stenosis, site unspecified: Secondary | ICD-10-CM | POA: Diagnosis not present

## 2022-03-09 DIAGNOSIS — M810 Age-related osteoporosis without current pathological fracture: Secondary | ICD-10-CM | POA: Diagnosis not present

## 2022-03-09 DIAGNOSIS — R270 Ataxia, unspecified: Secondary | ICD-10-CM | POA: Diagnosis not present

## 2022-03-09 DIAGNOSIS — G301 Alzheimer's disease with late onset: Secondary | ICD-10-CM | POA: Diagnosis not present

## 2022-03-09 DIAGNOSIS — R32 Unspecified urinary incontinence: Secondary | ICD-10-CM | POA: Diagnosis not present

## 2022-03-11 DIAGNOSIS — G301 Alzheimer's disease with late onset: Secondary | ICD-10-CM | POA: Diagnosis not present

## 2022-03-11 DIAGNOSIS — G311 Senile degeneration of brain, not elsewhere classified: Secondary | ICD-10-CM | POA: Diagnosis not present

## 2022-03-11 DIAGNOSIS — M4800 Spinal stenosis, site unspecified: Secondary | ICD-10-CM | POA: Diagnosis not present

## 2022-03-11 DIAGNOSIS — M810 Age-related osteoporosis without current pathological fracture: Secondary | ICD-10-CM | POA: Diagnosis not present

## 2022-03-11 DIAGNOSIS — R32 Unspecified urinary incontinence: Secondary | ICD-10-CM | POA: Diagnosis not present

## 2022-03-11 DIAGNOSIS — R270 Ataxia, unspecified: Secondary | ICD-10-CM | POA: Diagnosis not present

## 2022-03-16 DIAGNOSIS — G311 Senile degeneration of brain, not elsewhere classified: Secondary | ICD-10-CM | POA: Diagnosis not present

## 2022-03-16 DIAGNOSIS — M810 Age-related osteoporosis without current pathological fracture: Secondary | ICD-10-CM | POA: Diagnosis not present

## 2022-03-16 DIAGNOSIS — R32 Unspecified urinary incontinence: Secondary | ICD-10-CM | POA: Diagnosis not present

## 2022-03-16 DIAGNOSIS — R270 Ataxia, unspecified: Secondary | ICD-10-CM | POA: Diagnosis not present

## 2022-03-16 DIAGNOSIS — M4800 Spinal stenosis, site unspecified: Secondary | ICD-10-CM | POA: Diagnosis not present

## 2022-03-16 DIAGNOSIS — G301 Alzheimer's disease with late onset: Secondary | ICD-10-CM | POA: Diagnosis not present

## 2022-03-18 DIAGNOSIS — R32 Unspecified urinary incontinence: Secondary | ICD-10-CM | POA: Diagnosis not present

## 2022-03-18 DIAGNOSIS — G301 Alzheimer's disease with late onset: Secondary | ICD-10-CM | POA: Diagnosis not present

## 2022-03-18 DIAGNOSIS — M810 Age-related osteoporosis without current pathological fracture: Secondary | ICD-10-CM | POA: Diagnosis not present

## 2022-03-18 DIAGNOSIS — G311 Senile degeneration of brain, not elsewhere classified: Secondary | ICD-10-CM | POA: Diagnosis not present

## 2022-03-18 DIAGNOSIS — R270 Ataxia, unspecified: Secondary | ICD-10-CM | POA: Diagnosis not present

## 2022-03-18 DIAGNOSIS — M4800 Spinal stenosis, site unspecified: Secondary | ICD-10-CM | POA: Diagnosis not present

## 2022-03-23 DIAGNOSIS — G311 Senile degeneration of brain, not elsewhere classified: Secondary | ICD-10-CM | POA: Diagnosis not present

## 2022-03-23 DIAGNOSIS — R32 Unspecified urinary incontinence: Secondary | ICD-10-CM | POA: Diagnosis not present

## 2022-03-23 DIAGNOSIS — G301 Alzheimer's disease with late onset: Secondary | ICD-10-CM | POA: Diagnosis not present

## 2022-03-23 DIAGNOSIS — R270 Ataxia, unspecified: Secondary | ICD-10-CM | POA: Diagnosis not present

## 2022-03-23 DIAGNOSIS — M4800 Spinal stenosis, site unspecified: Secondary | ICD-10-CM | POA: Diagnosis not present

## 2022-03-23 DIAGNOSIS — M810 Age-related osteoporosis without current pathological fracture: Secondary | ICD-10-CM | POA: Diagnosis not present

## 2022-03-25 DIAGNOSIS — M4800 Spinal stenosis, site unspecified: Secondary | ICD-10-CM | POA: Diagnosis not present

## 2022-03-25 DIAGNOSIS — R32 Unspecified urinary incontinence: Secondary | ICD-10-CM | POA: Diagnosis not present

## 2022-03-25 DIAGNOSIS — Z515 Encounter for palliative care: Secondary | ICD-10-CM | POA: Diagnosis not present

## 2022-03-25 DIAGNOSIS — R270 Ataxia, unspecified: Secondary | ICD-10-CM | POA: Diagnosis not present

## 2022-03-25 DIAGNOSIS — F419 Anxiety disorder, unspecified: Secondary | ICD-10-CM | POA: Diagnosis not present

## 2022-03-25 DIAGNOSIS — G311 Senile degeneration of brain, not elsewhere classified: Secondary | ICD-10-CM | POA: Diagnosis not present

## 2022-03-25 DIAGNOSIS — M810 Age-related osteoporosis without current pathological fracture: Secondary | ICD-10-CM | POA: Diagnosis not present

## 2022-03-25 DIAGNOSIS — G301 Alzheimer's disease with late onset: Secondary | ICD-10-CM | POA: Diagnosis not present

## 2022-03-29 DIAGNOSIS — G301 Alzheimer's disease with late onset: Secondary | ICD-10-CM | POA: Diagnosis not present

## 2022-03-29 DIAGNOSIS — R32 Unspecified urinary incontinence: Secondary | ICD-10-CM | POA: Diagnosis not present

## 2022-03-29 DIAGNOSIS — R270 Ataxia, unspecified: Secondary | ICD-10-CM | POA: Diagnosis not present

## 2022-03-29 DIAGNOSIS — G311 Senile degeneration of brain, not elsewhere classified: Secondary | ICD-10-CM | POA: Diagnosis not present

## 2022-03-29 DIAGNOSIS — M810 Age-related osteoporosis without current pathological fracture: Secondary | ICD-10-CM | POA: Diagnosis not present

## 2022-03-29 DIAGNOSIS — M4800 Spinal stenosis, site unspecified: Secondary | ICD-10-CM | POA: Diagnosis not present

## 2022-03-30 DIAGNOSIS — M4800 Spinal stenosis, site unspecified: Secondary | ICD-10-CM | POA: Diagnosis not present

## 2022-03-30 DIAGNOSIS — G311 Senile degeneration of brain, not elsewhere classified: Secondary | ICD-10-CM | POA: Diagnosis not present

## 2022-03-30 DIAGNOSIS — G301 Alzheimer's disease with late onset: Secondary | ICD-10-CM | POA: Diagnosis not present

## 2022-03-30 DIAGNOSIS — M810 Age-related osteoporosis without current pathological fracture: Secondary | ICD-10-CM | POA: Diagnosis not present

## 2022-03-30 DIAGNOSIS — R270 Ataxia, unspecified: Secondary | ICD-10-CM | POA: Diagnosis not present

## 2022-03-30 DIAGNOSIS — R32 Unspecified urinary incontinence: Secondary | ICD-10-CM | POA: Diagnosis not present

## 2022-04-01 DIAGNOSIS — R32 Unspecified urinary incontinence: Secondary | ICD-10-CM | POA: Diagnosis not present

## 2022-04-01 DIAGNOSIS — M4800 Spinal stenosis, site unspecified: Secondary | ICD-10-CM | POA: Diagnosis not present

## 2022-04-01 DIAGNOSIS — G301 Alzheimer's disease with late onset: Secondary | ICD-10-CM | POA: Diagnosis not present

## 2022-04-01 DIAGNOSIS — M810 Age-related osteoporosis without current pathological fracture: Secondary | ICD-10-CM | POA: Diagnosis not present

## 2022-04-01 DIAGNOSIS — G311 Senile degeneration of brain, not elsewhere classified: Secondary | ICD-10-CM | POA: Diagnosis not present

## 2022-04-01 DIAGNOSIS — R270 Ataxia, unspecified: Secondary | ICD-10-CM | POA: Diagnosis not present

## 2022-04-06 DIAGNOSIS — G301 Alzheimer's disease with late onset: Secondary | ICD-10-CM | POA: Diagnosis not present

## 2022-04-06 DIAGNOSIS — M810 Age-related osteoporosis without current pathological fracture: Secondary | ICD-10-CM | POA: Diagnosis not present

## 2022-04-06 DIAGNOSIS — R32 Unspecified urinary incontinence: Secondary | ICD-10-CM | POA: Diagnosis not present

## 2022-04-06 DIAGNOSIS — G311 Senile degeneration of brain, not elsewhere classified: Secondary | ICD-10-CM | POA: Diagnosis not present

## 2022-04-06 DIAGNOSIS — R270 Ataxia, unspecified: Secondary | ICD-10-CM | POA: Diagnosis not present

## 2022-04-06 DIAGNOSIS — M4800 Spinal stenosis, site unspecified: Secondary | ICD-10-CM | POA: Diagnosis not present

## 2022-04-08 DIAGNOSIS — G311 Senile degeneration of brain, not elsewhere classified: Secondary | ICD-10-CM | POA: Diagnosis not present

## 2022-04-08 DIAGNOSIS — M810 Age-related osteoporosis without current pathological fracture: Secondary | ICD-10-CM | POA: Diagnosis not present

## 2022-04-08 DIAGNOSIS — G301 Alzheimer's disease with late onset: Secondary | ICD-10-CM | POA: Diagnosis not present

## 2022-04-08 DIAGNOSIS — R32 Unspecified urinary incontinence: Secondary | ICD-10-CM | POA: Diagnosis not present

## 2022-04-08 DIAGNOSIS — M4800 Spinal stenosis, site unspecified: Secondary | ICD-10-CM | POA: Diagnosis not present

## 2022-04-08 DIAGNOSIS — R270 Ataxia, unspecified: Secondary | ICD-10-CM | POA: Diagnosis not present

## 2022-04-09 DIAGNOSIS — G301 Alzheimer's disease with late onset: Secondary | ICD-10-CM | POA: Diagnosis not present

## 2022-04-09 DIAGNOSIS — M4800 Spinal stenosis, site unspecified: Secondary | ICD-10-CM | POA: Diagnosis not present

## 2022-04-09 DIAGNOSIS — M810 Age-related osteoporosis without current pathological fracture: Secondary | ICD-10-CM | POA: Diagnosis not present

## 2022-04-09 DIAGNOSIS — G311 Senile degeneration of brain, not elsewhere classified: Secondary | ICD-10-CM | POA: Diagnosis not present

## 2022-04-09 DIAGNOSIS — R32 Unspecified urinary incontinence: Secondary | ICD-10-CM | POA: Diagnosis not present

## 2022-04-09 DIAGNOSIS — R270 Ataxia, unspecified: Secondary | ICD-10-CM | POA: Diagnosis not present

## 2022-04-13 DIAGNOSIS — M810 Age-related osteoporosis without current pathological fracture: Secondary | ICD-10-CM | POA: Diagnosis not present

## 2022-04-13 DIAGNOSIS — G301 Alzheimer's disease with late onset: Secondary | ICD-10-CM | POA: Diagnosis not present

## 2022-04-13 DIAGNOSIS — G311 Senile degeneration of brain, not elsewhere classified: Secondary | ICD-10-CM | POA: Diagnosis not present

## 2022-04-13 DIAGNOSIS — M4800 Spinal stenosis, site unspecified: Secondary | ICD-10-CM | POA: Diagnosis not present

## 2022-04-13 DIAGNOSIS — R270 Ataxia, unspecified: Secondary | ICD-10-CM | POA: Diagnosis not present

## 2022-04-13 DIAGNOSIS — R32 Unspecified urinary incontinence: Secondary | ICD-10-CM | POA: Diagnosis not present

## 2022-04-15 ENCOUNTER — Other Ambulatory Visit (HOSPITAL_COMMUNITY): Payer: Self-pay

## 2022-04-15 DIAGNOSIS — R270 Ataxia, unspecified: Secondary | ICD-10-CM | POA: Diagnosis not present

## 2022-04-15 DIAGNOSIS — R32 Unspecified urinary incontinence: Secondary | ICD-10-CM | POA: Diagnosis not present

## 2022-04-15 DIAGNOSIS — M4800 Spinal stenosis, site unspecified: Secondary | ICD-10-CM | POA: Diagnosis not present

## 2022-04-15 DIAGNOSIS — G311 Senile degeneration of brain, not elsewhere classified: Secondary | ICD-10-CM | POA: Diagnosis not present

## 2022-04-15 DIAGNOSIS — M810 Age-related osteoporosis without current pathological fracture: Secondary | ICD-10-CM | POA: Diagnosis not present

## 2022-04-15 DIAGNOSIS — G301 Alzheimer's disease with late onset: Secondary | ICD-10-CM | POA: Diagnosis not present

## 2022-04-16 ENCOUNTER — Other Ambulatory Visit (HOSPITAL_COMMUNITY): Payer: Self-pay

## 2022-04-19 DIAGNOSIS — R270 Ataxia, unspecified: Secondary | ICD-10-CM | POA: Diagnosis not present

## 2022-04-19 DIAGNOSIS — G301 Alzheimer's disease with late onset: Secondary | ICD-10-CM | POA: Diagnosis not present

## 2022-04-19 DIAGNOSIS — R32 Unspecified urinary incontinence: Secondary | ICD-10-CM | POA: Diagnosis not present

## 2022-04-19 DIAGNOSIS — G311 Senile degeneration of brain, not elsewhere classified: Secondary | ICD-10-CM | POA: Diagnosis not present

## 2022-04-19 DIAGNOSIS — M810 Age-related osteoporosis without current pathological fracture: Secondary | ICD-10-CM | POA: Diagnosis not present

## 2022-04-19 DIAGNOSIS — M4800 Spinal stenosis, site unspecified: Secondary | ICD-10-CM | POA: Diagnosis not present

## 2022-04-20 DIAGNOSIS — M4800 Spinal stenosis, site unspecified: Secondary | ICD-10-CM | POA: Diagnosis not present

## 2022-04-20 DIAGNOSIS — G301 Alzheimer's disease with late onset: Secondary | ICD-10-CM | POA: Diagnosis not present

## 2022-04-20 DIAGNOSIS — G311 Senile degeneration of brain, not elsewhere classified: Secondary | ICD-10-CM | POA: Diagnosis not present

## 2022-04-20 DIAGNOSIS — R32 Unspecified urinary incontinence: Secondary | ICD-10-CM | POA: Diagnosis not present

## 2022-04-20 DIAGNOSIS — M810 Age-related osteoporosis without current pathological fracture: Secondary | ICD-10-CM | POA: Diagnosis not present

## 2022-04-20 DIAGNOSIS — R270 Ataxia, unspecified: Secondary | ICD-10-CM | POA: Diagnosis not present

## 2022-04-22 DIAGNOSIS — R32 Unspecified urinary incontinence: Secondary | ICD-10-CM | POA: Diagnosis not present

## 2022-04-22 DIAGNOSIS — M4800 Spinal stenosis, site unspecified: Secondary | ICD-10-CM | POA: Diagnosis not present

## 2022-04-22 DIAGNOSIS — G301 Alzheimer's disease with late onset: Secondary | ICD-10-CM | POA: Diagnosis not present

## 2022-04-22 DIAGNOSIS — R270 Ataxia, unspecified: Secondary | ICD-10-CM | POA: Diagnosis not present

## 2022-04-22 DIAGNOSIS — G311 Senile degeneration of brain, not elsewhere classified: Secondary | ICD-10-CM | POA: Diagnosis not present

## 2022-04-22 DIAGNOSIS — M810 Age-related osteoporosis without current pathological fracture: Secondary | ICD-10-CM | POA: Diagnosis not present

## 2022-04-24 DIAGNOSIS — Z515 Encounter for palliative care: Secondary | ICD-10-CM | POA: Diagnosis not present

## 2022-04-24 DIAGNOSIS — M4800 Spinal stenosis, site unspecified: Secondary | ICD-10-CM | POA: Diagnosis not present

## 2022-04-24 DIAGNOSIS — R32 Unspecified urinary incontinence: Secondary | ICD-10-CM | POA: Diagnosis not present

## 2022-04-24 DIAGNOSIS — G301 Alzheimer's disease with late onset: Secondary | ICD-10-CM | POA: Diagnosis not present

## 2022-04-24 DIAGNOSIS — F419 Anxiety disorder, unspecified: Secondary | ICD-10-CM | POA: Diagnosis not present

## 2022-04-24 DIAGNOSIS — M810 Age-related osteoporosis without current pathological fracture: Secondary | ICD-10-CM | POA: Diagnosis not present

## 2022-04-24 DIAGNOSIS — R270 Ataxia, unspecified: Secondary | ICD-10-CM | POA: Diagnosis not present

## 2022-04-24 DIAGNOSIS — G311 Senile degeneration of brain, not elsewhere classified: Secondary | ICD-10-CM | POA: Diagnosis not present

## 2022-04-26 DIAGNOSIS — R270 Ataxia, unspecified: Secondary | ICD-10-CM | POA: Diagnosis not present

## 2022-04-26 DIAGNOSIS — G311 Senile degeneration of brain, not elsewhere classified: Secondary | ICD-10-CM | POA: Diagnosis not present

## 2022-04-26 DIAGNOSIS — G301 Alzheimer's disease with late onset: Secondary | ICD-10-CM | POA: Diagnosis not present

## 2022-04-26 DIAGNOSIS — M810 Age-related osteoporosis without current pathological fracture: Secondary | ICD-10-CM | POA: Diagnosis not present

## 2022-04-26 DIAGNOSIS — R32 Unspecified urinary incontinence: Secondary | ICD-10-CM | POA: Diagnosis not present

## 2022-04-26 DIAGNOSIS — M4800 Spinal stenosis, site unspecified: Secondary | ICD-10-CM | POA: Diagnosis not present

## 2022-04-29 DIAGNOSIS — M810 Age-related osteoporosis without current pathological fracture: Secondary | ICD-10-CM | POA: Diagnosis not present

## 2022-04-29 DIAGNOSIS — G311 Senile degeneration of brain, not elsewhere classified: Secondary | ICD-10-CM | POA: Diagnosis not present

## 2022-04-29 DIAGNOSIS — G301 Alzheimer's disease with late onset: Secondary | ICD-10-CM | POA: Diagnosis not present

## 2022-04-29 DIAGNOSIS — R270 Ataxia, unspecified: Secondary | ICD-10-CM | POA: Diagnosis not present

## 2022-04-29 DIAGNOSIS — M4800 Spinal stenosis, site unspecified: Secondary | ICD-10-CM | POA: Diagnosis not present

## 2022-04-29 DIAGNOSIS — R32 Unspecified urinary incontinence: Secondary | ICD-10-CM | POA: Diagnosis not present

## 2022-05-03 DIAGNOSIS — R270 Ataxia, unspecified: Secondary | ICD-10-CM | POA: Diagnosis not present

## 2022-05-03 DIAGNOSIS — R32 Unspecified urinary incontinence: Secondary | ICD-10-CM | POA: Diagnosis not present

## 2022-05-03 DIAGNOSIS — G301 Alzheimer's disease with late onset: Secondary | ICD-10-CM | POA: Diagnosis not present

## 2022-05-03 DIAGNOSIS — M810 Age-related osteoporosis without current pathological fracture: Secondary | ICD-10-CM | POA: Diagnosis not present

## 2022-05-03 DIAGNOSIS — G311 Senile degeneration of brain, not elsewhere classified: Secondary | ICD-10-CM | POA: Diagnosis not present

## 2022-05-03 DIAGNOSIS — M4800 Spinal stenosis, site unspecified: Secondary | ICD-10-CM | POA: Diagnosis not present

## 2022-05-04 DIAGNOSIS — R32 Unspecified urinary incontinence: Secondary | ICD-10-CM | POA: Diagnosis not present

## 2022-05-04 DIAGNOSIS — G301 Alzheimer's disease with late onset: Secondary | ICD-10-CM | POA: Diagnosis not present

## 2022-05-04 DIAGNOSIS — R270 Ataxia, unspecified: Secondary | ICD-10-CM | POA: Diagnosis not present

## 2022-05-04 DIAGNOSIS — G311 Senile degeneration of brain, not elsewhere classified: Secondary | ICD-10-CM | POA: Diagnosis not present

## 2022-05-04 DIAGNOSIS — M810 Age-related osteoporosis without current pathological fracture: Secondary | ICD-10-CM | POA: Diagnosis not present

## 2022-05-04 DIAGNOSIS — M4800 Spinal stenosis, site unspecified: Secondary | ICD-10-CM | POA: Diagnosis not present

## 2022-05-06 DIAGNOSIS — R32 Unspecified urinary incontinence: Secondary | ICD-10-CM | POA: Diagnosis not present

## 2022-05-06 DIAGNOSIS — R270 Ataxia, unspecified: Secondary | ICD-10-CM | POA: Diagnosis not present

## 2022-05-06 DIAGNOSIS — G301 Alzheimer's disease with late onset: Secondary | ICD-10-CM | POA: Diagnosis not present

## 2022-05-06 DIAGNOSIS — G311 Senile degeneration of brain, not elsewhere classified: Secondary | ICD-10-CM | POA: Diagnosis not present

## 2022-05-06 DIAGNOSIS — M810 Age-related osteoporosis without current pathological fracture: Secondary | ICD-10-CM | POA: Diagnosis not present

## 2022-05-06 DIAGNOSIS — M4800 Spinal stenosis, site unspecified: Secondary | ICD-10-CM | POA: Diagnosis not present

## 2022-05-10 DIAGNOSIS — G301 Alzheimer's disease with late onset: Secondary | ICD-10-CM | POA: Diagnosis not present

## 2022-05-10 DIAGNOSIS — G311 Senile degeneration of brain, not elsewhere classified: Secondary | ICD-10-CM | POA: Diagnosis not present

## 2022-05-10 DIAGNOSIS — M4800 Spinal stenosis, site unspecified: Secondary | ICD-10-CM | POA: Diagnosis not present

## 2022-05-10 DIAGNOSIS — R32 Unspecified urinary incontinence: Secondary | ICD-10-CM | POA: Diagnosis not present

## 2022-05-10 DIAGNOSIS — M810 Age-related osteoporosis without current pathological fracture: Secondary | ICD-10-CM | POA: Diagnosis not present

## 2022-05-10 DIAGNOSIS — R270 Ataxia, unspecified: Secondary | ICD-10-CM | POA: Diagnosis not present

## 2022-05-11 DIAGNOSIS — M4800 Spinal stenosis, site unspecified: Secondary | ICD-10-CM | POA: Diagnosis not present

## 2022-05-11 DIAGNOSIS — M810 Age-related osteoporosis without current pathological fracture: Secondary | ICD-10-CM | POA: Diagnosis not present

## 2022-05-11 DIAGNOSIS — R270 Ataxia, unspecified: Secondary | ICD-10-CM | POA: Diagnosis not present

## 2022-05-11 DIAGNOSIS — G311 Senile degeneration of brain, not elsewhere classified: Secondary | ICD-10-CM | POA: Diagnosis not present

## 2022-05-11 DIAGNOSIS — R32 Unspecified urinary incontinence: Secondary | ICD-10-CM | POA: Diagnosis not present

## 2022-05-11 DIAGNOSIS — G301 Alzheimer's disease with late onset: Secondary | ICD-10-CM | POA: Diagnosis not present

## 2022-05-13 DIAGNOSIS — R32 Unspecified urinary incontinence: Secondary | ICD-10-CM | POA: Diagnosis not present

## 2022-05-13 DIAGNOSIS — R270 Ataxia, unspecified: Secondary | ICD-10-CM | POA: Diagnosis not present

## 2022-05-13 DIAGNOSIS — M810 Age-related osteoporosis without current pathological fracture: Secondary | ICD-10-CM | POA: Diagnosis not present

## 2022-05-13 DIAGNOSIS — G301 Alzheimer's disease with late onset: Secondary | ICD-10-CM | POA: Diagnosis not present

## 2022-05-13 DIAGNOSIS — G311 Senile degeneration of brain, not elsewhere classified: Secondary | ICD-10-CM | POA: Diagnosis not present

## 2022-05-13 DIAGNOSIS — M4800 Spinal stenosis, site unspecified: Secondary | ICD-10-CM | POA: Diagnosis not present

## 2022-05-18 DIAGNOSIS — M810 Age-related osteoporosis without current pathological fracture: Secondary | ICD-10-CM | POA: Diagnosis not present

## 2022-05-18 DIAGNOSIS — R32 Unspecified urinary incontinence: Secondary | ICD-10-CM | POA: Diagnosis not present

## 2022-05-18 DIAGNOSIS — G311 Senile degeneration of brain, not elsewhere classified: Secondary | ICD-10-CM | POA: Diagnosis not present

## 2022-05-18 DIAGNOSIS — R270 Ataxia, unspecified: Secondary | ICD-10-CM | POA: Diagnosis not present

## 2022-05-18 DIAGNOSIS — G301 Alzheimer's disease with late onset: Secondary | ICD-10-CM | POA: Diagnosis not present

## 2022-05-18 DIAGNOSIS — M4800 Spinal stenosis, site unspecified: Secondary | ICD-10-CM | POA: Diagnosis not present

## 2022-05-20 DIAGNOSIS — G311 Senile degeneration of brain, not elsewhere classified: Secondary | ICD-10-CM | POA: Diagnosis not present

## 2022-05-20 DIAGNOSIS — M4800 Spinal stenosis, site unspecified: Secondary | ICD-10-CM | POA: Diagnosis not present

## 2022-05-20 DIAGNOSIS — M810 Age-related osteoporosis without current pathological fracture: Secondary | ICD-10-CM | POA: Diagnosis not present

## 2022-05-20 DIAGNOSIS — R32 Unspecified urinary incontinence: Secondary | ICD-10-CM | POA: Diagnosis not present

## 2022-05-20 DIAGNOSIS — R270 Ataxia, unspecified: Secondary | ICD-10-CM | POA: Diagnosis not present

## 2022-05-20 DIAGNOSIS — G301 Alzheimer's disease with late onset: Secondary | ICD-10-CM | POA: Diagnosis not present

## 2022-05-25 DIAGNOSIS — R32 Unspecified urinary incontinence: Secondary | ICD-10-CM | POA: Diagnosis not present

## 2022-05-25 DIAGNOSIS — G301 Alzheimer's disease with late onset: Secondary | ICD-10-CM | POA: Diagnosis not present

## 2022-05-25 DIAGNOSIS — R270 Ataxia, unspecified: Secondary | ICD-10-CM | POA: Diagnosis not present

## 2022-05-25 DIAGNOSIS — M4800 Spinal stenosis, site unspecified: Secondary | ICD-10-CM | POA: Diagnosis not present

## 2022-05-25 DIAGNOSIS — G311 Senile degeneration of brain, not elsewhere classified: Secondary | ICD-10-CM | POA: Diagnosis not present

## 2022-05-25 DIAGNOSIS — Z515 Encounter for palliative care: Secondary | ICD-10-CM | POA: Diagnosis not present

## 2022-05-25 DIAGNOSIS — M810 Age-related osteoporosis without current pathological fracture: Secondary | ICD-10-CM | POA: Diagnosis not present

## 2022-05-25 DIAGNOSIS — F419 Anxiety disorder, unspecified: Secondary | ICD-10-CM | POA: Diagnosis not present

## 2022-05-27 DIAGNOSIS — M4800 Spinal stenosis, site unspecified: Secondary | ICD-10-CM | POA: Diagnosis not present

## 2022-05-27 DIAGNOSIS — R270 Ataxia, unspecified: Secondary | ICD-10-CM | POA: Diagnosis not present

## 2022-05-27 DIAGNOSIS — G301 Alzheimer's disease with late onset: Secondary | ICD-10-CM | POA: Diagnosis not present

## 2022-05-27 DIAGNOSIS — R32 Unspecified urinary incontinence: Secondary | ICD-10-CM | POA: Diagnosis not present

## 2022-05-27 DIAGNOSIS — G311 Senile degeneration of brain, not elsewhere classified: Secondary | ICD-10-CM | POA: Diagnosis not present

## 2022-05-27 DIAGNOSIS — M810 Age-related osteoporosis without current pathological fracture: Secondary | ICD-10-CM | POA: Diagnosis not present

## 2022-06-01 DIAGNOSIS — G311 Senile degeneration of brain, not elsewhere classified: Secondary | ICD-10-CM | POA: Diagnosis not present

## 2022-06-01 DIAGNOSIS — R32 Unspecified urinary incontinence: Secondary | ICD-10-CM | POA: Diagnosis not present

## 2022-06-01 DIAGNOSIS — M810 Age-related osteoporosis without current pathological fracture: Secondary | ICD-10-CM | POA: Diagnosis not present

## 2022-06-01 DIAGNOSIS — M4800 Spinal stenosis, site unspecified: Secondary | ICD-10-CM | POA: Diagnosis not present

## 2022-06-01 DIAGNOSIS — G301 Alzheimer's disease with late onset: Secondary | ICD-10-CM | POA: Diagnosis not present

## 2022-06-01 DIAGNOSIS — R270 Ataxia, unspecified: Secondary | ICD-10-CM | POA: Diagnosis not present

## 2022-06-02 DIAGNOSIS — G301 Alzheimer's disease with late onset: Secondary | ICD-10-CM | POA: Diagnosis not present

## 2022-06-02 DIAGNOSIS — R32 Unspecified urinary incontinence: Secondary | ICD-10-CM | POA: Diagnosis not present

## 2022-06-02 DIAGNOSIS — M810 Age-related osteoporosis without current pathological fracture: Secondary | ICD-10-CM | POA: Diagnosis not present

## 2022-06-02 DIAGNOSIS — R270 Ataxia, unspecified: Secondary | ICD-10-CM | POA: Diagnosis not present

## 2022-06-02 DIAGNOSIS — G311 Senile degeneration of brain, not elsewhere classified: Secondary | ICD-10-CM | POA: Diagnosis not present

## 2022-06-02 DIAGNOSIS — M4800 Spinal stenosis, site unspecified: Secondary | ICD-10-CM | POA: Diagnosis not present

## 2022-06-03 DIAGNOSIS — M4800 Spinal stenosis, site unspecified: Secondary | ICD-10-CM | POA: Diagnosis not present

## 2022-06-03 DIAGNOSIS — G301 Alzheimer's disease with late onset: Secondary | ICD-10-CM | POA: Diagnosis not present

## 2022-06-03 DIAGNOSIS — M810 Age-related osteoporosis without current pathological fracture: Secondary | ICD-10-CM | POA: Diagnosis not present

## 2022-06-03 DIAGNOSIS — R32 Unspecified urinary incontinence: Secondary | ICD-10-CM | POA: Diagnosis not present

## 2022-06-03 DIAGNOSIS — G311 Senile degeneration of brain, not elsewhere classified: Secondary | ICD-10-CM | POA: Diagnosis not present

## 2022-06-03 DIAGNOSIS — R270 Ataxia, unspecified: Secondary | ICD-10-CM | POA: Diagnosis not present

## 2022-06-08 DIAGNOSIS — R270 Ataxia, unspecified: Secondary | ICD-10-CM | POA: Diagnosis not present

## 2022-06-08 DIAGNOSIS — M4800 Spinal stenosis, site unspecified: Secondary | ICD-10-CM | POA: Diagnosis not present

## 2022-06-08 DIAGNOSIS — M810 Age-related osteoporosis without current pathological fracture: Secondary | ICD-10-CM | POA: Diagnosis not present

## 2022-06-08 DIAGNOSIS — R32 Unspecified urinary incontinence: Secondary | ICD-10-CM | POA: Diagnosis not present

## 2022-06-08 DIAGNOSIS — G301 Alzheimer's disease with late onset: Secondary | ICD-10-CM | POA: Diagnosis not present

## 2022-06-08 DIAGNOSIS — G311 Senile degeneration of brain, not elsewhere classified: Secondary | ICD-10-CM | POA: Diagnosis not present

## 2022-06-09 DIAGNOSIS — R32 Unspecified urinary incontinence: Secondary | ICD-10-CM | POA: Diagnosis not present

## 2022-06-09 DIAGNOSIS — M810 Age-related osteoporosis without current pathological fracture: Secondary | ICD-10-CM | POA: Diagnosis not present

## 2022-06-09 DIAGNOSIS — G301 Alzheimer's disease with late onset: Secondary | ICD-10-CM | POA: Diagnosis not present

## 2022-06-09 DIAGNOSIS — R270 Ataxia, unspecified: Secondary | ICD-10-CM | POA: Diagnosis not present

## 2022-06-09 DIAGNOSIS — G311 Senile degeneration of brain, not elsewhere classified: Secondary | ICD-10-CM | POA: Diagnosis not present

## 2022-06-09 DIAGNOSIS — M4800 Spinal stenosis, site unspecified: Secondary | ICD-10-CM | POA: Diagnosis not present

## 2022-06-10 DIAGNOSIS — M4800 Spinal stenosis, site unspecified: Secondary | ICD-10-CM | POA: Diagnosis not present

## 2022-06-10 DIAGNOSIS — R32 Unspecified urinary incontinence: Secondary | ICD-10-CM | POA: Diagnosis not present

## 2022-06-10 DIAGNOSIS — M810 Age-related osteoporosis without current pathological fracture: Secondary | ICD-10-CM | POA: Diagnosis not present

## 2022-06-10 DIAGNOSIS — R270 Ataxia, unspecified: Secondary | ICD-10-CM | POA: Diagnosis not present

## 2022-06-10 DIAGNOSIS — G301 Alzheimer's disease with late onset: Secondary | ICD-10-CM | POA: Diagnosis not present

## 2022-06-10 DIAGNOSIS — G311 Senile degeneration of brain, not elsewhere classified: Secondary | ICD-10-CM | POA: Diagnosis not present

## 2022-06-15 DIAGNOSIS — M810 Age-related osteoporosis without current pathological fracture: Secondary | ICD-10-CM | POA: Diagnosis not present

## 2022-06-15 DIAGNOSIS — R32 Unspecified urinary incontinence: Secondary | ICD-10-CM | POA: Diagnosis not present

## 2022-06-15 DIAGNOSIS — G301 Alzheimer's disease with late onset: Secondary | ICD-10-CM | POA: Diagnosis not present

## 2022-06-15 DIAGNOSIS — G311 Senile degeneration of brain, not elsewhere classified: Secondary | ICD-10-CM | POA: Diagnosis not present

## 2022-06-15 DIAGNOSIS — R270 Ataxia, unspecified: Secondary | ICD-10-CM | POA: Diagnosis not present

## 2022-06-15 DIAGNOSIS — M4800 Spinal stenosis, site unspecified: Secondary | ICD-10-CM | POA: Diagnosis not present

## 2022-06-16 DIAGNOSIS — G301 Alzheimer's disease with late onset: Secondary | ICD-10-CM | POA: Diagnosis not present

## 2022-06-16 DIAGNOSIS — G311 Senile degeneration of brain, not elsewhere classified: Secondary | ICD-10-CM | POA: Diagnosis not present

## 2022-06-16 DIAGNOSIS — R32 Unspecified urinary incontinence: Secondary | ICD-10-CM | POA: Diagnosis not present

## 2022-06-16 DIAGNOSIS — R270 Ataxia, unspecified: Secondary | ICD-10-CM | POA: Diagnosis not present

## 2022-06-16 DIAGNOSIS — M810 Age-related osteoporosis without current pathological fracture: Secondary | ICD-10-CM | POA: Diagnosis not present

## 2022-06-16 DIAGNOSIS — M4800 Spinal stenosis, site unspecified: Secondary | ICD-10-CM | POA: Diagnosis not present

## 2022-06-17 DIAGNOSIS — M810 Age-related osteoporosis without current pathological fracture: Secondary | ICD-10-CM | POA: Diagnosis not present

## 2022-06-17 DIAGNOSIS — M4800 Spinal stenosis, site unspecified: Secondary | ICD-10-CM | POA: Diagnosis not present

## 2022-06-17 DIAGNOSIS — R32 Unspecified urinary incontinence: Secondary | ICD-10-CM | POA: Diagnosis not present

## 2022-06-17 DIAGNOSIS — G301 Alzheimer's disease with late onset: Secondary | ICD-10-CM | POA: Diagnosis not present

## 2022-06-17 DIAGNOSIS — R270 Ataxia, unspecified: Secondary | ICD-10-CM | POA: Diagnosis not present

## 2022-06-17 DIAGNOSIS — G311 Senile degeneration of brain, not elsewhere classified: Secondary | ICD-10-CM | POA: Diagnosis not present

## 2022-06-22 DIAGNOSIS — R32 Unspecified urinary incontinence: Secondary | ICD-10-CM | POA: Diagnosis not present

## 2022-06-22 DIAGNOSIS — G311 Senile degeneration of brain, not elsewhere classified: Secondary | ICD-10-CM | POA: Diagnosis not present

## 2022-06-22 DIAGNOSIS — M810 Age-related osteoporosis without current pathological fracture: Secondary | ICD-10-CM | POA: Diagnosis not present

## 2022-06-22 DIAGNOSIS — R270 Ataxia, unspecified: Secondary | ICD-10-CM | POA: Diagnosis not present

## 2022-06-22 DIAGNOSIS — G301 Alzheimer's disease with late onset: Secondary | ICD-10-CM | POA: Diagnosis not present

## 2022-06-22 DIAGNOSIS — M4800 Spinal stenosis, site unspecified: Secondary | ICD-10-CM | POA: Diagnosis not present

## 2022-06-24 DIAGNOSIS — R32 Unspecified urinary incontinence: Secondary | ICD-10-CM | POA: Diagnosis not present

## 2022-06-24 DIAGNOSIS — G311 Senile degeneration of brain, not elsewhere classified: Secondary | ICD-10-CM | POA: Diagnosis not present

## 2022-06-24 DIAGNOSIS — M810 Age-related osteoporosis without current pathological fracture: Secondary | ICD-10-CM | POA: Diagnosis not present

## 2022-06-24 DIAGNOSIS — G301 Alzheimer's disease with late onset: Secondary | ICD-10-CM | POA: Diagnosis not present

## 2022-06-24 DIAGNOSIS — R270 Ataxia, unspecified: Secondary | ICD-10-CM | POA: Diagnosis not present

## 2022-06-24 DIAGNOSIS — M4800 Spinal stenosis, site unspecified: Secondary | ICD-10-CM | POA: Diagnosis not present

## 2022-07-15 ENCOUNTER — Telehealth: Payer: Self-pay

## 2022-07-22 ENCOUNTER — Ambulatory Visit: Payer: Self-pay

## 2022-07-22 NOTE — Patient Outreach (Signed)
  Care Coordination   07/22/2022 Name: Tammy Arnold MRN: [id number] DOB: 1941/05/15   Care Coordination Outreach Attempts: An unsuccessful telephone outreach was attempted today.   Follow Up Plan:  Will attempt to reach [name]'s caregiver/spouse within the next week.  Encounter Outcome:  No Answer  Care Coordination Interventions Activated:  No   Care Coordination Interventions:  No, not indicated    [facility] [phone]

## 2022-07-23 ENCOUNTER — Ambulatory Visit: Payer: Self-pay

## 2022-11-05 DIAGNOSIS — L02612 Cutaneous abscess of left foot: Secondary | ICD-10-CM | POA: Diagnosis not present

## 2022-11-05 DIAGNOSIS — B351 Tinea unguium: Secondary | ICD-10-CM | POA: Diagnosis not present

## 2022-11-05 DIAGNOSIS — L02611 Cutaneous abscess of right foot: Secondary | ICD-10-CM | POA: Diagnosis not present

## 2022-11-05 DIAGNOSIS — L03031 Cellulitis of right toe: Secondary | ICD-10-CM | POA: Diagnosis not present

## 2022-11-05 DIAGNOSIS — L03032 Cellulitis of left toe: Secondary | ICD-10-CM | POA: Diagnosis not present

## 2022-12-03 DIAGNOSIS — Z7401 Bed confinement status: Secondary | ICD-10-CM | POA: Diagnosis not present

## 2022-12-03 DIAGNOSIS — I69993 Ataxia following unspecified cerebrovascular disease: Secondary | ICD-10-CM | POA: Diagnosis not present

## 2022-12-03 DIAGNOSIS — G301 Alzheimer's disease with late onset: Secondary | ICD-10-CM | POA: Diagnosis not present

## 2022-12-03 DIAGNOSIS — R32 Unspecified urinary incontinence: Secondary | ICD-10-CM | POA: Diagnosis not present

## 2022-12-08 DIAGNOSIS — I69393 Ataxia following cerebral infarction: Secondary | ICD-10-CM | POA: Diagnosis not present

## 2022-12-08 DIAGNOSIS — Z466 Encounter for fitting and adjustment of urinary device: Secondary | ICD-10-CM | POA: Diagnosis not present

## 2022-12-08 DIAGNOSIS — M810 Age-related osteoporosis without current pathological fracture: Secondary | ICD-10-CM | POA: Diagnosis not present

## 2022-12-08 DIAGNOSIS — G301 Alzheimer's disease with late onset: Secondary | ICD-10-CM | POA: Diagnosis not present

## 2022-12-08 DIAGNOSIS — I10 Essential (primary) hypertension: Secondary | ICD-10-CM | POA: Diagnosis not present

## 2022-12-08 DIAGNOSIS — R32 Unspecified urinary incontinence: Secondary | ICD-10-CM | POA: Diagnosis not present

## 2022-12-08 DIAGNOSIS — F0150 Vascular dementia without behavioral disturbance: Secondary | ICD-10-CM | POA: Diagnosis not present

## 2022-12-08 DIAGNOSIS — H518 Other specified disorders of binocular movement: Secondary | ICD-10-CM | POA: Diagnosis not present

## 2022-12-22 DIAGNOSIS — Z466 Encounter for fitting and adjustment of urinary device: Secondary | ICD-10-CM | POA: Diagnosis not present

## 2022-12-22 DIAGNOSIS — G301 Alzheimer's disease with late onset: Secondary | ICD-10-CM | POA: Diagnosis not present

## 2022-12-22 DIAGNOSIS — R32 Unspecified urinary incontinence: Secondary | ICD-10-CM | POA: Diagnosis not present

## 2022-12-22 DIAGNOSIS — I69393 Ataxia following cerebral infarction: Secondary | ICD-10-CM | POA: Diagnosis not present

## 2022-12-22 DIAGNOSIS — F0150 Vascular dementia without behavioral disturbance: Secondary | ICD-10-CM | POA: Diagnosis not present

## 2022-12-22 DIAGNOSIS — M810 Age-related osteoporosis without current pathological fracture: Secondary | ICD-10-CM | POA: Diagnosis not present

## 2022-12-22 DIAGNOSIS — H518 Other specified disorders of binocular movement: Secondary | ICD-10-CM | POA: Diagnosis not present

## 2022-12-22 DIAGNOSIS — I10 Essential (primary) hypertension: Secondary | ICD-10-CM | POA: Diagnosis not present

## 2023-01-05 DIAGNOSIS — I69393 Ataxia following cerebral infarction: Secondary | ICD-10-CM | POA: Diagnosis not present

## 2023-01-05 DIAGNOSIS — R32 Unspecified urinary incontinence: Secondary | ICD-10-CM | POA: Diagnosis not present

## 2023-01-05 DIAGNOSIS — G301 Alzheimer's disease with late onset: Secondary | ICD-10-CM | POA: Diagnosis not present

## 2023-01-05 DIAGNOSIS — M810 Age-related osteoporosis without current pathological fracture: Secondary | ICD-10-CM | POA: Diagnosis not present

## 2023-01-05 DIAGNOSIS — H518 Other specified disorders of binocular movement: Secondary | ICD-10-CM | POA: Diagnosis not present

## 2023-01-05 DIAGNOSIS — I10 Essential (primary) hypertension: Secondary | ICD-10-CM | POA: Diagnosis not present

## 2023-01-05 DIAGNOSIS — F0150 Vascular dementia without behavioral disturbance: Secondary | ICD-10-CM | POA: Diagnosis not present

## 2023-01-05 DIAGNOSIS — Z466 Encounter for fitting and adjustment of urinary device: Secondary | ICD-10-CM | POA: Diagnosis not present

## 2023-01-21 DIAGNOSIS — R32 Unspecified urinary incontinence: Secondary | ICD-10-CM | POA: Diagnosis not present

## 2023-01-21 DIAGNOSIS — I10 Essential (primary) hypertension: Secondary | ICD-10-CM | POA: Diagnosis not present

## 2023-01-21 DIAGNOSIS — F0150 Vascular dementia without behavioral disturbance: Secondary | ICD-10-CM | POA: Diagnosis not present

## 2023-01-21 DIAGNOSIS — H518 Other specified disorders of binocular movement: Secondary | ICD-10-CM | POA: Diagnosis not present

## 2023-01-21 DIAGNOSIS — Z466 Encounter for fitting and adjustment of urinary device: Secondary | ICD-10-CM | POA: Diagnosis not present

## 2023-01-21 DIAGNOSIS — G301 Alzheimer's disease with late onset: Secondary | ICD-10-CM | POA: Diagnosis not present

## 2023-01-21 DIAGNOSIS — M810 Age-related osteoporosis without current pathological fracture: Secondary | ICD-10-CM | POA: Diagnosis not present

## 2023-01-21 DIAGNOSIS — I69393 Ataxia following cerebral infarction: Secondary | ICD-10-CM | POA: Diagnosis not present

## 2023-02-01 DIAGNOSIS — H518 Other specified disorders of binocular movement: Secondary | ICD-10-CM | POA: Diagnosis not present

## 2023-02-01 DIAGNOSIS — I69393 Ataxia following cerebral infarction: Secondary | ICD-10-CM | POA: Diagnosis not present

## 2023-02-01 DIAGNOSIS — M810 Age-related osteoporosis without current pathological fracture: Secondary | ICD-10-CM | POA: Diagnosis not present

## 2023-02-01 DIAGNOSIS — F0150 Vascular dementia without behavioral disturbance: Secondary | ICD-10-CM | POA: Diagnosis not present

## 2023-02-01 DIAGNOSIS — R32 Unspecified urinary incontinence: Secondary | ICD-10-CM | POA: Diagnosis not present

## 2023-02-01 DIAGNOSIS — I10 Essential (primary) hypertension: Secondary | ICD-10-CM | POA: Diagnosis not present

## 2023-02-01 DIAGNOSIS — Z466 Encounter for fitting and adjustment of urinary device: Secondary | ICD-10-CM | POA: Diagnosis not present

## 2023-02-01 DIAGNOSIS — G301 Alzheimer's disease with late onset: Secondary | ICD-10-CM | POA: Diagnosis not present

## 2023-02-02 ENCOUNTER — Other Ambulatory Visit (HOSPITAL_COMMUNITY): Payer: Self-pay

## 2023-02-04 ENCOUNTER — Other Ambulatory Visit (HOSPITAL_COMMUNITY): Payer: Self-pay

## 2023-02-04 ENCOUNTER — Other Ambulatory Visit: Payer: Self-pay | Admitting: Family Medicine

## 2023-02-06 DIAGNOSIS — Z466 Encounter for fitting and adjustment of urinary device: Secondary | ICD-10-CM | POA: Diagnosis not present

## 2023-02-06 DIAGNOSIS — G301 Alzheimer's disease with late onset: Secondary | ICD-10-CM | POA: Diagnosis not present

## 2023-02-06 DIAGNOSIS — M810 Age-related osteoporosis without current pathological fracture: Secondary | ICD-10-CM | POA: Diagnosis not present

## 2023-02-06 DIAGNOSIS — I69393 Ataxia following cerebral infarction: Secondary | ICD-10-CM | POA: Diagnosis not present

## 2023-02-06 DIAGNOSIS — R32 Unspecified urinary incontinence: Secondary | ICD-10-CM | POA: Diagnosis not present

## 2023-02-06 DIAGNOSIS — F0150 Vascular dementia without behavioral disturbance: Secondary | ICD-10-CM | POA: Diagnosis not present

## 2023-02-06 DIAGNOSIS — H518 Other specified disorders of binocular movement: Secondary | ICD-10-CM | POA: Diagnosis not present

## 2023-02-06 DIAGNOSIS — I10 Essential (primary) hypertension: Secondary | ICD-10-CM | POA: Diagnosis not present

## 2023-02-10 ENCOUNTER — Other Ambulatory Visit (HOSPITAL_COMMUNITY): Payer: Self-pay

## 2023-02-10 MED ORDER — ACETIC ACID 0.25 % IR SOLN
3 refills | Status: AC
  Filled 2023-02-10: qty 5000, 90d supply, fill #0
  Filled 2023-07-01: qty 5000, 90d supply, fill #1

## 2023-02-11 ENCOUNTER — Other Ambulatory Visit (HOSPITAL_COMMUNITY): Payer: Self-pay

## 2023-02-14 ENCOUNTER — Other Ambulatory Visit (HOSPITAL_COMMUNITY): Payer: Self-pay

## 2023-02-15 DIAGNOSIS — G301 Alzheimer's disease with late onset: Secondary | ICD-10-CM | POA: Diagnosis not present

## 2023-02-15 DIAGNOSIS — I69393 Ataxia following cerebral infarction: Secondary | ICD-10-CM | POA: Diagnosis not present

## 2023-02-15 DIAGNOSIS — F0150 Vascular dementia without behavioral disturbance: Secondary | ICD-10-CM | POA: Diagnosis not present

## 2023-02-15 DIAGNOSIS — R32 Unspecified urinary incontinence: Secondary | ICD-10-CM | POA: Diagnosis not present

## 2023-02-15 DIAGNOSIS — H518 Other specified disorders of binocular movement: Secondary | ICD-10-CM | POA: Diagnosis not present

## 2023-02-15 DIAGNOSIS — I10 Essential (primary) hypertension: Secondary | ICD-10-CM | POA: Diagnosis not present

## 2023-02-15 DIAGNOSIS — Z466 Encounter for fitting and adjustment of urinary device: Secondary | ICD-10-CM | POA: Diagnosis not present

## 2023-02-15 DIAGNOSIS — M810 Age-related osteoporosis without current pathological fracture: Secondary | ICD-10-CM | POA: Diagnosis not present

## 2023-03-02 DIAGNOSIS — H518 Other specified disorders of binocular movement: Secondary | ICD-10-CM | POA: Diagnosis not present

## 2023-03-02 DIAGNOSIS — Z466 Encounter for fitting and adjustment of urinary device: Secondary | ICD-10-CM | POA: Diagnosis not present

## 2023-03-02 DIAGNOSIS — M810 Age-related osteoporosis without current pathological fracture: Secondary | ICD-10-CM | POA: Diagnosis not present

## 2023-03-02 DIAGNOSIS — I69393 Ataxia following cerebral infarction: Secondary | ICD-10-CM | POA: Diagnosis not present

## 2023-03-02 DIAGNOSIS — R32 Unspecified urinary incontinence: Secondary | ICD-10-CM | POA: Diagnosis not present

## 2023-03-02 DIAGNOSIS — F0150 Vascular dementia without behavioral disturbance: Secondary | ICD-10-CM | POA: Diagnosis not present

## 2023-03-02 DIAGNOSIS — I10 Essential (primary) hypertension: Secondary | ICD-10-CM | POA: Diagnosis not present

## 2023-03-02 DIAGNOSIS — G301 Alzheimer's disease with late onset: Secondary | ICD-10-CM | POA: Diagnosis not present

## 2023-03-03 DIAGNOSIS — N39498 Other specified urinary incontinence: Secondary | ICD-10-CM | POA: Diagnosis not present

## 2023-03-03 DIAGNOSIS — L89892 Pressure ulcer of other site, stage 2: Secondary | ICD-10-CM | POA: Diagnosis not present

## 2023-03-16 DIAGNOSIS — M810 Age-related osteoporosis without current pathological fracture: Secondary | ICD-10-CM | POA: Diagnosis not present

## 2023-03-16 DIAGNOSIS — R32 Unspecified urinary incontinence: Secondary | ICD-10-CM | POA: Diagnosis not present

## 2023-03-16 DIAGNOSIS — H518 Other specified disorders of binocular movement: Secondary | ICD-10-CM | POA: Diagnosis not present

## 2023-03-16 DIAGNOSIS — I10 Essential (primary) hypertension: Secondary | ICD-10-CM | POA: Diagnosis not present

## 2023-03-16 DIAGNOSIS — G301 Alzheimer's disease with late onset: Secondary | ICD-10-CM | POA: Diagnosis not present

## 2023-03-16 DIAGNOSIS — F0150 Vascular dementia without behavioral disturbance: Secondary | ICD-10-CM | POA: Diagnosis not present

## 2023-03-16 DIAGNOSIS — Z466 Encounter for fitting and adjustment of urinary device: Secondary | ICD-10-CM | POA: Diagnosis not present

## 2023-03-16 DIAGNOSIS — I69393 Ataxia following cerebral infarction: Secondary | ICD-10-CM | POA: Diagnosis not present

## 2023-03-30 DIAGNOSIS — I10 Essential (primary) hypertension: Secondary | ICD-10-CM | POA: Diagnosis not present

## 2023-03-30 DIAGNOSIS — F0150 Vascular dementia without behavioral disturbance: Secondary | ICD-10-CM | POA: Diagnosis not present

## 2023-03-30 DIAGNOSIS — R32 Unspecified urinary incontinence: Secondary | ICD-10-CM | POA: Diagnosis not present

## 2023-03-30 DIAGNOSIS — Z466 Encounter for fitting and adjustment of urinary device: Secondary | ICD-10-CM | POA: Diagnosis not present

## 2023-03-30 DIAGNOSIS — M810 Age-related osteoporosis without current pathological fracture: Secondary | ICD-10-CM | POA: Diagnosis not present

## 2023-03-30 DIAGNOSIS — G301 Alzheimer's disease with late onset: Secondary | ICD-10-CM | POA: Diagnosis not present

## 2023-03-30 DIAGNOSIS — H518 Other specified disorders of binocular movement: Secondary | ICD-10-CM | POA: Diagnosis not present

## 2023-03-30 DIAGNOSIS — I69393 Ataxia following cerebral infarction: Secondary | ICD-10-CM | POA: Diagnosis not present

## 2023-04-06 DIAGNOSIS — I10 Essential (primary) hypertension: Secondary | ICD-10-CM | POA: Diagnosis not present

## 2023-04-06 DIAGNOSIS — M810 Age-related osteoporosis without current pathological fracture: Secondary | ICD-10-CM | POA: Diagnosis not present

## 2023-04-06 DIAGNOSIS — H518 Other specified disorders of binocular movement: Secondary | ICD-10-CM | POA: Diagnosis not present

## 2023-04-06 DIAGNOSIS — F0150 Vascular dementia without behavioral disturbance: Secondary | ICD-10-CM | POA: Diagnosis not present

## 2023-04-06 DIAGNOSIS — Z466 Encounter for fitting and adjustment of urinary device: Secondary | ICD-10-CM | POA: Diagnosis not present

## 2023-04-06 DIAGNOSIS — I69393 Ataxia following cerebral infarction: Secondary | ICD-10-CM | POA: Diagnosis not present

## 2023-04-06 DIAGNOSIS — R32 Unspecified urinary incontinence: Secondary | ICD-10-CM | POA: Diagnosis not present

## 2023-04-06 DIAGNOSIS — G301 Alzheimer's disease with late onset: Secondary | ICD-10-CM | POA: Diagnosis not present

## 2023-04-07 DIAGNOSIS — F0150 Vascular dementia without behavioral disturbance: Secondary | ICD-10-CM | POA: Diagnosis not present

## 2023-04-07 DIAGNOSIS — I69393 Ataxia following cerebral infarction: Secondary | ICD-10-CM | POA: Diagnosis not present

## 2023-04-07 DIAGNOSIS — G301 Alzheimer's disease with late onset: Secondary | ICD-10-CM | POA: Diagnosis not present

## 2023-04-07 DIAGNOSIS — I10 Essential (primary) hypertension: Secondary | ICD-10-CM | POA: Diagnosis not present

## 2023-04-07 DIAGNOSIS — M810 Age-related osteoporosis without current pathological fracture: Secondary | ICD-10-CM | POA: Diagnosis not present

## 2023-04-07 DIAGNOSIS — R32 Unspecified urinary incontinence: Secondary | ICD-10-CM | POA: Diagnosis not present

## 2023-04-07 DIAGNOSIS — L89152 Pressure ulcer of sacral region, stage 2: Secondary | ICD-10-CM | POA: Diagnosis not present

## 2023-04-07 DIAGNOSIS — H518 Other specified disorders of binocular movement: Secondary | ICD-10-CM | POA: Diagnosis not present

## 2023-04-07 DIAGNOSIS — Z466 Encounter for fitting and adjustment of urinary device: Secondary | ICD-10-CM | POA: Diagnosis not present

## 2023-04-12 DIAGNOSIS — L89892 Pressure ulcer of other site, stage 2: Secondary | ICD-10-CM | POA: Diagnosis not present

## 2023-04-12 DIAGNOSIS — N39498 Other specified urinary incontinence: Secondary | ICD-10-CM | POA: Diagnosis not present

## 2023-04-14 DIAGNOSIS — G301 Alzheimer's disease with late onset: Secondary | ICD-10-CM | POA: Diagnosis not present

## 2023-04-14 DIAGNOSIS — F0150 Vascular dementia without behavioral disturbance: Secondary | ICD-10-CM | POA: Diagnosis not present

## 2023-04-14 DIAGNOSIS — R32 Unspecified urinary incontinence: Secondary | ICD-10-CM | POA: Diagnosis not present

## 2023-04-14 DIAGNOSIS — Z466 Encounter for fitting and adjustment of urinary device: Secondary | ICD-10-CM | POA: Diagnosis not present

## 2023-04-14 DIAGNOSIS — M810 Age-related osteoporosis without current pathological fracture: Secondary | ICD-10-CM | POA: Diagnosis not present

## 2023-04-14 DIAGNOSIS — I69393 Ataxia following cerebral infarction: Secondary | ICD-10-CM | POA: Diagnosis not present

## 2023-04-14 DIAGNOSIS — H518 Other specified disorders of binocular movement: Secondary | ICD-10-CM | POA: Diagnosis not present

## 2023-04-14 DIAGNOSIS — I10 Essential (primary) hypertension: Secondary | ICD-10-CM | POA: Diagnosis not present

## 2023-04-14 DIAGNOSIS — L89152 Pressure ulcer of sacral region, stage 2: Secondary | ICD-10-CM | POA: Diagnosis not present

## 2023-04-22 DIAGNOSIS — Z466 Encounter for fitting and adjustment of urinary device: Secondary | ICD-10-CM | POA: Diagnosis not present

## 2023-04-22 DIAGNOSIS — I10 Essential (primary) hypertension: Secondary | ICD-10-CM | POA: Diagnosis not present

## 2023-04-22 DIAGNOSIS — M810 Age-related osteoporosis without current pathological fracture: Secondary | ICD-10-CM | POA: Diagnosis not present

## 2023-04-22 DIAGNOSIS — L89152 Pressure ulcer of sacral region, stage 2: Secondary | ICD-10-CM | POA: Diagnosis not present

## 2023-04-22 DIAGNOSIS — R32 Unspecified urinary incontinence: Secondary | ICD-10-CM | POA: Diagnosis not present

## 2023-04-22 DIAGNOSIS — I69393 Ataxia following cerebral infarction: Secondary | ICD-10-CM | POA: Diagnosis not present

## 2023-04-22 DIAGNOSIS — F0150 Vascular dementia without behavioral disturbance: Secondary | ICD-10-CM | POA: Diagnosis not present

## 2023-04-22 DIAGNOSIS — H518 Other specified disorders of binocular movement: Secondary | ICD-10-CM | POA: Diagnosis not present

## 2023-04-22 DIAGNOSIS — G301 Alzheimer's disease with late onset: Secondary | ICD-10-CM | POA: Diagnosis not present

## 2023-05-04 DIAGNOSIS — L89152 Pressure ulcer of sacral region, stage 2: Secondary | ICD-10-CM | POA: Diagnosis not present

## 2023-05-04 DIAGNOSIS — H518 Other specified disorders of binocular movement: Secondary | ICD-10-CM | POA: Diagnosis not present

## 2023-05-04 DIAGNOSIS — G301 Alzheimer's disease with late onset: Secondary | ICD-10-CM | POA: Diagnosis not present

## 2023-05-04 DIAGNOSIS — Z466 Encounter for fitting and adjustment of urinary device: Secondary | ICD-10-CM | POA: Diagnosis not present

## 2023-05-04 DIAGNOSIS — I69393 Ataxia following cerebral infarction: Secondary | ICD-10-CM | POA: Diagnosis not present

## 2023-05-04 DIAGNOSIS — R32 Unspecified urinary incontinence: Secondary | ICD-10-CM | POA: Diagnosis not present

## 2023-05-04 DIAGNOSIS — F0150 Vascular dementia without behavioral disturbance: Secondary | ICD-10-CM | POA: Diagnosis not present

## 2023-05-04 DIAGNOSIS — I10 Essential (primary) hypertension: Secondary | ICD-10-CM | POA: Diagnosis not present

## 2023-05-04 DIAGNOSIS — M810 Age-related osteoporosis without current pathological fracture: Secondary | ICD-10-CM | POA: Diagnosis not present

## 2023-05-06 DIAGNOSIS — B351 Tinea unguium: Secondary | ICD-10-CM | POA: Diagnosis not present

## 2023-05-06 DIAGNOSIS — L03032 Cellulitis of left toe: Secondary | ICD-10-CM | POA: Diagnosis not present

## 2023-05-06 DIAGNOSIS — L02612 Cutaneous abscess of left foot: Secondary | ICD-10-CM | POA: Diagnosis not present

## 2023-05-06 DIAGNOSIS — L03031 Cellulitis of right toe: Secondary | ICD-10-CM | POA: Diagnosis not present

## 2023-05-06 DIAGNOSIS — L02611 Cutaneous abscess of right foot: Secondary | ICD-10-CM | POA: Diagnosis not present

## 2023-05-17 DIAGNOSIS — M810 Age-related osteoporosis without current pathological fracture: Secondary | ICD-10-CM | POA: Diagnosis not present

## 2023-05-17 DIAGNOSIS — L89152 Pressure ulcer of sacral region, stage 2: Secondary | ICD-10-CM | POA: Diagnosis not present

## 2023-05-17 DIAGNOSIS — I69393 Ataxia following cerebral infarction: Secondary | ICD-10-CM | POA: Diagnosis not present

## 2023-05-17 DIAGNOSIS — I10 Essential (primary) hypertension: Secondary | ICD-10-CM | POA: Diagnosis not present

## 2023-05-17 DIAGNOSIS — R32 Unspecified urinary incontinence: Secondary | ICD-10-CM | POA: Diagnosis not present

## 2023-05-17 DIAGNOSIS — Z466 Encounter for fitting and adjustment of urinary device: Secondary | ICD-10-CM | POA: Diagnosis not present

## 2023-05-17 DIAGNOSIS — H518 Other specified disorders of binocular movement: Secondary | ICD-10-CM | POA: Diagnosis not present

## 2023-05-17 DIAGNOSIS — F0150 Vascular dementia without behavioral disturbance: Secondary | ICD-10-CM | POA: Diagnosis not present

## 2023-05-17 DIAGNOSIS — G301 Alzheimer's disease with late onset: Secondary | ICD-10-CM | POA: Diagnosis not present

## 2023-06-01 DIAGNOSIS — I69393 Ataxia following cerebral infarction: Secondary | ICD-10-CM | POA: Diagnosis not present

## 2023-06-01 DIAGNOSIS — R32 Unspecified urinary incontinence: Secondary | ICD-10-CM | POA: Diagnosis not present

## 2023-06-01 DIAGNOSIS — M810 Age-related osteoporosis without current pathological fracture: Secondary | ICD-10-CM | POA: Diagnosis not present

## 2023-06-01 DIAGNOSIS — H518 Other specified disorders of binocular movement: Secondary | ICD-10-CM | POA: Diagnosis not present

## 2023-06-01 DIAGNOSIS — Z466 Encounter for fitting and adjustment of urinary device: Secondary | ICD-10-CM | POA: Diagnosis not present

## 2023-06-01 DIAGNOSIS — G301 Alzheimer's disease with late onset: Secondary | ICD-10-CM | POA: Diagnosis not present

## 2023-06-01 DIAGNOSIS — L89152 Pressure ulcer of sacral region, stage 2: Secondary | ICD-10-CM | POA: Diagnosis not present

## 2023-06-01 DIAGNOSIS — F0150 Vascular dementia without behavioral disturbance: Secondary | ICD-10-CM | POA: Diagnosis not present

## 2023-06-01 DIAGNOSIS — I10 Essential (primary) hypertension: Secondary | ICD-10-CM | POA: Diagnosis not present

## 2023-06-06 DIAGNOSIS — Z466 Encounter for fitting and adjustment of urinary device: Secondary | ICD-10-CM | POA: Diagnosis not present

## 2023-06-06 DIAGNOSIS — R32 Unspecified urinary incontinence: Secondary | ICD-10-CM | POA: Diagnosis not present

## 2023-06-06 DIAGNOSIS — M810 Age-related osteoporosis without current pathological fracture: Secondary | ICD-10-CM | POA: Diagnosis not present

## 2023-06-06 DIAGNOSIS — I10 Essential (primary) hypertension: Secondary | ICD-10-CM | POA: Diagnosis not present

## 2023-06-06 DIAGNOSIS — I69393 Ataxia following cerebral infarction: Secondary | ICD-10-CM | POA: Diagnosis not present

## 2023-06-06 DIAGNOSIS — F0150 Vascular dementia without behavioral disturbance: Secondary | ICD-10-CM | POA: Diagnosis not present

## 2023-06-06 DIAGNOSIS — L89152 Pressure ulcer of sacral region, stage 2: Secondary | ICD-10-CM | POA: Diagnosis not present

## 2023-06-06 DIAGNOSIS — G301 Alzheimer's disease with late onset: Secondary | ICD-10-CM | POA: Diagnosis not present

## 2023-06-06 DIAGNOSIS — H518 Other specified disorders of binocular movement: Secondary | ICD-10-CM | POA: Diagnosis not present

## 2023-06-15 DIAGNOSIS — G301 Alzheimer's disease with late onset: Secondary | ICD-10-CM | POA: Diagnosis not present

## 2023-06-15 DIAGNOSIS — I69393 Ataxia following cerebral infarction: Secondary | ICD-10-CM | POA: Diagnosis not present

## 2023-06-15 DIAGNOSIS — F0150 Vascular dementia without behavioral disturbance: Secondary | ICD-10-CM | POA: Diagnosis not present

## 2023-06-15 DIAGNOSIS — I10 Essential (primary) hypertension: Secondary | ICD-10-CM | POA: Diagnosis not present

## 2023-06-15 DIAGNOSIS — Z466 Encounter for fitting and adjustment of urinary device: Secondary | ICD-10-CM | POA: Diagnosis not present

## 2023-06-15 DIAGNOSIS — L89152 Pressure ulcer of sacral region, stage 2: Secondary | ICD-10-CM | POA: Diagnosis not present

## 2023-06-15 DIAGNOSIS — M810 Age-related osteoporosis without current pathological fracture: Secondary | ICD-10-CM | POA: Diagnosis not present

## 2023-06-15 DIAGNOSIS — H518 Other specified disorders of binocular movement: Secondary | ICD-10-CM | POA: Diagnosis not present

## 2023-06-15 DIAGNOSIS — R32 Unspecified urinary incontinence: Secondary | ICD-10-CM | POA: Diagnosis not present

## 2023-06-21 DIAGNOSIS — N39498 Other specified urinary incontinence: Secondary | ICD-10-CM | POA: Diagnosis not present

## 2023-06-21 DIAGNOSIS — L89892 Pressure ulcer of other site, stage 2: Secondary | ICD-10-CM | POA: Diagnosis not present

## 2023-06-29 DIAGNOSIS — R32 Unspecified urinary incontinence: Secondary | ICD-10-CM | POA: Diagnosis not present

## 2023-06-29 DIAGNOSIS — I69393 Ataxia following cerebral infarction: Secondary | ICD-10-CM | POA: Diagnosis not present

## 2023-06-29 DIAGNOSIS — I10 Essential (primary) hypertension: Secondary | ICD-10-CM | POA: Diagnosis not present

## 2023-06-29 DIAGNOSIS — H518 Other specified disorders of binocular movement: Secondary | ICD-10-CM | POA: Diagnosis not present

## 2023-06-29 DIAGNOSIS — M810 Age-related osteoporosis without current pathological fracture: Secondary | ICD-10-CM | POA: Diagnosis not present

## 2023-06-29 DIAGNOSIS — F0150 Vascular dementia without behavioral disturbance: Secondary | ICD-10-CM | POA: Diagnosis not present

## 2023-06-29 DIAGNOSIS — L89152 Pressure ulcer of sacral region, stage 2: Secondary | ICD-10-CM | POA: Diagnosis not present

## 2023-06-29 DIAGNOSIS — G301 Alzheimer's disease with late onset: Secondary | ICD-10-CM | POA: Diagnosis not present

## 2023-06-29 DIAGNOSIS — Z466 Encounter for fitting and adjustment of urinary device: Secondary | ICD-10-CM | POA: Diagnosis not present

## 2023-07-01 ENCOUNTER — Other Ambulatory Visit: Payer: Self-pay

## 2023-07-01 ENCOUNTER — Other Ambulatory Visit (HOSPITAL_COMMUNITY): Payer: Self-pay

## 2023-07-05 ENCOUNTER — Other Ambulatory Visit (HOSPITAL_COMMUNITY): Payer: Self-pay

## 2023-07-06 ENCOUNTER — Other Ambulatory Visit (HOSPITAL_COMMUNITY): Payer: Self-pay

## 2023-07-07 ENCOUNTER — Other Ambulatory Visit (HOSPITAL_COMMUNITY): Payer: Self-pay

## 2023-07-07 MED ORDER — SODIUM CHLORIDE 0.9 % IR SOLN
3 refills | Status: AC
  Filled 2023-07-07: qty 8000, 30d supply, fill #0

## 2023-07-08 ENCOUNTER — Other Ambulatory Visit (HOSPITAL_COMMUNITY): Payer: Self-pay

## 2023-07-09 ENCOUNTER — Other Ambulatory Visit (HOSPITAL_COMMUNITY): Payer: Self-pay

## 2023-07-18 DIAGNOSIS — N39498 Other specified urinary incontinence: Secondary | ICD-10-CM | POA: Diagnosis not present

## 2023-07-18 DIAGNOSIS — L89892 Pressure ulcer of other site, stage 2: Secondary | ICD-10-CM | POA: Diagnosis not present

## 2023-07-27 DIAGNOSIS — F0150 Vascular dementia without behavioral disturbance: Secondary | ICD-10-CM | POA: Diagnosis not present

## 2023-07-27 DIAGNOSIS — R32 Unspecified urinary incontinence: Secondary | ICD-10-CM | POA: Diagnosis not present

## 2023-07-27 DIAGNOSIS — M810 Age-related osteoporosis without current pathological fracture: Secondary | ICD-10-CM | POA: Diagnosis not present

## 2023-07-27 DIAGNOSIS — L89152 Pressure ulcer of sacral region, stage 2: Secondary | ICD-10-CM | POA: Diagnosis not present

## 2023-07-27 DIAGNOSIS — I69393 Ataxia following cerebral infarction: Secondary | ICD-10-CM | POA: Diagnosis not present

## 2023-07-27 DIAGNOSIS — Z466 Encounter for fitting and adjustment of urinary device: Secondary | ICD-10-CM | POA: Diagnosis not present

## 2023-07-27 DIAGNOSIS — I10 Essential (primary) hypertension: Secondary | ICD-10-CM | POA: Diagnosis not present

## 2023-07-27 DIAGNOSIS — H518 Other specified disorders of binocular movement: Secondary | ICD-10-CM | POA: Diagnosis not present

## 2023-07-27 DIAGNOSIS — G301 Alzheimer's disease with late onset: Secondary | ICD-10-CM | POA: Diagnosis not present

## 2023-08-24 DIAGNOSIS — I10 Essential (primary) hypertension: Secondary | ICD-10-CM | POA: Diagnosis not present

## 2023-08-24 DIAGNOSIS — F0150 Vascular dementia without behavioral disturbance: Secondary | ICD-10-CM | POA: Diagnosis not present

## 2023-08-24 DIAGNOSIS — I69993 Ataxia following unspecified cerebrovascular disease: Secondary | ICD-10-CM | POA: Diagnosis not present

## 2023-08-24 DIAGNOSIS — R32 Unspecified urinary incontinence: Secondary | ICD-10-CM | POA: Diagnosis not present

## 2023-08-24 DIAGNOSIS — F0280 Dementia in other diseases classified elsewhere without behavioral disturbance: Secondary | ICD-10-CM | POA: Diagnosis not present

## 2023-08-24 DIAGNOSIS — I700 Atherosclerosis of aorta: Secondary | ICD-10-CM | POA: Diagnosis not present

## 2023-08-24 DIAGNOSIS — G301 Alzheimer's disease with late onset: Secondary | ICD-10-CM | POA: Diagnosis not present

## 2023-08-24 DIAGNOSIS — M810 Age-related osteoporosis without current pathological fracture: Secondary | ICD-10-CM | POA: Diagnosis not present

## 2023-08-24 DIAGNOSIS — Z466 Encounter for fitting and adjustment of urinary device: Secondary | ICD-10-CM | POA: Diagnosis not present

## 2023-09-01 DIAGNOSIS — L89892 Pressure ulcer of other site, stage 2: Secondary | ICD-10-CM | POA: Diagnosis not present

## 2023-09-01 DIAGNOSIS — N39498 Other specified urinary incontinence: Secondary | ICD-10-CM | POA: Diagnosis not present

## 2023-09-06 DIAGNOSIS — I69993 Ataxia following unspecified cerebrovascular disease: Secondary | ICD-10-CM | POA: Diagnosis not present

## 2023-09-06 DIAGNOSIS — I700 Atherosclerosis of aorta: Secondary | ICD-10-CM | POA: Diagnosis not present

## 2023-09-06 DIAGNOSIS — G309 Alzheimer's disease, unspecified: Secondary | ICD-10-CM | POA: Diagnosis not present

## 2023-09-06 DIAGNOSIS — R32 Unspecified urinary incontinence: Secondary | ICD-10-CM | POA: Diagnosis not present

## 2023-09-06 DIAGNOSIS — Z7401 Bed confinement status: Secondary | ICD-10-CM | POA: Diagnosis not present

## 2023-09-06 DIAGNOSIS — F0280 Dementia in other diseases classified elsewhere without behavioral disturbance: Secondary | ICD-10-CM | POA: Diagnosis not present

## 2023-09-23 DIAGNOSIS — R32 Unspecified urinary incontinence: Secondary | ICD-10-CM | POA: Diagnosis not present

## 2023-09-23 DIAGNOSIS — F0280 Dementia in other diseases classified elsewhere without behavioral disturbance: Secondary | ICD-10-CM | POA: Diagnosis not present

## 2023-09-23 DIAGNOSIS — Z466 Encounter for fitting and adjustment of urinary device: Secondary | ICD-10-CM | POA: Diagnosis not present

## 2023-09-23 DIAGNOSIS — F0150 Vascular dementia without behavioral disturbance: Secondary | ICD-10-CM | POA: Diagnosis not present

## 2023-09-23 DIAGNOSIS — I69993 Ataxia following unspecified cerebrovascular disease: Secondary | ICD-10-CM | POA: Diagnosis not present

## 2023-09-23 DIAGNOSIS — G301 Alzheimer's disease with late onset: Secondary | ICD-10-CM | POA: Diagnosis not present

## 2023-09-23 DIAGNOSIS — I10 Essential (primary) hypertension: Secondary | ICD-10-CM | POA: Diagnosis not present

## 2023-09-23 DIAGNOSIS — M810 Age-related osteoporosis without current pathological fracture: Secondary | ICD-10-CM | POA: Diagnosis not present

## 2023-09-23 DIAGNOSIS — I700 Atherosclerosis of aorta: Secondary | ICD-10-CM | POA: Diagnosis not present

## 2023-09-29 DIAGNOSIS — L03031 Cellulitis of right toe: Secondary | ICD-10-CM | POA: Diagnosis not present

## 2023-09-29 DIAGNOSIS — L02612 Cutaneous abscess of left foot: Secondary | ICD-10-CM | POA: Diagnosis not present

## 2023-09-29 DIAGNOSIS — L02611 Cutaneous abscess of right foot: Secondary | ICD-10-CM | POA: Diagnosis not present

## 2023-09-29 DIAGNOSIS — L03032 Cellulitis of left toe: Secondary | ICD-10-CM | POA: Diagnosis not present

## 2023-09-29 DIAGNOSIS — B351 Tinea unguium: Secondary | ICD-10-CM | POA: Diagnosis not present

## 2023-10-18 DIAGNOSIS — I700 Atherosclerosis of aorta: Secondary | ICD-10-CM | POA: Diagnosis not present

## 2023-10-18 DIAGNOSIS — F0150 Vascular dementia without behavioral disturbance: Secondary | ICD-10-CM | POA: Diagnosis not present

## 2023-10-18 DIAGNOSIS — I69993 Ataxia following unspecified cerebrovascular disease: Secondary | ICD-10-CM | POA: Diagnosis not present

## 2023-10-18 DIAGNOSIS — F0280 Dementia in other diseases classified elsewhere without behavioral disturbance: Secondary | ICD-10-CM | POA: Diagnosis not present

## 2023-10-18 DIAGNOSIS — Z466 Encounter for fitting and adjustment of urinary device: Secondary | ICD-10-CM | POA: Diagnosis not present

## 2023-10-18 DIAGNOSIS — I10 Essential (primary) hypertension: Secondary | ICD-10-CM | POA: Diagnosis not present

## 2023-10-18 DIAGNOSIS — M810 Age-related osteoporosis without current pathological fracture: Secondary | ICD-10-CM | POA: Diagnosis not present

## 2023-10-18 DIAGNOSIS — R32 Unspecified urinary incontinence: Secondary | ICD-10-CM | POA: Diagnosis not present

## 2023-10-18 DIAGNOSIS — G301 Alzheimer's disease with late onset: Secondary | ICD-10-CM | POA: Diagnosis not present

## 2023-10-23 DIAGNOSIS — I10 Essential (primary) hypertension: Secondary | ICD-10-CM | POA: Diagnosis not present

## 2023-10-23 DIAGNOSIS — G301 Alzheimer's disease with late onset: Secondary | ICD-10-CM | POA: Diagnosis not present

## 2023-10-23 DIAGNOSIS — M810 Age-related osteoporosis without current pathological fracture: Secondary | ICD-10-CM | POA: Diagnosis not present

## 2023-10-23 DIAGNOSIS — R32 Unspecified urinary incontinence: Secondary | ICD-10-CM | POA: Diagnosis not present

## 2023-10-23 DIAGNOSIS — F0150 Vascular dementia without behavioral disturbance: Secondary | ICD-10-CM | POA: Diagnosis not present

## 2023-10-23 DIAGNOSIS — F0280 Dementia in other diseases classified elsewhere without behavioral disturbance: Secondary | ICD-10-CM | POA: Diagnosis not present

## 2023-10-23 DIAGNOSIS — I700 Atherosclerosis of aorta: Secondary | ICD-10-CM | POA: Diagnosis not present

## 2023-10-23 DIAGNOSIS — I69993 Ataxia following unspecified cerebrovascular disease: Secondary | ICD-10-CM | POA: Diagnosis not present

## 2023-10-23 DIAGNOSIS — Z466 Encounter for fitting and adjustment of urinary device: Secondary | ICD-10-CM | POA: Diagnosis not present

## 2023-10-25 DIAGNOSIS — Z466 Encounter for fitting and adjustment of urinary device: Secondary | ICD-10-CM | POA: Diagnosis not present

## 2023-10-25 DIAGNOSIS — L89892 Pressure ulcer of other site, stage 2: Secondary | ICD-10-CM | POA: Diagnosis not present

## 2023-10-25 DIAGNOSIS — F0280 Dementia in other diseases classified elsewhere without behavioral disturbance: Secondary | ICD-10-CM | POA: Diagnosis not present

## 2023-10-25 DIAGNOSIS — M810 Age-related osteoporosis without current pathological fracture: Secondary | ICD-10-CM | POA: Diagnosis not present

## 2023-10-25 DIAGNOSIS — F0150 Vascular dementia without behavioral disturbance: Secondary | ICD-10-CM | POA: Diagnosis not present

## 2023-10-25 DIAGNOSIS — I700 Atherosclerosis of aorta: Secondary | ICD-10-CM | POA: Diagnosis not present

## 2023-10-25 DIAGNOSIS — R32 Unspecified urinary incontinence: Secondary | ICD-10-CM | POA: Diagnosis not present

## 2023-10-25 DIAGNOSIS — N39498 Other specified urinary incontinence: Secondary | ICD-10-CM | POA: Diagnosis not present

## 2023-10-25 DIAGNOSIS — G301 Alzheimer's disease with late onset: Secondary | ICD-10-CM | POA: Diagnosis not present

## 2023-10-25 DIAGNOSIS — I10 Essential (primary) hypertension: Secondary | ICD-10-CM | POA: Diagnosis not present

## 2023-10-25 DIAGNOSIS — I69993 Ataxia following unspecified cerebrovascular disease: Secondary | ICD-10-CM | POA: Diagnosis not present

## 2023-11-17 ENCOUNTER — Other Ambulatory Visit (HOSPITAL_COMMUNITY): Payer: Self-pay

## 2023-11-22 DIAGNOSIS — I69993 Ataxia following unspecified cerebrovascular disease: Secondary | ICD-10-CM | POA: Diagnosis not present

## 2023-11-22 DIAGNOSIS — I700 Atherosclerosis of aorta: Secondary | ICD-10-CM | POA: Diagnosis not present

## 2023-11-22 DIAGNOSIS — G301 Alzheimer's disease with late onset: Secondary | ICD-10-CM | POA: Diagnosis not present

## 2023-11-22 DIAGNOSIS — I10 Essential (primary) hypertension: Secondary | ICD-10-CM | POA: Diagnosis not present

## 2023-11-22 DIAGNOSIS — R32 Unspecified urinary incontinence: Secondary | ICD-10-CM | POA: Diagnosis not present

## 2023-11-22 DIAGNOSIS — M810 Age-related osteoporosis without current pathological fracture: Secondary | ICD-10-CM | POA: Diagnosis not present

## 2023-11-22 DIAGNOSIS — F0150 Vascular dementia without behavioral disturbance: Secondary | ICD-10-CM | POA: Diagnosis not present

## 2023-11-22 DIAGNOSIS — F0280 Dementia in other diseases classified elsewhere without behavioral disturbance: Secondary | ICD-10-CM | POA: Diagnosis not present

## 2023-11-22 DIAGNOSIS — Z466 Encounter for fitting and adjustment of urinary device: Secondary | ICD-10-CM | POA: Diagnosis not present

## 2023-12-15 DIAGNOSIS — Z466 Encounter for fitting and adjustment of urinary device: Secondary | ICD-10-CM | POA: Diagnosis not present

## 2023-12-15 DIAGNOSIS — M810 Age-related osteoporosis without current pathological fracture: Secondary | ICD-10-CM | POA: Diagnosis not present

## 2023-12-15 DIAGNOSIS — I69993 Ataxia following unspecified cerebrovascular disease: Secondary | ICD-10-CM | POA: Diagnosis not present

## 2023-12-15 DIAGNOSIS — I700 Atherosclerosis of aorta: Secondary | ICD-10-CM | POA: Diagnosis not present

## 2023-12-15 DIAGNOSIS — F0280 Dementia in other diseases classified elsewhere without behavioral disturbance: Secondary | ICD-10-CM | POA: Diagnosis not present

## 2023-12-15 DIAGNOSIS — G301 Alzheimer's disease with late onset: Secondary | ICD-10-CM | POA: Diagnosis not present

## 2023-12-15 DIAGNOSIS — R32 Unspecified urinary incontinence: Secondary | ICD-10-CM | POA: Diagnosis not present

## 2023-12-15 DIAGNOSIS — F0150 Vascular dementia without behavioral disturbance: Secondary | ICD-10-CM | POA: Diagnosis not present

## 2023-12-15 DIAGNOSIS — I10 Essential (primary) hypertension: Secondary | ICD-10-CM | POA: Diagnosis not present

## 2023-12-21 DIAGNOSIS — F0280 Dementia in other diseases classified elsewhere without behavioral disturbance: Secondary | ICD-10-CM | POA: Diagnosis not present

## 2023-12-21 DIAGNOSIS — I700 Atherosclerosis of aorta: Secondary | ICD-10-CM | POA: Diagnosis not present

## 2023-12-21 DIAGNOSIS — F0150 Vascular dementia without behavioral disturbance: Secondary | ICD-10-CM | POA: Diagnosis not present

## 2023-12-21 DIAGNOSIS — M810 Age-related osteoporosis without current pathological fracture: Secondary | ICD-10-CM | POA: Diagnosis not present

## 2023-12-21 DIAGNOSIS — I10 Essential (primary) hypertension: Secondary | ICD-10-CM | POA: Diagnosis not present

## 2023-12-21 DIAGNOSIS — R32 Unspecified urinary incontinence: Secondary | ICD-10-CM | POA: Diagnosis not present

## 2023-12-21 DIAGNOSIS — Z466 Encounter for fitting and adjustment of urinary device: Secondary | ICD-10-CM | POA: Diagnosis not present

## 2023-12-21 DIAGNOSIS — G301 Alzheimer's disease with late onset: Secondary | ICD-10-CM | POA: Diagnosis not present

## 2023-12-21 DIAGNOSIS — I69993 Ataxia following unspecified cerebrovascular disease: Secondary | ICD-10-CM | POA: Diagnosis not present

## 2023-12-22 DIAGNOSIS — M810 Age-related osteoporosis without current pathological fracture: Secondary | ICD-10-CM | POA: Diagnosis not present

## 2023-12-22 DIAGNOSIS — I69993 Ataxia following unspecified cerebrovascular disease: Secondary | ICD-10-CM | POA: Diagnosis not present

## 2023-12-22 DIAGNOSIS — R32 Unspecified urinary incontinence: Secondary | ICD-10-CM | POA: Diagnosis not present

## 2023-12-22 DIAGNOSIS — F0280 Dementia in other diseases classified elsewhere without behavioral disturbance: Secondary | ICD-10-CM | POA: Diagnosis not present

## 2023-12-22 DIAGNOSIS — I10 Essential (primary) hypertension: Secondary | ICD-10-CM | POA: Diagnosis not present

## 2023-12-22 DIAGNOSIS — I700 Atherosclerosis of aorta: Secondary | ICD-10-CM | POA: Diagnosis not present

## 2023-12-22 DIAGNOSIS — G301 Alzheimer's disease with late onset: Secondary | ICD-10-CM | POA: Diagnosis not present

## 2023-12-22 DIAGNOSIS — F0150 Vascular dementia without behavioral disturbance: Secondary | ICD-10-CM | POA: Diagnosis not present

## 2023-12-22 DIAGNOSIS — Z466 Encounter for fitting and adjustment of urinary device: Secondary | ICD-10-CM | POA: Diagnosis not present

## 2023-12-29 DIAGNOSIS — L03032 Cellulitis of left toe: Secondary | ICD-10-CM | POA: Diagnosis not present

## 2023-12-29 DIAGNOSIS — L02612 Cutaneous abscess of left foot: Secondary | ICD-10-CM | POA: Diagnosis not present

## 2023-12-29 DIAGNOSIS — B351 Tinea unguium: Secondary | ICD-10-CM | POA: Diagnosis not present

## 2023-12-29 DIAGNOSIS — L03031 Cellulitis of right toe: Secondary | ICD-10-CM | POA: Diagnosis not present

## 2023-12-29 DIAGNOSIS — L02611 Cutaneous abscess of right foot: Secondary | ICD-10-CM | POA: Diagnosis not present

## 2024-01-04 DIAGNOSIS — L89892 Pressure ulcer of other site, stage 2: Secondary | ICD-10-CM | POA: Diagnosis not present

## 2024-01-04 DIAGNOSIS — N39498 Other specified urinary incontinence: Secondary | ICD-10-CM | POA: Diagnosis not present

## 2024-01-11 DIAGNOSIS — I69993 Ataxia following unspecified cerebrovascular disease: Secondary | ICD-10-CM | POA: Diagnosis not present

## 2024-01-11 DIAGNOSIS — R32 Unspecified urinary incontinence: Secondary | ICD-10-CM | POA: Diagnosis not present

## 2024-01-11 DIAGNOSIS — Z466 Encounter for fitting and adjustment of urinary device: Secondary | ICD-10-CM | POA: Diagnosis not present

## 2024-01-11 DIAGNOSIS — F0150 Vascular dementia without behavioral disturbance: Secondary | ICD-10-CM | POA: Diagnosis not present

## 2024-01-11 DIAGNOSIS — G301 Alzheimer's disease with late onset: Secondary | ICD-10-CM | POA: Diagnosis not present

## 2024-01-11 DIAGNOSIS — I700 Atherosclerosis of aorta: Secondary | ICD-10-CM | POA: Diagnosis not present

## 2024-01-11 DIAGNOSIS — I10 Essential (primary) hypertension: Secondary | ICD-10-CM | POA: Diagnosis not present

## 2024-01-11 DIAGNOSIS — F0280 Dementia in other diseases classified elsewhere without behavioral disturbance: Secondary | ICD-10-CM | POA: Diagnosis not present

## 2024-01-11 DIAGNOSIS — M810 Age-related osteoporosis without current pathological fracture: Secondary | ICD-10-CM | POA: Diagnosis not present

## 2024-02-09 DIAGNOSIS — I10 Essential (primary) hypertension: Secondary | ICD-10-CM | POA: Diagnosis not present

## 2024-02-09 DIAGNOSIS — Z466 Encounter for fitting and adjustment of urinary device: Secondary | ICD-10-CM | POA: Diagnosis not present

## 2024-02-09 DIAGNOSIS — M810 Age-related osteoporosis without current pathological fracture: Secondary | ICD-10-CM | POA: Diagnosis not present

## 2024-02-09 DIAGNOSIS — G301 Alzheimer's disease with late onset: Secondary | ICD-10-CM | POA: Diagnosis not present

## 2024-02-09 DIAGNOSIS — I700 Atherosclerosis of aorta: Secondary | ICD-10-CM | POA: Diagnosis not present

## 2024-02-09 DIAGNOSIS — I69993 Ataxia following unspecified cerebrovascular disease: Secondary | ICD-10-CM | POA: Diagnosis not present

## 2024-02-09 DIAGNOSIS — R32 Unspecified urinary incontinence: Secondary | ICD-10-CM | POA: Diagnosis not present

## 2024-02-09 DIAGNOSIS — F0280 Dementia in other diseases classified elsewhere without behavioral disturbance: Secondary | ICD-10-CM | POA: Diagnosis not present

## 2024-02-09 DIAGNOSIS — F0150 Vascular dementia without behavioral disturbance: Secondary | ICD-10-CM | POA: Diagnosis not present

## 2024-02-16 DIAGNOSIS — F0150 Vascular dementia without behavioral disturbance: Secondary | ICD-10-CM | POA: Diagnosis not present

## 2024-02-16 DIAGNOSIS — Z466 Encounter for fitting and adjustment of urinary device: Secondary | ICD-10-CM | POA: Diagnosis not present

## 2024-02-16 DIAGNOSIS — M810 Age-related osteoporosis without current pathological fracture: Secondary | ICD-10-CM | POA: Diagnosis not present

## 2024-02-16 DIAGNOSIS — F0280 Dementia in other diseases classified elsewhere without behavioral disturbance: Secondary | ICD-10-CM | POA: Diagnosis not present

## 2024-02-16 DIAGNOSIS — I69993 Ataxia following unspecified cerebrovascular disease: Secondary | ICD-10-CM | POA: Diagnosis not present

## 2024-02-16 DIAGNOSIS — I10 Essential (primary) hypertension: Secondary | ICD-10-CM | POA: Diagnosis not present

## 2024-02-16 DIAGNOSIS — I700 Atherosclerosis of aorta: Secondary | ICD-10-CM | POA: Diagnosis not present

## 2024-02-16 DIAGNOSIS — G301 Alzheimer's disease with late onset: Secondary | ICD-10-CM | POA: Diagnosis not present

## 2024-02-16 DIAGNOSIS — R32 Unspecified urinary incontinence: Secondary | ICD-10-CM | POA: Diagnosis not present

## 2024-02-20 DIAGNOSIS — R32 Unspecified urinary incontinence: Secondary | ICD-10-CM | POA: Diagnosis not present

## 2024-02-20 DIAGNOSIS — I700 Atherosclerosis of aorta: Secondary | ICD-10-CM | POA: Diagnosis not present

## 2024-02-20 DIAGNOSIS — Z466 Encounter for fitting and adjustment of urinary device: Secondary | ICD-10-CM | POA: Diagnosis not present

## 2024-02-20 DIAGNOSIS — G301 Alzheimer's disease with late onset: Secondary | ICD-10-CM | POA: Diagnosis not present

## 2024-02-20 DIAGNOSIS — F0150 Vascular dementia without behavioral disturbance: Secondary | ICD-10-CM | POA: Diagnosis not present

## 2024-02-20 DIAGNOSIS — I10 Essential (primary) hypertension: Secondary | ICD-10-CM | POA: Diagnosis not present

## 2024-02-20 DIAGNOSIS — M810 Age-related osteoporosis without current pathological fracture: Secondary | ICD-10-CM | POA: Diagnosis not present

## 2024-02-20 DIAGNOSIS — F0280 Dementia in other diseases classified elsewhere without behavioral disturbance: Secondary | ICD-10-CM | POA: Diagnosis not present

## 2024-02-20 DIAGNOSIS — I69393 Ataxia following cerebral infarction: Secondary | ICD-10-CM | POA: Diagnosis not present

## 2024-03-08 DIAGNOSIS — R32 Unspecified urinary incontinence: Secondary | ICD-10-CM | POA: Diagnosis not present

## 2024-03-08 DIAGNOSIS — I10 Essential (primary) hypertension: Secondary | ICD-10-CM | POA: Diagnosis not present

## 2024-03-08 DIAGNOSIS — F0150 Vascular dementia without behavioral disturbance: Secondary | ICD-10-CM | POA: Diagnosis not present

## 2024-03-08 DIAGNOSIS — F0280 Dementia in other diseases classified elsewhere without behavioral disturbance: Secondary | ICD-10-CM | POA: Diagnosis not present

## 2024-03-08 DIAGNOSIS — M810 Age-related osteoporosis without current pathological fracture: Secondary | ICD-10-CM | POA: Diagnosis not present

## 2024-03-08 DIAGNOSIS — I700 Atherosclerosis of aorta: Secondary | ICD-10-CM | POA: Diagnosis not present

## 2024-03-08 DIAGNOSIS — Z466 Encounter for fitting and adjustment of urinary device: Secondary | ICD-10-CM | POA: Diagnosis not present

## 2024-03-08 DIAGNOSIS — I69393 Ataxia following cerebral infarction: Secondary | ICD-10-CM | POA: Diagnosis not present

## 2024-03-08 DIAGNOSIS — G301 Alzheimer's disease with late onset: Secondary | ICD-10-CM | POA: Diagnosis not present

## 2024-03-14 DIAGNOSIS — F0150 Vascular dementia without behavioral disturbance: Secondary | ICD-10-CM | POA: Diagnosis not present

## 2024-03-14 DIAGNOSIS — Z466 Encounter for fitting and adjustment of urinary device: Secondary | ICD-10-CM | POA: Diagnosis not present

## 2024-03-14 DIAGNOSIS — R32 Unspecified urinary incontinence: Secondary | ICD-10-CM | POA: Diagnosis not present

## 2024-03-14 DIAGNOSIS — G301 Alzheimer's disease with late onset: Secondary | ICD-10-CM | POA: Diagnosis not present

## 2024-03-14 DIAGNOSIS — I10 Essential (primary) hypertension: Secondary | ICD-10-CM | POA: Diagnosis not present

## 2024-04-12 DIAGNOSIS — R32 Unspecified urinary incontinence: Secondary | ICD-10-CM | POA: Diagnosis not present

## 2024-04-12 DIAGNOSIS — Z466 Encounter for fitting and adjustment of urinary device: Secondary | ICD-10-CM | POA: Diagnosis not present

## 2024-04-12 DIAGNOSIS — G301 Alzheimer's disease with late onset: Secondary | ICD-10-CM | POA: Diagnosis not present

## 2024-04-12 DIAGNOSIS — F0280 Dementia in other diseases classified elsewhere without behavioral disturbance: Secondary | ICD-10-CM | POA: Diagnosis not present

## 2024-04-12 DIAGNOSIS — I700 Atherosclerosis of aorta: Secondary | ICD-10-CM | POA: Diagnosis not present

## 2024-04-12 DIAGNOSIS — M810 Age-related osteoporosis without current pathological fracture: Secondary | ICD-10-CM | POA: Diagnosis not present

## 2024-04-12 DIAGNOSIS — I69393 Ataxia following cerebral infarction: Secondary | ICD-10-CM | POA: Diagnosis not present

## 2024-04-12 DIAGNOSIS — I10 Essential (primary) hypertension: Secondary | ICD-10-CM | POA: Diagnosis not present

## 2024-04-12 DIAGNOSIS — F0150 Vascular dementia without behavioral disturbance: Secondary | ICD-10-CM | POA: Diagnosis not present

## 2024-04-16 DIAGNOSIS — G301 Alzheimer's disease with late onset: Secondary | ICD-10-CM | POA: Diagnosis not present

## 2024-04-16 DIAGNOSIS — I69393 Ataxia following cerebral infarction: Secondary | ICD-10-CM | POA: Diagnosis not present

## 2024-04-16 DIAGNOSIS — F0150 Vascular dementia without behavioral disturbance: Secondary | ICD-10-CM | POA: Diagnosis not present

## 2024-04-16 DIAGNOSIS — I700 Atherosclerosis of aorta: Secondary | ICD-10-CM | POA: Diagnosis not present

## 2024-04-16 DIAGNOSIS — I10 Essential (primary) hypertension: Secondary | ICD-10-CM | POA: Diagnosis not present

## 2024-04-16 DIAGNOSIS — F0280 Dementia in other diseases classified elsewhere without behavioral disturbance: Secondary | ICD-10-CM | POA: Diagnosis not present

## 2024-04-16 DIAGNOSIS — Z466 Encounter for fitting and adjustment of urinary device: Secondary | ICD-10-CM | POA: Diagnosis not present

## 2024-04-16 DIAGNOSIS — R32 Unspecified urinary incontinence: Secondary | ICD-10-CM | POA: Diagnosis not present

## 2024-04-16 DIAGNOSIS — M810 Age-related osteoporosis without current pathological fracture: Secondary | ICD-10-CM | POA: Diagnosis not present

## 2024-04-20 DIAGNOSIS — G301 Alzheimer's disease with late onset: Secondary | ICD-10-CM | POA: Diagnosis not present

## 2024-04-20 DIAGNOSIS — I700 Atherosclerosis of aorta: Secondary | ICD-10-CM | POA: Diagnosis not present

## 2024-04-20 DIAGNOSIS — F0280 Dementia in other diseases classified elsewhere without behavioral disturbance: Secondary | ICD-10-CM | POA: Diagnosis not present

## 2024-04-20 DIAGNOSIS — I69393 Ataxia following cerebral infarction: Secondary | ICD-10-CM | POA: Diagnosis not present

## 2024-04-20 DIAGNOSIS — I10 Essential (primary) hypertension: Secondary | ICD-10-CM | POA: Diagnosis not present

## 2024-04-20 DIAGNOSIS — F0150 Vascular dementia without behavioral disturbance: Secondary | ICD-10-CM | POA: Diagnosis not present

## 2024-04-20 DIAGNOSIS — R32 Unspecified urinary incontinence: Secondary | ICD-10-CM | POA: Diagnosis not present

## 2024-04-20 DIAGNOSIS — Z466 Encounter for fitting and adjustment of urinary device: Secondary | ICD-10-CM | POA: Diagnosis not present

## 2024-04-20 DIAGNOSIS — M810 Age-related osteoporosis without current pathological fracture: Secondary | ICD-10-CM | POA: Diagnosis not present

## 2024-05-10 DIAGNOSIS — F0150 Vascular dementia without behavioral disturbance: Secondary | ICD-10-CM | POA: Diagnosis not present

## 2024-05-10 DIAGNOSIS — R32 Unspecified urinary incontinence: Secondary | ICD-10-CM | POA: Diagnosis not present

## 2024-05-10 DIAGNOSIS — Z466 Encounter for fitting and adjustment of urinary device: Secondary | ICD-10-CM | POA: Diagnosis not present

## 2024-05-10 DIAGNOSIS — G301 Alzheimer's disease with late onset: Secondary | ICD-10-CM | POA: Diagnosis not present

## 2024-05-10 DIAGNOSIS — M810 Age-related osteoporosis without current pathological fracture: Secondary | ICD-10-CM | POA: Diagnosis not present

## 2024-05-10 DIAGNOSIS — I10 Essential (primary) hypertension: Secondary | ICD-10-CM | POA: Diagnosis not present

## 2024-05-10 DIAGNOSIS — F0280 Dementia in other diseases classified elsewhere without behavioral disturbance: Secondary | ICD-10-CM | POA: Diagnosis not present

## 2024-05-10 DIAGNOSIS — I69393 Ataxia following cerebral infarction: Secondary | ICD-10-CM | POA: Diagnosis not present

## 2024-05-10 DIAGNOSIS — I700 Atherosclerosis of aorta: Secondary | ICD-10-CM | POA: Diagnosis not present

## 2024-05-15 DIAGNOSIS — N39498 Other specified urinary incontinence: Secondary | ICD-10-CM | POA: Diagnosis not present

## 2024-05-15 DIAGNOSIS — L89892 Pressure ulcer of other site, stage 2: Secondary | ICD-10-CM | POA: Diagnosis not present

## 2024-06-08 DIAGNOSIS — I10 Essential (primary) hypertension: Secondary | ICD-10-CM | POA: Diagnosis not present

## 2024-06-08 DIAGNOSIS — R32 Unspecified urinary incontinence: Secondary | ICD-10-CM | POA: Diagnosis not present

## 2024-06-08 DIAGNOSIS — I700 Atherosclerosis of aorta: Secondary | ICD-10-CM | POA: Diagnosis not present

## 2024-06-08 DIAGNOSIS — F0150 Vascular dementia without behavioral disturbance: Secondary | ICD-10-CM | POA: Diagnosis not present

## 2024-06-08 DIAGNOSIS — I69393 Ataxia following cerebral infarction: Secondary | ICD-10-CM | POA: Diagnosis not present

## 2024-06-08 DIAGNOSIS — G301 Alzheimer's disease with late onset: Secondary | ICD-10-CM | POA: Diagnosis not present

## 2024-06-08 DIAGNOSIS — F0280 Dementia in other diseases classified elsewhere without behavioral disturbance: Secondary | ICD-10-CM | POA: Diagnosis not present

## 2024-06-08 DIAGNOSIS — Z466 Encounter for fitting and adjustment of urinary device: Secondary | ICD-10-CM | POA: Diagnosis not present

## 2024-06-08 DIAGNOSIS — M810 Age-related osteoporosis without current pathological fracture: Secondary | ICD-10-CM | POA: Diagnosis not present

## 2024-06-12 DIAGNOSIS — L89892 Pressure ulcer of other site, stage 2: Secondary | ICD-10-CM | POA: Diagnosis not present

## 2024-06-12 DIAGNOSIS — N39498 Other specified urinary incontinence: Secondary | ICD-10-CM | POA: Diagnosis not present

## 2024-06-14 DIAGNOSIS — I69393 Ataxia following cerebral infarction: Secondary | ICD-10-CM | POA: Diagnosis not present

## 2024-06-14 DIAGNOSIS — M810 Age-related osteoporosis without current pathological fracture: Secondary | ICD-10-CM | POA: Diagnosis not present

## 2024-06-14 DIAGNOSIS — F0150 Vascular dementia without behavioral disturbance: Secondary | ICD-10-CM | POA: Diagnosis not present

## 2024-06-14 DIAGNOSIS — F0280 Dementia in other diseases classified elsewhere without behavioral disturbance: Secondary | ICD-10-CM | POA: Diagnosis not present

## 2024-06-14 DIAGNOSIS — Z466 Encounter for fitting and adjustment of urinary device: Secondary | ICD-10-CM | POA: Diagnosis not present

## 2024-06-14 DIAGNOSIS — G301 Alzheimer's disease with late onset: Secondary | ICD-10-CM | POA: Diagnosis not present

## 2024-06-14 DIAGNOSIS — I700 Atherosclerosis of aorta: Secondary | ICD-10-CM | POA: Diagnosis not present

## 2024-06-14 DIAGNOSIS — R32 Unspecified urinary incontinence: Secondary | ICD-10-CM | POA: Diagnosis not present

## 2024-06-14 DIAGNOSIS — I10 Essential (primary) hypertension: Secondary | ICD-10-CM | POA: Diagnosis not present

## 2024-06-19 DIAGNOSIS — Z466 Encounter for fitting and adjustment of urinary device: Secondary | ICD-10-CM | POA: Diagnosis not present

## 2024-06-19 DIAGNOSIS — G301 Alzheimer's disease with late onset: Secondary | ICD-10-CM | POA: Diagnosis not present

## 2024-06-19 DIAGNOSIS — I69393 Ataxia following cerebral infarction: Secondary | ICD-10-CM | POA: Diagnosis not present

## 2024-06-19 DIAGNOSIS — M810 Age-related osteoporosis without current pathological fracture: Secondary | ICD-10-CM | POA: Diagnosis not present

## 2024-06-19 DIAGNOSIS — I10 Essential (primary) hypertension: Secondary | ICD-10-CM | POA: Diagnosis not present

## 2024-06-19 DIAGNOSIS — F0150 Vascular dementia without behavioral disturbance: Secondary | ICD-10-CM | POA: Diagnosis not present

## 2024-06-19 DIAGNOSIS — I700 Atherosclerosis of aorta: Secondary | ICD-10-CM | POA: Diagnosis not present

## 2024-06-19 DIAGNOSIS — R32 Unspecified urinary incontinence: Secondary | ICD-10-CM | POA: Diagnosis not present

## 2024-06-19 DIAGNOSIS — F0280 Dementia in other diseases classified elsewhere without behavioral disturbance: Secondary | ICD-10-CM | POA: Diagnosis not present

## 2024-07-06 DIAGNOSIS — M810 Age-related osteoporosis without current pathological fracture: Secondary | ICD-10-CM | POA: Diagnosis not present

## 2024-07-06 DIAGNOSIS — I700 Atherosclerosis of aorta: Secondary | ICD-10-CM | POA: Diagnosis not present

## 2024-07-06 DIAGNOSIS — Z466 Encounter for fitting and adjustment of urinary device: Secondary | ICD-10-CM | POA: Diagnosis not present

## 2024-07-06 DIAGNOSIS — R32 Unspecified urinary incontinence: Secondary | ICD-10-CM | POA: Diagnosis not present

## 2024-07-06 DIAGNOSIS — I10 Essential (primary) hypertension: Secondary | ICD-10-CM | POA: Diagnosis not present

## 2024-07-06 DIAGNOSIS — I69393 Ataxia following cerebral infarction: Secondary | ICD-10-CM | POA: Diagnosis not present

## 2024-07-06 DIAGNOSIS — F0280 Dementia in other diseases classified elsewhere without behavioral disturbance: Secondary | ICD-10-CM | POA: Diagnosis not present

## 2024-07-06 DIAGNOSIS — F0150 Vascular dementia without behavioral disturbance: Secondary | ICD-10-CM | POA: Diagnosis not present

## 2024-07-06 DIAGNOSIS — G301 Alzheimer's disease with late onset: Secondary | ICD-10-CM | POA: Diagnosis not present

## 2024-07-10 DIAGNOSIS — N39498 Other specified urinary incontinence: Secondary | ICD-10-CM | POA: Diagnosis not present

## 2024-07-10 DIAGNOSIS — L89892 Pressure ulcer of other site, stage 2: Secondary | ICD-10-CM | POA: Diagnosis not present

## 2024-08-03 DIAGNOSIS — I700 Atherosclerosis of aorta: Secondary | ICD-10-CM | POA: Diagnosis not present

## 2024-08-03 DIAGNOSIS — F0150 Vascular dementia without behavioral disturbance: Secondary | ICD-10-CM | POA: Diagnosis not present

## 2024-08-03 DIAGNOSIS — G301 Alzheimer's disease with late onset: Secondary | ICD-10-CM | POA: Diagnosis not present

## 2024-08-03 DIAGNOSIS — R32 Unspecified urinary incontinence: Secondary | ICD-10-CM | POA: Diagnosis not present

## 2024-08-03 DIAGNOSIS — Z466 Encounter for fitting and adjustment of urinary device: Secondary | ICD-10-CM | POA: Diagnosis not present

## 2024-08-03 DIAGNOSIS — F0280 Dementia in other diseases classified elsewhere without behavioral disturbance: Secondary | ICD-10-CM | POA: Diagnosis not present

## 2024-08-03 DIAGNOSIS — M810 Age-related osteoporosis without current pathological fracture: Secondary | ICD-10-CM | POA: Diagnosis not present

## 2024-08-03 DIAGNOSIS — I69393 Ataxia following cerebral infarction: Secondary | ICD-10-CM | POA: Diagnosis not present

## 2024-08-03 DIAGNOSIS — I10 Essential (primary) hypertension: Secondary | ICD-10-CM | POA: Diagnosis not present

## 2024-08-06 DIAGNOSIS — L89892 Pressure ulcer of other site, stage 2: Secondary | ICD-10-CM | POA: Diagnosis not present

## 2024-08-06 DIAGNOSIS — N39498 Other specified urinary incontinence: Secondary | ICD-10-CM | POA: Diagnosis not present

## 2024-08-09 DIAGNOSIS — Z5982 Transportation insecurity: Secondary | ICD-10-CM | POA: Diagnosis not present

## 2024-08-09 DIAGNOSIS — F0280 Dementia in other diseases classified elsewhere without behavioral disturbance: Secondary | ICD-10-CM | POA: Diagnosis not present

## 2024-08-09 DIAGNOSIS — R32 Unspecified urinary incontinence: Secondary | ICD-10-CM | POA: Diagnosis not present

## 2024-08-09 DIAGNOSIS — Z7401 Bed confinement status: Secondary | ICD-10-CM | POA: Diagnosis not present

## 2024-08-09 DIAGNOSIS — B372 Candidiasis of skin and nail: Secondary | ICD-10-CM | POA: Diagnosis not present

## 2024-08-09 DIAGNOSIS — G309 Alzheimer's disease, unspecified: Secondary | ICD-10-CM | POA: Diagnosis not present

## 2024-08-09 DIAGNOSIS — F0150 Vascular dementia without behavioral disturbance: Secondary | ICD-10-CM | POA: Diagnosis not present

## 2024-08-09 DIAGNOSIS — R270 Ataxia, unspecified: Secondary | ICD-10-CM | POA: Diagnosis not present

## 2024-08-09 DIAGNOSIS — M1990 Unspecified osteoarthritis, unspecified site: Secondary | ICD-10-CM | POA: Diagnosis not present

## 2024-08-13 DIAGNOSIS — I10 Essential (primary) hypertension: Secondary | ICD-10-CM | POA: Diagnosis not present

## 2024-08-13 DIAGNOSIS — R32 Unspecified urinary incontinence: Secondary | ICD-10-CM | POA: Diagnosis not present

## 2024-08-13 DIAGNOSIS — F0150 Vascular dementia without behavioral disturbance: Secondary | ICD-10-CM | POA: Diagnosis not present

## 2024-08-13 DIAGNOSIS — F0280 Dementia in other diseases classified elsewhere without behavioral disturbance: Secondary | ICD-10-CM | POA: Diagnosis not present

## 2024-08-13 DIAGNOSIS — I69393 Ataxia following cerebral infarction: Secondary | ICD-10-CM | POA: Diagnosis not present

## 2024-08-13 DIAGNOSIS — Z466 Encounter for fitting and adjustment of urinary device: Secondary | ICD-10-CM | POA: Diagnosis not present

## 2024-08-13 DIAGNOSIS — I700 Atherosclerosis of aorta: Secondary | ICD-10-CM | POA: Diagnosis not present

## 2024-08-13 DIAGNOSIS — M810 Age-related osteoporosis without current pathological fracture: Secondary | ICD-10-CM | POA: Diagnosis not present

## 2024-08-13 DIAGNOSIS — G301 Alzheimer's disease with late onset: Secondary | ICD-10-CM | POA: Diagnosis not present

## 2024-08-15 DIAGNOSIS — L89892 Pressure ulcer of other site, stage 2: Secondary | ICD-10-CM | POA: Diagnosis not present

## 2024-08-15 DIAGNOSIS — N39498 Other specified urinary incontinence: Secondary | ICD-10-CM | POA: Diagnosis not present

## 2024-08-30 DIAGNOSIS — I69393 Ataxia following cerebral infarction: Secondary | ICD-10-CM | POA: Diagnosis not present

## 2024-08-30 DIAGNOSIS — Z466 Encounter for fitting and adjustment of urinary device: Secondary | ICD-10-CM | POA: Diagnosis not present

## 2024-08-30 DIAGNOSIS — I10 Essential (primary) hypertension: Secondary | ICD-10-CM | POA: Diagnosis not present

## 2024-08-30 DIAGNOSIS — G301 Alzheimer's disease with late onset: Secondary | ICD-10-CM | POA: Diagnosis not present

## 2024-08-30 DIAGNOSIS — R32 Unspecified urinary incontinence: Secondary | ICD-10-CM | POA: Diagnosis not present

## 2024-08-30 DIAGNOSIS — F0150 Vascular dementia without behavioral disturbance: Secondary | ICD-10-CM | POA: Diagnosis not present

## 2024-08-30 DIAGNOSIS — M810 Age-related osteoporosis without current pathological fracture: Secondary | ICD-10-CM | POA: Diagnosis not present

## 2024-08-30 DIAGNOSIS — F0280 Dementia in other diseases classified elsewhere without behavioral disturbance: Secondary | ICD-10-CM | POA: Diagnosis not present

## 2024-08-30 DIAGNOSIS — I700 Atherosclerosis of aorta: Secondary | ICD-10-CM | POA: Diagnosis not present
